# Patient Record
Sex: Female | Born: 1980 | Race: White | Hispanic: No | Smoking: Current every day smoker
Health system: Southern US, Community
[De-identification: ages and names within clinical notes are randomized; demographics above are authoritative.]

## PROBLEM LIST (undated history)

## (undated) DIAGNOSIS — F32A Depression, unspecified: Secondary | ICD-10-CM

## (undated) DIAGNOSIS — Z9289 Personal history of other medical treatment: Secondary | ICD-10-CM

## (undated) DIAGNOSIS — K922 Gastrointestinal hemorrhage, unspecified: Secondary | ICD-10-CM

## (undated) DIAGNOSIS — I10 Essential (primary) hypertension: Secondary | ICD-10-CM

## (undated) DIAGNOSIS — R519 Headache, unspecified: Secondary | ICD-10-CM

## (undated) DIAGNOSIS — Z87442 Personal history of urinary calculi: Secondary | ICD-10-CM

## (undated) DIAGNOSIS — R51 Headache: Secondary | ICD-10-CM

## (undated) DIAGNOSIS — K219 Gastro-esophageal reflux disease without esophagitis: Secondary | ICD-10-CM

## (undated) DIAGNOSIS — N3010 Interstitial cystitis (chronic) without hematuria: Secondary | ICD-10-CM

## (undated) DIAGNOSIS — J189 Pneumonia, unspecified organism: Secondary | ICD-10-CM

## (undated) DIAGNOSIS — M545 Low back pain, unspecified: Secondary | ICD-10-CM

## (undated) DIAGNOSIS — F419 Anxiety disorder, unspecified: Secondary | ICD-10-CM

## (undated) DIAGNOSIS — D649 Anemia, unspecified: Secondary | ICD-10-CM

## (undated) DIAGNOSIS — K264 Chronic or unspecified duodenal ulcer with hemorrhage: Secondary | ICD-10-CM

## (undated) DIAGNOSIS — E039 Hypothyroidism, unspecified: Secondary | ICD-10-CM

## (undated) DIAGNOSIS — Z8719 Personal history of other diseases of the digestive system: Secondary | ICD-10-CM

## (undated) DIAGNOSIS — E079 Disorder of thyroid, unspecified: Secondary | ICD-10-CM

## (undated) DIAGNOSIS — G8929 Other chronic pain: Secondary | ICD-10-CM

## (undated) DIAGNOSIS — N200 Calculus of kidney: Secondary | ICD-10-CM

## (undated) DIAGNOSIS — F329 Major depressive disorder, single episode, unspecified: Secondary | ICD-10-CM

## (undated) HISTORY — PX: HERNIA REPAIR: SHX51

## (undated) HISTORY — DX: Chronic or unspecified duodenal ulcer with hemorrhage: K26.4

---
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
## 2001-08-19 ENCOUNTER — Emergency Department (HOSPITAL_COMMUNITY): Admission: EM | Admit: 2001-08-19 | Discharge: 2001-08-19 | Payer: Self-pay | Admitting: Emergency Medicine

## 2001-09-22 ENCOUNTER — Ambulatory Visit (HOSPITAL_COMMUNITY): Admission: RE | Admit: 2001-09-22 | Discharge: 2001-09-22 | Payer: Self-pay | Admitting: Family Medicine

## 2001-09-22 ENCOUNTER — Encounter: Payer: Self-pay | Admitting: Family Medicine

## 2001-12-22 ENCOUNTER — Encounter: Payer: Self-pay | Admitting: *Deleted

## 2001-12-22 ENCOUNTER — Emergency Department (HOSPITAL_COMMUNITY): Admission: EM | Admit: 2001-12-22 | Discharge: 2001-12-22 | Payer: Self-pay | Admitting: Emergency Medicine

## 2002-03-01 HISTORY — PX: LITHOTRIPSY: SUR834

## 2002-03-01 HISTORY — PX: BLADDER INSTILLATION: SUR156

## 2002-08-27 ENCOUNTER — Encounter: Payer: Self-pay | Admitting: Emergency Medicine

## 2002-08-27 ENCOUNTER — Emergency Department (HOSPITAL_COMMUNITY): Admission: EM | Admit: 2002-08-27 | Discharge: 2002-08-27 | Payer: Self-pay | Admitting: Emergency Medicine

## 2002-10-17 ENCOUNTER — Encounter: Payer: Self-pay | Admitting: Urology

## 2002-10-17 ENCOUNTER — Ambulatory Visit (HOSPITAL_COMMUNITY): Admission: RE | Admit: 2002-10-17 | Discharge: 2002-10-17 | Payer: Self-pay | Admitting: Urology

## 2002-11-01 ENCOUNTER — Ambulatory Visit (HOSPITAL_BASED_OUTPATIENT_CLINIC_OR_DEPARTMENT_OTHER): Admission: RE | Admit: 2002-11-01 | Discharge: 2002-11-01 | Payer: Self-pay | Admitting: Urology

## 2002-11-01 ENCOUNTER — Encounter: Payer: Self-pay | Admitting: Urology

## 2003-04-25 ENCOUNTER — Ambulatory Visit (HOSPITAL_COMMUNITY): Admission: RE | Admit: 2003-04-25 | Discharge: 2003-04-25 | Payer: Self-pay | Admitting: Urology

## 2003-04-25 ENCOUNTER — Encounter (INDEPENDENT_AMBULATORY_CARE_PROVIDER_SITE_OTHER): Payer: Self-pay | Admitting: Specialist

## 2003-04-25 ENCOUNTER — Ambulatory Visit (HOSPITAL_BASED_OUTPATIENT_CLINIC_OR_DEPARTMENT_OTHER): Admission: RE | Admit: 2003-04-25 | Discharge: 2003-04-25 | Payer: Self-pay | Admitting: Urology

## 2003-04-28 ENCOUNTER — Emergency Department (HOSPITAL_COMMUNITY): Admission: EM | Admit: 2003-04-28 | Discharge: 2003-04-28 | Payer: Self-pay | Admitting: Emergency Medicine

## 2004-05-11 ENCOUNTER — Ambulatory Visit (HOSPITAL_COMMUNITY): Admission: RE | Admit: 2004-05-11 | Discharge: 2004-05-11 | Payer: Self-pay | Admitting: Obstetrics & Gynecology

## 2004-05-13 ENCOUNTER — Inpatient Hospital Stay (HOSPITAL_COMMUNITY): Admission: RE | Admit: 2004-05-13 | Discharge: 2004-05-15 | Payer: Self-pay | Admitting: Obstetrics & Gynecology

## 2008-05-17 HISTORY — PX: TONSILLECTOMY: SUR1361

## 2008-05-17 HISTORY — PX: NASAL SINUS SURGERY: SHX719

## 2008-06-28 ENCOUNTER — Ambulatory Visit (HOSPITAL_COMMUNITY): Payer: Self-pay | Admitting: Psychology

## 2009-02-27 ENCOUNTER — Emergency Department (HOSPITAL_COMMUNITY): Admission: EM | Admit: 2009-02-27 | Discharge: 2009-02-27 | Payer: Self-pay | Admitting: Emergency Medicine

## 2010-06-01 LAB — URINALYSIS, ROUTINE W REFLEX MICROSCOPIC
Bilirubin Urine: NEGATIVE
Glucose, UA: NEGATIVE mg/dL
Ketones, ur: NEGATIVE mg/dL
Leukocytes, UA: NEGATIVE
Nitrite: NEGATIVE
Protein, ur: NEGATIVE mg/dL
Specific Gravity, Urine: <1.005 — ABNORMAL LOW (ref 1.005–1.030)
Urobilinogen, UA: 0.2 mg/dL (ref 0.0–1.0)
pH: 6.5 (ref 5.0–8.0)

## 2010-06-01 LAB — URINE MICROSCOPIC-ADD ON

## 2010-07-17 NOTE — Discharge Summary (Signed)
NAMERAAGA, MAEDER NO.:  [id number]   MEDICAL RECORD NO.:  [id number]          PATIENT TYPE:  INP   LOCATION:  A427                          FACILITY:  [facility]   PHYSICIAN:  Lazaro Arms, M.D.   DATE OF BIRTH:  05-29-1980   DATE OF ADMISSION:  05/13/2004  DATE OF DISCHARGE:  03/17/2006LH                                 DISCHARGE SUMMARY   DISCHARGE DIAGNOSES:  1.  Status post primary cesarean section.  2.  Unremarkable postoperative course.   PROCEDURE:  Primary cesarean section.   Please refer to the transcribed history and physical as well as the  operative note for details on admission to the hospital.   HOSPITAL COURSE:  The patient was admitted, underwent a primary cesarean  section because of fetal pelvic disproportion. The fetus was not in the  pelvis at all. As a result, we proceeded with that. She had a normal  intraoperative course. Please refer to the operative note for details.  Postoperatively, the patient did well, she tolerated clear liquids and a  regular diet. She voided without symptoms and was ambulatory. Her incision  was clean, dry and intact. She tolerated the transition to oral pain  medicine and had normal bowel function. She was discharged to home on  postoperative day #2 in good stable condition to follow in the office next  week to have staples removed, Steri-Strips placed. She was given  instructions and precautions for return prior to that time as well as a  prescription for  Tylox.      LHE/MEDQ  D:  07/22/2004  T:  07/22/2004  Job:  [id number]

## 2010-07-17 NOTE — Op Note (Signed)
NAMEAUDREY, Kimberly Weiss NO.:  [id number]   MEDICAL RECORD NO.:  [id number]          PATIENT TYPE:  INP   LOCATION:  A427                          FACILITY:  [facility]   PHYSICIAN:  Lazaro Arms, M.D.   DATE OF BIRTH:  Sep 02, 1980   DATE OF PROCEDURE:  05/13/2004  DATE OF DISCHARGE:                                 OPERATIVE REPORT   PREOPERATIVE DIAGNOSES:  1.  Intrauterine pregnancy at [redacted] weeks gestation.  2.  Inadequate pelvis.  3.  Preoperative concern over fetal lip and palate clefting.   POSTOPERATIVE DIAGNOSIS:  1.  Intrauterine pregnancy at [redacted] weeks gestation.  2.  Inadequate pelvis.  3.  Preoperative concern over fetal lip and palate clefting.  No evidence of      any soft tissue or palatal clefting.   OPERATION:  Primary cesarean section.   SURGEON:  Lazaro Arms, M.D.   ANESTHESIA:  Spinal.   FINDINGS:  Delivered a viable female infant with Apgars of 9 and 9, weighing 7  pounds, 13 ounces.  Did have a 14-1/2 inch head circumference that was  probably the cause of it not fitting down into the pelvis.  There was no  clefting at the time of delivery.  The uterus, tubes, and ovaries were  normal.  Placenta was normal.  Cord gas and cord blood were sent.  Dr. Milford Cage  was in attendance for routine neonatal resuscitation.   DESCRIPTION OF PROCEDURE:  Patient was taken to the operating room and  placed in a sitting position, where she underwent a spinal anesthetic.  She  was then placed in a supine position, prepped and draped in the usual  sterile fashion.  A Pfannenstiel skin incision was made and carried down  sharply to the rectus fascia, which was scored in the midline and extended  laterally.  The fascia was taken off the muscle superiorly and inferiorly  without difficulty.  The muscles were divided, and the peritoneal cavity was  entered.  A bladder blade was placed.  A vesicouterine serosal flap was  created.  A low transverse hysterotomy incision was  made, and over this  incision was delivered a viable female infant with Apgars of 9 and 9.  Weight  7 pounds 12 ounces.  There was a three-vessel cord.  Cord blood and cord gas  was sent.  The infant was handed to Dr. Milford Cage, who was in attendance for  routine neonatal resuscitation.  Cord blood and cord gas was sent.  The  placenta was delivered spontaneously.  The uterus was closed in two layers,  the first being running interlocking layer, the second being an embrocating  layer.  The uterus clamped down well with Pitocin and massage.  The uterus  was replaced.  The peritoneal cavity and both pericolic gutters were  irrigated using warm normal saline.  The muscles were reapproximated  loosely.  The fascia closed using 0  Vicryl running.  The subcutaneous tissue was made hemostatic and irrigated.  The skin was closed using skin staples.  The patient tolerated the procedure  well.  She experienced 500 cc of blood loss and was taken  to the recovery  room in good and stable condition.  All counts were correct x3.      LHE/MEDQ  D:  05/13/2004  T:  05/13/2004  Job:  [id number]

## 2010-07-17 NOTE — Op Note (Signed)
Kimberly Weiss, Kimberly Weiss                       ACCOUNT NO.:  [id number]   MEDICAL RECORD NO.:  [id number]                   PATIENT TYPE:  AMB   LOCATION:  NESC                                 FACILITY:  [facility]   PHYSICIAN:  Sigmund I. Patsi Sears, M.D.         DATE OF BIRTH:  1980-08-09   DATE OF PROCEDURE:  04/25/2003  DATE OF DISCHARGE:                                 OPERATIVE REPORT   PREOPERATIVE DIAGNOSIS:  Interstitial cystitis with left flank pain.   POSTOPERATIVE DIAGNOSIS:  Interstitial cystitis with left flank pain.   OPERATION:  1. Cystourethroscopy.  2. Bilateral retrograde pyelogram with interpretation.  3. Hydrodistention of the bladder (650-700 mL).  4. Cold cup biopsy of the bladder.  5. Cauterization of biopsy sites.  6. Insertion of Clorpactin for three minutes.  7. Insertion of Marcaine and Pyridium.  8. Injection of Marcaine and Kenalog in the subtrigonal space.   SURGEON:  Sigmund I. Patsi Sears, M.D.   ANESTHESIA:  General LMA.   PREPARATION:  After appropriate preanesthesia, the patient was brought to  the operating room and placed on the operating table in the dorsal supine  position, where general LMA was introduced.  She was then replaced in a  dorsal lithotomy position, where the pubis was prepped with Betadine  solution and draped in the usual fashion.   REVIEW OF HISTORY:  This 30 year old female complains of left flank pain,  with a history of a 5 mm left renal stone post lithotripsy.  The patient was  placed on suppression, trimethoprim, for recurrent infections and complains  of recurrent left flank pain, suprapubic tenderness, with urgency and  frequency.  There is no definite evidence that the patient has had  discomfort with intercourse but does not relate that to her symptoms.  She  is now for cystoscopy, HOD, bladder biopsy to rule out IC.   DESCRIPTION OF PROCEDURE:  Cystourethroscopy revealed a normal-appearing  urethra.  The bladder  base was normal, and there was no evidence of bladder  stone, tumor, or diverticular formation.   The bladder held only 650 mL under anesthesia, and then this was  hydrodistended to 700 mL.  Following this, bilateral retrograde pyelograms  were performed which were normal.  No evidence of stone, tumor, or  hydronephrosis.   Biopsies of the bladder were taken, and each biopsy site was cauterized.  Clorpactin was inserted in the bladder for three minutes and drained.  Marcaine and Pyridium was then inserted in the bladder and left in the  bladder.  Marcaine and Kenalog was then injected in the subtrigonal space.  The patient was then awakened and taken to the recovery room in good  condition.  Sigmund I. Patsi Sears, M.D.    SIT/MEDQ  D:  04/25/2003  T:  04/25/2003  Job:  [id number]

## 2011-06-09 ENCOUNTER — Other Ambulatory Visit: Payer: Self-pay | Admitting: Family Medicine

## 2011-06-09 DIAGNOSIS — M545 Low back pain, unspecified: Secondary | ICD-10-CM

## 2011-06-15 ENCOUNTER — Other Ambulatory Visit (HOSPITAL_COMMUNITY): Payer: Self-pay

## 2011-12-12 ENCOUNTER — Encounter (HOSPITAL_COMMUNITY): Payer: Self-pay | Admitting: Adult Health

## 2011-12-12 ENCOUNTER — Emergency Department (HOSPITAL_COMMUNITY)
Admission: EM | Admit: 2011-12-12 | Discharge: 2011-12-12 | Disposition: A | Discharge status: Home / Self Care | Payer: Medicaid Other | Attending: Emergency Medicine | Admitting: Emergency Medicine

## 2011-12-12 DIAGNOSIS — M79609 Pain in unspecified limb: Secondary | ICD-10-CM | POA: Insufficient documentation

## 2011-12-12 DIAGNOSIS — M545 Low back pain, unspecified: Secondary | ICD-10-CM | POA: Insufficient documentation

## 2011-12-12 DIAGNOSIS — N289 Disorder of kidney and ureter, unspecified: Secondary | ICD-10-CM | POA: Insufficient documentation

## 2011-12-12 DIAGNOSIS — Z79899 Other long term (current) drug therapy: Secondary | ICD-10-CM | POA: Insufficient documentation

## 2011-12-12 DIAGNOSIS — M549 Dorsalgia, unspecified: Secondary | ICD-10-CM

## 2011-12-12 DIAGNOSIS — E039 Hypothyroidism, unspecified: Secondary | ICD-10-CM | POA: Insufficient documentation

## 2011-12-12 DIAGNOSIS — F17200 Nicotine dependence, unspecified, uncomplicated: Secondary | ICD-10-CM | POA: Insufficient documentation

## 2011-12-12 HISTORY — DX: Interstitial cystitis (chronic) without hematuria: N30.10

## 2011-12-12 HISTORY — DX: Disorder of thyroid, unspecified: E07.9

## 2011-12-12 MED ORDER — HYDROMORPHONE HCL PF 1 MG/ML IJ SOLN
1.0000 mg | Freq: Once | INTRAMUSCULAR | Status: AC
  Administered 2011-12-12: 1 mg via INTRAMUSCULAR
  Filled 2011-12-12: qty 1

## 2011-12-12 MED ORDER — DIAZEPAM 5 MG PO TABS: 5.0000 mg | ORAL_TABLET | Freq: Four times a day (QID) | ORAL | Status: DC | PRN

## 2011-12-12 MED ORDER — HYDROCODONE-ACETAMINOPHEN 5-325 MG PO TABS: 1.0000 | ORAL_TABLET | ORAL | Status: DC | PRN

## 2011-12-12 MED ORDER — DIAZEPAM 5 MG/ML IJ SOLN
10.0000 mg | Freq: Once | INTRAMUSCULAR | Status: AC
  Administered 2011-12-12: 10 mg via INTRAMUSCULAR
  Filled 2011-12-12: qty 2

## 2011-12-12 NOTE — ED Notes (Signed)
The pt has chronic back pain and since Friday she has had more  Back pain then earlier tonight she lifted her 32 year old son upright and since then she has had a burning pain  In her lower  Spine.  Pain with standing and sitting

## 2011-12-12 NOTE — ED Notes (Signed)
C/o back pain that has been ongoing since march. Has been seen for it by Primary care doctor. Tonight she picked up her 32 year old like a baby because he was hurt, after picking child up c/o feeling a tear and ripping sensation in lower back. Pain is worse with movement and ambulation. Nothing makes pain better.  Pain radiates down back and up back. C/o inability to stand up with the pain.

## 2011-12-12 NOTE — ED Provider Notes (Signed)
History     CSN: [id number]  Arrival date & time 12/12/11  5621   First MD Initiated Contact with Patient 12/12/11 0100      Chief Complaint  Patient presents with  . Back Pain   HPI  History provided by the patient. Patient is a 31 year old female who presents with complaints of worsening low back pain. Patient states that she has had off-and-on low back pain beginning earlier this year. She reports she has had severe episodes of back pain that radiates into her left extremity and cause difficulty moving around. Patient is seen have been a practice in [place] and states that she has been seen multiple times for these symptoms. She has been treated with pain medications for her symptoms and muscle relaxants. She states her primary care provider was unable to perform MRI because of her insurance but that they are planning and hoping to perform this in the future. She has not been referred to any back or spine specialist. Patient states that she jumped up off the couch earlier in the evening last night to help pick up her 32-year-old son and states she felt a tearing pain in her low back. This has reproduced her severe pain symptoms with radiation into her left buttocks and thigh. She denies any numbness or weakness in lower extremities. Denies any urinary or fecal incontinence, urinary retention or perineal numbness. Patient has not used anything to treat her symptoms. Patient states she was previously on oxycodone but rather this medication. She was also put on gabapentin but did not like the side effects from this medicine.    Past Medical History  Diagnosis Date  . Renal disorder   . Interstitial cystitis   . Thyroid disease     History reviewed. No pertinent past surgical history.  History reviewed. No pertinent family history.  History  Substance Use Topics  . Smoking status: Current Every Day Smoker  . Smokeless tobacco: Not on file  . Alcohol Use: Yes    OB History    Grav Para Term Preterm Abortions TAB SAB Ect Mult Living                  Review of Systems  Constitutional: Negative for fever and unexpected weight change.  HENT: Negative for neck pain.   Respiratory: Negative for shortness of breath.   Cardiovascular: Negative for chest pain.  Genitourinary: Negative for dysuria, frequency, hematuria and flank pain.  Musculoskeletal: Positive for back pain.  Neurological: Negative for weakness and numbness.    Allergies  Review of patient's allergies indicates no known allergies.  Home Medications   Current Outpatient Rx  Name Route Sig Dispense Refill  . AMITRIPTYLINE HCL 50 MG PO TABS Oral Take 50 mg by mouth at bedtime.    Marland Kitchen EXCEDRIN PO Oral Take 2 tablets by mouth 2 (two) times daily as needed. For pain    . CLONAZEPAM 1 MG PO TABS Oral Take 1 mg by mouth 3 (three) times daily as needed. For panic attacks    . DULOXETINE HCL 60 MG PO CPEP Oral Take 60 mg by mouth daily.    Marland Kitchen GABAPENTIN 300 MG PO CAPS Oral Take 300 mg by mouth daily as needed. For pain    . LEVOTHYROXINE SODIUM 50 MCG PO TABS Oral Take 50 mcg by mouth daily.    Marland Kitchen LORATADINE 10 MG PO TABS Oral Take 10 mg by mouth daily as needed. For seasonal allergy symptoms  BP 119/75  Temp 98.1 F (36.7 C) (Oral)  Resp 18  SpO2 98% heart rate 104  Physical Exam  Nursing note and vitals reviewed. Constitutional: She is oriented to person, place, and time. She appears well-developed and well-nourished. No distress.  HENT:  Head: Normocephalic.  Neck: Normal range of motion. Neck supple.  Cardiovascular: Normal rate and regular rhythm.   Pulmonary/Chest: Effort normal and breath sounds normal. No respiratory distress. She has no wheezes. She has no rales.  Abdominal: Soft. There is no tenderness. There is no rebound.       No CVA tenderness  Musculoskeletal:       Lumbar back: She exhibits decreased range of motion and tenderness. She exhibits no bony tenderness, no swelling,  no edema and no deformity.       Back:       Pain with forward flexion. Patient also has some difficulty with other range of motion.  Neurological: She is alert and oriented to person, place, and time. She has normal strength. No sensory deficit. Gait normal.  Skin: Skin is warm and dry. No rash noted.  Psychiatric: She has a normal mood and affect. Her behavior is normal.    ED Course  Procedures       1. Back pain       MDM  1:40 AM patient seen and evaluated. Patient appears uncomfortable but in no acute distress. No history of significant injury. No red flag symptoms. No indications for imaging tonight.       Angus Seller, [place] 12/12/11 2202

## 2011-12-13 NOTE — ED Provider Notes (Signed)
Medical screening examination/treatment/procedure(s) were performed by non-physician practitioner and as supervising physician I was immediately available for consultation/collaboration.  Olivia Mackie, MD 12/13/11 [phone]

## 2012-01-18 ENCOUNTER — Other Ambulatory Visit (HOSPITAL_COMMUNITY): Payer: Self-pay | Admitting: Neurosurgery

## 2012-01-18 DIAGNOSIS — M545 Low back pain, unspecified: Secondary | ICD-10-CM

## 2012-01-20 ENCOUNTER — Ambulatory Visit (HOSPITAL_COMMUNITY)
Admission: RE | Admit: 2012-01-20 | Discharge: 2012-01-20 | Disposition: A | Discharge status: Home / Self Care | Payer: Medicaid Other | Source: Ambulatory Visit | Attending: Neurosurgery | Admitting: Neurosurgery

## 2012-01-20 DIAGNOSIS — M545 Low back pain, unspecified: Secondary | ICD-10-CM

## 2012-01-20 DIAGNOSIS — M4310 Spondylolisthesis, site unspecified: Secondary | ICD-10-CM | POA: Insufficient documentation

## 2012-01-20 DIAGNOSIS — M47817 Spondylosis without myelopathy or radiculopathy, lumbosacral region: Secondary | ICD-10-CM | POA: Insufficient documentation

## 2012-05-21 ENCOUNTER — Other Ambulatory Visit: Payer: Self-pay | Admitting: Nurse Practitioner

## 2012-05-22 ENCOUNTER — Encounter: Payer: Self-pay | Admitting: *Deleted

## 2012-05-22 DIAGNOSIS — E038 Other specified hypothyroidism: Secondary | ICD-10-CM

## 2012-05-22 DIAGNOSIS — N3010 Interstitial cystitis (chronic) without hematuria: Secondary | ICD-10-CM | POA: Insufficient documentation

## 2012-05-22 DIAGNOSIS — E039 Hypothyroidism, unspecified: Secondary | ICD-10-CM | POA: Insufficient documentation

## 2012-06-08 ENCOUNTER — Other Ambulatory Visit: Payer: Self-pay | Admitting: Nurse Practitioner

## 2012-06-08 ENCOUNTER — Encounter: Payer: Self-pay | Admitting: Nurse Practitioner

## 2012-06-08 ENCOUNTER — Ambulatory Visit (INDEPENDENT_AMBULATORY_CARE_PROVIDER_SITE_OTHER): Payer: Medicaid Other | Admitting: Nurse Practitioner

## 2012-06-08 VITALS — BP 124/80 | Temp 98.7°F | Ht 63.0 in | Wt 160.3 lb

## 2012-06-08 DIAGNOSIS — N949 Unspecified condition associated with female genital organs and menstrual cycle: Secondary | ICD-10-CM

## 2012-06-08 DIAGNOSIS — N925 Other specified irregular menstruation: Secondary | ICD-10-CM

## 2012-06-08 DIAGNOSIS — N938 Other specified abnormal uterine and vaginal bleeding: Secondary | ICD-10-CM

## 2012-06-08 LAB — POCT HEMOGLOBIN: Hemoglobin: 13.2 g/dL (ref 12.2–16.2)

## 2012-06-08 MED ORDER — BACLOFEN 10 MG PO TABS: 10.0000 mg | ORAL_TABLET | Freq: Three times a day (TID) | ORAL | Status: DC

## 2012-06-08 MED ORDER — HYDROCODONE-ACETAMINOPHEN 10-325 MG PO TABS: 1.0000 | ORAL_TABLET | Freq: Four times a day (QID) | ORAL | Status: DC | PRN

## 2012-06-08 MED ORDER — MEGESTROL ACETATE 40 MG PO TABS: ORAL_TABLET | ORAL | Status: DC

## 2012-06-10 ENCOUNTER — Encounter: Payer: Self-pay | Admitting: Nurse Practitioner

## 2012-06-10 DIAGNOSIS — N938 Other specified abnormal uterine and vaginal bleeding: Secondary | ICD-10-CM | POA: Insufficient documentation

## 2012-06-10 NOTE — Assessment & Plan Note (Signed)
OTC ibuprofen as directed. Also prescribed baclofen and Megace. Call back next week if bleeding persists. Also refill on her hydrocodone/APAP as directed. Patient to notify our office if she wishes to switch her from her birth control with her next Depo-Provera injection.

## 2012-06-10 NOTE — Progress Notes (Signed)
Subjective:  Presents with complaints of breakthrough bleeding on Depo-Provera. First week she had brown spotting. Second week some bright red bleeding with cramping and bleeding. This has improved. No vaginal discharge. No fevers. Same sexual partner. This was her first injection of Depo-Provera, next dose due early May.  Objective:   BP 124/80  Temp(Src) 98.7 F (37.1 C) (Oral)  Ht 5\' 3"  (1.6 m)  Wt 160 lb 5 oz (72.717 kg)  BMI 28.41 kg/m2 NAD. Alert, oriented. Lungs clear. Heart regular rate rhythm. Hemoglobin 13.2.

## 2012-06-12 ENCOUNTER — Telehealth: Payer: Self-pay | Admitting: Family Medicine

## 2012-06-12 NOTE — Telephone Encounter (Signed)
Please verify dosage etc.on paper chart.  Also we do not prescribe 90 d supply on controlled meds.

## 2012-06-13 ENCOUNTER — Other Ambulatory Visit: Payer: Self-pay | Admitting: Family Medicine

## 2012-06-13 NOTE — Telephone Encounter (Signed)
Last recorded dosage in paper chart was on 12/03/2011 for Klonopin 1 mg #90 one TID prn

## 2012-06-15 ENCOUNTER — Other Ambulatory Visit: Payer: Self-pay | Admitting: Family Medicine

## 2012-06-16 ENCOUNTER — Other Ambulatory Visit: Payer: Self-pay | Admitting: Nurse Practitioner

## 2012-06-16 MED ORDER — AMITRIPTYLINE HCL 25 MG PO TABS: 50.0000 mg | ORAL_TABLET | Freq: Every day | ORAL | Status: DC

## 2012-06-16 MED ORDER — CLONAZEPAM 1 MG PO TABS: ORAL_TABLET | ORAL | Status: DC

## 2012-06-20 ENCOUNTER — Other Ambulatory Visit: Payer: Self-pay | Admitting: Family Medicine

## 2012-06-29 ENCOUNTER — Telehealth: Payer: Self-pay | Admitting: Family Medicine

## 2012-06-29 MED ORDER — MEGESTROL ACETATE 40 MG PO TABS: 40.0000 mg | ORAL_TABLET | Freq: Every day | ORAL | Status: DC

## 2012-06-29 NOTE — Telephone Encounter (Signed)
Pt is requesting a "fluid" pill to help with the water weight she has. Megace has been ordered, app made for depo provera shot on weds or thurs next week

## 2012-06-29 NOTE — Telephone Encounter (Signed)
Seen in office 04/19/12 for vaginal bleeding

## 2012-06-29 NOTE — Telephone Encounter (Signed)
Patient would like to speak with you about the bleeding that she is having. The megestrol stops the bleeding, but starts bleeding again when she is off.

## 2012-06-29 NOTE — Telephone Encounter (Signed)
Restart Megace 40 mg qd x one week.  If she wants to try Depo Provera, recommend getting it next wed-fri.  If not, will need to choose another option.  Not unusual to see bleeding 1st injection.

## 2012-06-30 ENCOUNTER — Other Ambulatory Visit: Payer: Self-pay | Admitting: Nurse Practitioner

## 2012-06-30 MED ORDER — HYDROCHLOROTHIAZIDE 25 MG PO TABS: ORAL_TABLET | ORAL | Status: DC

## 2012-07-05 ENCOUNTER — Ambulatory Visit (INDEPENDENT_AMBULATORY_CARE_PROVIDER_SITE_OTHER): Payer: Medicaid Other

## 2012-07-05 DIAGNOSIS — Z3049 Encounter for surveillance of other contraceptives: Secondary | ICD-10-CM

## 2012-07-05 DIAGNOSIS — Z3042 Encounter for surveillance of injectable contraceptive: Secondary | ICD-10-CM

## 2012-07-05 MED ORDER — MEDROXYPROGESTERONE ACETATE 150 MG/ML IM SUSP
150.0000 mg | Freq: Once | INTRAMUSCULAR | Status: AC
  Administered 2012-07-05: 150 mg via INTRAMUSCULAR

## 2012-07-26 ENCOUNTER — Telehealth: Payer: Self-pay | Admitting: Nurse Practitioner

## 2012-07-26 NOTE — Telephone Encounter (Signed)
Last Depo shot was 07/05/12.  Having trouble with constant bleeding, sometimes its heavy other days it is light.  Does not want the last medication that was given to her it makes her sick.  Wants to know if you can do a hormone panel or some type of blood work to see what may be going on with patient.  Please give patient call.

## 2012-07-27 ENCOUNTER — Telehealth: Payer: Self-pay | Admitting: *Deleted

## 2012-07-27 ENCOUNTER — Other Ambulatory Visit: Payer: Self-pay | Admitting: Nurse Practitioner

## 2012-07-27 NOTE — Telephone Encounter (Signed)
This can happen with Depo Provera as we discussed, especially when you first start taking it.  I will send in a different medicine to see if this will help.  Labwork really is of limited benefit at this time if you are checking hormones.  Women should come off hormones for a month (including pills, etc) before hormone levels are checked.

## 2012-07-27 NOTE — Telephone Encounter (Signed)
Pt has had Depo Provera injection May 7th, 2014.

## 2012-07-27 NOTE — Telephone Encounter (Signed)
I reviewed our last visit.  It looks like patient did not get Depo in early May.  Please verify what she is taking now.  If no hormones, we can order labs.

## 2012-07-27 NOTE — Telephone Encounter (Signed)
Pt stated she had gotten to depo provera shot Jul 05, 2012

## 2012-07-31 ENCOUNTER — Other Ambulatory Visit: Payer: Self-pay | Admitting: Nurse Practitioner

## 2012-07-31 MED ORDER — ESTRADIOL 1 MG PO TABS: 1.0000 mg | ORAL_TABLET | Freq: Every day | ORAL | Status: DC

## 2012-07-31 NOTE — Telephone Encounter (Signed)
Kimberly Weiss, I don't understand last note on this patient.  Please clarify.  Kimberly Weiss

## 2012-07-31 NOTE — Telephone Encounter (Signed)
Sent in Rx for low dose estrogen to [facility].  Bleeding should stop within the next 2 weeks.  Call back if worsens or persists.

## 2012-07-31 NOTE — Telephone Encounter (Signed)
See other message- Message done

## 2012-08-01 ENCOUNTER — Telehealth: Payer: Self-pay

## 2012-08-01 NOTE — Telephone Encounter (Signed)
Notified patient that low dose estrogen was sent in to [facility]. Bleeding should stop in two weeks. Call back if symptoms worsen or persist. Patient verbalized understanding.

## 2012-08-08 ENCOUNTER — Telehealth: Payer: Self-pay | Admitting: Family Medicine

## 2012-08-08 NOTE — Telephone Encounter (Signed)
Patient may have one refill but it is necessary for her to schedule for a followup office visit in July before further refills will be issued.

## 2012-08-08 NOTE — Telephone Encounter (Signed)
RX called into [facility]. Patient notified and to schedule office visit.

## 2012-08-08 NOTE — Telephone Encounter (Signed)
Would like a prescription for pain medication Vicodin 10/325.  [facility] in [place].  Please call patient.  Thanks

## 2012-08-16 ENCOUNTER — Other Ambulatory Visit: Payer: Self-pay | Admitting: Nurse Practitioner

## 2012-08-17 ENCOUNTER — Other Ambulatory Visit: Payer: Self-pay | Admitting: *Deleted

## 2012-08-17 ENCOUNTER — Other Ambulatory Visit: Payer: Self-pay | Admitting: Family Medicine

## 2012-08-17 MED ORDER — LEVOTHYROXINE SODIUM 50 MCG PO TABS: 50.0000 ug | ORAL_TABLET | Freq: Every day | ORAL | Status: DC

## 2012-09-07 ENCOUNTER — Ambulatory Visit: Payer: Medicaid Other | Admitting: Family Medicine

## 2012-09-14 ENCOUNTER — Other Ambulatory Visit: Payer: Self-pay | Admitting: Family Medicine

## 2012-10-02 DIAGNOSIS — Z0289 Encounter for other administrative examinations: Secondary | ICD-10-CM

## 2012-10-23 ENCOUNTER — Telehealth: Payer: Self-pay | Admitting: Family Medicine

## 2012-10-23 ENCOUNTER — Other Ambulatory Visit: Payer: Self-pay | Admitting: *Deleted

## 2012-10-23 MED ORDER — CLONAZEPAM 1 MG PO TABS: ORAL_TABLET | ORAL | Status: DC

## 2012-10-23 NOTE — Telephone Encounter (Signed)
One refill, keep ov

## 2012-10-23 NOTE — Telephone Encounter (Signed)
Med called in and pt notified 

## 2012-10-23 NOTE — Telephone Encounter (Signed)
Patient needs Rx for generic klonopin desperately [id number]. She has an appointment on 10/31/12. Please call patient.

## 2012-10-31 ENCOUNTER — Ambulatory Visit: Payer: Medicaid Other | Admitting: Family Medicine

## 2012-11-02 ENCOUNTER — Encounter: Payer: Self-pay | Admitting: Nurse Practitioner

## 2012-11-02 ENCOUNTER — Ambulatory Visit (INDEPENDENT_AMBULATORY_CARE_PROVIDER_SITE_OTHER): Payer: Medicaid Other | Admitting: Nurse Practitioner

## 2012-11-02 VITALS — BP 100/72 | Ht 63.0 in | Wt 164.0 lb

## 2012-11-02 DIAGNOSIS — F329 Major depressive disorder, single episode, unspecified: Secondary | ICD-10-CM

## 2012-11-02 DIAGNOSIS — F419 Anxiety disorder, unspecified: Secondary | ICD-10-CM

## 2012-11-02 DIAGNOSIS — R52 Pain, unspecified: Secondary | ICD-10-CM

## 2012-11-02 DIAGNOSIS — F32A Anxiety disorder, unspecified: Secondary | ICD-10-CM

## 2012-11-02 DIAGNOSIS — F341 Dysthymic disorder: Secondary | ICD-10-CM

## 2012-11-02 DIAGNOSIS — Z5189 Encounter for other specified aftercare: Secondary | ICD-10-CM

## 2012-11-02 MED ORDER — CLONAZEPAM 1 MG PO TABS: ORAL_TABLET | ORAL | Status: DC

## 2012-11-02 NOTE — Progress Notes (Signed)
Subjective:  Presents for routine followup. Is currently seeing Dr. Venetia Maxon for her chronic back pain. Was prescribed hydrocodone by our office. Switched to oxycodone by their office. Currently on Percocet 10/325 every 6 hours for pain. Patient states she has to take it every 4 hours to control her pain by Dr. Venetia Maxon will not increase in number given per day. Her next visit is scheduled for 9/15 and she is going to be referred to a pain specialist at that time. Continues to have tremendous anxiety. Would like to continue her Klonopin at current dose. Compliant with medications. Our nurse called her current pharmacy patient received 120 Percocet on 8/11.  Objective:   BP 100/72  Ht 5\' 3"  (1.6 m)  Wt 164 lb (74.39 kg)  BMI 29.06 kg/m2 NAD. Alert, oriented. Mild agitation noted. Lungs clear. Heart regular rate rhythm.  Assessment:Anxiety and depression  chronic pain management  Plan: Meds ordered this encounter  Medications  . DISCONTD: clonazePAM (KLONOPIN) 1 MG tablet    Sig: 1/2-1 tab po TID prn panic attacks    Dispense:  90 tablet    Refill:  2    May refill monthly    Order Specific Question:  Supervising Provider    Answer:  Merlyn Albert [[id number]]  . DISCONTD: clonazePAM (KLONOPIN) 1 MG tablet    Sig: 1/2-1 tab po TID prn panic attacks    Dispense:  90 tablet    Refill:  2    May refill monthly    Order Specific Question:  Supervising Provider    Answer:  Merlyn Albert [[id number]]  . clonazePAM (KLONOPIN) 1 MG tablet    Sig: 1/2-1 tab po TID prn panic attacks    Dispense:  90 tablet    Refill:  2    May refill monthly    Order Specific Question:  Supervising Provider    Answer:  Merlyn Albert [[id number]]   Continue other medications as directed. Patient requesting Percocet to last her until 9/15, only has 3 pills left. Has been taking it every 4 hours despite instructions from her specialist. Explained patient that she will need to followup with her specialist regarding her pain  medication. Encouraged her to see pain specialist as planned. Recheck here in 3 months.  **Note: Several attempts were made to print Klonopin Rx; We finally printed one which was given to patient.

## 2012-11-03 ENCOUNTER — Telehealth: Payer: Self-pay | Admitting: Family Medicine

## 2012-11-03 DIAGNOSIS — F419 Anxiety disorder, unspecified: Secondary | ICD-10-CM | POA: Insufficient documentation

## 2012-11-03 DIAGNOSIS — F329 Major depressive disorder, single episode, unspecified: Secondary | ICD-10-CM | POA: Insufficient documentation

## 2012-11-03 DIAGNOSIS — F32A Anxiety disorder, unspecified: Secondary | ICD-10-CM | POA: Insufficient documentation

## 2012-11-03 NOTE — Telephone Encounter (Signed)
Currently on Baclofen for muscle cramps.

## 2012-11-03 NOTE — Assessment & Plan Note (Signed)
Continue Cymbalta as directed. Continue Klonopin as directed. Recheck in 3 months.

## 2012-11-03 NOTE — Telephone Encounter (Signed)
Patient says that since she can't have a refill on her pain medication, she is calling to find out if there is any way we can prescribe her a really strong muscle relaxer to ease her back.     [facility] [place]

## 2012-11-04 ENCOUNTER — Other Ambulatory Visit: Payer: Self-pay | Admitting: Nurse Practitioner

## 2012-11-04 MED ORDER — METHOCARBAMOL 750 MG PO TABS: 750.0000 mg | ORAL_TABLET | Freq: Three times a day (TID) | ORAL | Status: DC

## 2012-11-04 NOTE — Telephone Encounter (Signed)
Hold on Baclofen.  Will send in new Rx for muscle relaxant on Saturday.

## 2012-11-16 ENCOUNTER — Ambulatory Visit (INDEPENDENT_AMBULATORY_CARE_PROVIDER_SITE_OTHER): Payer: Medicaid Other | Admitting: Family Medicine

## 2012-11-16 ENCOUNTER — Encounter: Payer: Self-pay | Admitting: Family Medicine

## 2012-11-16 VITALS — BP 124/84 | Temp 98.8°F | Ht 63.0 in | Wt 170.0 lb

## 2012-11-16 DIAGNOSIS — J329 Chronic sinusitis, unspecified: Secondary | ICD-10-CM

## 2012-11-16 DIAGNOSIS — J310 Chronic rhinitis: Secondary | ICD-10-CM

## 2012-11-16 MED ORDER — CEFDINIR 300 MG PO CAPS: 300.0000 mg | ORAL_CAPSULE | Freq: Two times a day (BID) | ORAL | Status: DC

## 2012-11-16 MED ORDER — FLUCONAZOLE 150 MG PO TABS: ORAL_TABLET | ORAL | Status: DC

## 2012-11-16 NOTE — Progress Notes (Signed)
  Subjective:    Patient ID: Kimberly Weiss, female    DOB: 05/04/1980, 32 y.o.   MRN: [id number]  Sinusitis This is a new problem. The current episode started in the past 7 days. The pain is moderate. Associated symptoms include congestion, coughing, headaches, neck pain, sinus pressure, sneezing and a sore throat. Past treatments include acetaminophen. The treatment provided mild relief.   Hx of allergies, not taking meds.  Increased allergy symptoms lately  Feverish and achey  P;us sore throat,  Sinus pressure also, drainage leading to nausea, trouble breathing   Review of Systems  HENT: Positive for congestion, sore throat, sneezing, neck pain and sinus pressure.   Respiratory: Positive for cough.   Neurological: Positive for headaches.       Objective:   Physical Exam Alert mild malaise hydration good. HEENT moderate nasal congestion frontal tenderness neck supple. Lungs clear heart regular rate and rhythm.       Assessment & Plan:  Impression #1 rhinosinusitis plan antibiotics prescribed. Symptomatic care discussed. At end of visit patient stated she might have lost her nerve pill prescription given to her just recently. Review of [name]'s note shows that we gave her a copy. I advised patient to work harder for her missing prescriptions. Today I was seen her for an acute illness only. WSL

## 2012-12-08 ENCOUNTER — Telehealth: Payer: Self-pay | Admitting: Family Medicine

## 2012-12-08 MED ORDER — LEVOTHYROXINE SODIUM 50 MCG PO TABS: 50.0000 ug | ORAL_TABLET | Freq: Every day | ORAL | Status: DC

## 2012-12-08 NOTE — Telephone Encounter (Signed)
Rx sent electronically to pharmacy. Patient notified. 

## 2012-12-08 NOTE — Telephone Encounter (Signed)
Patient needs a refill on levothyroxine (SYNTHROID, LEVOTHROID) 50 MCG tablet [facility]  Please call Patient. Thanks

## 2012-12-11 ENCOUNTER — Other Ambulatory Visit: Payer: Self-pay | Admitting: Nurse Practitioner

## 2012-12-11 NOTE — Telephone Encounter (Signed)
3 refills 

## 2012-12-14 ENCOUNTER — Other Ambulatory Visit: Payer: Self-pay | Admitting: Nurse Practitioner

## 2012-12-14 ENCOUNTER — Telehealth: Payer: Self-pay | Admitting: Nurse Practitioner

## 2012-12-14 ENCOUNTER — Other Ambulatory Visit: Payer: Self-pay

## 2012-12-14 NOTE — Telephone Encounter (Signed)
Scott just refilled medication on 10/13. Was [facility]? If so, this has been done. Thanks.

## 2012-12-14 NOTE — Telephone Encounter (Signed)
Notified patient RX was sent in on 12/11/12 to the pharmacy. Patient stated she will check with her pharmacy.

## 2012-12-14 NOTE — Telephone Encounter (Signed)
Patient says that at the last appointment she had she received refills on klonopin 3x daily, but she states she has misplaced the prescriptions because at the time she did not have the money to get them refilled. She would like a refill of this sent to [facility].

## 2013-01-22 ENCOUNTER — Ambulatory Visit (INDEPENDENT_AMBULATORY_CARE_PROVIDER_SITE_OTHER): Payer: Medicaid Other | Admitting: Family Medicine

## 2013-01-22 ENCOUNTER — Encounter: Payer: Self-pay | Admitting: Family Medicine

## 2013-01-22 VITALS — BP 112/80 | Temp 98.8°F | Ht 63.0 in | Wt 159.2 lb

## 2013-01-22 DIAGNOSIS — J029 Acute pharyngitis, unspecified: Secondary | ICD-10-CM

## 2013-01-22 DIAGNOSIS — J019 Acute sinusitis, unspecified: Secondary | ICD-10-CM

## 2013-01-22 LAB — POCT RAPID STREP A (OFFICE): Rapid Strep A Screen: NEGATIVE

## 2013-01-22 MED ORDER — CEFTRIAXONE SODIUM 1 G IJ SOLR
500.0000 mg | Freq: Once | INTRAMUSCULAR | Status: AC
  Administered 2013-01-22: 500 mg via INTRAMUSCULAR

## 2013-01-22 MED ORDER — FLUCONAZOLE 150 MG PO TABS: ORAL_TABLET | ORAL | Status: DC

## 2013-01-22 MED ORDER — LEVOFLOXACIN 500 MG PO TABS: 500.0000 mg | ORAL_TABLET | Freq: Every day | ORAL | Status: DC

## 2013-01-22 MED ORDER — PROMETHAZINE HCL 25 MG/ML IJ SOLN
25.0000 mg | Freq: Once | INTRAMUSCULAR | Status: AC
  Administered 2013-01-22: 25 mg via INTRAMUSCULAR

## 2013-01-22 MED ORDER — PROMETHAZINE HCL 25 MG PO TABS: 25.0000 mg | ORAL_TABLET | Freq: Four times a day (QID) | ORAL | Status: DC | PRN

## 2013-01-22 NOTE — Progress Notes (Signed)
  Subjective:    Patient ID: Kimberly Weiss, female    DOB: 1980/07/09, 32 y.o.   MRN: [id number]  Sore Throat  This is a new problem. The current episode started in the past 7 days. The problem has been unchanged. Neither side of throat is experiencing more pain than the other. There has been no fever. The pain is at a severity of 10/10. The pain is moderate. Associated symptoms include abdominal pain, congestion, coughing, ear pain and neck pain. She has had exposure to strep. She has tried nothing for the symptoms. The treatment provided no relief.   Quit smoking Nov 7th. Past Friday felt worse sore throat, Ha, congestion and ear apin also Nausea and diarrhea    Review of Systems  HENT: Positive for congestion and ear pain.   Respiratory: Positive for cough.   Gastrointestinal: Positive for abdominal pain.  Musculoskeletal: Positive for neck pain.       Objective:   Physical Exam  Nursing note and vitals reviewed. Constitutional: She appears well-developed.  HENT:  Head: Normocephalic.  Nose: Nose normal.  Mouth/Throat: Oropharynx is clear and moist. No oropharyngeal exudate.  Sinus pain  Neck: Neck supple.  Cardiovascular: Normal rate and normal heart sounds.   No murmur heard. Pulmonary/Chest: Effort normal and breath sounds normal. She has no wheezes.  Lymphadenopathy:    She has no cervical adenopathy.  Skin: Skin is warm and dry.          Assessment & Plan:  Sinusitis- antibiotics Warnings discussed Followup if ongoing troubles. Warnings discussed

## 2013-01-23 LAB — STREP A DNA PROBE: GASP: NEGATIVE

## 2013-02-09 ENCOUNTER — Telehealth: Payer: Self-pay | Admitting: Family Medicine

## 2013-02-09 MED ORDER — AMOXICILLIN-POT CLAVULANATE 875-125 MG PO TABS: 1.0000 | ORAL_TABLET | Freq: Two times a day (BID) | ORAL | Status: AC

## 2013-02-09 NOTE — Telephone Encounter (Signed)
Patient notified

## 2013-02-09 NOTE — Telephone Encounter (Signed)
augmentin 875 one bid 10 days with food

## 2013-02-09 NOTE — Telephone Encounter (Signed)
Patient came in last week of November and is still having sinus drainage, nausea, sinus pressure and pain, ear pain and cough. She finished her levaquin, but would like something else called in.  [facility] [address] [facility]

## 2013-03-08 ENCOUNTER — Other Ambulatory Visit: Payer: Self-pay | Admitting: Family Medicine

## 2013-03-13 ENCOUNTER — Other Ambulatory Visit: Payer: Self-pay | Admitting: Family Medicine

## 2013-03-13 ENCOUNTER — Telehealth: Payer: Self-pay | Admitting: Family Medicine

## 2013-03-13 MED ORDER — CLONAZEPAM 1 MG PO TABS: ORAL_TABLET | ORAL | Status: DC

## 2013-03-13 NOTE — Telephone Encounter (Signed)
May give this refill +2 additional refills. Needs office visit for anxiety before further refills.

## 2013-03-13 NOTE — Telephone Encounter (Signed)
Patient notified refill request was approved and script will be faxed to pharmacy today.

## 2013-03-13 NOTE — Telephone Encounter (Signed)
Last office visit 01/22/13 (sick)

## 2013-03-13 NOTE — Telephone Encounter (Signed)
Patient needs Rx for klonopin 3 times a day 90 tabs.   [facility] [address]

## 2013-04-16 DIAGNOSIS — Z0289 Encounter for other administrative examinations: Secondary | ICD-10-CM

## 2013-04-19 ENCOUNTER — Telehealth: Payer: Self-pay | Admitting: Family Medicine

## 2013-04-19 NOTE — Telephone Encounter (Signed)
Patient needs Rx for amitriptyline 50 mg(She would like to have to take 1 50 mg pill instead of 2 25 mg pills a day), duloxetine 60 mg, hydrochlorothiazide 25 mg.  She would like a 90 day supply if possible.  [facility] [address]

## 2013-04-20 ENCOUNTER — Other Ambulatory Visit: Payer: Self-pay | Admitting: Family Medicine

## 2013-04-20 MED ORDER — DULOXETINE HCL 60 MG PO CPEP: ORAL_CAPSULE | ORAL | Status: DC

## 2013-04-20 MED ORDER — AMITRIPTYLINE HCL 50 MG PO TABS: 50.0000 mg | ORAL_TABLET | Freq: Every day | ORAL | Status: DC

## 2013-04-20 MED ORDER — HYDROCHLOROTHIAZIDE 25 MG PO TABS: ORAL_TABLET | ORAL | Status: DC

## 2013-04-20 NOTE — Telephone Encounter (Signed)
Refills sent in,needs OV and labs in spring

## 2013-04-20 NOTE — Telephone Encounter (Signed)
Patient notified

## 2013-05-01 ENCOUNTER — Telehealth: Payer: Self-pay | Admitting: Family Medicine

## 2013-05-01 ENCOUNTER — Other Ambulatory Visit: Payer: Self-pay | Admitting: *Deleted

## 2013-05-01 MED ORDER — CYMBALTA 60 MG PO CPEP: 60.0000 mg | ORAL_CAPSULE | Freq: Every day | ORAL | Status: DC

## 2013-05-01 NOTE — Telephone Encounter (Signed)
Nurses will do this

## 2013-05-01 NOTE — Telephone Encounter (Signed)
Pt's plan only pays for name brand CYMBALTA, please [facility] to [facility] ([phone]) for DISPENSE AS WRITTEN, see papers on front of paper chart

## 2013-05-15 ENCOUNTER — Telehealth: Payer: Self-pay | Admitting: Family Medicine

## 2013-05-15 MED ORDER — VENLAFAXINE HCL ER 150 MG PO CP24: 150.0000 mg | ORAL_CAPSULE | Freq: Every day | ORAL | Status: DC

## 2013-05-15 NOTE — Telephone Encounter (Signed)
Patient states she is not having thoughts of harming herself or others-Patient advised to go directly to ER if this occurs- Patient would like to try the Effexor and scheduled a follow up office visit on Monday.

## 2013-05-15 NOTE — Telephone Encounter (Signed)
Patients insurance is no longer covering generic cymbalta and she has been without it for 2 weeks now. She is hoping we can call another anti-depressant in. She said she is going through a tough breakup right now and she was crying on the phone.    [facility] [address]

## 2013-05-15 NOTE — Telephone Encounter (Signed)
Rx sent electronically to pharmacy. Patient notified. Patient scheduled office visit next week to follow up

## 2013-05-15 NOTE — Telephone Encounter (Signed)
D/c cymbalta, next day start Effexor 150 mg XR one qd , #30 , 2 RF, ov nxt week

## 2013-05-15 NOTE — Telephone Encounter (Signed)
Nurse to call, please discuss how the patient is doing. Make sure she is not suicidal. If suicidal she needs to immediately be seen here or ER. Patient needs to be advised that. Also Effexor is a similar medicine that typically works well in these situations we could start that along with his scheduled followup with Eber Jonesarolyn or myself.

## 2013-05-18 ENCOUNTER — Other Ambulatory Visit: Payer: Self-pay | Admitting: Family Medicine

## 2013-05-21 ENCOUNTER — Ambulatory Visit: Payer: Medicaid Other | Admitting: Nurse Practitioner

## 2013-07-19 ENCOUNTER — Ambulatory Visit (INDEPENDENT_AMBULATORY_CARE_PROVIDER_SITE_OTHER): Payer: Medicaid Other | Admitting: Family Medicine

## 2013-07-19 ENCOUNTER — Encounter: Payer: Self-pay | Admitting: Family Medicine

## 2013-07-19 VITALS — BP 110/70 | Temp 98.5°F | Ht 63.0 in | Wt 155.0 lb

## 2013-07-19 DIAGNOSIS — A499 Bacterial infection, unspecified: Secondary | ICD-10-CM

## 2013-07-19 DIAGNOSIS — J329 Chronic sinusitis, unspecified: Secondary | ICD-10-CM

## 2013-07-19 DIAGNOSIS — B9689 Other specified bacterial agents as the cause of diseases classified elsewhere: Secondary | ICD-10-CM

## 2013-07-19 DIAGNOSIS — N760 Acute vaginitis: Secondary | ICD-10-CM

## 2013-07-19 DIAGNOSIS — J310 Chronic rhinitis: Secondary | ICD-10-CM

## 2013-07-19 MED ORDER — METRONIDAZOLE 500 MG PO TABS: 500.0000 mg | ORAL_TABLET | Freq: Two times a day (BID) | ORAL | Status: DC

## 2013-07-19 MED ORDER — CEFPROZIL 500 MG PO TABS: 500.0000 mg | ORAL_TABLET | Freq: Two times a day (BID) | ORAL | Status: DC

## 2013-07-19 MED ORDER — FLUCONAZOLE 150 MG PO TABS: ORAL_TABLET | ORAL | Status: DC

## 2013-07-19 NOTE — Progress Notes (Signed)
   Subjective:    Patient ID: Toy CookeyKatherine N Locascio, female    DOB: 01/29/1981, 33 y.o.   MRN: [id number]  HPI Comments: Vaginal itching, discharge.  This has been going on for a week now.    Sinusitis This is a new problem. Episode onset: Monday. The problem has been gradually worsening since onset. There has been no fever. Associated symptoms include congestion, coughing and sinus pressure. Past treatments include oral decongestants. The treatment provided mild relief.    Bad itching, no sig odor to discharge  No bronchial cong some coughing    Review of Systems  HENT: Positive for congestion and sinus pressure.   Respiratory: Positive for cough.    Minimal dysuria no obvious fever ROS otherwise negative    Objective:   Physical Exam  Alert moderate malaise. H&T moderate his congestion frontal tenderness bronchial cough pharynx normal no wheezes no crackles heart rare rhythm vaginal discharge evident positive for bacterial vaginosis.      Assessment & Plan:  Impression 1 rhinosinusitis #2 pectoral vaginitis plan antibiotics prescribed. Symptomatic care discussed. Warning signs discussed. WSL

## 2013-08-06 DIAGNOSIS — Z0289 Encounter for other administrative examinations: Secondary | ICD-10-CM

## 2013-08-13 ENCOUNTER — Other Ambulatory Visit: Payer: Self-pay | Admitting: Family Medicine

## 2013-08-13 ENCOUNTER — Telehealth: Payer: Self-pay | Admitting: Family Medicine

## 2013-08-13 MED ORDER — NYSTATIN 100000 UNIT/ML MT SUSP: OROMUCOSAL | Status: DC

## 2013-08-13 MED ORDER — TRIAMCINOLONE ACETONIDE 0.1 % EX CREA: TOPICAL_CREAM | CUTANEOUS | Status: DC

## 2013-08-13 MED ORDER — FLUCONAZOLE 200 MG PO TABS: 200.0000 mg | ORAL_TABLET | Freq: Every day | ORAL | Status: DC

## 2013-08-13 NOTE — Telephone Encounter (Signed)
Do diflucan 150 weekly for 3 weeks and otc intravaginal cream (as directed depending on what she buys) ( 3 to 7 days)

## 2013-08-13 NOTE — Telephone Encounter (Signed)
[facility]

## 2013-08-13 NOTE — Telephone Encounter (Signed)
Also having a white film in her mouth.

## 2013-08-13 NOTE — Telephone Encounter (Signed)
Discussed with patient. meds sent to pharm.  

## 2013-08-13 NOTE — Telephone Encounter (Signed)
I would recommend that we switch things up. Diflucan 200 mg one daily for 7 days. Nystatin oral solution 1 teaspoon swish and swallow 4 times a day first 7 days. If ongoing troubles followup. May still use over-the-counter medication for vaginal yeast infection. May also send him prescription for Kenalog cream 60 g apply 3 times a day when necessary for itching to the hands and feet. If ongoing trouble followup.

## 2013-08-13 NOTE — Telephone Encounter (Signed)
Patient was seen on 07/19/13. Was given Cefzil 500 mg BID x 10 days, Flagyl 500 mg BID x 7 days and Diflucan 150 mg

## 2013-08-13 NOTE — Telephone Encounter (Signed)
Pt states she also having itchiness on palms of hands and bottom feet.  No rash.  Started a couple days ago. Can a cream be called in for this

## 2013-08-13 NOTE — Telephone Encounter (Signed)
Pt is calling to state that her yeast infection has not cleared up Was on a double antibiotic for vaginosis and sinus infection  She wants to know if she needs a higher dose of the diflucan And the cream or what ever you would suggest a this point  [facility]    Last seen 5/21

## 2013-08-28 ENCOUNTER — Other Ambulatory Visit: Payer: Self-pay | Admitting: Family Medicine

## 2013-08-30 ENCOUNTER — Encounter: Payer: Self-pay | Admitting: Nurse Practitioner

## 2013-08-30 ENCOUNTER — Ambulatory Visit (INDEPENDENT_AMBULATORY_CARE_PROVIDER_SITE_OTHER): Payer: Medicaid Other | Admitting: Nurse Practitioner

## 2013-08-30 VITALS — BP 112/80 | Temp 98.6°F | Ht 62.0 in | Wt 153.0 lb

## 2013-08-30 DIAGNOSIS — Z113 Encounter for screening for infections with a predominantly sexual mode of transmission: Secondary | ICD-10-CM

## 2013-08-30 DIAGNOSIS — N760 Acute vaginitis: Secondary | ICD-10-CM

## 2013-08-30 MED ORDER — CLONAZEPAM 1 MG PO TABS: ORAL_TABLET | ORAL | Status: DC

## 2013-08-30 MED ORDER — PROMETHAZINE HCL 25 MG PO TABS: 25.0000 mg | ORAL_TABLET | Freq: Four times a day (QID) | ORAL | Status: DC | PRN

## 2013-08-30 MED ORDER — TERCONAZOLE 0.4 % VA CREA: 1.0000 | TOPICAL_CREAM | Freq: Every day | VAGINAL | Status: DC

## 2013-09-01 LAB — GC/CHLAMYDIA PROBE AMP
CT Probe RNA: NEGATIVE
GC Probe RNA: NEGATIVE

## 2013-09-03 ENCOUNTER — Encounter: Payer: Self-pay | Admitting: Nurse Practitioner

## 2013-09-03 LAB — POCT WET PREP WITH KOH
Bacteria Wet Prep HPF POC: NEGATIVE
Clue Cells Wet Prep HPF POC: NEGATIVE
Epithelial Wet Prep HPF POC: POSITIVE
KOH Prep POC: NEGATIVE
RBC Wet Prep HPF POC: NEGATIVE
Trichomonas, UA: NEGATIVE
WBC Wet Prep HPF POC: NEGATIVE
Yeast Wet Prep HPF POC: NEGATIVE
pH: 4.5

## 2013-09-03 NOTE — Progress Notes (Signed)
Subjective:  Presents complaints of vaginal itching for the past few weeks. Clear to cloudy vaginal discharge. No odor. Last new partner was about 4 months ago. Just completed a normal menstrual cycle. No fever. No unusual pelvic pain. Has urinary symptoms at times which she relates to her interstitial cystitis. Has tried Diflucan with minimal relief.  Objective:   BP 112/80  Temp(Src) 98.6 F (37 C) (Oral)  Ht 5\' 2"  (1.575 m)  Wt 153 lb (69.4 kg)  BMI 27.98 kg/m2 NAD. Alert, oriented. Lungs clear. Heart regular rate rhythm. Abdomen soft nondistended nontender. External GU minimal irritation, no lesions. Vagina a small amount of white mucoid discharge, no odor. Cervix normal limit in appearance, no CMT. Bimanual exam minimal midline tenderness, no obvious masses. Wet prep pH 4.5, no WBCs or RBCs noted.  Assessment: Screen for STD (sexually transmitted disease) - Plan: GC/Chlamydia Probe Amp, CANCELED: GC/Chlamydia Probe Amp  Vaginitis and vulvovaginitis - Plan: POCT Wet Prep with KOH  Plan: Meds ordered this encounter  Medications  . clonazePAM (KLONOPIN) 1 MG tablet    Sig: TAKE 1/2 TO 1 TABLET BY MOUTH 3 TIMES DAILY AS NEEDED FOR PANIC ATTACKS    Dispense:  90 tablet    Refill:  2    Order Specific Question:  Supervising Provider    Answer:  Merlyn AlbertLUKING, WILLIAM S [[id number]]  . promethazine (PHENERGAN) 25 MG tablet    Sig: Take 1 tablet (25 mg total) by mouth every 6 (six) hours as needed for nausea.    Dispense:  30 tablet    Refill:  0    Order Specific Question:  Supervising Provider    Answer:  Merlyn AlbertLUKING, WILLIAM S [[id number]]  . terconazole (TERAZOL 7) 0.4 % vaginal cream    Sig: Place 1 applicator vaginally at bedtime. X 7 d    Dispense:  45 g    Refill:  0    Order Specific Question:  Supervising Provider    Answer:  Merlyn AlbertLUKING, WILLIAM S [[id number]]   Refill of Klonopin. Trial of Terazol cream as directed. GC and Chlamydia pending. Discussed safe sex issues. Call back if symptoms worsen or  persist.

## 2013-11-14 ENCOUNTER — Emergency Department (HOSPITAL_COMMUNITY): Payer: Medicaid Other

## 2013-11-14 ENCOUNTER — Emergency Department (HOSPITAL_COMMUNITY)
Admission: EM | Admit: 2013-11-14 | Discharge: 2013-11-15 | Disposition: A | Discharge status: Home / Self Care | Payer: Medicaid Other | Attending: Emergency Medicine | Admitting: Emergency Medicine

## 2013-11-14 ENCOUNTER — Encounter (HOSPITAL_COMMUNITY): Payer: Self-pay | Admitting: Emergency Medicine

## 2013-11-14 DIAGNOSIS — Z3202 Encounter for pregnancy test, result negative: Secondary | ICD-10-CM | POA: Diagnosis not present

## 2013-11-14 DIAGNOSIS — Z87448 Personal history of other diseases of urinary system: Secondary | ICD-10-CM | POA: Insufficient documentation

## 2013-11-14 DIAGNOSIS — R1011 Right upper quadrant pain: Secondary | ICD-10-CM

## 2013-11-14 DIAGNOSIS — Z79899 Other long term (current) drug therapy: Secondary | ICD-10-CM | POA: Insufficient documentation

## 2013-11-14 DIAGNOSIS — Z8719 Personal history of other diseases of the digestive system: Secondary | ICD-10-CM | POA: Diagnosis not present

## 2013-11-14 DIAGNOSIS — E079 Disorder of thyroid, unspecified: Secondary | ICD-10-CM | POA: Diagnosis not present

## 2013-11-14 DIAGNOSIS — F17200 Nicotine dependence, unspecified, uncomplicated: Secondary | ICD-10-CM | POA: Insufficient documentation

## 2013-11-14 LAB — CBC WITH DIFFERENTIAL/PLATELET
Basophils Absolute: 0 10*3/uL (ref 0.0–0.1)
Basophils Relative: 0 % (ref 0–1)
Eosinophils Absolute: 0.2 10*3/uL (ref 0.0–0.7)
Eosinophils Relative: 2 % (ref 0–5)
HCT: 42.7 % (ref 36.0–46.0)
Hemoglobin: 14.2 g/dL (ref 12.0–15.0)
Lymphocytes Relative: 24 % (ref 12–46)
Lymphs Abs: 2.5 10*3/uL (ref 0.7–4.0)
MCH: 30.5 pg (ref 26.0–34.0)
MCHC: 33.3 g/dL (ref 30.0–36.0)
MCV: 91.8 fL (ref 78.0–100.0)
Monocytes Absolute: 0.8 10*3/uL (ref 0.1–1.0)
Monocytes Relative: 8 % (ref 3–12)
Neutro Abs: 6.9 10*3/uL (ref 1.7–7.7)
Neutrophils Relative %: 66 % (ref 43–77)
Platelets: 353 10*3/uL (ref 150–400)
RBC: 4.65 MIL/uL (ref 3.87–5.11)
RDW: 13.4 % (ref 11.5–15.5)
WBC: 10.5 10*3/uL (ref 4.0–10.5)

## 2013-11-14 LAB — COMPREHENSIVE METABOLIC PANEL
ALT: 11 U/L (ref 0–35)
AST: 17 U/L (ref 0–37)
Albumin: 3.5 g/dL (ref 3.5–5.2)
Alkaline Phosphatase: 82 U/L (ref 39–117)
Anion gap: 15 (ref 5–15)
BUN: 8 mg/dL (ref 6–23)
CO2: 21 mEq/L (ref 19–32)
Calcium: 9 mg/dL (ref 8.4–10.5)
Chloride: 102 mEq/L (ref 96–112)
Creatinine, Ser: 0.67 mg/dL (ref 0.50–1.10)
GFR calc Af Amer: >90 mL/min (ref >90)
GFR calc non Af Amer: >90 mL/min (ref >90)
Glucose, Bld: 84 mg/dL (ref 70–99)
Potassium: 3.9 mEq/L (ref 3.7–5.3)
Sodium: 138 mEq/L (ref 137–147)
Total Bilirubin: 0.2 mg/dL — ABNORMAL LOW (ref 0.3–1.2)
Total Protein: 6.6 g/dL (ref 6.0–8.3)

## 2013-11-14 LAB — I-STAT CHEM 8, ED
BUN: 7 mg/dL (ref 6–23)
Calcium, Ion: 1.2 mmol/L (ref 1.12–1.23)
Chloride: 107 mEq/L (ref 96–112)
Creatinine, Ser: 0.7 mg/dL (ref 0.50–1.10)
Glucose, Bld: 81 mg/dL (ref 70–99)
HCT: 45 % (ref 36.0–46.0)
Hemoglobin: 15.3 g/dL — ABNORMAL HIGH (ref 12.0–15.0)
Potassium: 4 mEq/L (ref 3.7–5.3)
Sodium: 139 mEq/L (ref 137–147)
TCO2: 23 mmol/L (ref 0–100)

## 2013-11-14 LAB — URINALYSIS, ROUTINE W REFLEX MICROSCOPIC
Bilirubin Urine: NEGATIVE
Glucose, UA: NEGATIVE mg/dL
Ketones, ur: NEGATIVE mg/dL
Nitrite: NEGATIVE
Protein, ur: NEGATIVE mg/dL
Specific Gravity, Urine: 1.014 (ref 1.005–1.030)
Urobilinogen, UA: 0.2 mg/dL (ref 0.0–1.0)
pH: 6 (ref 5.0–8.0)

## 2013-11-14 LAB — URINE MICROSCOPIC-ADD ON

## 2013-11-14 LAB — POC URINE PREG, ED: Preg Test, Ur: NEGATIVE

## 2013-11-14 LAB — LIPASE, BLOOD: Lipase: 18 U/L (ref 11–59)

## 2013-11-14 MED ORDER — MORPHINE SULFATE 4 MG/ML IJ SOLN
4.0000 mg | Freq: Once | INTRAMUSCULAR | Status: AC
  Administered 2013-11-14: 4 mg via INTRAVENOUS
  Filled 2013-11-14: qty 1

## 2013-11-14 NOTE — ED Notes (Signed)
PT monitored by pulse ox, bp cuff, and 5-lead. 

## 2013-11-14 NOTE — ED Notes (Signed)
Patient with abdominal pain that started on Friday.  Patient states that the pain is radiating into her chest.  Patient states she does have some nausea and has vomited multiple times.

## 2013-11-14 NOTE — ED Provider Notes (Signed)
CSN: [id number]     Arrival date & time 11/14/13  1908 History   First MD Initiated Contact with Patient 11/14/13 2050     Chief Complaint  Patient presents with  . Abdominal Pain     (Consider location/radiation/quality/duration/timing/severity/associated sxs/prior Treatment) HPI Comments: Patient presents to the emergency department with chief complaint of right-sided abdominal pain. She states the pain started on Friday it worsened last night. She reports associated nausea, vomiting, and one episode of diarrhea. She denies seeing any blood in her vomit or stool. She states that her pain is severe. She has not tried taking anything to alleviate her symptoms. There are no aggravating factors. She denies fevers, chills, chest pain or shortness of breath.  The history is provided by the patient. No language interpreter was used.    Past Medical History  Diagnosis Date  . Renal disorder   . Interstitial cystitis   . Thyroid disease    History reviewed. No pertinent past surgical history. History reviewed. No pertinent family history. History  Substance Use Topics  . Smoking status: Current Every Day Smoker  . Smokeless tobacco: Not on file  . Alcohol Use: Yes   OB History   Grav Para Term Preterm Abortions TAB SAB Ect Mult Living                 Review of Systems  Constitutional: Negative for fever and chills.  Respiratory: Negative for shortness of breath.   Cardiovascular: Negative for chest pain.  Gastrointestinal: Positive for abdominal pain. Negative for nausea, vomiting, diarrhea and constipation.  Genitourinary: Negative for dysuria.  All other systems reviewed and are negative.     Allergies  Gabapentin and Zofran  Home Medications   Prior to Admission medications   Medication Sig Start Date End Date Taking? Authorizing Provider  albuterol (PROVENTIL HFA;VENTOLIN HFA) 108 (90 BASE) MCG/ACT inhaler Inhale 2 puffs into the lungs every 6 (six) hours as needed  for wheezing or shortness of breath.   Yes Historical Provider, MD  amitriptyline (ELAVIL) 50 MG tablet Take 50 mg by mouth at bedtime.   Yes Historical Provider, MD  Aspirin-Acetaminophen-Caffeine (EXCEDRIN PO) Take 2 tablets by mouth 2 (two) times daily as needed. For pain   Yes Historical Provider, MD  clonazePAM (KLONOPIN) 1 MG tablet Take 1 mg by mouth 3 (three) times daily as needed for anxiety (panic attacks).   Yes Historical Provider, MD  hydrochlorothiazide (HYDRODIURIL) 25 MG tablet Take 25 mg by mouth daily.   Yes Historical Provider, MD  levothyroxine (SYNTHROID, LEVOTHROID) 50 MCG tablet Take 50 mcg by mouth daily before breakfast.   Yes Historical Provider, MD  Oxycodone HCl 10 MG TABS Take 10 mg by mouth 4 (four) times daily as needed (pain).    Yes Historical Provider, MD  traZODone (DESYREL) 100 MG tablet Take 100 mg by mouth at bedtime.   Yes Historical Provider, MD  venlafaxine XR (EFFEXOR-XR) 150 MG 24 hr capsule Take 150 mg by mouth daily with breakfast.   Yes Historical Provider, MD   BP 129/94  Pulse 90  Temp(Src) 98.9 F (37.2 C) (Oral)  Resp 12  Ht  (1.6 m)  SpO2 100%  LMP 11/14/2013 Physical Exam  Nursing note and vitals reviewed. Constitutional: She is oriented to person, place, and time. She appears well-developed and well-nourished.  HENT:  Head: Normocephalic and atraumatic.  Eyes: Conjunctivae and EOM are normal. Pupils are equal, round, and reactive to light.  Neck: Normal range of  motion. Neck supple.  Cardiovascular: Normal rate and regular rhythm.  Exam reveals no gallop and no friction rub.   No murmur heard. Pulmonary/Chest: Effort normal and breath sounds normal. No respiratory distress. She has no wheezes. She has no rales. She exhibits no tenderness.  Abdominal: Soft. Bowel sounds are normal. She exhibits no distension and no mass. There is no tenderness. There is no rebound and no guarding.  RUQ ttp with Murphy's sign, no left sided  abdominal tenderness, some RLQ tenderness, but more in the upper right  Musculoskeletal: Normal range of motion. She exhibits no edema and no tenderness.  Neurological: She is alert and oriented to person, place, and time.  Skin: Skin is warm and dry.  Psychiatric: She has a normal mood and affect. Her behavior is normal. Judgment and thought content normal.    ED Course  Procedures (including critical care time) Results for orders placed during the hospital encounter of 11/14/13  CBC WITH DIFFERENTIAL      Result Value Ref Range   WBC 10.5  4.0 - 10.5 K/uL   RBC 4.65  3.87 - 5.11 MIL/uL   Hemoglobin 14.2  12.0 - 15.0 g/dL   HCT 16.1  09.6 - 04.5 %   MCV 91.8  78.0 - 100.0 fL   MCH 30.5  26.0 - 34.0 pg   MCHC 33.3  30.0 - 36.0 g/dL   RDW 40.9  81.1 - 91.4 %   Platelets 353  150 - 400 K/uL   Neutrophils Relative % 66  43 - 77 %   Neutro Abs 6.9  1.7 - 7.7 K/uL   Lymphocytes Relative 24  12 - 46 %   Lymphs Abs 2.5  0.7 - 4.0 K/uL   Monocytes Relative 8  3 - 12 %   Monocytes Absolute 0.8  0.1 - 1.0 K/uL   Eosinophils Relative 2  0 - 5 %   Eosinophils Absolute 0.2  0.0 - 0.7 K/uL   Basophils Relative 0  0 - 1 %   Basophils Absolute 0.0  0.0 - 0.1 K/uL  URINALYSIS, ROUTINE W REFLEX MICROSCOPIC      Result Value Ref Range   Color, Urine YELLOW  YELLOW   APPearance CLOUDY (*) CLEAR   Specific Gravity, Urine 1.014  1.005 - 1.030   pH 6.0  5.0 - 8.0   Glucose, UA NEGATIVE  NEGATIVE mg/dL   Hgb urine dipstick LARGE (*) NEGATIVE   Bilirubin Urine NEGATIVE  NEGATIVE   Ketones, ur NEGATIVE  NEGATIVE mg/dL   Protein, ur NEGATIVE  NEGATIVE mg/dL   Urobilinogen, UA 0.2  0.0 - 1.0 mg/dL   Nitrite NEGATIVE  NEGATIVE   Leukocytes, UA TRACE (*) NEGATIVE  LIPASE, BLOOD      Result Value Ref Range   Lipase 18  11 - 59 U/L  COMPREHENSIVE METABOLIC PANEL      Result Value Ref Range   Sodium 138  137 - 147 mEq/L   Potassium 3.9  3.7 - 5.3 mEq/L   Chloride 102  96 - 112 mEq/L   CO2 21  19  - 32 mEq/L   Glucose, Bld 84  70 - 99 mg/dL   BUN 8  6 - 23 mg/dL   Creatinine, Ser 7.82  0.50 - 1.10 mg/dL   Calcium 9.0  8.4 - 95.6 mg/dL   Total Protein 6.6  6.0 - 8.3 g/dL   Albumin 3.5  3.5 - 5.2 g/dL   AST 17  0 -  37 U/L   ALT 11  0 - 35 U/L   Alkaline Phosphatase 82  39 - 117 U/L   Total Bilirubin 0.2 (*) 0.3 - 1.2 mg/dL   GFR calc non Af Amer >90  >90 mL/min   GFR calc Af Amer >90  >90 mL/min   Anion gap 15  5 - 15  URINE MICROSCOPIC-ADD ON      Result Value Ref Range   Squamous Epithelial / LPF RARE  RARE   WBC, UA 0-2  <3 WBC/hpf   RBC / HPF TOO NUMEROUS TO COUNT  <3 RBC/hpf   Bacteria, UA RARE  RARE  I-STAT CHEM 8, ED      Result Value Ref Range   Sodium 139  137 - 147 mEq/L   Potassium 4.0  3.7 - 5.3 mEq/L   Chloride 107  96 - 112 mEq/L   BUN 7  6 - 23 mg/dL   Creatinine, Ser 0.86  0.50 - 1.10 mg/dL   Glucose, Bld 81  70 - 99 mg/dL   Calcium, Ion 5.78  1.12 - 1.23 mmol/L   TCO2 23  0 - 100 mmol/L   Hemoglobin 15.3 (*) 12.0 - 15.0 g/dL   HCT 46.9  62.9 - 52.8 %  POC URINE PREG, ED      Result Value Ref Range   Preg Test, Ur NEGATIVE  NEGATIVE   US Abdomen Complete  11/15/2013   CLINICAL DATA:  Right upper quadrant pain.  Renal disorder.  EXAM: ULTRASOUND ABDOMEN COMPLETE  COMPARISON:  CT of the abdomen and pelvis on 02/27/2009 and 04/28/2003  FINDINGS: Gallbladder:  Gallbladder has a normal appearance. Gallbladder wall is 1.5 mm, within normal limits. No stones or pericholecystic fluid. No sonographic Murphy's sign.  Common bile duct:  Diameter: 6.1 mm  Liver:  Adjacent to the falciform ligament there is an echogenic circumscribed mass measuring 3.2 x 3.0 x 2.6 cm.  IVC:  No abnormality visualized.  Pancreas:  There is limited visualization of the pancreas.  Spleen:  8.6 cm and normal in appearance.  Right Kidney:  Length: 9.6 cm. No focal mass identified. No hydronephrosis. The medullary portion of the kidney appears echogenic.  Left Kidney:  Length: 10.5 cm. No focal  mass. No hydronephrosis. Echogenic medullary portion of the kidney.  Abdominal aorta:  2.0 cm  Other findings:  None.  IMPRESSION: 1. Question of medullary nephrocalcinosis. 2. No hydronephrosis. 3. Echogenic lesion within the liver warrants further evaluation as it is possibly new since prior studies. 4. MRI of the abdomen with contrast is recommended.   Electronically Signed   By: Rosalie Gums M.D.   On: 11/15/2013 00:05      EKG Interpretation None      MDM   Final diagnoses:  Right upper quadrant pain    Patient with right quadrant abdominal pain. Will check labs, and will pain, will reassess.  Ultrasound is remarkable as above, no acute process. Will recommend MRI of the abdomen on an outpatient basis.  Patient seen by and discussed with Dr. Fayrene Fearing, who agrees with treatment, workup, and discharge plan.  No white count, no fever.  Symptoms have been ongoing for 5 days.  Highly doubt appy.  Pain is in the upper right quadrant.  No lower abdominal tenderness.  No pelvic pain or vaginal discharge.  The patient is on her period, but denies any cramps.  Will treat with H2 blocker, carafate, and give some pain meds.  Recommend PCP follow-up.  Patient is stable and ready for discharge.  Filed Vitals:   11/15/13 0100  BP: 107/68  Pulse: 86  Temp:   Resp:     Patient discussed with Dr. Fayrene Fearing, who agrees with the plan.  Roxy Horseman, PA-C 11/15/13 [phone]

## 2013-11-15 MED ORDER — RANITIDINE HCL 150 MG PO CAPS: 150.0000 mg | ORAL_CAPSULE | Freq: Every day | ORAL | Status: DC

## 2013-11-15 MED ORDER — OXYCODONE-ACETAMINOPHEN 5-325 MG PO TABS: 2.0000 | ORAL_TABLET | Freq: Four times a day (QID) | ORAL | Status: DC | PRN

## 2013-11-15 MED ORDER — SUCRALFATE 1 GM/10ML PO SUSP: 1.0000 g | Freq: Three times a day (TID) | ORAL | Status: DC

## 2013-11-15 MED ORDER — HYDROMORPHONE HCL 1 MG/ML IJ SOLN
1.0000 mg | Freq: Once | INTRAMUSCULAR | Status: AC
  Administered 2013-11-15: 1 mg via INTRAVENOUS
  Filled 2013-11-15: qty 1

## 2013-11-15 NOTE — ED Notes (Signed)
Pt A&OX4, ambulatory at d/c with steady gait, NAD, reports her father will be driving her home

## 2013-11-15 NOTE — Discharge Instructions (Signed)
You need to schedule an MRI of your liver.  This needs to be done as soon as possible.  Please schedule this through your primary care doctor.  Abdominal Pain Many things can cause abdominal pain. Usually, abdominal pain is not caused by a disease and will improve without treatment. It can often be observed and treated at home. Your health care provider will do a physical exam and possibly order blood tests and X-rays to help determine the seriousness of your pain. However, in many cases, more time must pass before a clear cause of the pain can be found. Before that point, your health care provider may not know if you need more testing or further treatment. HOME CARE INSTRUCTIONS  Monitor your abdominal pain for any changes. The following actions may help to alleviate any discomfort you are experiencing:  Only take over-the-counter or prescription medicines as directed by your health care provider.  Do not take laxatives unless directed to do so by your health care provider.  Try a clear liquid diet (broth, tea, or water) as directed by your health care provider. Slowly move to a bland diet as tolerated. SEEK MEDICAL CARE IF:  You have unexplained abdominal pain.  You have abdominal pain associated with nausea or diarrhea.  You have pain when you urinate or have a bowel movement.  You experience abdominal pain that wakes you in the night.  You have abdominal pain that is worsened or improved by eating food.  You have abdominal pain that is worsened with eating fatty foods.  You have a fever. SEEK IMMEDIATE MEDICAL CARE IF:   Your pain does not go away within 2 hours.  You keep throwing up (vomiting).  Your pain is felt only in portions of the abdomen, such as the right side or the left lower portion of the abdomen.  You pass bloody or black tarry stools. MAKE SURE YOU:  Understand these instructions.   Will watch your condition.   Will get help right away if you are not  doing well or get worse.  Document Released: 11/25/2004 Document Revised: 02/20/2013 Document Reviewed: 10/25/2012 [facility] Patient Information 2015 [place], [place]. This information is not intended to replace advice given to you by your health care provider. Make sure you discuss any questions you have with your health care provider.

## 2013-11-15 NOTE — ED Notes (Signed)
Dr. James at bedside  

## 2013-11-15 NOTE — ED Provider Notes (Signed)
Patient seen and evaluated. She reports abdominal pain for the last 5 days. Primarily upper abdominal. Frequent vomiting. Food causes pain with an 15-20 minutes of eating. On exam she has right upper abdominal pain. She does not localize any pain to arrival her quadrant. She has no peritoneal irritation. She has not referred pain from the right upper or left lower abdomen to the right lower quadrant. I discussed with her the abnormality on ultrasound. If recommended she follow up with primary care for outpatient MRI of the abdomen. I think this is very likely unrelated to her symptomatology today. She has no leukocytosis, she is afebrile, her hepatobiliary and pancreatic enzymes are normal.  After 5 days with appendicitis or other separate intra-abdominal processes, Iwould  expect peritoneal irritation or more localized exam. Plan will be treatment with proton pump inhibitor, Carafate, pain medication, followup. Avoid alcohol, tobacco, caffeine, anti-inflammatories. Return here with any failure to improve or worsening.  Rolland Porter, MD 11/15/13 [phone]

## 2013-11-22 NOTE — ED Provider Notes (Signed)
Medical screening examination/treatment/procedure(s) were performed by non-physician practitioner and as supervising physician I was immediately available for consultation/collaboration.   EKG Interpretation None        Rolland Porter, MD 11/22/13 [phone]

## 2013-11-26 ENCOUNTER — Ambulatory Visit: Payer: Medicaid Other | Admitting: Family Medicine

## 2014-01-04 ENCOUNTER — Ambulatory Visit (INDEPENDENT_AMBULATORY_CARE_PROVIDER_SITE_OTHER): Payer: Medicaid Other | Admitting: Nurse Practitioner

## 2014-01-04 ENCOUNTER — Encounter: Payer: Self-pay | Admitting: Nurse Practitioner

## 2014-01-04 VITALS — BP 110/72 | Ht 62.0 in | Wt 151.0 lb

## 2014-01-04 DIAGNOSIS — K7689 Other specified diseases of liver: Secondary | ICD-10-CM

## 2014-01-04 DIAGNOSIS — G8929 Other chronic pain: Secondary | ICD-10-CM

## 2014-01-04 DIAGNOSIS — K769 Liver disease, unspecified: Secondary | ICD-10-CM

## 2014-01-04 DIAGNOSIS — M549 Dorsalgia, unspecified: Secondary | ICD-10-CM

## 2014-01-04 MED ORDER — OXYCODONE-ACETAMINOPHEN 10-325 MG PO TABS
1.0000 | ORAL_TABLET | Freq: Three times a day (TID) | ORAL | Status: DC | PRN
Start: 2014-01-04 — End: 2014-01-16

## 2014-01-07 ENCOUNTER — Other Ambulatory Visit: Payer: Self-pay | Admitting: *Deleted

## 2014-01-10 ENCOUNTER — Encounter: Payer: Self-pay | Admitting: Nurse Practitioner

## 2014-01-10 NOTE — Progress Notes (Signed)
Subjective:  Presents to discuss chronic back pain and pain management. Has been seen by pain management specialist. Felt she was disrespected at last visit. Slightly agitated during conversation. According to patient she was told that her prescribed narcotic was not found in her urine test. Requesting a referral to a new pain management specialist. In her conversation with the nurse, patient states she was upset because she requested OxyContin be increased to 15 mg and was refused. Requesting referral to [facility]. According to patient she has been out of her medication for pain. Had some problems with her narcotics specifically Percocet, patient blames reaction on Tylenol. States "that stuff will kill you". Requests plain oxycodone without Tylenol.   Objective:   BP 110/72 mmHg  Ht 5\' 2"  (1.575 m)  Wt 151 lb (68.493 kg)  BMI 27.61 kg/m2 NAD. Alert, oriented. Slightly agitated during visit. Note pupils are extremely dilated. When asked about this patient states they're always dilated even when she was younger. Lungs clear. Heart regular rate rhythm. Thoughts are scattered but coherent and relevant. Dressed appropriately. Ultrasound of the abdomen dated 9/16 shows a lesion within the liver, radiologist recommends MRI of the abdomen with contrast. Labs dated 9/16 shows normal AST and ALT.  Assessment: Chronic back pain - Plan: Ambulatory referral to Pain Clinic  Hepatic lesion - Plan: MR Abdomen W Contrast, MR Abdomen W Wo Contrast  Plan:  Meds ordered this encounter  Medications  . oxyCODONE-acetaminophen (PERCOCET) 10-325 MG per tablet    Sig: Take 1 tablet by mouth every 8 (eight) hours as needed for pain.    Dispense:  42 tablet    Refill:  0    Order Specific Question:  Supervising Provider    Answer:  Merlyn AlbertLUKING, WILLIAM S [[id number]]   A lengthy conversation with the patient regarding addiction potential of medications that she is on. Also explanation that if medications are  not working and they need to be stopped. Also showed patient that she has slowly escalated the dosage and strength of her narcotics. Patient requesting 4 Percocet per day until new pain management appointment. Given limited prescription for 3 times a day for 2 weeks. Patient understands her choices may be limited due to Medicaid.

## 2014-01-14 ENCOUNTER — Ambulatory Visit (HOSPITAL_COMMUNITY): Payer: Medicaid Other

## 2014-01-15 ENCOUNTER — Telehealth: Payer: Self-pay | Admitting: Family Medicine

## 2014-01-15 NOTE — Telephone Encounter (Signed)
Patient calling to check on her referral to pain management.  Also, she said that she was supposed to have an MRI set up, but no one ever contacted her. Please advise. She is very upset.

## 2014-01-16 ENCOUNTER — Telehealth: Payer: Self-pay | Admitting: *Deleted

## 2014-01-16 MED ORDER — OXYCODONE-ACETAMINOPHEN 10-325 MG PO TABS: 1.0000 | ORAL_TABLET | Freq: Four times a day (QID) | ORAL | Status: DC | PRN

## 2014-01-16 NOTE — Telephone Encounter (Signed)
Kimberly Weiss, spoke with the patient. MRI was set up as requested. Pain management referral was put in.

## 2014-01-16 NOTE — Telephone Encounter (Signed)
Results of Quantitative UDT from pain management

## 2014-01-16 NOTE — Telephone Encounter (Signed)
Rx up front for pick up. Patient notified. 

## 2014-01-16 NOTE — Addendum Note (Signed)
Addended by: Margaretha SheffieldBROWN, Jovonni Borquez S on: 01/16/2014 01:20 PM   Modules accepted: Orders

## 2014-01-16 NOTE — Telephone Encounter (Signed)
I reviewed over the results of her urine drug screen. Oxymorphone was prescribed several days before this urine drug screen. She had reported in the pain clinic's note that she had taken that medicine even the morning of the urine drug screen. The detail analysis did in fact show that the patient had this in her system. The pain medicine doctor shifted her to Percocet 4 times daily. She saw Eber JonesCarolyn for the desire of getting referred to a different pain management doctor. This referral was initiated. The patient has requested additional Percocet until she can get in with the pain management doctor. The patient has been told that our office does not feel comfortable prescribing long-term pain management to our patients. This patient may have Percocet prescription 4 times a day, 56, two-week supply. In 2 weeks we will give an additional 2 weeks. The patient must make this medicine last 2 weeks. It will not be refilled early.The patient will need follow-up in approximately 3-4 weeks.

## 2014-01-23 ENCOUNTER — Telehealth: Payer: Self-pay | Admitting: Family Medicine

## 2014-01-23 NOTE — Telephone Encounter (Signed)
FYI - Patient is scheduled to see [facility] on 02/13/2014 @ 10:00

## 2014-01-28 ENCOUNTER — Telehealth: Payer: Self-pay | Admitting: Family Medicine

## 2014-01-28 MED ORDER — OXYCODONE-ACETAMINOPHEN 10-325 MG PO TABS: 1.0000 | ORAL_TABLET | Freq: Four times a day (QID) | ORAL | Status: DC | PRN

## 2014-01-28 NOTE — Telephone Encounter (Signed)
Spoke with Dr. Lorin PicketScott and he approved patient to have 17 days worth, # 68, to be filled on Jan 30, 2014. 1 tablet every 6 hrs prn.

## 2014-01-28 NOTE — Telephone Encounter (Signed)
Pt states she will be seen for the first time by pain management on 12/16  She would like a refill on her pain med to get her through to this appt  oxyCODONE-acetaminophen (PERCOCET) 10-325 MG per tablet

## 2014-01-28 NOTE — Telephone Encounter (Signed)
Notified patient that script is ready for pickup.  

## 2014-02-04 ENCOUNTER — Ambulatory Visit (HOSPITAL_COMMUNITY)
Admission: RE | Admit: 2014-02-04 | Discharge: 2014-02-04 | Disposition: A | Discharge status: Home / Self Care | Payer: Medicaid Other | Source: Ambulatory Visit | Attending: Nurse Practitioner | Admitting: Nurse Practitioner

## 2014-02-04 DIAGNOSIS — R1011 Right upper quadrant pain: Secondary | ICD-10-CM | POA: Diagnosis not present

## 2014-02-04 DIAGNOSIS — R932 Abnormal findings on diagnostic imaging of liver and biliary tract: Secondary | ICD-10-CM | POA: Insufficient documentation

## 2014-02-04 MED ORDER — SODIUM CHLORIDE 0.9 % IV SOLN
INTRAVENOUS | Status: AC
  Filled 2014-02-04: qty 250

## 2014-02-04 MED ORDER — GADOXETATE DISODIUM 0.25 MMOL/ML IV SOLN
7.0000 mL | Freq: Once | INTRAVENOUS | Status: AC | PRN
  Administered 2014-02-04: 7 mL via INTRAVENOUS

## 2014-02-04 MED ORDER — SODIUM CHLORIDE 0.9 % IJ SOLN
INTRAMUSCULAR | Status: AC
  Filled 2014-02-04: qty 15

## 2014-02-05 NOTE — Progress Notes (Signed)
Patient notified and verbalized understanding of the test results. No further questions. 

## 2014-03-02 ENCOUNTER — Other Ambulatory Visit: Payer: Self-pay | Admitting: Family Medicine

## 2014-03-30 ENCOUNTER — Other Ambulatory Visit: Payer: Self-pay | Admitting: Nurse Practitioner

## 2014-03-30 ENCOUNTER — Other Ambulatory Visit: Payer: Self-pay | Admitting: Family Medicine

## 2014-04-01 NOTE — Telephone Encounter (Signed)
Nurses: I cannot see where we have been filling this. Please check to see if you can find prescriber.

## 2014-04-01 NOTE — Telephone Encounter (Signed)
It [facility] to [facility]

## 2014-04-01 NOTE — Telephone Encounter (Signed)
I will send in 30 pills; needs office visit since this is a new Rx from our office. Thanks.

## 2014-04-02 ENCOUNTER — Other Ambulatory Visit: Payer: Self-pay | Admitting: *Deleted

## 2014-04-17 ENCOUNTER — Other Ambulatory Visit: Payer: Self-pay | Admitting: Family Medicine

## 2014-04-26 ENCOUNTER — Inpatient Hospital Stay (HOSPITAL_COMMUNITY)
Admission: EM | Admit: 2014-04-26 | Discharge: 2014-05-14 | DRG: 326 | Disposition: A | Discharge status: Home / Self Care | Payer: Medicaid Other | Attending: Internal Medicine | Admitting: Internal Medicine

## 2014-04-26 ENCOUNTER — Other Ambulatory Visit (HOSPITAL_COMMUNITY): Payer: Self-pay

## 2014-04-26 ENCOUNTER — Encounter (HOSPITAL_COMMUNITY): Payer: Self-pay

## 2014-04-26 DIAGNOSIS — G8929 Other chronic pain: Secondary | ICD-10-CM | POA: Diagnosis present

## 2014-04-26 DIAGNOSIS — D62 Acute posthemorrhagic anemia: Secondary | ICD-10-CM

## 2014-04-26 DIAGNOSIS — E872 Acidosis, unspecified: Secondary | ICD-10-CM

## 2014-04-26 DIAGNOSIS — R000 Tachycardia, unspecified: Secondary | ICD-10-CM | POA: Diagnosis present

## 2014-04-26 DIAGNOSIS — K922 Gastrointestinal hemorrhage, unspecified: Secondary | ICD-10-CM

## 2014-04-26 DIAGNOSIS — Z720 Tobacco use: Secondary | ICD-10-CM | POA: Diagnosis present

## 2014-04-26 DIAGNOSIS — T39395A Adverse effect of other nonsteroidal anti-inflammatory drugs [NSAID], initial encounter: Secondary | ICD-10-CM | POA: Diagnosis present

## 2014-04-26 DIAGNOSIS — R578 Other shock: Secondary | ICD-10-CM | POA: Insufficient documentation

## 2014-04-26 DIAGNOSIS — E039 Hypothyroidism, unspecified: Secondary | ICD-10-CM | POA: Diagnosis present

## 2014-04-26 DIAGNOSIS — N3010 Interstitial cystitis (chronic) without hematuria: Secondary | ICD-10-CM | POA: Diagnosis present

## 2014-04-26 DIAGNOSIS — F17210 Nicotine dependence, cigarettes, uncomplicated: Secondary | ICD-10-CM | POA: Diagnosis present

## 2014-04-26 DIAGNOSIS — F1120 Opioid dependence, uncomplicated: Secondary | ICD-10-CM | POA: Diagnosis present

## 2014-04-26 DIAGNOSIS — K269 Duodenal ulcer, unspecified as acute or chronic, without hemorrhage or perforation: Secondary | ICD-10-CM | POA: Diagnosis present

## 2014-04-26 DIAGNOSIS — F411 Generalized anxiety disorder: Secondary | ICD-10-CM | POA: Diagnosis present

## 2014-04-26 DIAGNOSIS — R571 Hypovolemic shock: Secondary | ICD-10-CM | POA: Diagnosis present

## 2014-04-26 DIAGNOSIS — I9589 Other hypotension: Secondary | ICD-10-CM | POA: Diagnosis present

## 2014-04-26 DIAGNOSIS — R059 Cough, unspecified: Secondary | ICD-10-CM

## 2014-04-26 DIAGNOSIS — E875 Hyperkalemia: Secondary | ICD-10-CM | POA: Diagnosis present

## 2014-04-26 DIAGNOSIS — E038 Other specified hypothyroidism: Secondary | ICD-10-CM | POA: Diagnosis present

## 2014-04-26 DIAGNOSIS — K264 Chronic or unspecified duodenal ulcer with hemorrhage: Secondary | ICD-10-CM | POA: Insufficient documentation

## 2014-04-26 DIAGNOSIS — S0990XA Unspecified injury of head, initial encounter: Secondary | ICD-10-CM | POA: Diagnosis present

## 2014-04-26 DIAGNOSIS — R05 Cough: Secondary | ICD-10-CM

## 2014-04-26 HISTORY — DX: Hypothyroidism, unspecified: E03.9

## 2014-04-26 HISTORY — DX: Calculus of kidney: N20.0

## 2014-04-26 LAB — I-STAT CHEM 8, ED
BUN: 18 mg/dL (ref 6–23)
Calcium, Ion: 1.06 mmol/L — ABNORMAL LOW (ref 1.12–1.23)
Chloride: 109 mmol/L (ref 96–112)
Creatinine, Ser: 0.8 mg/dL (ref 0.50–1.10)
Glucose, Bld: 117 mg/dL — ABNORMAL HIGH (ref 70–99)
HCT: 28 % — ABNORMAL LOW (ref 36.0–46.0)
Hemoglobin: 9.5 g/dL — ABNORMAL LOW (ref 12.0–15.0)
Potassium: 4.2 mmol/L (ref 3.5–5.1)
Sodium: 141 mmol/L (ref 135–145)
TCO2: 17 mmol/L (ref 0–100)

## 2014-04-26 LAB — PROTIME-INR
INR: 1.16 (ref 0.00–1.49)
Prothrombin Time: 14.9 seconds (ref 11.6–15.2)

## 2014-04-26 LAB — CBC
HCT: 25.8 % — ABNORMAL LOW (ref 36.0–46.0)
Hemoglobin: 8.5 g/dL — ABNORMAL LOW (ref 12.0–15.0)
MCH: 28.4 pg (ref 26.0–34.0)
MCHC: 32.9 g/dL (ref 30.0–36.0)
MCV: 86.3 fL (ref 78.0–100.0)
Platelets: 356 10*3/uL (ref 150–400)
RBC: 2.99 MIL/uL — ABNORMAL LOW (ref 3.87–5.11)
RDW: 14.3 % (ref 11.5–15.5)
WBC: 32.6 10*3/uL — ABNORMAL HIGH (ref 4.0–10.5)

## 2014-04-26 LAB — I-STAT BETA HCG BLOOD, ED (MC, WL, AP ONLY): I-stat hCG, quantitative: 7.2 m[IU]/mL — ABNORMAL HIGH (ref <5)

## 2014-04-26 LAB — I-STAT CG4 LACTIC ACID, ED: Lactic Acid, Venous: 2.57 mmol/L (ref 0.5–2.0)

## 2014-04-26 LAB — PREPARE RBC (CROSSMATCH)

## 2014-04-26 MED ORDER — PANTOPRAZOLE SODIUM 40 MG IV SOLR: 40.0000 mg | Freq: Two times a day (BID) | INTRAVENOUS | Status: DC

## 2014-04-26 MED ORDER — SODIUM CHLORIDE 0.9 % IV SOLN
8.0000 mg/h | INTRAVENOUS | Status: AC
  Administered 2014-04-27 – 2014-05-01 (×9): 8 mg/h via INTRAVENOUS
  Filled 2014-04-26 (×24): qty 80

## 2014-04-26 MED ORDER — PANTOPRAZOLE SODIUM 40 MG IV SOLR
40.0000 mg | Freq: Once | INTRAVENOUS | Status: AC
Start: 2014-04-26 — End: 2014-04-27

## 2014-04-26 MED ORDER — PANTOPRAZOLE SODIUM 40 MG IV SOLR
40.0000 mg | Freq: Once | INTRAVENOUS | Status: AC
  Administered 2014-04-26: 40 mg via INTRAVENOUS
  Filled 2014-04-26: qty 40

## 2014-04-26 MED ORDER — SODIUM CHLORIDE 0.9 % IV SOLN
Freq: Once | INTRAVENOUS | Status: AC
  Administered 2014-04-27: 03:00:00 via INTRAVENOUS

## 2014-04-26 MED ORDER — SODIUM CHLORIDE 0.9 % IV BOLUS (SEPSIS)
500.0000 mL | Freq: Once | INTRAVENOUS | Status: AC
  Administered 2014-04-26: 500 mL via INTRAVENOUS

## 2014-04-26 MED ORDER — SODIUM CHLORIDE 0.9 % IV BOLUS (SEPSIS)
2000.0000 mL | Freq: Once | INTRAVENOUS | Status: AC
  Administered 2014-04-26: 2000 mL via INTRAVENOUS

## 2014-04-26 NOTE — ED Provider Notes (Signed)
CSN: [id number]     Arrival date & time 04/26/14  2218 History   First MD Initiated Contact with Patient 04/26/14 2240     Chief Complaint  Patient presents with  . Hypotension     (Consider location/radiation/quality/duration/timing/severity/associated sxs/prior Treatment) HPI   34 year old female with past medical history of chronic back pain, currently on oxycodone, as well as chronic headaches and frequent use of Excedrin, who presents with acute onset of tachycardia, abdominal pain and syncopal event. The patient states she was watching TV with her family when she experienced acute onset of epigastric abdominal pain. She stood up and subsequent syncopized. She did not fall and was lowered to the ground by her family. She then had 2 episodes of grossly bloody emesis. She has had persistent epigastric pain since then. She also had diarrhea, but is unable to recall whether this was bloody. She denies any history of previous ulcer disease or reflux. She endorses general lightheadedness and a sensation of anxiety as well as dizziness upon sitting upright or changing positions. No recent trauma. She describes the abdominal pain as a gnawing, aching epigastric pain radiating to her back that is 8 out of 10 in severity.  Past Medical History  Diagnosis Date  . Renal disorder   . Interstitial cystitis   . Thyroid disease    History reviewed. No pertinent past surgical history. History reviewed. No pertinent family history. History  Substance Use Topics  . Smoking status: Current Every Day Smoker  . Smokeless tobacco: Not on file  . Alcohol Use: Yes   OB History    No data available     Review of Systems  Constitutional: Negative for fever and chills.  HENT: Positive for congestion (After vomiting). Negative for rhinorrhea and sore throat.   Eyes: Negative for visual disturbance.  Respiratory: Negative for cough and shortness of breath.   Cardiovascular: Negative for chest pain.   Gastrointestinal: Positive for nausea, vomiting, abdominal pain and constipation. Negative for diarrhea.       Hematemesis  Genitourinary: Negative for dysuria.  Musculoskeletal: Negative for neck pain and neck stiffness.  Skin: Negative for rash.  Allergic/Immunologic: Negative for immunocompromised state.  Neurological: Negative for dizziness, weakness and headaches.      Allergies  Gabapentin and Zofran  Home Medications   Prior to Admission medications   Medication Sig Start Date End Date Taking? Authorizing Provider  albuterol (PROVENTIL HFA;VENTOLIN HFA) 108 (90 BASE) MCG/ACT inhaler Inhale 2 puffs into the lungs every 6 (six) hours as needed for wheezing or shortness of breath.   Yes Historical Provider, MD  amitriptyline (ELAVIL) 50 MG tablet TAKE 1 TABLET (50 MG TOTAL) BY MOUTH AT BEDTIME. 03/04/14  Yes Babs Sciara, MD  Aspirin-Acetaminophen-Caffeine (EXCEDRIN PO) Take 2 tablets by mouth 2 (two) times daily as needed. For pain   Yes Historical Provider, MD  clonazePAM (KLONOPIN) 1 MG tablet 1/2-1 po BID prn panic attacks 04/01/14  Yes Campbell Riches, NP  hydrochlorothiazide (HYDRODIURIL) 25 MG tablet Take 25 mg by mouth daily.   Yes Historical Provider, MD  levothyroxine (SYNTHROID, LEVOTHROID) 50 MCG tablet TAKE 1 TABLET EVERY DAY 04/17/14  Yes Babs Sciara, MD  oxyCODONE-acetaminophen (PERCOCET) 10-325 MG per tablet Take 1 tablet by mouth every 6 (six) hours as needed for pain. 01/28/14  Yes Babs Sciara, MD  venlafaxine XR (EFFEXOR-XR) 150 MG 24 hr capsule Take 150 mg by mouth daily with breakfast.   Yes Historical Provider, MD  amitriptyline (  ELAVIL) 50 MG tablet TAKE 1 TABLET (50 MG TOTAL) BY MOUTH AT BEDTIME. 04/01/14   Merlyn AlbertWilliam S Luking, MD  ranitidine (ZANTAC) 150 MG capsule Take 1 capsule (150 mg total) by mouth daily. Patient not taking: Reported on 04/26/2014 11/15/13   Roxy Horsemanobert Browning, PA-C  sucralfate (CARAFATE) 1 GM/10ML suspension Take 10 mLs (1 g total) by  mouth 4 (four) times daily -  with meals and at bedtime. Patient not taking: Reported on 04/26/2014 11/15/13   Roxy Horsemanobert Browning, PA-C   BP 87/57 mmHg  Pulse 132  Resp 17  SpO2 100%  LMP 04/12/2014 Physical Exam  Constitutional: She is oriented to person, place, and time. She appears well-developed and well-nourished. She appears ill. She appears distressed.  Dried blood noted on the hands and legs bilaterally  HENT:  Head: Normocephalic and atraumatic.  Dried blood in the nares bilaterally  Eyes: Pupils are equal, round, and reactive to light.  Conjunctival pallor  Neck: Neck supple. No JVD present.  Cardiovascular: Normal heart sounds and intact distal pulses.  Exam reveals no friction rub.   No murmur heard. Tachycardia  Pulmonary/Chest: Effort normal and breath sounds normal. No respiratory distress. She has no wheezes. She has no rales.  Abdominal: Soft. Normal appearance and bowel sounds are normal. She exhibits no distension. There is tenderness in the epigastric area. There is no rebound and no guarding.  Musculoskeletal: She exhibits no edema.  Neurological: She is alert and oriented to person, place, and time.  Skin: Skin is warm.  Nursing note and vitals reviewed.   ED Course  Procedures (including critical care time) Labs Review Labs Reviewed  CBC - Abnormal; Notable for the following:    WBC 32.6 (*)    RBC 2.99 (*)    Hemoglobin 8.5 (*)    HCT 25.8 (*)    All other components within normal limits  I-STAT CHEM 8, ED - Abnormal; Notable for the following:    Glucose, Bld 117 (*)    Calcium, Ion 1.06 (*)    Hemoglobin 9.5 (*)    HCT 28.0 (*)    All other components within normal limits  I-STAT CG4 LACTIC ACID, ED - Abnormal; Notable for the following:    Lactic Acid, Venous 2.57 (*)    All other components within normal limits  I-STAT BETA HCG BLOOD, ED ([facility], [facility], [facility] ONLY) - Abnormal; Notable for the following:    I-stat hCG, quantitative 7.2 (*)    All other  components within normal limits  PROTIME-INR  COMPREHENSIVE METABOLIC PANEL  LIPASE, BLOOD  CBC  CBC  CBC  CBC  CBC  CBC  LACTIC ACID, PLASMA  BASIC METABOLIC PANEL  MAGNESIUM  PHOSPHORUS  TYPE AND SCREEN  PREPARE RBC (CROSSMATCH)  ABO/RH    Imaging Review No results found.   EKG Interpretation None      MDM   Final diagnoses:  UGIB (upper gastrointestinal bleed)  Acute blood loss anemia  Other specified hypotension  Tachycardia  Lactic acidosis   34 year old female with past medical history of chronic back pain on oxycodone as well as chronic headaches with frequent use of Excedrin who presents with hematemesis, tachycardia and hypotension. See history of present illness above. On arrival, patient is afebrile, heart rate 130s and blood pressure 80/40s. Patient is alert, oriented, protecting her airway at this time, with normal mentation. Exam remarkable for mild epigastric tenderness and dried blood on the hands and nares.  Patient's presentation is most concerning for acute  upper GI bleed with subsequent hematemesis. 2 large-bore peripheral IVs have been established and will start gentle fluids with permissive hypertension while sending stat type and cross for likely blood transfusion. Will start IV Protonix bolus and drip. Will send coags and brought labs. Will also send for acute abdominal series. At this time, the patient's abdomen is soft without evidence of peritonitis to suggest acute perforation.  Heart rate and blood pressure improving with fluids. Labs reviewed as above. CBC with hemoglobin 8.5 which is down from baseline of 14-15 per review of records, consistent with acute blood loss. White count 32.6, likely reactive. Lactic acid mildly elevated at 2.57. We will start transfusion of 2 units packed red blood cells. Discussed case with critical care who will admit for acute upper GI bleed. I have also discussed with GI medicine, who will plan to scope in the  morning or earlier as indicated. Patient has been consented for blood products and is in agreement with this plan  Clinical Impression: 1. UGIB (upper gastrointestinal bleed)   2. Acute blood loss anemia   3. Other specified hypotension   4. Tachycardia   5. Lactic acidosis     Disposition: Admit  Condition: Critical but stable  Pt seen in conjunction with Dr. Bethann Punches, MD 04/27/14 0865  Juliet Rude. Rubin Payor, MD 04/27/14 1500

## 2014-04-26 NOTE — ED Notes (Signed)
Pt from home with episode of near syncope. Pt threw up bright red blood x 1.  BP 70 palp on EMS arrival.  Pt went and saw doctor today about constipation but reports doctor no addressing symptoms.

## 2014-04-27 ENCOUNTER — Encounter (HOSPITAL_COMMUNITY)
Admission: EM | Disposition: A | Discharge status: Home / Self Care | Payer: Self-pay | Source: Home / Self Care | Attending: Internal Medicine

## 2014-04-27 ENCOUNTER — Encounter (HOSPITAL_COMMUNITY): Payer: Self-pay | Admitting: *Deleted

## 2014-04-27 DIAGNOSIS — F1120 Opioid dependence, uncomplicated: Secondary | ICD-10-CM | POA: Diagnosis not present

## 2014-04-27 DIAGNOSIS — D62 Acute posthemorrhagic anemia: Secondary | ICD-10-CM | POA: Diagnosis present

## 2014-04-27 DIAGNOSIS — R000 Tachycardia, unspecified: Secondary | ICD-10-CM | POA: Diagnosis present

## 2014-04-27 DIAGNOSIS — R55 Syncope and collapse: Secondary | ICD-10-CM | POA: Diagnosis present

## 2014-04-27 DIAGNOSIS — E872 Acidosis: Secondary | ICD-10-CM | POA: Diagnosis present

## 2014-04-27 DIAGNOSIS — E039 Hypothyroidism, unspecified: Secondary | ICD-10-CM | POA: Diagnosis present

## 2014-04-27 DIAGNOSIS — E875 Hyperkalemia: Secondary | ICD-10-CM | POA: Diagnosis present

## 2014-04-27 DIAGNOSIS — N3010 Interstitial cystitis (chronic) without hematuria: Secondary | ICD-10-CM | POA: Diagnosis not present

## 2014-04-27 DIAGNOSIS — G8929 Other chronic pain: Secondary | ICD-10-CM | POA: Diagnosis present

## 2014-04-27 DIAGNOSIS — F17210 Nicotine dependence, cigarettes, uncomplicated: Secondary | ICD-10-CM | POA: Diagnosis present

## 2014-04-27 DIAGNOSIS — F411 Generalized anxiety disorder: Secondary | ICD-10-CM | POA: Diagnosis present

## 2014-04-27 DIAGNOSIS — K264 Chronic or unspecified duodenal ulcer with hemorrhage: Secondary | ICD-10-CM | POA: Diagnosis not present

## 2014-04-27 DIAGNOSIS — K922 Gastrointestinal hemorrhage, unspecified: Secondary | ICD-10-CM | POA: Diagnosis present

## 2014-04-27 DIAGNOSIS — T39395A Adverse effect of other nonsteroidal anti-inflammatory drugs [NSAID], initial encounter: Secondary | ICD-10-CM | POA: Diagnosis present

## 2014-04-27 DIAGNOSIS — R571 Hypovolemic shock: Secondary | ICD-10-CM | POA: Diagnosis not present

## 2014-04-27 HISTORY — PX: ESOPHAGOGASTRODUODENOSCOPY: SHX5428

## 2014-04-27 LAB — BASIC METABOLIC PANEL
Anion gap: 5 (ref 5–15)
BUN: 22 mg/dL (ref 6–23)
CO2: 19 mmol/L (ref 19–32)
Calcium: 7.2 mg/dL — ABNORMAL LOW (ref 8.4–10.5)
Chloride: 119 mmol/L — ABNORMAL HIGH (ref 96–112)
Creatinine, Ser: 0.61 mg/dL (ref 0.50–1.10)
GFR calc Af Amer: >90 mL/min (ref >90)
GFR calc non Af Amer: >90 mL/min (ref >90)
Glucose, Bld: 81 mg/dL (ref 70–99)
Potassium: 3.9 mmol/L (ref 3.5–5.1)
Sodium: 143 mmol/L (ref 135–145)

## 2014-04-27 LAB — MRSA PCR SCREENING: MRSA by PCR: NEGATIVE

## 2014-04-27 LAB — COMPREHENSIVE METABOLIC PANEL
ALT: 11 U/L (ref 0–35)
AST: 17 U/L (ref 0–37)
Albumin: 2.4 g/dL — ABNORMAL LOW (ref 3.5–5.2)
Alkaline Phosphatase: 66 U/L (ref 39–117)
Anion gap: 7 (ref 5–15)
BUN: 15 mg/dL (ref 6–23)
CO2: 20 mmol/L (ref 19–32)
Calcium: 7.4 mg/dL — ABNORMAL LOW (ref 8.4–10.5)
Chloride: 111 mmol/L (ref 96–112)
Creatinine, Ser: 0.89 mg/dL (ref 0.50–1.10)
GFR calc Af Amer: >90 mL/min (ref >90)
GFR calc non Af Amer: 84 mL/min — ABNORMAL LOW (ref >90)
Glucose, Bld: 120 mg/dL — ABNORMAL HIGH (ref 70–99)
Potassium: 4.2 mmol/L (ref 3.5–5.1)
Sodium: 138 mmol/L (ref 135–145)
Total Bilirubin: 0.1 mg/dL — ABNORMAL LOW (ref 0.3–1.2)
Total Protein: 4.5 g/dL — ABNORMAL LOW (ref 6.0–8.3)

## 2014-04-27 LAB — CBC
HCT: 26 % — ABNORMAL LOW (ref 36.0–46.0)
HCT: 26.3 % — ABNORMAL LOW (ref 36.0–46.0)
HCT: 28.2 % — ABNORMAL LOW (ref 36.0–46.0)
Hemoglobin: 8.6 g/dL — ABNORMAL LOW (ref 12.0–15.0)
Hemoglobin: 8.9 g/dL — ABNORMAL LOW (ref 12.0–15.0)
Hemoglobin: 9.2 g/dL — ABNORMAL LOW (ref 12.0–15.0)
MCH: 28.1 pg (ref 26.0–34.0)
MCH: 27.7 pg (ref 26.0–34.0)
MCH: 27.5 pg (ref 26.0–34.0)
MCHC: 33.1 g/dL (ref 30.0–36.0)
MCHC: 33.8 g/dL (ref 30.0–36.0)
MCHC: 32.6 g/dL (ref 30.0–36.0)
MCV: 85 fL (ref 78.0–100.0)
MCV: 81.9 fL (ref 78.0–100.0)
MCV: 84.2 fL (ref 78.0–100.0)
Platelets: 241 10*3/uL (ref 150–400)
Platelets: 334 10*3/uL (ref 150–400)
Platelets: 247 10*3/uL (ref 150–400)
RBC: 3.06 MIL/uL — ABNORMAL LOW (ref 3.87–5.11)
RBC: 3.21 MIL/uL — ABNORMAL LOW (ref 3.87–5.11)
RBC: 3.35 MIL/uL — ABNORMAL LOW (ref 3.87–5.11)
RDW: 15.5 % (ref 11.5–15.5)
RDW: 15.8 % — ABNORMAL HIGH (ref 11.5–15.5)
RDW: 15.7 % — ABNORMAL HIGH (ref 11.5–15.5)
WBC: 11.9 10*3/uL — ABNORMAL HIGH (ref 4.0–10.5)
WBC: 12.6 10*3/uL — ABNORMAL HIGH (ref 4.0–10.5)
WBC: 11.3 10*3/uL — ABNORMAL HIGH (ref 4.0–10.5)

## 2014-04-27 LAB — GLUCOSE, CAPILLARY: Glucose-Capillary: 84 mg/dL (ref 70–99)

## 2014-04-27 LAB — ABO/RH: ABO/RH(D): O POS

## 2014-04-27 LAB — PHOSPHORUS: Phosphorus: 2 mg/dL — ABNORMAL LOW (ref 2.3–4.6)

## 2014-04-27 LAB — LIPASE, BLOOD: Lipase: 28 U/L (ref 11–59)

## 2014-04-27 LAB — MAGNESIUM: Magnesium: 1.6 mg/dL (ref 1.5–2.5)

## 2014-04-27 SURGERY — EGD (ESOPHAGOGASTRODUODENOSCOPY)
Anesthesia: Moderate Sedation

## 2014-04-27 MED ORDER — CHLORHEXIDINE GLUCONATE 0.12 % MT SOLN
15.0000 mL | Freq: Two times a day (BID) | OROMUCOSAL | Status: DC
  Administered 2014-04-27 – 2014-04-29 (×5): 15 mL via OROMUCOSAL
  Filled 2014-04-27 (×5): qty 15

## 2014-04-27 MED ORDER — FENTANYL CITRATE 0.05 MG/ML IJ SOLN
25.0000 ug | INTRAMUSCULAR | Status: DC | PRN
  Administered 2014-04-27 – 2014-04-28 (×3): 50 ug via INTRAVENOUS
  Filled 2014-04-27 (×3): qty 2

## 2014-04-27 MED ORDER — SODIUM CHLORIDE 0.9 % IV SOLN
250.0000 mL | INTRAVENOUS | Status: DC | PRN
  Administered 2014-04-27: 10 mL via INTRAVENOUS

## 2014-04-27 MED ORDER — FOLIC ACID 1 MG PO TABS
1.0000 mg | ORAL_TABLET | Freq: Every day | ORAL | Status: DC
  Filled 2014-04-27: qty 1

## 2014-04-27 MED ORDER — HYDROMORPHONE HCL 1 MG/ML IJ SOLN
0.5000 mg | INTRAMUSCULAR | Status: DC | PRN
  Administered 2014-04-27 – 2014-04-28 (×5): 1 mg via INTRAVENOUS
  Filled 2014-04-27 (×5): qty 1

## 2014-04-27 MED ORDER — CETYLPYRIDINIUM CHLORIDE 0.05 % MT LIQD: 7.0000 mL | Freq: Two times a day (BID) | OROMUCOSAL | Status: DC

## 2014-04-27 MED ORDER — FENTANYL CITRATE 0.05 MG/ML IJ SOLN
INTRAMUSCULAR | Status: AC
  Filled 2014-04-27: qty 4

## 2014-04-27 MED ORDER — SODIUM CHLORIDE 0.9 % IV SOLN: INTRAVENOUS | Status: DC

## 2014-04-27 MED ORDER — FENTANYL CITRATE 0.05 MG/ML IJ SOLN
INTRAMUSCULAR | Status: DC | PRN
  Administered 2014-04-27 (×4): 25 ug via INTRAVENOUS

## 2014-04-27 MED ORDER — DIPHENHYDRAMINE HCL 50 MG/ML IJ SOLN
INTRAMUSCULAR | Status: DC | PRN
  Administered 2014-04-27 (×2): 25 mg via INTRAVENOUS

## 2014-04-27 MED ORDER — LORAZEPAM 2 MG/ML IJ SOLN
1.0000 mg | INTRAMUSCULAR | Status: DC | PRN
  Administered 2014-04-27 – 2014-04-29 (×7): 2 mg via INTRAVENOUS
  Filled 2014-04-27 (×8): qty 1

## 2014-04-27 MED ORDER — MIDAZOLAM HCL 5 MG/ML IJ SOLN
INTRAMUSCULAR | Status: AC
  Filled 2014-04-27: qty 3

## 2014-04-27 MED ORDER — LEVOTHYROXINE SODIUM 100 MCG IV SOLR
25.0000 ug | Freq: Every day | INTRAVENOUS | Status: DC
  Administered 2014-04-27 – 2014-04-29 (×3): 25 ug via INTRAVENOUS
  Filled 2014-04-27 (×3): qty 5

## 2014-04-27 MED ORDER — MIDAZOLAM HCL 10 MG/2ML IJ SOLN
INTRAMUSCULAR | Status: DC | PRN
  Administered 2014-04-27 (×5): 2 mg via INTRAVENOUS

## 2014-04-27 MED ORDER — BUTAMBEN-TETRACAINE-BENZOCAINE 2-2-14 % EX AERO
INHALATION_SPRAY | CUTANEOUS | Status: DC | PRN
  Administered 2014-04-27: 1 via TOPICAL

## 2014-04-27 MED ORDER — DIPHENHYDRAMINE HCL 50 MG/ML IJ SOLN
INTRAMUSCULAR | Status: AC
  Filled 2014-04-27: qty 1

## 2014-04-27 MED ORDER — ALBUTEROL SULFATE (2.5 MG/3ML) 0.083% IN NEBU: 2.5000 mg | INHALATION_SOLUTION | RESPIRATORY_TRACT | Status: DC | PRN

## 2014-04-27 MED ORDER — SODIUM CHLORIDE 0.9 % IV SOLN
INTRAVENOUS | Status: DC
  Administered 2014-04-27 – 2014-04-29 (×4): via INTRAVENOUS

## 2014-04-27 MED ORDER — SODIUM CHLORIDE 0.9 % IV BOLUS (SEPSIS)
4000.0000 mL | Freq: Once | INTRAVENOUS | Status: AC
  Administered 2014-04-27: 2000 mL via INTRAVENOUS

## 2014-04-27 NOTE — Consult Note (Signed)
Referring Provider: Dr. Rubin Payor Primary Care Physician:  Lilyan Punt, MD Primary Gastroenterologist:  Gentry Fitz  Reason for Consultation:  Hematemesis; GI bleed  HPI: Kimberly Weiss is a 34 y.o. female with the acute onset of hematemesis described as 2 large volume episodes and passing out afterwards. Reports red blood in stool in the ICU. Epigastric pain. Denies melena. No history of peptic ulcer disease. Takes Excedrin daily and chronic narcotic pain meds for back pain. Hgb 8.5 (15 in September 2015). Occasional heartburn over the last month. Hypotensive in the 80's systolic and tachycardic in the 130's. Rare alcohol. Denies illicit drugs.   Past Medical History  Diagnosis Date  . Renal disorder   . Interstitial cystitis   . Thyroid disease   . Hypothyroidism   . Renal stones   . Back pain     History reviewed. No pertinent past surgical history.  Prior to Admission medications   Medication Sig Start Date End Date Taking? Authorizing Provider  albuterol (PROVENTIL HFA;VENTOLIN HFA) 108 (90 BASE) MCG/ACT inhaler Inhale 2 puffs into the lungs every 6 (six) hours as needed for wheezing or shortness of breath.   Yes Historical Provider, MD  amitriptyline (ELAVIL) 50 MG tablet TAKE 1 TABLET (50 MG TOTAL) BY MOUTH AT BEDTIME. 03/04/14  Yes Babs Sciara, MD  Aspirin-Acetaminophen-Caffeine (EXCEDRIN PO) Take 2 tablets by mouth 2 (two) times daily as needed. For pain   Yes Historical Provider, MD  clonazePAM (KLONOPIN) 1 MG tablet 1/2-1 po BID prn panic attacks 04/01/14  Yes Campbell Riches, NP  hydrochlorothiazide (HYDRODIURIL) 25 MG tablet Take 25 mg by mouth daily.   Yes Historical Provider, MD  levothyroxine (SYNTHROID, LEVOTHROID) 50 MCG tablet TAKE 1 TABLET EVERY DAY 04/17/14  Yes Babs Sciara, MD  oxyCODONE-acetaminophen (PERCOCET) 10-325 MG per tablet Take 1 tablet by mouth every 6 (six) hours as needed for pain. 01/28/14  Yes Babs Sciara, MD  venlafaxine XR (EFFEXOR-XR)  150 MG 24 hr capsule Take 150 mg by mouth daily with breakfast.   Yes Historical Provider, MD  amitriptyline (ELAVIL) 50 MG tablet TAKE 1 TABLET (50 MG TOTAL) BY MOUTH AT BEDTIME. 04/01/14   Merlyn Albert, MD  ranitidine (ZANTAC) 150 MG capsule Take 1 capsule (150 mg total) by mouth daily. Patient not taking: Reported on 04/26/2014 11/15/13   Roxy Horseman, PA-C  sucralfate (CARAFATE) 1 GM/10ML suspension Take 10 mLs (1 g total) by mouth 4 (four) times daily -  with meals and at bedtime. Patient not taking: Reported on 04/26/2014 11/15/13   Roxy Horseman, PA-C    Scheduled Meds: . [facility] Hold] antiseptic oral rinse  7 mL Mouth Rinse q12n4p  . [MAR Hold] chlorhexidine  15 mL Mouth Rinse BID  . [MAR Hold] folic acid  1 mg Oral Daily   Continuous Infusions: . pantoprozole (PROTONIX) infusion 8 mg/hr (04/27/14 0257)   PRN Meds:.[MAR Hold] sodium chloride  Allergies as of 04/26/2014 - Review Complete 04/26/2014  Allergen Reaction Noted  . Gabapentin Other (See Comments) 07/19/2013  . Zofran [ondansetron hcl] Other (See Comments) 01/22/2013    History reviewed. No pertinent family history.  History   Social History  . Marital Status: Single    Spouse Name: N/A  . Number of Children: N/A  . Years of Education: N/A   Occupational History  . Not on file.   Social History Main Topics  . Smoking status: Current Every Day Smoker  . Smokeless tobacco: Not on file  . Alcohol Use:  Yes     Comment: rare occas  . Drug Use: No  . Sexual Activity: Not on file   Other Topics Concern  . Not on file   Social History Narrative    Review of Systems: All negative except as stated above in HPI.  Physical Exam: Vital signs: Filed Vitals:   04/27/14 0757  BP: 109/76  Pulse: 105  Temp: 98.5 F (36.9 C)  Resp: 16   Last BM Date: 04/26/14 General:   Lethargic, Well-developed, well-nourished, pleasant and cooperative in NAD HEENT: oropharynx clear Neck: supple, nontender Lungs:   Clear throughout to auscultation.   No wheezes, crackles, or rhonchi. No acute distress. Heart:  Regular rate and rhythm; no murmurs, clicks, rubs,  or gallops. Abdomen: epigastric tenderness with minimal guarding, soft, nondistended, +BS  Rectal:  Deferred Ext: no edema  GI:  Lab Results:  Recent Labs  04/26/14 2314 04/26/14 2322  WBC 32.6*  --   HGB 8.5* 9.5*  HCT 25.8* 28.0*  PLT 356  --    BMET  Recent Labs  04/26/14 2314 04/26/14 2322  NA 138 141  K 4.2 4.2  CL 111 109  CO2 20  --   GLUCOSE 120* 117*  BUN 15 18  CREATININE 0.89 0.80  CALCIUM 7.4*  --    LFT  Recent Labs  04/26/14 2314  PROT 4.5*  ALBUMIN 2.4*  AST 17  ALT 11  ALKPHOS 66  BILITOT 0.1*   PT/INR  Recent Labs  04/26/14 2314  LABPROT 14.9  INR 1.16     Studies/Results: No results found.  Impression/Plan: 34 yo with upper GI bleed (hematemesis) concerning for a peptic ulcer bleed. EGD now. Continue Protonix infusion. NPO. Supportive care.    LOS: 0 days   Jakye Mullens C.  04/27/2014, 8:06 AM

## 2014-04-27 NOTE — Op Note (Signed)
[facility] [address] [place], [id number]   ENDOSCOPY PROCEDURE REPORT  PATIENT: Kimberly Weiss, Kimberly Weiss  MR#: [id number] BIRTHDATE: 06-Oct-1980 , 33  yrs. old GENDER: female ENDOSCOPIST: Charlott RakesVincent Marigene Erler, MD REFERRED BY:  hospital team PROCEDURE DATE:  04/27/2014 PROCEDURE:  EGD, diagnostic ASA CLASS:     Class II INDICATIONS:  hematemesis. MEDICATIONS: Benadryl 50 mg IV, Fentanyl 100 mcg IV, and Versed 10 mg IV TOPICAL ANESTHETIC: Cetacaine Spray  DESCRIPTION OF PROCEDURE: After the risks benefits and alternatives of the procedure were thoroughly explained, informed consent was obtained.  The [employer] [id number] endoscope was introduced through the mouth and advanced to the second portion of the duodenum , Without limitations.  The instrument was slowly withdrawn as the mucosa was fully examined.    Esophagus and GEJ normal in appearance and GEJ 38 cm from the incisors. Large amount of dark brown and maroon-colored blood in stomach that could not be irrigated away obscuring the majority of the gastric body. Pyloric channel was very edematous and upon entering the duodenal bulb there was a massive cratered ulcer that covered 2/3 of the circumference of the bulb. Large amount of old blood and clots was adherent to the ulcer base obscuring views of it. Small amount of red blood seen without a definite visible vessel found but again views were very limited. Bulb was deformed due to massive cratered ulcer. No definite active bleeding seen. 2nd part of the duodenum was normal.      Retroflexed views revealed limited view of fundus due to old blood pooling in the dependent portion of the stomach. No cardia abnormalities seen. The scope was then withdrawn from the patient and the procedure completed.  COMPLICATIONS: There were no immediate complications.  ENDOSCOPIC IMPRESSION:     Massive Duodenal Bulb Ulcer bleed - see above Ulcer likely due  to NSAIDs  RECOMMENDATIONS:     If rebleeds, then will need embolization and if that is not successful will need surgery; Continue Protonix infusion; Follow H/Hs; Strict NPO; Supportive care   eSigned:  Charlott RakesVincent Luis Nickles, MD 04/27/2014 9:23 AM    CC:  CPT CODES: ICD CODES:  The ICD and CPT codes recommended by this software are interpretations from the data that the clinical staff has captured with the software.  The verification of the translation of this report to the ICD and CPT codes and modifiers is the sole responsibility of the health care institution and practicing physician where this report was generated.  [employer] will not be held responsible for the validity of the ICD and CPT codes included on this report.  AMA assumes no liability for data contained or not contained herein. CPT is a Publishing rights managerregistered trademark of the [employer].  PATIENT NAME:  Kimberly Weiss, Kimberly Weiss MR#: [id number]

## 2014-04-27 NOTE — Progress Notes (Signed)
Pt educated on reasons and importance to ask for [facility] by multiple nurses multiple times.  Patient verbalizes  understanding but gets OOB without asking for help anyway.  Patient educated on reasons and importance for NPO status by multiple hospital staff multiple times.  Patient verbalizes understanding but drinks water out of the sink.  Bed alarm in use during day and now d/t patient non compliance.  Patient exhibits emotional swings from anger to crying to sleeping.

## 2014-04-27 NOTE — Progress Notes (Signed)
PULMONARY / CRITICAL CARE MEDICINE   Name: Kimberly Weiss MRN: [id number] DOB: 09/22/1980    ADMISSION DATE:  04/26/2014  PRIMARY SERVICE: PCCM  CHIEF COMPLAINT:  Syncope, hematemesis  BRIEF PATIENT DESCRIPTION: 34 y/o woman admitted 2/26 after syncopal episode.  Work up concerning for UGI bleed likely due to NSAID usage. PCCM consulted for ICU admission.   SIGNIFICANT EVENTS / STUDIES:  2/26  Admit with syncope prior to presentation, concern for UGIB.  Hgb 8.2 from 15  SUBJECTIVE: Pt repetitively asking for food/drink.  RN reports no acute events.  Tolerated EGD well (required high doses of narcotics and was reportedly awake during procedure).    VITAL SIGNS: Temp:  [97.5 F (36.4 C)-98.7 F (37.1 C)] 98.3 F (36.8 C) (02/27 0853) Pulse Rate:  [97-135] 107 (02/27 0853) Resp:  [12-22] 21 (02/27 0853) BP: (72-109)/(43-76) 95/54 mmHg (02/27 0851) SpO2:  [96 %-100 %] 100 % (02/27 0853) Weight:  [164 lb 3.9 oz (74.5 kg)] 164 lb 3.9 oz (74.5 kg) (02/27 0130)   INTAKE / OUTPUT: Intake/Output      02/26 0701 - 02/27 0700 02/27 0701 - 02/28 0700   I.V. (mL/kg) 71.3 (1) 200 (2.7)   Blood 598.5    Other 2000    Total Intake(mL/kg) 2669.8 (35.8) 200 (2.7)   Net +2669.8 +200        Urine Occurrence 4 x    Stool Occurrence 2 x      PHYSICAL EXAMINATION: General:  Pale appearing F, anxious-appearing Neuro:  CN grossly intact, mentating well. HEENT:  Mm with dried blood Neck: No JVD Cardiovascular:  s1s2 rrr, tachy, no m/r/g Lungs:  CTAB Abdomen:  Soft, mildly tender to palpation, hyperactive bowel sounds Musculoskeletal:  No deformities Skin:  No rashes  LABS:  CBC  Recent Labs Lab 04/26/14 2314 04/26/14 2322  WBC 32.6*  --   HGB 8.5* 9.5*  HCT 25.8* 28.0*  PLT 356  --    Coag's  Recent Labs Lab 04/26/14 2314  INR 1.16   BMET  Recent Labs Lab 04/26/14 2314 04/26/14 2322  NA 138 141  K 4.2 4.2  CL 111 109  CO2 20  --   BUN 15 18  CREATININE 0.89  0.80  GLUCOSE 120* 117*   Electrolytes  Recent Labs Lab 04/26/14 2314  CALCIUM 7.4*   Sepsis Markers  Recent Labs Lab 04/26/14 2322  LATICACIDVEN 2.57*   Liver Enzymes  Recent Labs Lab 04/26/14 2314  AST 17  ALT 11  ALKPHOS 66  BILITOT 0.1*  ALBUMIN 2.4*   Glucose  Recent Labs Lab 04/27/14 0126  GLUCAP 84    Imaging No results found.   ASSESSMENT / PLAN:  PULMONARY A: Chronic Tobacco Abuse P:   Smoking cessation counseling  PRN albuterol for wheezing    CARDIOVASCULAR A: Hypovolemic shock - resolved.  P:   NS @ 50 ml/hr   RENAL A: Lactic Acidosis - resolving  P:   Gentle hydration Follow BMP Replace electrolytes as indicated   GASTROINTESTINAL A: Major Upper GI Bleed - likely in setting of NSAID usage.  Massive cratered duodenal bulb ulcer noted on EGD P:   Protonix gtt GI Following, appreciate assistance If re-bleed, IR would embolize and if not successful would require surgery NPO Defer restart of intake to GI  HEMATOLOGIC A: Anemia - due to hemorrhage. P:   S/P 2 units PRBC Follow CBC Q4 for now  Tx for Hgb <7% or further active bleeding  INFECTIOUS A: No active issues P:   Monitor fever curve / WBC  ENDOCRINE A: Hypothyroidism P:   Synthroid 25 mcg IV, transition back to PO once cleared for intake   NEUROLOGIC A: Anxiety BZDD dependence Opiate dependence P:   Hold home meds: elavil, klonopin, percocet 10, effexor PRN IV ativan for anxiety / evidence of withdrawal    Canary Brim, NP-C [facility] Pgr: [phone] or [phone]  Reviewed above, examined.  She c/o feeling thirsty, back pain, and burning in her throat.  Chest clear, mild epigastric tenderness.  Continue NPO until okay by GI.  If rebleeds then consult IR to assess for embolization.  Continue IV fluids.  F/u CBC.  No NSAIDS.  She needs to stop smoking, drinking alcohol.  CC time by me independent of APP time is 35  minutes.  D/w Dr. Bosie Clos.  Coralyn Helling, MD [facility] Pulmonary/Critical Care 04/27/2014, 11:47 AM Pager:  [phone] After 3pm call: [phone]

## 2014-04-27 NOTE — Brief Op Note (Addendum)
Massive cratered duodenal bulb ulcer with large amount of dark blood and dark clots and small amount of red blood noted adherent to the ulcer base. Unable to visualize any active bleeding or visible vessel but base mostly obscured by old blood. See endopro for details. Keep strict NPO. If rebleeds, then will need embolization by IR and if that is not successful, then will need surgery. D/W CCM (Dr. Craige CottaSood) and also with IR (Dr. Archer AsaMcCullough).

## 2014-04-27 NOTE — H&P (Signed)
PULMONARY / CRITICAL CARE MEDICINE HISTORY AND PHYSICAL EXAMINATION   Name: Kimberly Weiss MRN: [id number] DOB: 07-31-1980    ADMISSION DATE:  04/26/2014  PRIMARY SERVICE: PCCM  CHIEF COMPLAINT:  Syncope, hematemesis  BRIEF PATIENT DESCRIPTION: 34 y/o woman with UGI bleed likely due to NSAID use  SIGNIFICANT EVENTS / STUDIES:  Syncope prior to presentation Hb of 15 (baseline) -> 8.2  LINES / TUBES: PIV (18, 20, & 22 ga)  CULTURES: None  ANTIBIOTICS: None  HISTORY OF PRESENT ILLNESS:   Kimberly Weiss is a 34 y/o woman with a history of chronic back pain, hypothyroidism, and pelvic pain (interstitial cystitis) and chronic opiate dependence who presented to the ED after sycnope and large volume hematemesis. The patient reports that since increasing her opiates a few weeks ago, she has been more constipated, and has been taking TUMS and GasEx for bloating. When asked about laxitives and bowel regimen, she seemed to indicate a particular aversion to these. While before she had been having 1 BM daily, she reports going every 3-4 days and as long as a week over the last month or so. She also has been taking Excedrin (unclear strength) daily for the last month, and reports taking regularly for some years. She has never had GI bleed before. She reports she did have GERD while pregnant and was on a PPI temporarily. She felt a little nauseated on the day of admission, and then lightheaded, and then an episode of syncope, followed by hematemasis. She has had no melena. She was brought to the ED where she was hypotensive and tachycardic, and found to have a Hb of 8.5 from a baseline of 15.  PAST MEDICAL HISTORY :  Past Medical History  Diagnosis Date  . Renal disorder   . Interstitial cystitis   . Thyroid disease    History reviewed. No pertinent past surgical history. Prior to Admission medications   Medication Sig Start Date End Date Taking? Authorizing Provider  albuterol (PROVENTIL  HFA;VENTOLIN HFA) 108 (90 BASE) MCG/ACT inhaler Inhale 2 puffs into the lungs every 6 (six) hours as needed for wheezing or shortness of breath.   Yes Historical Provider, MD  amitriptyline (ELAVIL) 50 MG tablet TAKE 1 TABLET (50 MG TOTAL) BY MOUTH AT BEDTIME. 03/04/14  Yes Babs Sciara, MD  Aspirin-Acetaminophen-Caffeine (EXCEDRIN PO) Take 2 tablets by mouth 2 (two) times daily as needed. For pain   Yes Historical Provider, MD  clonazePAM (KLONOPIN) 1 MG tablet 1/2-1 po BID prn panic attacks 04/01/14  Yes Campbell Riches, NP  hydrochlorothiazide (HYDRODIURIL) 25 MG tablet Take 25 mg by mouth daily.   Yes Historical Provider, MD  levothyroxine (SYNTHROID, LEVOTHROID) 50 MCG tablet TAKE 1 TABLET EVERY DAY 04/17/14  Yes Babs Sciara, MD  oxyCODONE-acetaminophen (PERCOCET) 10-325 MG per tablet Take 1 tablet by mouth every 6 (six) hours as needed for pain. 01/28/14  Yes Babs Sciara, MD  venlafaxine XR (EFFEXOR-XR) 150 MG 24 hr capsule Take 150 mg by mouth daily with breakfast.   Yes Historical Provider, MD  amitriptyline (ELAVIL) 50 MG tablet TAKE 1 TABLET (50 MG TOTAL) BY MOUTH AT BEDTIME. 04/01/14   Merlyn Albert, MD  ranitidine (ZANTAC) 150 MG capsule Take 1 capsule (150 mg total) by mouth daily. Patient not taking: Reported on 04/26/2014 11/15/13   Roxy Horseman, PA-C  sucralfate (CARAFATE) 1 GM/10ML suspension Take 10 mLs (1 g total) by mouth 4 (four) times daily -  with meals and at bedtime.  Patient not taking: Reported on 04/26/2014 11/15/13   Roxy Horseman, PA-C   Allergies  Allergen Reactions  . Gabapentin Other (See Comments)    "Walking in a fog"  . Zofran [Ondansetron Hcl] Other (See Comments)    Loopy / Spaced out feeling    FAMILY HISTORY:  History reviewed. No pertinent family history. SOCIAL HISTORY:  reports that she has been smoking.  She does not have any smokeless tobacco history on file. She reports that she drinks alcohol. She reports that she does not use illicit  drugs.  REVIEW OF SYSTEMS:  As per HPI.  SUBJECTIVE:   VITAL SIGNS: Temp:  [97.5 F (36.4 C)-97.9 F (36.6 C)] 97.5 F (36.4 C) (02/27 0045) Pulse Rate:  [100-135] 100 (02/27 0045) Resp:  [13-18] 18 (02/27 0045) BP: (83-94)/(52-57) 94/55 mmHg (02/27 0045) SpO2:  [100 %] 100 % (02/27 0045) HEMODYNAMICS:   VENTILATOR SETTINGS:   INTAKE / OUTPUT: Intake/Output    None     PHYSICAL EXAMINATION: General:  Pale appearing woman, anxious-appearing Neuro:  CN grossly intact, mentating well. HEENT:  Dried blood around nose and mouth Neck: No JVD Cardiovascular:  Tachycardic Lungs:  CTAB Abdomen:  Soft, mildly tender, hyperactive bowel sounds Musculoskeletal:  No deformities Skin:  No rashes  LABS:  CBC  Recent Labs Lab 04/26/14 2314 04/26/14 2322  WBC 32.6*  --   HGB 8.5* 9.5*  HCT 25.8* 28.0*  PLT 356  --    Coag's  Recent Labs Lab 04/26/14 2314  INR 1.16   BMET  Recent Labs Lab 04/26/14 2314 04/26/14 2322  NA 138 141  K 4.2 4.2  CL 111 109  CO2 20  --   BUN 15 18  CREATININE 0.89 0.80  GLUCOSE 120* 117*   Electrolytes  Recent Labs Lab 04/26/14 2314  CALCIUM 7.4*   Sepsis Markers  Recent Labs Lab 04/26/14 2322  LATICACIDVEN 2.57*   ABG No results for input(s): PHART, PCO2ART, PO2ART in the last 168 hours. Liver Enzymes  Recent Labs Lab 04/26/14 2314  AST 17  ALT 11  ALKPHOS 66  BILITOT 0.1*  ALBUMIN 2.4*   Cardiac Enzymes No results for input(s): TROPONINI, PROBNP in the last 168 hours. Glucose No results for input(s): GLUCAP in the last 168 hours.  Imaging No results found.  EKG: Sinus tach CXR: Pending  ASSESSMENT / PLAN:  Active Problems:   UGIB (upper gastrointestinal bleed)   PULMONARY A: Chronic Tobacco Abuse P:   Defer to outpatient management.   CARDIOVASCULAR A: Hypovolemic shock P:   Ongoing resuscitation efforts. Holding home meds which may worsen CV tone.  RENAL A: No acute issues P:      GASTROINTESTINAL A: Major Upper GI Bleed P:   PPI gtt. Favor etiology to be related to NSAID use. Patient is stable for now w/ improved BP/HR with fluids. Type and Screen is UTD, getting 2 U of PRBCs. ED discussed with GI; plan for now is to evaluate for endoscopy in the morning.  HEMATOLOGIC A: Anemia due to hemorrhage. P:   Undergoing transfusion. Will monitor. May have poor plt function due to ASA use.  INFECTIOUS A: No active issues P:     ENDOCRINE A: Mild hypothyroidism P:   Holding thyroid replacement therapy in setting of acute bleed. Restart once able to safely take PO medications.  NEUROLOGIC A: Anxiety BZDD dependence Opiate dependence P:   CIWA for BZD withdrawal measurement. None ordered for now. Opiates can be restarted following procedure.  BEST PRACTICE / DISPOSITION Level of Care:  ICU Primary Service:  PCCM Consultants:  GI Code Status:  Full Diet:  NPO DVT Px:  On  GI Px:  PPI gtt Skin Integrity:  Intact Social / Family:  Father updated on admission  TODAY'S SUMMARY: 34 y/o woman with upper GI bleed, likely due to NSAID use.  I have personally obtained a history, examined the patient, evaluated laboratory and imaging results, formulated the assessment and plan and placed orders.  CRITICAL CARE: The patient is critically ill with multiple organ systems failure and requires high complexity decision making for assessment and support, frequent evaluation and titration of therapies, application of advanced monitoring technologies and extensive interpretation of multiple databases. Critical Care Time devoted to patient care services described in this note is 93 minutes.   Jamie KatoAaron Jazma Pickel, MD Pulmonary and Critical Care Medicine [facility] Pager: [phone]   04/27/2014, 12:53 AM

## 2014-04-27 NOTE — Interval H&P Note (Signed)
History and Physical Interval Note:  04/27/2014 8:19 AM  Kimberly CookeyKatherine N Henery  has presented today for surgery, with the diagnosis of upper gi bleed  The various methods of treatment have been discussed with the patient and family. After consideration of risks, benefits and other options for treatment, the patient has consented to  Procedure(s): ESOPHAGOGASTRODUODENOSCOPY (EGD) (N/A) as a surgical intervention .  The patient's history has been reviewed, patient examined, no change in status, stable for surgery.  I have reviewed the patient's chart and labs.  Questions were answered to the patient's satisfaction.     Pranshu Lyster C.

## 2014-04-27 NOTE — Consult Note (Signed)
Chief Complaint: Chief Complaint  Patient presents with  . Hypotension  UGI bleed Duodenal ulcer  Referring Physician(s): Dr Bosie ClosSchooler  History of Present Illness: Kimberly Weiss is a [age] y.o. female  Pt passed out after large hematemesis at home last pm To ED Drop in hgb Endoscopy this am reveals large duodenal ulcer with clot Request made to consult pt for possible mesenteric arteriogram with embolization in  event of rebleed Dr Archer AsaMcCullough has discussed with ordering MD I have seen and examined pt   Past Medical History  Diagnosis Date  . Renal disorder   . Interstitial cystitis   . Thyroid disease   . Hypothyroidism   . Renal stones   . Back pain     History reviewed. No pertinent past surgical history.  Allergies: Gabapentin and Zofran  Medications: Prior to Admission medications   Medication Sig Start Date End Date Taking? Authorizing Provider  albuterol (PROVENTIL HFA;VENTOLIN HFA) 108 (90 BASE) MCG/ACT inhaler Inhale 2 puffs into the lungs every 6 (six) hours as needed for wheezing or shortness of breath.   Yes Historical Provider, MD  amitriptyline (ELAVIL) 50 MG tablet TAKE 1 TABLET (50 MG TOTAL) BY MOUTH AT BEDTIME. 03/04/14  Yes Babs SciaraScott A Luking, MD  Aspirin-Acetaminophen-Caffeine (EXCEDRIN PO) Take 2 tablets by mouth 2 (two) times daily as needed. For pain   Yes Historical Provider, MD  clonazePAM (KLONOPIN) 1 MG tablet 1/2-1 po BID prn panic attacks 04/01/14  Yes Campbell Richesarolyn C Hoskins, NP  hydrochlorothiazide (HYDRODIURIL) 25 MG tablet Take 25 mg by mouth daily.   Yes Historical Provider, MD  levothyroxine (SYNTHROID, LEVOTHROID) 50 MCG tablet TAKE 1 TABLET EVERY DAY 04/17/14  Yes Babs SciaraScott A Luking, MD  oxyCODONE-acetaminophen (PERCOCET) 10-325 MG per tablet Take 1 tablet by mouth every 6 (six) hours as needed for pain. 01/28/14  Yes Babs SciaraScott A Luking, MD  venlafaxine XR (EFFEXOR-XR) 150 MG 24 hr capsule Take 150 mg by mouth daily with breakfast.   Yes Historical  Provider, MD  amitriptyline (ELAVIL) 50 MG tablet TAKE 1 TABLET (50 MG TOTAL) BY MOUTH AT BEDTIME. 04/01/14   Merlyn AlbertWilliam S Luking, MD  ranitidine (ZANTAC) 150 MG capsule Take 1 capsule (150 mg total) by mouth daily. Patient not taking: Reported on 04/26/2014 11/15/13   Roxy Horsemanobert Browning, PA-C  sucralfate (CARAFATE) 1 GM/10ML suspension Take 10 mLs (1 g total) by mouth 4 (four) times daily -  with meals and at bedtime. Patient not taking: Reported on 04/26/2014 11/15/13   Roxy Horsemanobert Browning, PA-C     History reviewed. No pertinent family history.  History   Social History  . Marital Status: Single    Spouse Name: N/A  . Number of Children: N/A  . Years of Education: N/A   Social History Main Topics  . Smoking status: Current Every Day Smoker  . Smokeless tobacco: Not on file  . Alcohol Use: Yes     Comment: rare occas  . Drug Use: No  . Sexual Activity: Not on file   Other Topics Concern  . None   Social History Narrative    Review of Systems: A 12 point ROS discussed and pertinent positives are indicated in the HPI above.  All other systems are negative.  Review of Systems  Constitutional: Negative for fever and activity change.  Respiratory: Negative for shortness of breath.   Gastrointestinal: Positive for nausea, vomiting and abdominal pain.       Hematemesis  Genitourinary: Negative for hematuria and difficulty urinating.  Musculoskeletal: Positive for back pain.  Neurological: Positive for weakness.  Psychiatric/Behavioral: Negative for behavioral problems and confusion.    Vital Signs: BP 95/54 mmHg  Pulse 107  Temp(Src) 98.1 F (36.7 C) (Oral)  Resp 21  Ht  (1.6 m)  Wt 74.5 kg (164 lb 3.9 oz)  BMI 29.10 kg/m2  SpO2 100%  LMP 04/12/2014  Physical Exam  Constitutional: She is oriented to person, place, and time. She appears well-nourished.  Cardiovascular: Normal rate, regular rhythm and normal heart sounds.   Pulmonary/Chest: Effort normal and breath sounds  normal. She has no wheezes.  Abdominal: Soft.  Musculoskeletal: Normal range of motion.  Neurological: She is alert and oriented to person, place, and time.  Skin: Skin is warm and dry.  Psychiatric: She has a normal mood and affect. Her behavior is normal. Judgment and thought content normal.  Nursing note and vitals reviewed.   Mallampati Score:  MD Evaluation Airway: WNL Heart: WNL Abdomen: WNL Abdomen comments: epigastric tenderness with guarding, soft, nondistended, +BS Chest/ Lungs: WNL ASA  Classification: 2 Mallampati/Airway Score: One  Imaging: No results found.  Labs:  CBC:  Recent Labs  11/14/13 1943 11/14/13 2000 04/26/14 2314 04/26/14 2322  WBC 10.5  --  32.6*  --   HGB 14.2 15.3* 8.5* 9.5*  HCT 42.7 45.0 25.8* 28.0*  PLT 353  --  356  --     COAGS:  Recent Labs  04/26/14 2314  INR 1.16    BMP:  Recent Labs  11/14/13 2000 11/14/13 2221 04/26/14 2314 04/26/14 2322  NA 139 138 138 141  K 4.0 3.9 4.2 4.2  CL 107 102 111 109  CO2  --  21 20  --   GLUCOSE 81 84 120* 117*  BUN CALCIUM  --  9.0 7.4*  --   CREATININE 0.70 0.67 0.89 0.80  GFRNONAA  --  >90 84*  --   GFRAA  --  >90 >90  --     LIVER FUNCTION TESTS:  Recent Labs  11/14/13 2221 04/26/14 2314  BILITOT 0.2* 0.1*  AST 17 17  ALT 11 11  ALKPHOS 82 66  PROT 6.6 4.5*  ALBUMIN 3.5 2.4*    TUMOR MARKERS: No results for input(s): AFPTM, CEA, CA199, CHROMGRNA in the last 8760 hours.  Assessment and Plan:  Upper GI bleed + large duodenal ulcer with clot on scope Prepared for possible mesenteric arteriogram with embolization if needed Pt aware of procedure benefits and risks including but not limited to Infection; bleeding; vessel damage Agreeable to proceed Consent signed andin chart----if needed  Thank you for this interesting consult.  I greatly enjoyed meeting TAKIA RUNYON and look forward to participating in their care.  Signed: Eustolia Drennen  A 04/27/2014, 11:34 AM   I spent a total of 40 Minutes  in face to face in clinical consultation, greater than 50% of which was counseling/coordinating care for mesenteric arteriogram with embolization

## 2014-04-27 NOTE — ED Notes (Signed)
Consent signed and at bedside  

## 2014-04-27 NOTE — H&P (View-Only) (Signed)
Referring Provider: Dr. Pickering Primary Care Physician:  LUKING,SCOTT, MD Primary Gastroenterologist:  Unassigned  Reason for Consultation:  Hematemesis; GI bleed  HPI: Kimberly Weiss is a 33 y.o. female with the acute onset of hematemesis described as 2 large volume episodes and passing out afterwards. Reports red blood in stool in the ICU. Epigastric pain. Denies melena. No history of peptic ulcer disease. Takes Excedrin daily and chronic narcotic pain meds for back pain. Hgb 8.5 (15 in September 2015). Occasional heartburn over the last month. Hypotensive in the 80's systolic and tachycardic in the 130's. Rare alcohol. Denies illicit drugs.   Past Medical History  Diagnosis Date  . Renal disorder   . Interstitial cystitis   . Thyroid disease   . Hypothyroidism   . Renal stones   . Back pain     History reviewed. No pertinent past surgical history.  Prior to Admission medications   Medication Sig Start Date End Date Taking? Authorizing Provider  albuterol (PROVENTIL HFA;VENTOLIN HFA) 108 (90 BASE) MCG/ACT inhaler Inhale 2 puffs into the lungs every 6 (six) hours as needed for wheezing or shortness of breath.   Yes Historical Provider, MD  amitriptyline (ELAVIL) 50 MG tablet TAKE 1 TABLET (50 MG TOTAL) BY MOUTH AT BEDTIME. 03/04/14  Yes Scott A Luking, MD  Aspirin-Acetaminophen-Caffeine (EXCEDRIN PO) Take 2 tablets by mouth 2 (two) times daily as needed. For pain   Yes Historical Provider, MD  clonazePAM (KLONOPIN) 1 MG tablet 1/2-1 po BID prn panic attacks 04/01/14  Yes [name] C Hoskins, NP  hydrochlorothiazide (HYDRODIURIL) 25 MG tablet Take 25 mg by mouth daily.   Yes Historical Provider, MD  levothyroxine (SYNTHROID, LEVOTHROID) 50 MCG tablet TAKE 1 TABLET EVERY DAY 04/17/14  Yes Scott A Luking, MD  oxyCODONE-acetaminophen (PERCOCET) 10-325 MG per tablet Take 1 tablet by mouth every 6 (six) hours as needed for pain. 01/28/14  Yes Scott A Luking, MD  venlafaxine XR (EFFEXOR-XR)  150 MG 24 hr capsule Take 150 mg by mouth daily with breakfast.   Yes Historical Provider, MD  amitriptyline (ELAVIL) 50 MG tablet TAKE 1 TABLET (50 MG TOTAL) BY MOUTH AT BEDTIME. 04/01/14   William S Luking, MD  ranitidine (ZANTAC) 150 MG capsule Take 1 capsule (150 mg total) by mouth daily. Patient not taking: Reported on 04/26/2014 11/15/13   Robert Browning, PA-C  sucralfate (CARAFATE) 1 GM/10ML suspension Take 10 mLs (1 g total) by mouth 4 (four) times daily -  with meals and at bedtime. Patient not taking: Reported on 04/26/2014 11/15/13   Robert Browning, PA-C    Scheduled Meds: . [MAR Hold] antiseptic oral rinse  7 mL Mouth Rinse q12n4p  . [MAR Hold] chlorhexidine  15 mL Mouth Rinse BID  . [MAR Hold] folic acid  1 mg Oral Daily   Continuous Infusions: . pantoprozole (PROTONIX) infusion 8 mg/hr (04/27/14 0257)   PRN Meds:.[MAR Hold] sodium chloride  Allergies as of 04/26/2014 - Review Complete 04/26/2014  Allergen Reaction Noted  . Gabapentin Other (See Comments) 07/19/2013  . Zofran [ondansetron hcl] Other (See Comments) 01/22/2013    History reviewed. No pertinent family history.  History   Social History  . Marital Status: Single    Spouse Name: N/A  . Number of Children: N/A  . Years of Education: N/A   Occupational History  . Not on file.   Social History Main Topics  . Smoking status: Current Every Day Smoker  . Smokeless tobacco: Not on file  . Alcohol Use:   Yes     Comment: rare occas  . Drug Use: No  . Sexual Activity: Not on file   Other Topics Concern  . Not on file   Social History Narrative    Review of Systems: All negative except as stated above in HPI.  Physical Exam: Vital signs: Filed Vitals:   04/27/14 0757  BP: 109/76  Pulse: 105  Temp: 98.5 F (36.9 C)  Resp: 16   Last BM Date: 04/26/14 General:   Lethargic, Well-developed, well-nourished, pleasant and cooperative in NAD HEENT: oropharynx clear Neck: supple, nontender Lungs:   Clear throughout to auscultation.   No wheezes, crackles, or rhonchi. No acute distress. Heart:  Regular rate and rhythm; no murmurs, clicks, rubs,  or gallops. Abdomen: epigastric tenderness with minimal guarding, soft, nondistended, +BS  Rectal:  Deferred Ext: no edema  GI:  Lab Results:  Recent Labs  04/26/14 2314 04/26/14 2322  WBC 32.6*  --   HGB 8.5* 9.5*  HCT 25.8* 28.0*  PLT 356  --    BMET  Recent Labs  04/26/14 2314 04/26/14 2322  NA 138 141  K 4.2 4.2  CL 111 109  CO2 20  --   GLUCOSE 120* 117*  BUN 15 18  CREATININE 0.89 0.80  CALCIUM 7.4*  --    LFT  Recent Labs  04/26/14 2314  PROT 4.5*  ALBUMIN 2.4*  AST 17  ALT 11  ALKPHOS 66  BILITOT 0.1*   PT/INR  Recent Labs  04/26/14 2314  LABPROT 14.9  INR 1.16     Studies/Results: No results found.  Impression/Plan: 33 yo with upper GI bleed (hematemesis) concerning for a peptic ulcer bleed. EGD now. Continue Protonix infusion. NPO. Supportive care.    LOS: 0 days   Denni France C.  04/27/2014, 8:06 AM   

## 2014-04-28 DIAGNOSIS — J329 Chronic sinusitis, unspecified: Secondary | ICD-10-CM

## 2014-04-28 DIAGNOSIS — G894 Chronic pain syndrome: Secondary | ICD-10-CM

## 2014-04-28 LAB — CBC
HCT: 25.8 % — ABNORMAL LOW (ref 36.0–46.0)
HCT: 28 % — ABNORMAL LOW (ref 36.0–46.0)
Hemoglobin: 8.6 g/dL — ABNORMAL LOW (ref 12.0–15.0)
Hemoglobin: 9.2 g/dL — ABNORMAL LOW (ref 12.0–15.0)
MCH: 27.7 pg (ref 26.0–34.0)
MCH: 27.1 pg (ref 26.0–34.0)
MCHC: 33.3 g/dL (ref 30.0–36.0)
MCHC: 32.9 g/dL (ref 30.0–36.0)
MCV: 83.2 fL (ref 78.0–100.0)
MCV: 82.6 fL (ref 78.0–100.0)
Platelets: 250 10*3/uL (ref 150–400)
Platelets: 290 10*3/uL (ref 150–400)
RBC: 3.1 MIL/uL — ABNORMAL LOW (ref 3.87–5.11)
RBC: 3.39 MIL/uL — ABNORMAL LOW (ref 3.87–5.11)
RDW: 15.7 % — ABNORMAL HIGH (ref 11.5–15.5)
RDW: 15.6 % — ABNORMAL HIGH (ref 11.5–15.5)
WBC: 11.5 10*3/uL — ABNORMAL HIGH (ref 4.0–10.5)
WBC: 13.6 10*3/uL — ABNORMAL HIGH (ref 4.0–10.5)

## 2014-04-28 LAB — COMPREHENSIVE METABOLIC PANEL
ALT: 9 U/L (ref 0–35)
AST: 11 U/L (ref 0–37)
Albumin: 2.1 g/dL — ABNORMAL LOW (ref 3.5–5.2)
Alkaline Phosphatase: 52 U/L (ref 39–117)
Anion gap: 6 (ref 5–15)
BUN: 15 mg/dL (ref 6–23)
CO2: 19 mmol/L (ref 19–32)
Calcium: 7.6 mg/dL — ABNORMAL LOW (ref 8.4–10.5)
Chloride: 115 mmol/L — ABNORMAL HIGH (ref 96–112)
Creatinine, Ser: 0.56 mg/dL (ref 0.50–1.10)
GFR calc Af Amer: >90 mL/min (ref >90)
GFR calc non Af Amer: >90 mL/min (ref >90)
Glucose, Bld: 82 mg/dL (ref 70–99)
Potassium: 3.3 mmol/L — ABNORMAL LOW (ref 3.5–5.1)
Sodium: 140 mmol/L (ref 135–145)
Total Bilirubin: <0.1 mg/dL — ABNORMAL LOW (ref 0.3–1.2)
Total Protein: 4.1 g/dL — ABNORMAL LOW (ref 6.0–8.3)

## 2014-04-28 MED ORDER — FLUTICASONE PROPIONATE 50 MCG/ACT NA SUSP
2.0000 | Freq: Every day | NASAL | Status: DC
Start: 2014-04-28 — End: 2014-05-14
  Administered 2014-04-28 – 2014-05-13 (×13): 2 via NASAL
  Filled 2014-04-28 (×2): qty 16

## 2014-04-28 MED ORDER — PROMETHAZINE HCL 25 MG/ML IJ SOLN
12.5000 mg | Freq: Four times a day (QID) | INTRAMUSCULAR | Status: DC | PRN
  Administered 2014-04-28 – 2014-05-07 (×23): 12.5 mg via INTRAVENOUS
  Filled 2014-04-28 (×23): qty 1

## 2014-04-28 MED ORDER — SALINE SPRAY 0.65 % NA SOLN
1.0000 | Freq: Two times a day (BID) | NASAL | Status: DC
  Administered 2014-04-28 – 2014-05-14 (×21): 1 via NASAL
  Filled 2014-04-28 (×3): qty 44

## 2014-04-28 MED ORDER — HYDROMORPHONE HCL 1 MG/ML IJ SOLN
1.0000 mg | INTRAMUSCULAR | Status: DC | PRN
  Administered 2014-04-28 – 2014-04-29 (×7): 2 mg via INTRAVENOUS
  Administered 2014-04-29: 1 mg via INTRAVENOUS
  Administered 2014-04-29: 2 mg via INTRAVENOUS
  Filled 2014-04-28: qty 2
  Filled 2014-04-28: qty 1
  Filled 2014-04-28 (×7): qty 2

## 2014-04-28 MED ORDER — POTASSIUM CHLORIDE 10 MEQ/100ML IV SOLN
10.0000 meq | INTRAVENOUS | Status: AC
  Administered 2014-04-28 (×4): 10 meq via INTRAVENOUS
  Filled 2014-04-28 (×3): qty 100

## 2014-04-28 NOTE — Progress Notes (Signed)
Pt is getting out to bed without calling for help. Pt is reminded to call for help before attempting to get out of bed

## 2014-04-28 NOTE — Progress Notes (Signed)
Patient has c/o back pain and headache during my shift. Neurologically unremarkable, MAE and able to get OOB without difficulty.  Steady gait.  Face symmetrical.  No abnormal bruising or swelling.  No noticed lump or bruising on back of head.

## 2014-04-28 NOTE — Progress Notes (Signed)
PULMONARY / CRITICAL CARE MEDICINE   Name: Kimberly Weiss MRN: [id number] DOB: December 16, 1980    ADMISSION DATE:  04/26/2014  PRIMARY SERVICE: PCCM  CHIEF COMPLAINT:  Syncope, hematemesis  BRIEF PATIENT DESCRIPTION: 34 y/o woman admitted 2/26 after syncopal episode.  Work up concerning for UGI bleed likely due to NSAID usage. PCCM consulted for ICU admission.   SIGNIFICANT EVENTS / STUDIES:  2/26  Admit with syncope prior to presentation, concern for UGIB.  Hgb 8.2 from 15 2/28  No further bleeding, pt seeking pain meds, getting out of bed without assistance   SUBJECTIVE: Pt repetitively asking for food/drink.  RN reports pt getting out of bed without calling, no further bleeding.  Pt c/o poor pain control despite dilaudid / fentanyl.  Notes sinus headaches but only takes pain meds for such.   "I want my disability, that will make me feel better"   VITAL SIGNS: Temp:  [97.8 F (36.6 C)-98.3 F (36.8 C)] 98.1 F (36.7 C) (02/28 0726) Pulse Rate:  [94-116] 98 (02/28 0800) Resp:  [10-25] 18 (02/28 0800) BP: (93-120)/(60-89) 100/62 mmHg (02/28 0800) SpO2:  [96 %-100 %] 98 % (02/28 0800)   INTAKE / OUTPUT: Intake/Output      02/27 0701 - 02/28 0700 02/28 0701 - 02/29 0700   I.V. (mL/kg) 1671.3 (22.4) 100 (1.3)   Blood     Other     Total Intake(mL/kg) 1671.3 (22.4) 100 (1.3)   Urine (mL/kg/hr) 575 (0.3)    Total Output 575     Net +1096.3 +100        Urine Occurrence 3 x 1 x     PHYSICAL EXAMINATION: General:  Pale appearing F, anxious / irritated Neuro:  CN grossly intact, mentating well. HEENT:  MM pink/moist, good dentition  Neck: No JVD Cardiovascular:  s1s2 rrr, tachy, no m/r/g Lungs:  CTAB Abdomen:  Soft, mildly tender to palpation, hyperactive bowel sounds Musculoskeletal:  No deformities Skin:  No rashes  LABS:  CBC  Recent Labs Lab 04/27/14 1140 04/27/14 1836 04/27/14 2147  WBC 11.9* 12.6* 11.3*  HGB 8.6* 8.9* 9.2*  HCT 26.0* 26.3* 28.2*  PLT 241  334 247   Coag's  Recent Labs Lab 04/26/14 2314  INR 1.16   BMET  Recent Labs Lab 04/26/14 2314 04/26/14 2322 04/27/14 1140 04/28/14 0321  NA 138 141 143 140  K 4.2 4.2 3.9 3.3*  CL 111 109 119* 115*  CO2 20  --  19 19  BUN CREATININE 0.89 0.80 0.61 0.56  GLUCOSE 120* 117* 81 82   Electrolytes  Recent Labs Lab 04/26/14 2314 04/27/14 1140 04/28/14 0321  CALCIUM 7.4* 7.2* 7.6*  MG  --  1.6  --   PHOS  --  2.0*  --    Sepsis Markers  Recent Labs Lab 04/26/14 2322  LATICACIDVEN 2.57*   Liver Enzymes  Recent Labs Lab 04/26/14 2314 04/28/14 0321  AST 17 11  ALT 11 9  ALKPHOS 66 52  BILITOT 0.1* <0.1*  ALBUMIN 2.4* 2.1*   Glucose  Recent Labs Lab 04/27/14 0126  GLUCAP 84    Imaging No results found.   ASSESSMENT / PLAN:  PULMONARY A: Chronic Tobacco Abuse P:   Smoking cessation counseling  PRN albuterol for wheezing  Pulmonary hygiene Nasal hygiene - flonase + saline spray   CARDIOVASCULAR A: Hypovolemic shock - resolved.  P:   NS @ 50 ml/hr   RENAL A: Lactic Acidosis - resolving  P:   Gentle hydration, NS @ 50 ml/hr Follow BMP Replace electrolytes as indicated   GASTROINTESTINAL A: Major Upper GI Bleed - likely in setting of NSAID / ETOH usage.  Massive cratered duodenal bulb ulcer noted on EGD P:   Protonix gtt GI Following, appreciate assistance If re-bleed, IR would embolize and if not successful would require surgery NPO Defer restart of intake to GI  HEMATOLOGIC A: Anemia - due to hemorrhage. P:   S/P 2 units PRBC CBC Q12 x2 occurences  Tx for Hgb <7% or further active bleeding   INFECTIOUS A: No active issues P:   Monitor fever curve / WBC  ENDOCRINE A: Hypothyroidism P:   Synthroid 25 mcg IV, transition back to PO once cleared for intake   NEUROLOGIC A: Anxiety BZD dependence Opiate dependence P:   Hold home meds: elavil, klonopin, percocet 10, effexor PRN IV ativan for  anxiety / evidence of withdrawal PRN dilaudid for pain    Will wait to transfer out of ICU until seen by GI.    Canary BrimBrandi Ollis, NP-C [facility] Pgr: [phone] or [phone]  Reviewed above, examined.  She c/o pain everywhere, but especially in her sinus.  Dilaudid helps but doesn't last.  abd soft, chest clear.  Hb stable.  Will adjust dilaudid regimen, sinus regimen.  Continue to monitor in ICU for today.  Coralyn HellingVineet Dewanda Fennema, MD [facility] 04/28/2014, 11:44 AM Pager:  [phone] After 3pm call: [phone]

## 2014-04-28 NOTE — Progress Notes (Addendum)
Patient ID: Kimberly Weiss, female   DOB: 05/25/1980, 34 y.o.   MRN: [id number] [facility]  Kimberly Weiss 34 y.o. 07/03/1980   Subjective: Denies abdominal pain/N/V. No bleeding overnight. Complaining of back pain.  Objective: Vital signs in last 24 hours: Filed Vitals:   04/28/14 0900  BP: 106/69  Pulse: 108  Temp: 98.1  Resp: 19    Physical Exam: Gen: lethargic, no acute distress Abd: epigastric tenderness with guarding, soft, nondistended, +BS  Lab Results:  Recent Labs  04/27/14 1140 04/28/14 0321  NA 143 140  K 3.9 3.3*  CL 119* 115*  CO2 19 19  GLUCOSE 81 82  BUN 22 15  CREATININE 0.61 0.56  CALCIUM 7.2* 7.6*  MG 1.6  --   PHOS 2.0*  --     Recent Labs  04/26/14 2314 04/28/14 0321  AST 17 11  ALT 11 9  ALKPHOS 66 52  BILITOT 0.1* <0.1*  PROT 4.5* 4.1*  ALBUMIN 2.4* 2.1*    Recent Labs  04/27/14 1836 04/27/14 2147  WBC 12.6* 11.3*  HGB 8.9* 9.2*  HCT 26.3* 28.2*  MCV 81.9 84.2  PLT 334 247    Recent Labs  04/26/14 2314  LABPROT 14.9  INR 1.16      Assessment/Plan: Large Duodenal Ulcer bleed (likely due to NSAIDs) - no bleeding overnight; Hgb yesterday 9.2 (stable); High risk for rebleeding and if rebleeding occurs then next step would be embolization and if not successful surgery. She has poor insight into the implications and potential risk of her ulcer. Needs to avoid NSAIDs indefinitely. Continue Protonix infusion. Keep in ICU. Sips of water and ice chips ok today but would not advance beyond that. Would avoid all PO meds for now. Follow H/Hs. Check H. Pylori serology.   Tremel Setters C. 04/28/2014, 11:23 AM

## 2014-04-29 ENCOUNTER — Encounter (HOSPITAL_COMMUNITY): Payer: Self-pay | Admitting: Gastroenterology

## 2014-04-29 ENCOUNTER — Inpatient Hospital Stay (HOSPITAL_COMMUNITY): Payer: Medicaid Other

## 2014-04-29 DIAGNOSIS — R000 Tachycardia, unspecified: Secondary | ICD-10-CM | POA: Diagnosis present

## 2014-04-29 DIAGNOSIS — S0990XA Unspecified injury of head, initial encounter: Secondary | ICD-10-CM | POA: Diagnosis present

## 2014-04-29 LAB — CBC
HCT: 25.1 % — ABNORMAL LOW (ref 36.0–46.0)
Hemoglobin: 8.2 g/dL — ABNORMAL LOW (ref 12.0–15.0)
MCH: 27.9 pg (ref 26.0–34.0)
MCHC: 32.7 g/dL (ref 30.0–36.0)
MCV: 85.4 fL (ref 78.0–100.0)
Platelets: 244 10*3/uL (ref 150–400)
RBC: 2.94 MIL/uL — ABNORMAL LOW (ref 3.87–5.11)
RDW: 15.4 % (ref 11.5–15.5)
WBC: 8.2 10*3/uL (ref 4.0–10.5)

## 2014-04-29 LAB — BASIC METABOLIC PANEL
Anion gap: 3 — ABNORMAL LOW (ref 5–15)
BUN: 6 mg/dL (ref 6–23)
CO2: 21 mmol/L (ref 19–32)
Calcium: 7.6 mg/dL — ABNORMAL LOW (ref 8.4–10.5)
Chloride: 111 mmol/L (ref 96–112)
Creatinine, Ser: 0.51 mg/dL (ref 0.50–1.10)
GFR calc Af Amer: >90 mL/min (ref >90)
GFR calc non Af Amer: >90 mL/min (ref >90)
Glucose, Bld: 69 mg/dL — ABNORMAL LOW (ref 70–99)
Potassium: 3.6 mmol/L (ref 3.5–5.1)
Sodium: 135 mmol/L (ref 135–145)

## 2014-04-29 LAB — H. PYLORI ANTIBODY, IGG: H Pylori IgG: <0.9 U/mL (ref 0.0–0.8)

## 2014-04-29 MED ORDER — AMITRIPTYLINE HCL 50 MG PO TABS
50.0000 mg | ORAL_TABLET | Freq: Every day | ORAL | Status: DC
  Administered 2014-04-30 – 2014-05-04 (×6): 50 mg via ORAL
  Filled 2014-04-29 (×8): qty 1

## 2014-04-29 MED ORDER — OXYCODONE HCL 5 MG PO TABS
15.0000 mg | ORAL_TABLET | Freq: Three times a day (TID) | ORAL | Status: DC | PRN
  Administered 2014-04-29 – 2014-05-01 (×5): 15 mg via ORAL
  Filled 2014-04-29 (×5): qty 3

## 2014-04-29 MED ORDER — BOOST / RESOURCE BREEZE PO LIQD
1.0000 | Freq: Two times a day (BID) | ORAL | Status: DC
  Administered 2014-04-30 – 2014-05-04 (×7): 1 via ORAL

## 2014-04-29 MED ORDER — ACETAMINOPHEN 160 MG/5ML PO SOLN
650.0000 mg | Freq: Four times a day (QID) | ORAL | Status: DC | PRN
  Administered 2014-04-29 – 2014-05-01 (×3): 650 mg
  Filled 2014-04-29 (×4): qty 20.3

## 2014-04-29 MED ORDER — OXYCODONE-ACETAMINOPHEN 10-325 MG PO TABS: 1.0000 | ORAL_TABLET | Freq: Four times a day (QID) | ORAL | Status: DC | PRN

## 2014-04-29 MED ORDER — OXYCODONE-ACETAMINOPHEN 5-325 MG PO TABS: 1.0000 | ORAL_TABLET | Freq: Four times a day (QID) | ORAL | Status: DC | PRN

## 2014-04-29 MED ORDER — OXYCODONE HCL 5 MG PO TABS: 5.0000 mg | ORAL_TABLET | Freq: Four times a day (QID) | ORAL | Status: DC | PRN

## 2014-04-29 MED ORDER — CLONAZEPAM 0.5 MG PO TABS
0.5000 mg | ORAL_TABLET | Freq: Two times a day (BID) | ORAL | Status: DC
  Administered 2014-04-29 – 2014-05-05 (×13): 0.5 mg via ORAL
  Filled 2014-04-29 (×14): qty 1

## 2014-04-29 MED ORDER — LEVOTHYROXINE SODIUM 50 MCG PO TABS
50.0000 ug | ORAL_TABLET | Freq: Every day | ORAL | Status: DC
  Administered 2014-04-30 – 2014-05-05 (×5): 50 ug via ORAL
  Filled 2014-04-29 (×8): qty 1

## 2014-04-29 MED ORDER — VENLAFAXINE HCL ER 150 MG PO CP24
150.0000 mg | ORAL_CAPSULE | Freq: Every day | ORAL | Status: DC
  Administered 2014-04-30 – 2014-05-05 (×5): 150 mg via ORAL
  Filled 2014-04-29 (×8): qty 1

## 2014-04-29 NOTE — Progress Notes (Signed)
Received pt to 2c @ 15:00. Pt spoke of her complicated pain hx stating "I have a broken back". Fall risk updated to "1319" r/t recent fall. Pt went to CT @ 15:30. Will monitor pain, safety and educate on P.O.C.

## 2014-04-29 NOTE — Progress Notes (Signed)
Spoke with CT about scheduling to get the patient in. Was informed that several higher priority patients (code stroke, etc) were ahead of the patient and that they would call back to schedule a time to come

## 2014-04-29 NOTE — Progress Notes (Signed)
Patient ID: Kimberly Weiss, female   DOB: 02/09/1981, 34 y.o.   MRN: [id number] [facility]  Kimberly Weiss 34 y.o. 08/10/1980   Subjective: Sitting up in bed using cellphone. Complaining of a headache and backache. Denies abdominal pain.  Objective: Vital signs in last 24 hours: Filed Vitals:   04/29/14 0800  BP: 107/72  Pulse: 98  Temp: 97.9  Resp: 18    Physical Exam: Gen: lethargic, no acute distress Abd: minimal right-sided tenderness without guarding, otherwise nontender, soft, nondistended, +BS  Lab Results:  Recent Labs  04/27/14 1140 04/28/14 0321  NA 143 140  K 3.9 3.3*  CL 119* 115*  CO2 19 19  GLUCOSE 81 82  BUN 22 15  CREATININE 0.61 0.56  CALCIUM 7.2* 7.6*  MG 1.6  --   PHOS 2.0*  --     Recent Labs  04/26/14 2314 04/28/14 0321  AST 17 11  ALT 11 9  ALKPHOS 66 52  BILITOT 0.1* <0.1*  PROT 4.5* 4.1*  ALBUMIN 2.4* 2.1*    Recent Labs  04/28/14 1310 04/28/14 1813  WBC 11.5* 13.6*  HGB 8.6* 9.2*  HCT 25.8* 28.0*  MCV 83.2 82.6  PLT 250 290    Recent Labs  04/26/14 2314  LABPROT 14.9  INR 1.16      Assessment/Plan: S/P Duodenal ulcer bleed - no further bleeding; Hgb stable; needs to remain on Protonix infusion for 5 total days and then if stable change to IV PPI Q 12 hours. H. pylori serology pending. Will change diet to full liquids but would not advance further right now. Ok to go to step-down. Defer headaches to primary team.   Shaelee Forni C. 04/29/2014, 9:21 AM

## 2014-04-29 NOTE — Progress Notes (Addendum)
PULMONARY / CRITICAL CARE MEDICINE   Name: Kimberly Weiss MRN: [id number] DOB: 04-26-1980    ADMISSION DATE:  04/26/2014  PRIMARY SERVICE: PCCM  CHIEF COMPLAINT:  Syncope, hematemesis  BRIEF PATIENT DESCRIPTION: 34 y/o woman admitted 2/26 after syncopal episode.  Work up concerning for UGI bleed likely due to NSAID usage. PCCM consulted for ICU admission.   SIGNIFICANT EVENTS / STUDIES:  2/26  Admit with syncope prior to presentation, concern for UGIB.  Hgb 8.2 from 15 2/28  No further bleeding, pt seeking pain meds, getting out of bed without assistance   SUBJECTIVE: Feels "funny" this morning. Denies CP, SOB, dizziness, palpitations. Asking for pain medications for her sinuses and HAs.    VITAL SIGNS: Temp:  [97.1 F (36.2 C)-98.5 F (36.9 C)] 98.1 F (36.7 C) (02/29 0413) Pulse Rate:  [86-111] 86 (02/29 0600) Resp:  [10-29] 15 (02/29 0600) BP: (100-117)/(57-95) 101/57 mmHg (02/29 0600) SpO2:  [97 %-100 %] 97 % (02/29 0600) Weight:  [161 lb 9.6 oz (73.3 kg)] 161 lb 9.6 oz (73.3 kg) (02/29 0500)   INTAKE / OUTPUT: Intake/Output      02/28 0701 - 02/29 0700   I.V. (mL/kg) 1300 (17.7)   IV Piggyback 400   Total Intake(mL/kg) 1700 (23.2)   Net +1700       Urine Occurrence 15 x     PHYSICAL EXAMINATION: General:  F, NAD, resting in bed Neuro:  CN grossly intact, mentating well. HEENT:  MM pink/moist, good dentition  Neck: No JVD Cardiovascular:  s1s2 rrr no m/r/g Lungs:  CTAB Abdomen:  Soft,NTND, hyperactive bowel sounds Musculoskeletal:  No deformities Skin:  No rashes  LABS:  CBC  Recent Labs Lab 04/27/14 2147 04/28/14 1310 04/28/14 1813  WBC 11.3* 11.5* 13.6*  HGB 9.2* 8.6* 9.2*  HCT 28.2* 25.8* 28.0*  PLT 247 250 290   Coag's  Recent Labs Lab 04/26/14 2314  INR 1.16   BMET  Recent Labs Lab 04/26/14 2314 04/26/14 2322 04/27/14 1140 04/28/14 0321  NA 138 141 143 140  K 4.2 4.2 3.9 3.3*  CL 111 109 119* 115*  CO2 20  --  19 19  BUN  CREATININE 0.89 0.80 0.61 0.56  GLUCOSE 120* 117* 81 82   Electrolytes  Recent Labs Lab 04/26/14 2314 04/27/14 1140 04/28/14 0321  CALCIUM 7.4* 7.2* 7.6*  MG  --  1.6  --   PHOS  --  2.0*  --    Sepsis Markers  Recent Labs Lab 04/26/14 2322  LATICACIDVEN 2.57*   Liver Enzymes  Recent Labs Lab 04/26/14 2314 04/28/14 0321  AST 17 11  ALT 11 9  ALKPHOS 66 52  BILITOT 0.1* <0.1*  ALBUMIN 2.4* 2.1*   Glucose  Recent Labs Lab 04/27/14 0126  GLUCAP 84    Imaging No results found.   ASSESSMENT / PLAN:  PULMONARY A: Chronic Tobacco Abuse P:   Smoking cessation counseling  PRN albuterol for wheezing  Pulmonary hygiene Nasal hygiene - flonase + saline spray   CARDIOVASCULAR A: Hypovolemic shock - resolved.  P:   NS @ 75 ml/hr  - kvo Use products when appropriate to resuscitate Tele to remain, monitor for tachy, pulse pressure  RENAL A: Lactic Acidosis - resolving Chronic cysttis P:   Gentle hydration - dc Follow BMP in am  Replace electrolytes as indicated  kvo Will discuss use pyridium  GASTROINTESTINAL A: Major Upper GI Bleed - likely in setting of NSAID /  ETOH usage.  Massive cratered duodenal bulb ulcer noted on EGD P:   Protonix gtt GI Following, appreciate assistance If re-bleed, IR would embolize and if not successful would require surgery NPO Defer restart of intake to GI  HEMATOLOGIC A: Anemia - due to hemorrhage. P:   S/P 2 units PRBC CBC Qday Tx for Hgb <7% or further active bleeding  INFECTIOUS A: No active issues P:   Monitor fever curve / WBC  ENDOCRINE A: Hypothyroidism P:   Synthroid 25 mcg IV, transition back to PO once cleared for intake   NEUROLOGIC A: Anxiety BZD dependence Opiate dependence P:   Restart elavil, klonopin, percocet 10, effexor PRN IV ativan for anxiety / evidence of withdrawal PRN dilaudid for pain   Jamal CollinJames R Joyner, MD 04/29/2014, 6:38 AM PGY-2, [facility]  STAFF NOTE: I, Rory Percyaniel Feinstein, MD FACP have personally reviewed patient's available data, including medical history, events of note, physical examination and test results as part of my evaluation. I have discussed with resident/NP and other care providers such as pharmacist, RN and RRT. In addition, I personally evaluated patient and elicited key findings of: No evidence bleeding, to sdu x 24 hr, if no bed in am to floor tele, ppi , kvo, cbc to am , chem in am, her neck has no pain on palpation, may have muscle injurt , but heaqdache continues, will CT head, neck now  Mcarthur Rossettianiel J. Tyson AliasFeinstein, MD, FACP Pgr: [phone] [facility] 04/29/2014 11:14 AM

## 2014-04-29 NOTE — Progress Notes (Signed)
Chaplain responded to consult.  Pt alone in room, lights off, no tv on and phone just laying on the bed.  Pt does not make eye contact, will glance from time to time but no constant eye contact.  Pt stares down most of the time.  Pt shared about strained relationship with siblings and mother's death.  Pt also shared she had been "married to Weiss monster."  Pt discussed faith, states she is an "unaffiliated child of God" and has become disillusioned by the "hypocrisy" she has witnessed in the church.   At conclusion of visit pt asked for help working on social security and disability application, thus Weiss CSW consult would be helpful.  Chaplain provided emotional and spiritual support as well as presence, prayer and empathetic listening.  Chaplain will follow up if/as needed.    04/29/14 0800  Clinical Encounter Type  Visited With Patient  Visit Type Initial;Psychological support;Spiritual support;Social support;Critical Care  Referral From Nurse  Consult/Referral To Social work  Spiritual Encounters  Spiritual Needs Prayer;Emotional;Grief support  Stress Factors  Patient Stress Factors Exhausted;Family relationships;Financial concerns;Health changes;Lack of knowledge;Loss   Kimberly Weiss, Kimberly Weiss, Chaplain 04/29/2014 8:44 AM

## 2014-04-29 NOTE — Progress Notes (Signed)
INITIAL NUTRITION ASSESSMENT  DOCUMENTATION CODES Per approved criteria  -Not Applicable   INTERVENTION: Provide Resource Breeze BID, each provides 250 kcal, 9 grams of protein, and 197 ml of H20  NUTRITION DIAGNOSIS: Inadequate oral intake related to GI bleeding as evidenced by decreased PO intake.   Goal: Pt to meet >/= 90% of estimated energy needs  Monitor:  Diet advancement, PO intake, weight, labs, I/O's  Reason for Assessment: Pt identified due to Malnutrition Screening Tool  34 y.o. female  Admitting Dx: Syncope  ASSESSMENT: 34 y/o female admitted 2/26 after syncopal episode. Presented with major upper GI bleed likely in setting of NSAID/ETOH usage. Massive cratered duodenal bulb ulcer noted on EGD.   Labs- low K, Ca; high Cl Pt was eating breakfast upon assessment. She reported some nausea, but started to feel hungry last night. Pt started to feel nausea on 2/27, but denied any appetite changes pta. She reported some wt loss; however, wt hx shows stable weight. Will continue to monitor PO intake, I/O's. NFPE did not indicate any muscle mass or subcutaneous body fat depletion.  Discussed [employer] Breeze with patient, and she was interested in trying in between meals.  Height: Ht Readings from Last 1 Encounters:  04/27/14 5\' 3"  (1.6 m)    Weight: Wt Readings from Last 1 Encounters:  04/29/14 161 lb 9.6 oz (73.3 kg)    Ideal Body Weight: 115 lb (52.3 kg)  % Ideal Body Weight: 140%  Wt Readings from Last 10 Encounters:  04/29/14 161 lb 9.6 oz (73.3 kg)  02/04/14 151 lb (68.493 kg)  01/04/14 151 lb (68.493 kg)  08/30/13 153 lb (69.4 kg)  07/19/13 155 lb (70.308 kg)  01/22/13 159 lb 4 oz (72.235 kg)  11/16/12 170 lb (77.111 kg)  11/02/12 164 lb (74.39 kg)  06/08/12 160 lb 5 oz (72.717 kg)    Usual Body Weight: 151 lb (68.6 kg)  % Usual Body Weight: 106%  BMI:  Body mass index is 28.63 kg/(m^2). overweight  Estimated Nutritional Needs: Kcal:  1500-1700 Protein: 70-85 grams Fluid: >/= 1.6L daily   Skin: intact  Diet Order: Diet full liquid  EDUCATION NEEDS: -No education needs identified at this time   Intake/Output Summary (Last 24 hours) at 04/29/14 1055 Last data filed at 04/29/14 1000  Gross per 24 hour  Intake   2000 ml  Output      0 ml  Net   2000 ml    Last BM: 2/27  Labs:   Recent Labs Lab 04/27/14 1140 04/28/14 0321 04/29/14 0856  NA 143 140 135  K 3.9 3.3* 3.6  CL 119* 115* 111  CO2 19 19 21   BUN 22 15 6   CREATININE 0.61 0.56 0.51  CALCIUM 7.2* 7.6* 7.6*  MG 1.6  --   --   PHOS 2.0*  --   --   GLUCOSE 81 82 69*    CBG (last 3)   Recent Labs  04/27/14 0126  GLUCAP 84    Scheduled Meds: . fluticasone  2 spray Each Nare Daily  . levothyroxine  25 mcg Intravenous Daily  . sodium chloride  1 spray Each Nare BID    Continuous Infusions: . sodium chloride 75 mL/hr at 04/29/14 0752  . pantoprozole (PROTONIX) infusion 8 mg/hr (04/29/14 0221)    Past Medical History  Diagnosis Date  . Renal disorder   . Interstitial cystitis   . Thyroid disease   . Hypothyroidism   . Renal stones   .  Back pain     Past Surgical History  Procedure Laterality Date  . Esophagogastroduodenoscopy N/A 04/27/2014    Procedure: ESOPHAGOGASTRODUODENOSCOPY (EGD);  Surgeon: Shirley Friar, MD;  Location: [facility] ENDOSCOPY;  Service: Endoscopy;  Laterality: N/A;    Cristela Felt, MS Dietetic Intern Pager: [phone]

## 2014-04-30 DIAGNOSIS — Z8719 Personal history of other diseases of the digestive system: Secondary | ICD-10-CM

## 2014-04-30 DIAGNOSIS — I9589 Other hypotension: Secondary | ICD-10-CM | POA: Diagnosis present

## 2014-04-30 DIAGNOSIS — K269 Duodenal ulcer, unspecified as acute or chronic, without hemorrhage or perforation: Secondary | ICD-10-CM | POA: Diagnosis present

## 2014-04-30 DIAGNOSIS — Z9289 Personal history of other medical treatment: Secondary | ICD-10-CM

## 2014-04-30 DIAGNOSIS — Z720 Tobacco use: Secondary | ICD-10-CM

## 2014-04-30 DIAGNOSIS — E038 Other specified hypothyroidism: Secondary | ICD-10-CM | POA: Diagnosis present

## 2014-04-30 DIAGNOSIS — R571 Hypovolemic shock: Secondary | ICD-10-CM | POA: Diagnosis present

## 2014-04-30 DIAGNOSIS — F411 Generalized anxiety disorder: Secondary | ICD-10-CM | POA: Diagnosis present

## 2014-04-30 HISTORY — DX: Personal history of other medical treatment: Z92.89

## 2014-04-30 HISTORY — DX: Personal history of other diseases of the digestive system: Z87.19

## 2014-04-30 LAB — TYPE AND SCREEN
ABO/RH(D): O POS
Antibody Screen: NEGATIVE
Unit division: 0
Unit division: 0
Unit division: 0
Unit division: 0

## 2014-04-30 LAB — COMPREHENSIVE METABOLIC PANEL
ALT: 9 U/L (ref 0–35)
AST: 12 U/L (ref 0–37)
Albumin: 2.5 g/dL — ABNORMAL LOW (ref 3.5–5.2)
Alkaline Phosphatase: 58 U/L (ref 39–117)
Anion gap: 3 — ABNORMAL LOW (ref 5–15)
BUN: <5 mg/dL — ABNORMAL LOW (ref 6–23)
CO2: 27 mmol/L (ref 19–32)
Calcium: 8.3 mg/dL — ABNORMAL LOW (ref 8.4–10.5)
Chloride: 110 mmol/L (ref 96–112)
Creatinine, Ser: 0.57 mg/dL (ref 0.50–1.10)
GFR calc Af Amer: >90 mL/min (ref >90)
GFR calc non Af Amer: >90 mL/min (ref >90)
Glucose, Bld: 100 mg/dL — ABNORMAL HIGH (ref 70–99)
Potassium: 3.6 mmol/L (ref 3.5–5.1)
Sodium: 140 mmol/L (ref 135–145)
Total Bilirubin: 0.4 mg/dL (ref 0.3–1.2)
Total Protein: 5 g/dL — ABNORMAL LOW (ref 6.0–8.3)

## 2014-04-30 LAB — CBC
HCT: 22.8 % — ABNORMAL LOW (ref 36.0–46.0)
Hemoglobin: 7.4 g/dL — ABNORMAL LOW (ref 12.0–15.0)
MCH: 26.9 pg (ref 26.0–34.0)
MCHC: 32.5 g/dL (ref 30.0–36.0)
MCV: 82.9 fL (ref 78.0–100.0)
Platelets: 245 10*3/uL (ref 150–400)
RBC: 2.75 MIL/uL — ABNORMAL LOW (ref 3.87–5.11)
RDW: 15.1 % (ref 11.5–15.5)
WBC: 7.1 10*3/uL (ref 4.0–10.5)

## 2014-04-30 LAB — MAGNESIUM: Magnesium: 2 mg/dL (ref 1.5–2.5)

## 2014-04-30 LAB — PREPARE RBC (CROSSMATCH)

## 2014-04-30 MED ORDER — SODIUM CHLORIDE 0.9 % IV BOLUS (SEPSIS)
500.0000 mL | Freq: Once | INTRAVENOUS | Status: AC
  Administered 2014-04-30: 500 mL via INTRAVENOUS

## 2014-04-30 MED ORDER — SODIUM CHLORIDE 0.9 % IV SOLN
Freq: Once | INTRAVENOUS | Status: AC
  Administered 2014-04-30: 21:00:00 via INTRAVENOUS

## 2014-04-30 MED ORDER — SODIUM CHLORIDE 0.9 % IV BOLUS (SEPSIS)
500.0000 mL | Freq: Once | INTRAVENOUS | Status: AC
  Administered 2014-05-01: 500 mL via INTRAVENOUS

## 2014-04-30 NOTE — Progress Notes (Signed)
IV nurse consulted for an IV start. Nurse went into give pt some phenergan but held off as pt's IV to left arm was warm and red and hurt when nurse tried to flush. Nurse stopped protonix drip and saline locked. Waited for IV nurse to come. IV nurse stated she thought she was a candidate for a PICC. Pt's right arm and hand swollen a large amount and soar to touch upon examine this am from a previous IV removed on another shift. Now right hand slightly swollen and red. Gave patient an ice pack for left hand and stated to pt to elevate it. Will monitor.

## 2014-04-30 NOTE — Progress Notes (Signed)
[place] TEAM 1 - Stepdown/ICU TEAM Progress Note  SMITH MCNICHOLAS ZOX:[id number] DOB: 1980-08-23 DOA: 04/26/2014 PCP: Lilyan Punt, MD  Admit HPI / Brief Narrative: Ms. Hizer is a 34 y/o WF PMHx chronic back pain, hypothyroidism, and pelvic pain (interstitial cystitis) and chronic opiate dependence who presented to the ED after sycnope and large volume hematemesis. The patient reports that since increasing her opiates a few weeks ago, she has been more constipated, and has been taking TUMS and GasEx for bloating. When asked about laxitives and bowel regimen, she seemed to indicate a particular aversion to these. While before she had been having 1 BM daily, she reports going every 3-4 days and as long as a week over the last month or so. She also has been taking Excedrin (unclear strength) daily for the last month, and reports taking regularly for some years. She has never had GI bleed before. She reports she did have GERD while pregnant and was on a PPI temporarily. She felt a little nauseated on the day of admission, and then lightheaded, and then an episode of syncope, followed by hematemasis. She has had no melena. She was brought to the ED where she was hypotensive and tachycardic, and found to have a Hb of 8.5 from a baseline of 15.  HPI/Subjective: 3/1 A/O 4, NAD. States negative Hematoemesis/Melena overnight. Negative in/V, negative CP, negative SOB     Assessment/Plan: Acute blood loss anemia/upper GI bleed secondary Duodenal Ulcer (baseline hemoglobin~14-15) -2/27 S/P transfusion 2 units PRBC -Per GI continue Protonix infusion for 5 total days (stop date 3/3) and then if stable change to IV PPI Q 12 hours.  -H. pylori serology pending.  -Will change diet to full liquids but would not advance further right now. -Tx for Hgb <7% or further active bleeding  -3/1 Patient's hemoglobin has dropped one whole unit overnight, transfuse 1 unit PRBC  Hypovolemic shock - Resolved  Chronic  Tobacco Abuse -Smoking cessation counseling  -PRN albuterol for wheezing   Lactic Acidosis  - Resolved   Hypothyroidism -Synthroid 25 mcg IV, transition back to PO once cleared for intake   Anxiety -Continue clonazepam 0.5 mg BID  -Continue venlafaxine 150 mg daily      Code Status: FULL Family Communication: no family present at time of exam Disposition Plan: Per GI    Consultants: Dr.Vincent Jeannine Boga (GI) Dr.Vineet Sood [facility] M)    Procedure/Significant Events: 2/27 EGD;Massive cratered Duodenal Bulb Ulcer bleed  2/27 S/P transfusion 2 units PRBC 3/1 transfuse one unit PRBC   Culture NA  Antibiotics: NA  DVT prophylaxis: SCD   Devices NA   LINES / TUBES:  NA   Continuous Infusions: . pantoprozole (PROTONIX) infusion 8 mg/hr (04/30/14 0416)    Objective: VITAL SIGNS: Temp: 98 F (36.7 C) (03/01 0741) Temp Source: Oral (03/01 0741) BP: 95/52 mmHg (03/01 0741) Pulse Rate: 87 (03/01 0741) SPO2; FIO2:   Intake/Output Summary (Last 24 hours) at 04/30/14 1000 Last data filed at 04/30/14 0756  Gross per 24 hour  Intake   1365 ml  Output   2600 ml  Net  -1235 ml     Exam: General: A/O 4, became tearful when advised that she will have to stay for IV infusion Protonix and additional blood products, No acute respiratory distress Lungs: Clear to auscultation bilaterally without wheezes or crackles Cardiovascular: Regular rate and rhythm without murmur gallop or rub normal S1 and S2 Abdomen: Nontender, nondistended, soft, bowel sounds positive, no rebound, no ascites,  no appreciable mass Extremities: No significant cyanosis, clubbing, or edema bilateral lower extremities  Data Reviewed: Basic Metabolic Panel:  Recent Labs Lab 04/26/14 2314 04/26/14 2322 04/27/14 1140 04/28/14 0321 04/29/14 0856 04/30/14 0828  NA 138 141 143 140 135 140  K 4.2 4.2 3.9 3.3* 3.6 3.6  CL 111 109 119* 115* 111 110  CO2 20  --  19 19 21 27   GLUCOSE  120* 117* 81 82 69* 100*  BUN 15 18 22 15 6  <5*  CREATININE 0.89 0.80 0.61 0.56 0.51 0.57  CALCIUM 7.4*  --  7.2* 7.6* 7.6* 8.3*  MG  --   --  1.6  --   --  2.0  PHOS  --   --  2.0*  --   --   --    Liver Function Tests:  Recent Labs Lab 04/26/14 2314 04/28/14 0321 04/30/14 0828  AST 17 11 12   ALT 11 9 9   ALKPHOS 66 52 58  BILITOT 0.1* <0.1* 0.4  PROT 4.5* 4.1* 5.0*  ALBUMIN 2.4* 2.1* 2.5*    Recent Labs Lab 04/26/14 2314  LIPASE 28   No results for input(s): AMMONIA in the last 168 hours. CBC:  Recent Labs Lab 04/27/14 2147 04/28/14 1310 04/28/14 1813 04/29/14 0856 04/30/14 0327  WBC 11.3* 11.5* 13.6* 8.2 7.1  HGB 9.2* 8.6* 9.2* 8.2* 7.4*  HCT 28.2* 25.8* 28.0* 25.1* 22.8*  MCV 84.2 83.2 82.6 85.4 82.9  PLT 247 250 290 244 245   Cardiac Enzymes: No results for input(s): CKTOTAL, CKMB, CKMBINDEX, TROPONINI in the last 168 hours. BNP (last 3 results) No results for input(s): BNP in the last 8760 hours.  ProBNP (last 3 results) No results for input(s): PROBNP in the last 8760 hours.  CBG:  Recent Labs Lab 04/27/14 0126  GLUCAP 84    Recent Results (from the past 240 hour(s))  MRSA PCR Screening     Status: None   Collection Time: 04/27/14  1:28 AM  Result Value Ref Range Status   MRSA by PCR NEGATIVE NEGATIVE Final    Comment:        The GeneXpert MRSA Assay (FDA approved for NASAL specimens only), is one component of a comprehensive MRSA colonization surveillance program. It is not intended to diagnose MRSA infection nor to guide or monitor treatment for MRSA infections.      Studies:  Recent x-ray studies have been reviewed in detail by the Attending Physician  Scheduled Meds:  Scheduled Meds: . amitriptyline  50 mg Oral QHS  . clonazePAM  0.5 mg Oral BID  . feeding supplement (RESOURCE BREEZE)  1 Container Oral BID BM  . fluticasone  2 spray Each Nare Daily  . levothyroxine  50 mcg Oral QAC breakfast  . sodium chloride  1 spray  Each Nare BID  . venlafaxine XR  150 mg Oral Q breakfast    Time spent on care of this patient: 40 mins   Drema DallasWOODS, CURTIS, J , [facility]  [facility] Office  [phone] Pager - [phone]  On-Call/Text Page:      [email or web address]      password [email or web address]  If 7PM-7AM, please contact night-coverage [email or web address] Password [email or web address] 04/30/2014, 10:00 AM   LOS: 3 days   Care during the described time interval was provided by me .  I have reviewed this patient's available data, including medical history, events of note, physical examination, radiology studies and test results as part of my evaluation  Carolyne Littlesurtis Woods, MD [phone] Pager

## 2014-04-30 NOTE — Progress Notes (Signed)
Pt has had no bloody stools or vomit. Pt has had pain medication a couple of times today for chronic back pain from a fall she had in past at home, that she states rates 8-9/10. Pt stated after given pain medication that she had some relief from medication. Pt is alert and oriented x 4. Pt is to receive a unit of blood and MD placed order. Blood bank called and stated she needed to be type and screened as her last one expired. Order placed stat for lab to draw. Passing on to night shift. Pt's new IV that IV team placed is working well thus far. Will monitor it for swelling and inflammation as pt is at risk for IV infiltrating again. Pt has poor veins and is a hard stick as well. PICC line order placed. Pt's father would also like to speak with GI doctor tomorrow and daughter would like him to speak about her care. Sticky note placed for GI doctor to notify.

## 2014-04-30 NOTE — Progress Notes (Signed)
Patient ID: Kimberly Weiss, female   DOB: 01/23/1981, 34 y.o.   MRN: [id number] [facility]  Kimberly CookeyKatherine N Weiss 34 y.o. 12/24/1980   Subjective: Reports nausea with liquids today. Denies vomiting. Denies rectal bleeding. Tearful and crying in room. Nurse at bedside. Edema noted in both upper extremities  Objective: Vital signs in last 24 hours: Filed Vitals:   04/30/14 1413  BP: 117/71  Pulse: 98  Temp: 98.2 F (36.8 C)  Resp: 16    Physical Exam: Gen: lethargic, tearful Abd: epigastric tenderness with minimal guarding, soft, nondistended  Lab Results:  Recent Labs  04/29/14 0856 04/30/14 0828  NA 135 140  K 3.6 3.6  CL 111 110  CO2 21 27  GLUCOSE 69* 100*  BUN 6 <5*  CREATININE 0.51 0.57  CALCIUM 7.6* 8.3*  MG  --  2.0    Recent Labs  04/28/14 0321 04/30/14 0828  AST 11 12  ALT 9 9  ALKPHOS 52 58  BILITOT <0.1* 0.4  PROT 4.1* 5.0*  ALBUMIN 2.1* 2.5*    Recent Labs  04/29/14 0856 04/30/14 0327  WBC 8.2 7.1  HGB 8.2* 7.4*  HCT 25.1* 22.8*  MCV 85.4 82.9  PLT 244 245   No results for input(s): LABPROT, INR in the last 72 hours.    Assessment/Plan: S/P Duodenal Ulcer bleed - drop in Hgb (7.4 from 8.2) without any overt bleeding; If Hgb continues to fall, then will need repeat transfusion. Continue Protonix infusion per previous recs. If tolerates full liquids better today then ok to change to soft food tomorrow.   Kimberly Rylee C. 04/30/2014, 3:02 PM

## 2014-05-01 LAB — CBC
HCT: 22.1 % — ABNORMAL LOW (ref 36.0–46.0)
HCT: 20 % — ABNORMAL LOW (ref 36.0–46.0)
Hemoglobin: 7.4 g/dL — ABNORMAL LOW (ref 12.0–15.0)
Hemoglobin: 6.7 g/dL — CL (ref 12.0–15.0)
MCH: 27.9 pg (ref 26.0–34.0)
MCH: 27.5 pg (ref 26.0–34.0)
MCHC: 33.5 g/dL (ref 30.0–36.0)
MCHC: 33.5 g/dL (ref 30.0–36.0)
MCV: 83.4 fL (ref 78.0–100.0)
MCV: 82 fL (ref 78.0–100.0)
Platelets: 217 10*3/uL (ref 150–400)
Platelets: 250 10*3/uL (ref 150–400)
RBC: 2.65 MIL/uL — ABNORMAL LOW (ref 3.87–5.11)
RBC: 2.44 MIL/uL — ABNORMAL LOW (ref 3.87–5.11)
RDW: 15.1 % (ref 11.5–15.5)
RDW: 16 % — ABNORMAL HIGH (ref 11.5–15.5)
WBC: 8.9 10*3/uL (ref 4.0–10.5)
WBC: 11 10*3/uL — ABNORMAL HIGH (ref 4.0–10.5)

## 2014-05-01 LAB — PREPARE RBC (CROSSMATCH)

## 2014-05-01 MED ORDER — OXYCODONE HCL 5 MG PO TABS
10.0000 mg | ORAL_TABLET | Freq: Four times a day (QID) | ORAL | Status: DC | PRN
  Administered 2014-05-01 – 2014-05-05 (×13): 10 mg via ORAL
  Filled 2014-05-01 (×13): qty 2

## 2014-05-01 MED ORDER — SODIUM CHLORIDE 0.9 % IJ SOLN
10.0000 mL | Freq: Two times a day (BID) | INTRAMUSCULAR | Status: DC
  Administered 2014-05-01 – 2014-05-09 (×12): 10 mL

## 2014-05-01 MED ORDER — SODIUM CHLORIDE 0.9 % IJ SOLN
10.0000 mL | INTRAMUSCULAR | Status: DC | PRN
  Administered 2014-05-07: 10 mL
  Filled 2014-05-01: qty 40

## 2014-05-01 MED ORDER — MAGNESIUM CITRATE PO SOLN
1.0000 | Freq: Every day | ORAL | Status: DC | PRN
  Filled 2014-05-01 (×2): qty 296

## 2014-05-01 MED ORDER — PANTOPRAZOLE SODIUM 40 MG IV SOLR
40.0000 mg | Freq: Two times a day (BID) | INTRAVENOUS | Status: DC
  Filled 2014-05-01 (×2): qty 40

## 2014-05-01 MED ORDER — ACETAMINOPHEN 325 MG PO TABS
650.0000 mg | ORAL_TABLET | Freq: Four times a day (QID) | ORAL | Status: DC | PRN
  Administered 2014-05-01 – 2014-05-13 (×18): 650 mg via ORAL
  Filled 2014-05-01 (×19): qty 2

## 2014-05-01 MED ORDER — SODIUM CHLORIDE 0.9 % IV SOLN
INTRAVENOUS | Status: DC
  Administered 2014-05-01: 125 mL/h via INTRAVENOUS
  Administered 2014-05-01: via INTRAVENOUS
  Administered 2014-05-01 – 2014-05-02 (×2): 125 mL/h via INTRAVENOUS
  Administered 2014-05-02: 07:00:00 via INTRAVENOUS
  Administered 2014-05-03: 1000 mL via INTRAVENOUS
  Administered 2014-05-04: 03:00:00 via INTRAVENOUS
  Administered 2014-05-05 (×2): 200 mL/h via INTRAVENOUS
  Administered 2014-05-06 – 2014-05-08 (×5): via INTRAVENOUS

## 2014-05-01 MED ORDER — SODIUM CHLORIDE 0.9 % IV SOLN
Freq: Once | INTRAVENOUS | Status: AC
  Administered 2014-05-01: 10 mL/h via INTRAVENOUS

## 2014-05-01 MED ORDER — POLYETHYLENE GLYCOL 3350 17 G PO PACK
17.0000 g | PACK | Freq: Two times a day (BID) | ORAL | Status: DC
  Administered 2014-05-01 – 2014-05-04 (×6): 17 g via ORAL
  Filled 2014-05-01 (×8): qty 1

## 2014-05-01 MED ORDER — SENNOSIDES-DOCUSATE SODIUM 8.6-50 MG PO TABS
1.0000 | ORAL_TABLET | Freq: Two times a day (BID) | ORAL | Status: DC
  Administered 2014-05-01 – 2014-05-04 (×7): 1 via ORAL
  Filled 2014-05-01 (×8): qty 1

## 2014-05-01 NOTE — Progress Notes (Signed)
Patient ID: Kimberly CookeyKatherine N Weiss, female   DOB: 08/25/1980, 34 y.o.   MR[facility]: [id number] [facility]  Kimberly Weiss 34 y.o. 01/07/1981   Subjective: Small amount of hematemesis (bright red in clots) last night. None this morning. No BMs overnight. Family at bedside.  Objective: Vital signs in last 24 hours: Filed Vitals:   05/01/14 0929  BP: 102/50  Pulse: 123  Temp: 98.1 F (36.7 C)  Resp: 25    Physical Exam: Gen: lethargic, no acute distress Abd: diffusely tender with minimal guarding, soft, nondistended, +BS  Lab Results:  Recent Labs  04/29/14 0856 04/30/14 0828  NA 135 140  K 3.6 3.6  CL 111 110  CO2 21 27  GLUCOSE 69* 100*  BUN 6 <5*  CREATININE 0.51 0.57  CALCIUM 7.6* 8.3*  MG  --  2.0    Recent Labs  04/30/14 0828  AST 12  ALT 9  ALKPHOS 58  BILITOT 0.4  PROT 5.0*  ALBUMIN 2.5*    Recent Labs  04/30/14 0327 05/01/14 0230  WBC 7.1 8.9  HGB 7.4* 7.4*  HCT 22.8* 22.1*  MCV 82.9 83.4  PLT 245 217   No results for input(s): LABPROT, INR in the last 72 hours.    Assessment/Plan: S/P Duodenal Ulcer bleed (NSAID-induced ulcer; H. Pylori negative) - small amount of red blood with vomitus overnight with stable Hgb. Keep on full liquids. Protonix infusion. Follow H/H. If rebleeding occurs with drop in Hgb or hemodynamic instability then needs embolization.   Kimberly Weiss C. 05/01/2014, 10:48 AM

## 2014-05-01 NOTE — Progress Notes (Signed)
Text page to Dr Sharon SellerMcClung critical panel HGB 6.7

## 2014-05-01 NOTE — Progress Notes (Signed)
Peripherally Inserted Central Catheter/Midline Placement  The IV Nurse has discussed with the patient and/or persons authorized to consent for the patient, the purpose of this procedure and the potential benefits and risks involved with this procedure.  The benefits include less needle sticks, lab draws from the catheter and patient may be discharged home with the catheter.  Risks include, but not limited to, infection, bleeding, blood clot (thrombus formation), and puncture of an artery; nerve damage and irregular heat beat.  Alternatives to this procedure were also discussed.  PICC/Midline Placement Documentation        Vevelyn PatDuncan, Rolan Wrightsman Jean 05/01/2014, 9:16 AM

## 2014-05-01 NOTE — Progress Notes (Signed)
[place] TEAM 1 - Stepdown/ICU TEAM Progress Note  Kimberly Weiss ZOX:[id number] DOB: 01/28/1981 DOA: 04/26/2014 PCP: Lilyan PuntLUKING,SCOTT, MD  Admit HPI / Brief Narrative: 34 y/o F Hx chronic back pain, hypothyroidism, chronic pelvic pain (interstitial cystitis), and chronic opiate dependence who presented to the ED after syncope and a large volume hematemesis. The patient reported since increasing her opiates she had been more constipated. When asked about laxitives and bowel regimen, she seemed to indicate a particular aversion to these. She also had been taking Excedrin daily for a month. She felt nauseated on the day of admission, and then lightheaded, and then had an episode of syncope, followed by hematemasis. She had no melena. She was brought to the ED where she was hypotensive and tachycardic, and found to have a Hb of 8.5 from a baseline of 15.  HPI/Subjective: Pt c/o HA and desires resumption of her home narcotic medications.  She denies abdom pain.  She is very tearful, and quite anxious.  She denies cp, sob, or n/v.  She is thus far tolerating her current diet.  She does c/o constipation.      Assessment/Plan:  Acute blood loss anemia / UGIB due to Duodenal Ulcer -S/P total 3 units PRBC thus far -Per GI continue Protonix infusion for 5 total days (stop date 3/3) and then if stable change to IV PPI Q 12 hours  -H. pylori serology negative  -full liquid diet - would not advance further right now -Tx for Hgb <7 or further active bleeding - baseline Hgb ~14  Hypovolemic / hemorrhagic shock - hypotension  -shock resolved, but BP remains soft - cont to follow - hydrate/transfuse as needed   Lactic Acidosis  -due to hypovolemic shock - resolved   Chronic Tobacco Abuse -Smoking cessation counseling  -PRN albuterol for wheezing   Hypothyroidism -Synthroid 25 mcg IV, transition back to PO once cleared for intake   Anxiety -Continue clonazepam -Continue venlafaxine   Code  Status: FULL Family Communication: spoke w/ father at bedside at length  Disposition Plan: SDU until clear bleeding has stopped/Hgb is climbing   Consultants: Dr. Shirley FriarVincent C. Schooler (GI) Dr. Coralyn HellingVineet Sood (PCCM)  Procedure/Significant Events: 2/27 EGD massive cratered duodenal bulb ulcer bleed  2/27 transfusion 2 units PRBC 3/1 transfusion 1 unit PRBC   Antibiotics: NA  DVT prophylaxis: SCD  Objective: Blood pressure 102/50, pulse 123, temperature 98.1 F (36.7 C), temperature source Oral, resp. rate 25, height 5\' 3"  (1.6 m), weight 73.256 kg (161 lb 8 oz), last menstrual period 04/12/2014, SpO2 100 %.  Intake/Output Summary (Last 24 hours) at 05/01/14 1004 Last data filed at 05/01/14 0800  Gross per 24 hour  Intake   1345 ml  Output   3250 ml  Net  -1905 ml   Exam: General: No acute respiratory distress - anxious and tearful  Lungs: Clear to auscultation bilaterally without wheezes or crackles Cardiovascular: tachycardic but regular - no M, G, or rub  Abdomen: Nontender, nondistended, soft, bowel sounds positive, no rebound, no ascites, no appreciable mass Extremities: No significant cyanosis, clubbing, or edema bilateral lower extremities  Data Reviewed: Basic Metabolic Panel:  Recent Labs Lab 04/26/14 2314 04/26/14 2322 04/27/14 1140 04/28/14 0321 04/29/14 0856 04/30/14 0828  NA 138 141 143 140 135 140  K 4.2 4.2 3.9 3.3* 3.6 3.6  CL 111 109 119* 115* 111 110  CO2 20  --  19 19 21 27   GLUCOSE 120* 117* 81 82 69* 100*  BUN 15  <5*  CREATININE 0.89 0.80 0.61 0.56 0.51 0.57  CALCIUM 7.4*  --  7.2* 7.6* 7.6* 8.3*  MG  --   --  1.6  --   --  2.0  PHOS  --   --  2.0*  --   --   --    Liver Function Tests:  Recent Labs Lab 04/26/14 2314 04/28/14 0321 04/30/14 0828  AST ALT ALKPHOS 66 52 58  BILITOT 0.1* <0.1* 0.4  PROT 4.5* 4.1* 5.0*  ALBUMIN 2.4* 2.1* 2.5*    Recent Labs Lab 04/26/14 2314  LIPASE 28    CBC:  Recent Labs Lab 04/28/14 1310 04/28/14 1813 04/29/14 0856 04/30/14 0327 05/01/14 0230  WBC 11.5* 13.6* 8.2 7.1 8.9  HGB 8.6* 9.2* 8.2* 7.4* 7.4*  HCT 25.8* 28.0* 25.1* 22.8* 22.1*  MCV 83.2 82.6 85.4 82.9 83.4  PLT 250 290 244 245 217   CBG:  Recent Labs Lab 04/27/14 0126  GLUCAP 84    Recent Results (from the past 240 hour(s))  MRSA PCR Screening     Status: None   Collection Time: 04/27/14  1:28 AM  Result Value Ref Range Status   MRSA by PCR NEGATIVE NEGATIVE Final    Comment:        The GeneXpert MRSA Assay (FDA approved for NASAL specimens only), is one component of a comprehensive MRSA colonization surveillance program. It is not intended to diagnose MRSA infection nor to guide or monitor treatment for MRSA infections.      Studies:  Recent x-ray studies have been reviewed in detail by the Attending Physician  Scheduled Meds:  Scheduled Meds: . amitriptyline  50 mg Oral QHS  . clonazePAM  0.5 mg Oral BID  . feeding supplement (RESOURCE BREEZE)  1 Container Oral BID BM  . fluticasone  2 spray Each Nare Daily  . levothyroxine  50 mcg Oral QAC breakfast  . sodium chloride  1 spray Each Nare BID  . sodium chloride  10-40 mL Intracatheter Q12H  . venlafaxine XR  150 mg Oral Q breakfast    Time spent on care of this patient: 35 mins  Lonia Blood, MD [facility] For Consults/Admissions - Flow Manager - [phone] Office  [phone]  Contact MD directly via text page:      [email or web address]      password [facility]  05/01/2014, 10:04 AM   LOS: 4 days

## 2014-05-01 NOTE — Progress Notes (Signed)
Pt vomited 50cc of bright red emesis with small clots. Pt HR 140-180. BP 67/54. Rapid response called and Schorr, NP notified. EKG was performed showing sinus tachycardia. 1 unit RBC finished and 2 500cc bolus of normal saline given. Will continue to monitor.    Alba DestineMisty L Ennis, RN, BSN

## 2014-05-01 NOTE — Progress Notes (Addendum)
Hemoglobin 7.4. Schorr, NP notified X2   Alba DestineMisty L Ennis, RN, BSN

## 2014-05-01 NOTE — Significant Event (Signed)
Rapid Response Event Note  Overview: Time Called: 2223 Arrival Time: 2225 Event Type: Cardiac, Hypotension, Other (Comment)  Initial Focused Assessment: Called by Lanice SchwabMisty, RN 2C that pt had hematemesis, was nauseated and heart rate was 180.  ON arrival to room pt was sitting up in bed, alert, oriented, c/o abd pain.  One unit PRBC's infusing into pt's left FA   At 150 cc/hr  for HGB 7.4 .    NO other IV access available.  Pt on PICC line list. 75/52  147  12 100%RA.  BIlat BS clear.  Bilateral UE edema, Abd nondistended with active bowel sounds, tender on palpation with generalized abd pain 10/10. Pt had received Phenergan 12.5 mg per order at 2206 and Oxycodone IR 15 mg at 1827hrs.   Interventions: Placed pt flat in bed,  12lead EKG obtained reading ST 147, Increased blood transfusion to 500 cc/hr followed by NS 500 cc bolus x 2 per Tama GanderKatherine Schorr, NP.  IV team paged for stat IV access second site., Restart protonix infusion upon completion of PRBCs.  Hand off report given to TibbieMisty, Charity fundraiserN.  Will continue to monitor VS and Hgb/Hct, treat pain when BP normalizes  F/U :  0017 hrs.  90/54 ST 106  RR 18 100% on room air.  Second IV access site placed by IV team F/U :  0245 hrs   87/52  ST 108  RR 16  09% RA Event Summary: Name of Physician Notified: Tama GanderKatherine Schorr, NP at 2242    at    Outcome: Stayed in room and stabalized  Event End Time: 2330  Jules Schickrivette, Euriah Matlack K

## 2014-05-02 DIAGNOSIS — K922 Gastrointestinal hemorrhage, unspecified: Secondary | ICD-10-CM | POA: Diagnosis present

## 2014-05-02 LAB — BASIC METABOLIC PANEL
Anion gap: 3 — ABNORMAL LOW (ref 5–15)
BUN: 14 mg/dL (ref 6–23)
CO2: 21 mmol/L (ref 19–32)
Calcium: 7.4 mg/dL — ABNORMAL LOW (ref 8.4–10.5)
Chloride: 112 mmol/L (ref 96–112)
Creatinine, Ser: 0.58 mg/dL (ref 0.50–1.10)
GFR calc Af Amer: >90 mL/min (ref >90)
GFR calc non Af Amer: >90 mL/min (ref >90)
Glucose, Bld: 139 mg/dL — ABNORMAL HIGH (ref 70–99)
Potassium: 3.2 mmol/L — ABNORMAL LOW (ref 3.5–5.1)
Sodium: 136 mmol/L (ref 135–145)

## 2014-05-02 LAB — MAGNESIUM: Magnesium: 1.6 mg/dL (ref 1.5–2.5)

## 2014-05-02 LAB — TYPE AND SCREEN
ABO/RH(D): O POS
Antibody Screen: NEGATIVE
Unit division: 0
Unit division: 0
Unit division: 0

## 2014-05-02 LAB — CBC
HCT: 25.4 % — ABNORMAL LOW (ref 36.0–46.0)
Hemoglobin: 8.9 g/dL — ABNORMAL LOW (ref 12.0–15.0)
MCH: 28.6 pg (ref 26.0–34.0)
MCHC: 35 g/dL (ref 30.0–36.0)
MCV: 81.7 fL (ref 78.0–100.0)
Platelets: 220 10*3/uL (ref 150–400)
RBC: 3.11 MIL/uL — ABNORMAL LOW (ref 3.87–5.11)
RDW: 15.3 % (ref 11.5–15.5)
WBC: 10.4 10*3/uL (ref 4.0–10.5)

## 2014-05-02 LAB — HEMOGLOBIN AND HEMATOCRIT, BLOOD
HCT: 26.1 % — ABNORMAL LOW (ref 36.0–46.0)
Hemoglobin: 9.1 g/dL — ABNORMAL LOW (ref 12.0–15.0)

## 2014-05-02 LAB — POTASSIUM: Potassium: 3.7 mmol/L (ref 3.5–5.1)

## 2014-05-02 MED ORDER — PANTOPRAZOLE SODIUM 40 MG IV SOLR
8.0000 mg/h | INTRAVENOUS | Status: DC
  Administered 2014-05-02 – 2014-05-04 (×6): 8 mg/h via INTRAVENOUS
  Filled 2014-05-02 (×12): qty 80

## 2014-05-02 MED ORDER — POTASSIUM CHLORIDE CRYS ER 20 MEQ PO TBCR
40.0000 meq | EXTENDED_RELEASE_TABLET | Freq: Once | ORAL | Status: AC
  Administered 2014-05-02: 40 meq via ORAL
  Filled 2014-05-02: qty 2

## 2014-05-02 MED ORDER — NICOTINE 21 MG/24HR TD PT24
21.0000 mg | MEDICATED_PATCH | Freq: Every day | TRANSDERMAL | Status: DC
  Administered 2014-05-02 – 2014-05-05 (×4): 21 mg via TRANSDERMAL
  Filled 2014-05-02 (×4): qty 1

## 2014-05-02 MED ORDER — PANTOPRAZOLE SODIUM 40 MG IV SOLR
40.0000 mg | Freq: Two times a day (BID) | INTRAVENOUS | Status: DC
  Filled 2014-05-02: qty 40

## 2014-05-02 NOTE — Progress Notes (Signed)
[place] TEAM 1 - Stepdown/ICU TEAM Progress Note  Kimberly Weiss NWG:[id number] DOB: [birth year] DOA: 04/26/2014 PCP: Lilyan Punt, MD  Admit HPI / Brief Narrative: [name] is a 34 y/o WF PMHx chronic back pain, hypothyroidism, and pelvic pain (interstitial cystitis) and chronic opiate dependence who presented to the ED after sycnope and large volume hematemesis. The patient reports that since increasing her opiates a few weeks ago, she has been more constipated, and has been taking TUMS and GasEx for bloating. When asked about laxitives and bowel regimen, she seemed to indicate a particular aversion to these. While before she had been having 1 BM daily, she reports going every 3-4 days and as long as a week over the last month or so. She also has been taking Excedrin (unclear strength) daily for the last month, and reports taking regularly for some years. She has never had GI bleed before. She reports she did have GERD while pregnant and was on a PPI temporarily. She felt a little nauseated on the day of admission, and then lightheaded, and then an episode of syncope, followed by hematemasis. She has had no melena. She was brought to the ED where she was hypotensive and tachycardic, and found to have a Hb of 8.5 from a baseline of 15.  HPI/Subjective: 3/3 A/O 4, NAD. States positive Hematoemesis 2 days ago, positive bloody stool overnight after taking laxative. Positive nausea. negative CP, negative SOB, positive dizziness on standing.     Assessment/Plan: Acute blood loss anemia/upper GI bleed secondary Duodenal Ulcer (baseline hemoglobin~14-15) -2/27 S/P transfusion 2 units PRBC -Per GI continue Protonix infusion for 5 total days (stop date 3/3) and then if stable change to IV PPI Q 12 hours.  -H. pylori serology pending.  -Continue full liquid diet. Would not advance without GI approval. -Tx for Hgb <7% or further active bleeding  -3/1 Patient's hemoglobin has dropped one whole unit  overnight, transfuse 1 unit PRBC -Patient's hemoglobin still unstable received 2 units PRBC on 3/2. Patient now is he still losing blood will await GI/surgery decision on further treatment   Hypovolemic shock - Resolved  Dizziness -Obtain orthostatic vitals  Chronic Tobacco Abuse -Smoking cessation counseling  -PRN albuterol for wheezing   Lactic Acidosis  - Resolved   Hypothyroidism -Synthroid 25 mcg IV, transition back to PO once cleared for intake   Anxiety -Continue clonazepam 0.5 mg BID  -Continue venlafaxine 150 mg daily      Code Status: FULL Family Communication: no family present at time of exam Disposition Plan: Per GI    Consultants: Dr.Vincent Jeannine Boga (GI) Dr.Vineet Sood [facility] M)    Procedure/Significant Events: 2/27 EGD;Massive cratered Duodenal Bulb Ulcer bleed  2/27 S/P transfusion 2 units PRBC 3/1 transfuse one unit PRBC 3/2 transfused 2 units PRBC  Culture NA  Antibiotics: NA  DVT prophylaxis: SCD   Devices NA   LINES / TUBES:  NA   Continuous Infusions: . sodium chloride 125 mL/hr at 05/02/14 0713    Objective: VITAL SIGNS: Temp: 98.1 F (36.7 C) (03/03 0700) Temp Source: Oral (03/03 0700) BP: 114/70 mmHg (03/03 0700) Pulse Rate: 96 (03/03 0300) SPO2; FIO2:   Intake/Output Summary (Last 24 hours) at 05/02/14 0912 Last data filed at 05/02/14 0800  Gross per 24 hour  Intake 3781.5 ml  Output   3700 ml  Net   81.5 ml     Exam: General: A/O 4, No acute respiratory distress Lungs: Clear to auscultation bilaterally without wheezes or crackles  Cardiovascular: Tachycardic, Regular rhythm without murmur gallop or rub normal S1 and S2 Abdomen: Diffuse tenderness to palpation > LLQ, nondistended, soft, bowel sounds positive, no rebound, no ascites, no appreciable mass Extremities: No significant cyanosis, clubbing, or edema bilateral lower extremities  Data Reviewed: Basic Metabolic Panel:  Recent Labs Lab  04/27/14 1140 04/28/14 0321 04/29/14 0856 04/30/14 0828 05/02/14 0100  NA 143 140 135 140 136  K 3.9 3.3* 3.6 3.6 3.2*  CL 119* 115* 111 110 112  CO2 19 19 21 27 21   GLUCOSE 81 82 69* 100* 139*  BUN 22 15 6  <5* 14  CREATININE 0.61 0.56 0.51 0.57 0.58  CALCIUM 7.2* 7.6* 7.6* 8.3* 7.4*  MG 1.6  --   --  2.0  --   PHOS 2.0*  --   --   --   --    Liver Function Tests:  Recent Labs Lab 04/26/14 2314 04/28/14 0321 04/30/14 0828  AST 17 11 12   ALT 11 9 9   ALKPHOS 66 52 58  BILITOT 0.1* <0.1* 0.4  PROT 4.5* 4.1* 5.0*  ALBUMIN 2.4* 2.1* 2.5*    Recent Labs Lab 04/26/14 2314  LIPASE 28   No results for input(s): AMMONIA in the last 168 hours. CBC:  Recent Labs Lab 04/29/14 0856 04/30/14 0327 05/01/14 0230 05/01/14 1400 05/02/14 0100 05/02/14 0600  WBC 8.2 7.1 8.9 11.0*  --  10.4  HGB 8.2* 7.4* 7.4* 6.7* 9.1* 8.9*  HCT 25.1* 22.8* 22.1* 20.0* 26.1* 25.4*  MCV 85.4 82.9 83.4 82.0  --  81.7  PLT 244 245 217 250  --  220   Cardiac Enzymes: No results for input(s): CKTOTAL, CKMB, CKMBINDEX, TROPONINI in the last 168 hours. BNP (last 3 results) No results for input(s): BNP in the last 8760 hours.  ProBNP (last 3 results) No results for input(s): PROBNP in the last 8760 hours.  CBG:  Recent Labs Lab 04/27/14 0126  GLUCAP 84    Recent Results (from the past 240 hour(s))  MRSA PCR Screening     Status: None   Collection Time: 04/27/14  1:28 AM  Result Value Ref Range Status   MRSA by PCR NEGATIVE NEGATIVE Final    Comment:        The GeneXpert MRSA Assay (FDA approved for NASAL specimens only), is one component of a comprehensive MRSA colonization surveillance program. It is not intended to diagnose MRSA infection nor to guide or monitor treatment for MRSA infections.      Studies:  Recent x-ray studies have been reviewed in detail by the Attending Physician  Scheduled Meds:  Scheduled Meds: . amitriptyline  50 mg Oral QHS  . clonazePAM   0.5 mg Oral BID  . feeding supplement (RESOURCE BREEZE)  1 Container Oral BID BM  . fluticasone  2 spray Each Nare Daily  . levothyroxine  50 mcg Oral QAC breakfast  . pantoprazole (PROTONIX) IV  40 mg Intravenous Q12H  . polyethylene glycol  17 g Oral BID  . senna-docusate  1 tablet Oral BID  . sodium chloride  1 spray Each Nare BID  . sodium chloride  10-40 mL Intracatheter Q12H  . venlafaxine XR  150 mg Oral Q breakfast    Time spent on care of this patient: 40 mins   Drema DallasWOODS, Hilmer Aliberti, J , [facility]  [facility] Office  [phone] Pager - [phone]  On-Call/Text Page:      [email or web address]      password [email or web address]  If 7PM-7AM, please  contact night-coverage [email or web address] Password [email or web address] 05/02/2014, 9:12 AM   LOS: 5 days   Care during the described time interval was provided by me .  I have reviewed this patient's available data, including medical history, events of note, physical examination, radiology studies and test results as part of my evaluation  Carolyne Littles, MD [phone] Pager

## 2014-05-02 NOTE — Progress Notes (Signed)
Patient ID: Kimberly Weiss, female   DOB: 12/27/1980, 34 y.o.   MRN: [id number] [facility]  Kimberly Weiss 33 y.o. 03/05/1980   Subjective: Resting but easily arousable. Bloody stool last evening. No emesis last night. Denies abdominal pain. Asking for "IV pain meds"  Objective: Vital signs in last 24 hours: Filed Vitals:   05/02/14 0700  BP: 114/70  Pulse: 96  Temp: 98.1 F (36.7 C)  Resp: 15    Physical Exam: Gen: lethargic, no acute distress Abd: diffusely tender with minimal guarding, soft, nondistended, +BS  Lab Results:  Recent Labs  04/30/14 0828 05/02/14 0100  NA 140 136  K 3.6 3.2*  CL 110 112  CO2 27 21  GLUCOSE 100* 139*  BUN <5* 14  CREATININE 0.57 0.58  CALCIUM 8.3* 7.4*  MG 2.0  --     Recent Labs  04/30/14 0828  AST 12  ALT 9  ALKPHOS 58  BILITOT 0.4  PROT 5.0*  ALBUMIN 2.5*    Recent Labs  05/01/14 1400 05/02/14 0100 05/02/14 0600  WBC 11.0*  --  10.4  HGB 6.7* 9.1* 8.9*  HCT 20.0* 26.1* 25.4*  MCV 82.0  --  81.7  PLT 250  --  220   No results for input(s): LABPROT, INR in the last 72 hours.    Assessment/Plan: S/P Duodenal ulcer bleed who had a small amount of hematemesis 2 nights ago and a large red and black bloody stool last evening. Hgb 9.1 (Hgb 6.7) following 2 U PRBCs yesterday. Now Hgb 8.9. Recent bleeding concerning for recurrence of ulcer bleed vs residual blood. Hemodynamically stable. No evidence of ongoing bleeding. Will repeat EGD tomorrow morning to reassess duodenal ulcer and see if any healing has occurred. If ulcer remains massive or if visible vessel now seen, then may need to reconsider embolization vs surgery. Change to clear liquids. NPO p MN. EGD tomorrow morning. Continue Protonix drip. If rebleeding develops and persists or hemodynamics worsen, then would recommend embolization as the next step. Defer pain meds to primary team.   Bastion Bolger C. 05/02/2014, 9:15 AM

## 2014-05-03 ENCOUNTER — Inpatient Hospital Stay (HOSPITAL_COMMUNITY): Payer: Medicaid Other | Admitting: Anesthesiology

## 2014-05-03 ENCOUNTER — Encounter (HOSPITAL_COMMUNITY): Payer: Self-pay | Admitting: Anesthesiology

## 2014-05-03 ENCOUNTER — Encounter (HOSPITAL_COMMUNITY)
Admission: EM | Disposition: A | Discharge status: Home / Self Care | Payer: Self-pay | Source: Home / Self Care | Attending: Internal Medicine

## 2014-05-03 HISTORY — PX: ESOPHAGOGASTRODUODENOSCOPY (EGD) WITH PROPOFOL: SHX5813

## 2014-05-03 LAB — CBC
HCT: 26.9 % — ABNORMAL LOW (ref 36.0–46.0)
HCT: 27.6 % — ABNORMAL LOW (ref 36.0–46.0)
Hemoglobin: 9.2 g/dL — ABNORMAL LOW (ref 12.0–15.0)
Hemoglobin: 9.5 g/dL — ABNORMAL LOW (ref 12.0–15.0)
MCH: 28.9 pg (ref 26.0–34.0)
MCH: 29.2 pg (ref 26.0–34.0)
MCHC: 34.2 g/dL (ref 30.0–36.0)
MCHC: 34.4 g/dL (ref 30.0–36.0)
MCV: 84.6 fL (ref 78.0–100.0)
MCV: 84.9 fL (ref 78.0–100.0)
Platelets: 255 10*3/uL (ref 150–400)
Platelets: 276 10*3/uL (ref 150–400)
RBC: 3.18 MIL/uL — ABNORMAL LOW (ref 3.87–5.11)
RBC: 3.25 MIL/uL — ABNORMAL LOW (ref 3.87–5.11)
RDW: 15.7 % — ABNORMAL HIGH (ref 11.5–15.5)
RDW: 15.8 % — ABNORMAL HIGH (ref 11.5–15.5)
WBC: 10.4 10*3/uL (ref 4.0–10.5)
WBC: 9.7 10*3/uL (ref 4.0–10.5)

## 2014-05-03 SURGERY — ESOPHAGOGASTRODUODENOSCOPY (EGD) WITH PROPOFOL
Anesthesia: Monitor Anesthesia Care

## 2014-05-03 MED ORDER — PROPOFOL 10 MG/ML IV BOLUS
INTRAVENOUS | Status: DC | PRN
  Administered 2014-05-03: 30 mg via INTRAVENOUS
  Administered 2014-05-03: 20 mg via INTRAVENOUS
  Administered 2014-05-03 (×2): 40 mg via INTRAVENOUS
  Administered 2014-05-03: 20 mg via INTRAVENOUS

## 2014-05-03 MED ORDER — MIDAZOLAM HCL 5 MG/5ML IJ SOLN
INTRAMUSCULAR | Status: DC | PRN
  Administered 2014-05-03: 2 mg via INTRAVENOUS

## 2014-05-03 MED ORDER — LIDOCAINE HCL (CARDIAC) 20 MG/ML IV SOLN
INTRAVENOUS | Status: DC | PRN
  Administered 2014-05-03: 50 mg via INTRAVENOUS

## 2014-05-03 MED ORDER — LACTATED RINGERS IV SOLN
INTRAVENOUS | Status: DC | PRN
  Administered 2014-05-03: 08:00:00 via INTRAVENOUS

## 2014-05-03 MED ORDER — LACTATED RINGERS IV SOLN: INTRAVENOUS | Status: DC

## 2014-05-03 MED ORDER — BUTAMBEN-TETRACAINE-BENZOCAINE 2-2-14 % EX AERO
INHALATION_SPRAY | CUTANEOUS | Status: DC | PRN
  Administered 2014-05-03: 2 via TOPICAL

## 2014-05-03 MED ORDER — OXYCODONE HCL 5 MG/5ML PO SOLN: 5.0000 mg | Freq: Once | ORAL | Status: DC | PRN

## 2014-05-03 MED ORDER — FENTANYL CITRATE 0.05 MG/ML IJ SOLN
INTRAMUSCULAR | Status: DC | PRN
  Administered 2014-05-03: 100 ug via INTRAVENOUS

## 2014-05-03 MED ORDER — FENTANYL CITRATE 0.05 MG/ML IJ SOLN: 25.0000 ug | INTRAMUSCULAR | Status: DC | PRN

## 2014-05-03 MED ORDER — OXYCODONE HCL 5 MG PO TABS: 5.0000 mg | ORAL_TABLET | Freq: Once | ORAL | Status: DC | PRN

## 2014-05-03 NOTE — Interval H&P Note (Signed)
History and Physical Interval Note:  05/03/2014 8:23 AM  [name]  has presented today for surgery, with the diagnosis of GI bleed  The various methods of treatment have been discussed with the patient and family. After consideration of risks, benefits and other options for treatment, the patient has consented to  Procedure(s): ESOPHAGOGASTRODUODENOSCOPY (EGD) WITH PROPOFOL (N/A) as a surgical intervention .  The patient's history has been reviewed, patient examined, no change in status, stable for surgery.  I have reviewed the patient's chart and labs.  Questions were answered to the patient's satisfaction.     Jamiere Gulas C.

## 2014-05-03 NOTE — Transfer of Care (Signed)
Immediate Anesthesia Transfer of Care Note  Patient: Kimberly Weiss  Procedure(s) Performed: Procedure(s): ESOPHAGOGASTRODUODENOSCOPY (EGD) WITH PROPOFOL (N/A)  Patient Location: Endoscopy Unit  Anesthesia Type:MAC  Level of Consciousness: awake, alert , oriented and patient cooperative  Airway & Oxygen Therapy: Patient Spontanous Breathing and Patient connected to nasal cannula oxygen  Post-op Assessment: Report given to RN, Post -op Vital signs reviewed and stable and Patient moving all extremities  Post vital signs: Reviewed and stable  Last Vitals:  Filed Vitals:   05/03/14 0854  BP: 85/56  Pulse: 136  Temp:   Resp: 15    Complications: No apparent anesthesia complications

## 2014-05-03 NOTE — Anesthesia Preprocedure Evaluation (Signed)
Anesthesia Evaluation  Patient identified by MRN, date of birth, ID band Patient awake    Reviewed: Allergy & Precautions, NPO status , Patient's Chart, lab work & pertinent test results  Airway Mallampati: II   Neck ROM: full    Dental   Pulmonary Current Smoker,          Cardiovascular     Neuro/Psych Anxiety    GI/Hepatic PUD,   Endo/Other  Hypothyroidism   Renal/GU      Musculoskeletal   Abdominal   Peds  Hematology   Anesthesia Other Findings   Reproductive/Obstetrics                             Anesthesia Physical Anesthesia Plan  ASA: II  Anesthesia Plan: MAC   Post-op Pain Management:    Induction: Intravenous  Airway Management Planned: Nasal Cannula  Additional Equipment:   Intra-op Plan:   Post-operative Plan:   Informed Consent: I have reviewed the patients History and Physical, chart, labs and discussed the procedure including the risks, benefits and alternatives for the proposed anesthesia with the patient or authorized representative who has indicated his/her understanding and acceptance.     Plan Discussed with: CRNA, Anesthesiologist and Surgeon  Anesthesia Plan Comments:         Anesthesia Quick Evaluation

## 2014-05-03 NOTE — Progress Notes (Signed)
[place] TEAM 1 - Stepdown/ICU TEAM Progress Note  Kimberly Weiss ZOX:[id number] DOB: Feb 10, 1981 DOA: 04/26/2014 PCP: Lilyan Punt, MD  Admit HPI / Brief Narrative: 34 y/o F Hx chronic back pain, hypothyroidism, chronic pelvic pain (interstitial cystitis), and chronic opiate dependence who presented to the ED after syncope and a large volume hematemesis. The patient reported since increasing her opiates she had been more constipated. When asked about laxitives and bowel regimen, she seemed to indicate a particular aversion to these. She also had been taking Excedrin daily for a month. She felt nauseated on the day of admission, and then lightheaded, and then had an episode of syncope, followed by hematemasis. She had no melena. She was brought to the ED where she was hypotensive and tachycardic, and found to have a Hb of 8.5 from a baseline of 15.  HPI/Subjective: Pt is very anxious and tearful.  She denies current pain, or recent obvious bleeding.  She denies sob, ha, or dizzyness.      Assessment/Plan:  Acute blood loss anemia / UGIB due to Duodenal Ulcer -S/P total 5 units PRBC thus far -continue Protonix infusion for now -H. pylori serology negative  -full liquid diet - would not advance further right now -Tx for Hgb <7 or further active bleeding - baseline Hgb ~14  Hypovolemic / hemorrhagic shock - hypotension  -shock resolved, but BP remains soft - cont to follow - hydrate/transfuse as needed   Lactic Acidosis  -due to hypovolemic shock - resolved   Chronic Tobacco Abuse -Smoking cessation counseling  -PRN albuterol for wheezing   Hypothyroidism -Synthroid    Anxiety -Continue clonazepam -Continue venlafaxine   Code Status: FULL Family Communication: no family present at time of exam today  Disposition Plan: SDU until clear bleeding has stopped/Hgb is climbing   Consultants: GI  Gen Surgery  Procedure/Significant Events: 2/27 EGD massive cratered duodenal  bulb ulcer bleed  2/27 transfusion 2 units PRBC 3/1 transfusion 1 unit PRBC 3/4 repeat EGD - large duod bulb ulcer w/ slight healing - large visible vessel not actively bleeding    Antibiotics: NA  DVT prophylaxis: SCD  Objective: Blood pressure 104/70, pulse 83, temperature 98.2 F (36.8 C), temperature source Oral, resp. rate 13, height  (1.6 m), weight 72.984 kg (160 lb 14.4 oz), last menstrual period 04/12/2014, SpO2 100 %.  Intake/Output Summary (Last 24 hours) at 05/03/14 1555 Last data filed at 05/03/14 1312  Gross per 24 hour  Intake 2137.08 ml  Output   3875 ml  Net -1737.92 ml   Exam: General: No acute respiratory distress - anxious and tearful  Lungs: Clear to auscultation bilaterally without wheezes or crackles Cardiovascular: RRR - no M, G, or rub  Abdomen: Nontender, nondistended, soft, bowel sounds positive, no rebound, no ascites, no appreciable mass Extremities: No significant cyanosis, clubbing, or edema bilateral lower extremities  Data Reviewed: Basic Metabolic Panel:  Recent Labs Lab 04/27/14 1140 04/28/14 0321 04/29/14 0856 04/30/14 0828 05/02/14 0100 05/02/14 1442  NA 143 140 135 140 136  --   K 3.9 3.3* 3.6 3.6 3.2* 3.7  CL 119* 115* 111 110 112  --   CO2 --   GLUCOSE 81 82 69* 100* 139*  --   BUN <5* 14  --   CREATININE 0.61 0.56 0.51 0.57 0.58  --   CALCIUM 7.2* 7.6* 7.6* 8.3* 7.4*  --   MG 1.6  --   --  2.0  --  1.6  PHOS 2.0*  --   --   --   --   --    Liver Function Tests:  Recent Labs Lab 04/26/14 2314 04/28/14 0321 04/30/14 0828  AST 17 11 12   ALT 11 9 9   ALKPHOS 66 52 58  BILITOT 0.1* <0.1* 0.4  PROT 4.5* 4.1* 5.0*  ALBUMIN 2.4* 2.1* 2.5*    Recent Labs Lab 04/26/14 2314  LIPASE 28   CBC:  Recent Labs Lab 04/30/14 0327 05/01/14 0230 05/01/14 1400 05/02/14 0100 05/02/14 0600 05/03/14 0515  WBC 7.1 8.9 11.0*  --  10.4 10.4  HGB 7.4* 7.4* 6.7* 9.1* 8.9* 9.2*  HCT 22.8* 22.1*  20.0* 26.1* 25.4* 26.9*  MCV 82.9 83.4 82.0  --  81.7 84.6  PLT 245 217 250  --  220 255   CBG:  Recent Labs Lab 04/27/14 0126  GLUCAP 84    Recent Results (from the past 240 hour(s))  MRSA PCR Screening     Status: None   Collection Time: 04/27/14  1:28 AM  Result Value Ref Range Status   MRSA by PCR NEGATIVE NEGATIVE Final    Comment:        The GeneXpert MRSA Assay (FDA approved for NASAL specimens only), is one component of a comprehensive MRSA colonization surveillance program. It is not intended to diagnose MRSA infection nor to guide or monitor treatment for MRSA infections.      Studies:  Recent x-ray studies have been reviewed in detail by the Attending Physician  Scheduled Meds:  Scheduled Meds: . amitriptyline  50 mg Oral QHS  . clonazePAM  0.5 mg Oral BID  . feeding supplement (RESOURCE BREEZE)  1 Container Oral BID BM  . fluticasone  2 spray Each Nare Daily  . levothyroxine  50 mcg Oral QAC breakfast  . nicotine  21 mg Transdermal Daily  . [START ON 05/05/2014] pantoprazole (PROTONIX) IV  40 mg Intravenous Q12H  . polyethylene glycol  17 g Oral BID  . senna-docusate  1 tablet Oral BID  . sodium chloride  1 spray Each Nare BID  . sodium chloride  10-40 mL Intracatheter Q12H  . venlafaxine XR  150 mg Oral Q breakfast    Time spent on care of this patient: 35 mins  Lonia BloodJeffrey T. Michaela Broski, MD [facility] For Consults/Admissions - Flow Manager - [phone] Office  [phone]  Contact MD directly via text page:      [email or web address]      password [email or web address]  05/03/2014, 3:55 PM   LOS: 6 days

## 2014-05-03 NOTE — H&P (View-Only) (Signed)
Patient ID: Kimberly Weiss, female   DOB: 01/25/1981, 33 y.o.   MRN: [id number] [facility] Progress Note  Kimberly Weiss 33 y.o. 04/08/1980   Subjective: Resting but easily arousable. Bloody stool last evening. No emesis last night. Denies abdominal pain. Asking for "IV pain meds"  Objective: Vital signs in last 24 hours: Filed Vitals:   05/02/14 0700  BP: 114/70  Pulse: 96  Temp: 98.1 F (36.7 C)  Resp: 15    Physical Exam: Gen: lethargic, no acute distress Abd: diffusely tender with minimal guarding, soft, nondistended, +BS  Lab Results:  Recent Labs  04/30/14 0828 05/02/14 0100  NA 140 136  K 3.6 3.2*  CL 110 112  CO2 27 21  GLUCOSE 100* 139*  BUN <5* 14  CREATININE 0.57 0.58  CALCIUM 8.3* 7.4*  MG 2.0  --     Recent Labs  04/30/14 0828  AST 12  ALT 9  ALKPHOS 58  BILITOT 0.4  PROT 5.0*  ALBUMIN 2.5*    Recent Labs  05/01/14 1400 05/02/14 0100 05/02/14 0600  WBC 11.0*  --  10.4  HGB 6.7* 9.1* 8.9*  HCT 20.0* 26.1* 25.4*  MCV 82.0  --  81.7  PLT 250  --  220   No results for input(s): LABPROT, INR in the last 72 hours.    Assessment/Plan: S/P Duodenal ulcer bleed who had a small amount of hematemesis 2 nights ago and a large red and black bloody stool last evening. Hgb 9.1 (Hgb 6.7) following 2 U PRBCs yesterday. Now Hgb 8.9. Recent bleeding concerning for recurrence of ulcer bleed vs residual blood. Hemodynamically stable. No evidence of ongoing bleeding. Will repeat EGD tomorrow morning to reassess duodenal ulcer and see if any healing has occurred. If ulcer remains massive or if visible vessel now seen, then may need to reconsider embolization vs surgery. Change to clear liquids. NPO p MN. EGD tomorrow morning. Continue Protonix drip. If rebleeding develops and persists or hemodynamics worsen, then would recommend embolization as the next step. Defer pain meds to primary team.   Quentin Shorey C. 05/02/2014, 9:15 AM   

## 2014-05-03 NOTE — Anesthesia Postprocedure Evaluation (Signed)
Anesthesia Post Note  Patient: Toy CookeyKatherine N Hirano  Procedure(s) Performed: Procedure(s) (LRB): ESOPHAGOGASTRODUODENOSCOPY (EGD) WITH PROPOFOL (N/A)  Anesthesia type: MAC  Patient location: PACU  Post pain: Pain level controlled and Adequate analgesia  Post assessment: Post-op Vital signs reviewed, Patient's Cardiovascular Status Stable and Respiratory Function Stable  Last Vitals:  Filed Vitals:   05/03/14 0930  BP: 97/60  Pulse: 95  Temp: 36.7 C  Resp: 11    Post vital signs: Reviewed and stable  Level of consciousness: awake, alert  and oriented  Complications: No apparent anesthesia complications

## 2014-05-03 NOTE — Op Note (Signed)
[facility] [address] [place], [id number]   ENDOSCOPY PROCEDURE REPORT  PATIENT: Kimberly Weiss, Kimberly Weiss  MR#: [id number] BIRTHDATE: 1980-04-11 , 33  yrs. old GENDER: female ENDOSCOPIST: Charlott RakesVincent Traquan Duarte, MD REFERRED BY: PROCEDURE DATE:  05/03/2014 PROCEDURE:  EGD, diagnostic ASA CLASS:     Class III INDICATIONS:  acute post hemorrhagic anemia, melena, and follow up of duodenal ulcer. MEDICATIONS: Monitored anesthesia care TOPICAL ANESTHETIC:  DESCRIPTION OF PROCEDURE: After the risks benefits and alternatives of the procedure were thoroughly explained, informed consent was obtained.  The Pentax Gastroscope [id number] endoscope was introduced through the mouth and advanced to the second portion of the duodenum , Without limitations.  The instrument was slowly withdrawn as the mucosa was fully examined.    Esophagus and GEJ normal and GEJ 35 cm from the incisors. Stomach body normal. Contracted appearance of antrum and prepyloric channel with narrowing of pyloric channel. Large duodenal bulb ulcer (slightly decreased in size compared with EGD last week) seen with a wide protuberance visible and small amount of pigmented material seen adjacent to it. Protuberance concerning for a visible vessel. No active bleeding on insertion but a small amount of bleeding developed on an adjacent edematous fold from endoscopic trauma due to retching during the procedure. The examined 2nd part of the duodenum was normal but limited views due to concern with advancing endoscope past the duodenal bulb ulcer.       Retroflexed views revealed no abnormalities.     The scope was then withdrawn from the patient and the procedure completed.  COMPLICATIONS: There were no immediate complications.  ENDOSCOPIC IMPRESSION:     Large Duodenal Bulb Ulcer with Visible Vessel (no active bleeding) - high risk for rebleeding; not amenable to endoscopic therapy  RECOMMENDATIONS:      Surgery consult due to large visible vessel of large duodenal ulcer   eSigned:  Charlott RakesVincent Bush Murdoch, MD 05/03/2014 8:59 AM    CC:  CPT CODES: ICD CODES:  The ICD and CPT codes recommended by this software are interpretations from the data that the clinical staff has captured with the software.  The verification of the translation of this report to the ICD and CPT codes and modifiers is the sole responsibility of the health care institution and practicing physician where this report was generated.  [employer] will not be held responsible for the validity of the ICD and CPT codes included on this report.  AMA assumes no liability for data contained or not contained herein. CPT is a Publishing rights managerregistered trademark of the [employer].  PATIENT NAME:  Kimberly Weiss, Kimberly Weiss MR#: [id number]

## 2014-05-03 NOTE — Progress Notes (Signed)
Per pt, she has not been sexually active in over 1 yr and there is no way that she is pregnant. Dr. Chaney MallingHodierne and Theodoro Gristave, CRNA made aware. Weston SettleBethany Zabrina Brotherton, RN

## 2014-05-03 NOTE — Brief Op Note (Signed)
Large duodenal bulb ulcer again seen with slight healing but large visible vessel now visualized (no active bleeding from ulcer but high risk for rebleeding despite medical therapy). Will consult surgery. Keep NPO. Continue Protonix infusion.

## 2014-05-03 NOTE — Consult Note (Signed)
Reason for Consult: Upper GI bleeding, large duodenal bulb ulcer Referring Physician: Dr. Wilford Corner   HPI: Kimberly Weiss is a 34 year old female with a history of chronic opioid use, chronic back pain and pelvic who presented to [facility] on 04/26/14 with syncope and hematemesis.  The patient reports having intermittent abdominal pain over the last month and a half.  She has been taking gasX and tums. She also takes 2-3 Excedrin's per day for headaches.  She is a heaby 1-2ppd smoker.  She denies previous history of ulcers and reports drinking EtOH on occasion.  She denied BRBPR prior to admission.  She was found to have a hgb of 8.5 baseline being 15.  She was found to have hypovolemic shock and UGI bleeding.  She was given 2u of PRBCs.  She then had an endoscopy which showed a large duodenal bulb ulcer with no definite active bleeding.  She had a drop in H&H and an endoscopy was repeated today which once again noted the large duodenal ulcer without active bleeding. We have been asked to evaluate the patient due to high risk of re-bleeding.  Interventional radiology has been following the patient.  At present time, h&h are stable.  She has received a total of 3 units of PRBCs.  She is on Protonix IV BID, H. Pylori was negative.  The patient denies further hematemesis.  She reports dark and bright blood per rectum last night.  Her blood pressure is soft, no further syncopal episodes.  She reports pain to LUQ.  Denies nausea or vomiting.  She is tearful and anxious. She wants to get home to her son.     Past Medical History  Diagnosis Date  . Renal disorder   . Interstitial cystitis   . Thyroid disease   . Hypothyroidism   . Renal stones   . Back pain     Past Surgical History  Procedure Laterality Date  . Esophagogastroduodenoscopy N/A 04/27/2014    Procedure: ESOPHAGOGASTRODUODENOSCOPY (EGD);  Surgeon: Lear Ng, MD;  Location: [facility] ENDOSCOPY;  Service: Endoscopy;  Laterality: N/A;     History reviewed. No pertinent family history.  Social History:  reports that she has been smoking.  She does not have any smokeless tobacco history on file. She reports that she drinks alcohol. She reports that she does not use illicit drugs.  Allergies:  Allergies  Allergen Reactions  . Gabapentin Other (See Comments)    "Walking in a fog"  . Zofran [Ondansetron Hcl] Other (See Comments)    Loopy / Spaced out feeling    Medications:  Scheduled Meds: . amitriptyline  50 mg Oral QHS  . clonazePAM  0.5 mg Oral BID  . feeding supplement (RESOURCE BREEZE)  1 Container Oral BID BM  . fluticasone  2 spray Each Nare Daily  . levothyroxine  50 mcg Oral QAC breakfast  . nicotine  21 mg Transdermal Daily  . [START ON 05/05/2014] pantoprazole (PROTONIX) IV  40 mg Intravenous Q12H  . polyethylene glycol  17 g Oral BID  . senna-docusate  1 tablet Oral BID  . sodium chloride  1 spray Each Nare BID  . sodium chloride  10-40 mL Intracatheter Q12H  . venlafaxine XR  150 mg Oral Q breakfast   Continuous Infusions: . sodium chloride 1,000 mL (05/03/14 0855)  . pantoprozole (PROTONIX) infusion 8 mg/hr (05/03/14 0400)   PRN Meds:.acetaminophen, albuterol, fentaNYL, magnesium citrate, oxyCODONE, oxyCODONE **OR** oxyCODONE, promethazine, sodium chloride   Results for orders  placed or performed during the hospital encounter of 04/26/14 (from the past 48 hour(s))  CBC     Status: Abnormal   Collection Time: 05/01/14  2:00 PM  Result Value Ref Range   WBC 11.0 (H) 4.0 - 10.5 K/uL   RBC 2.44 (L) 3.87 - 5.11 MIL/uL   Hemoglobin 6.7 (LL) 12.0 - 15.0 g/dL    Comment: REPEATED TO VERIFY CRITICAL RESULT CALLED TO, READ BACK BY AND VERIFIED WITH: HOOD,T RN @ 1443 05/01/14 LEONARD,A    HCT 20.0 (L) 36.0 - 46.0 %   MCV 82.0 78.0 - 100.0 fL   MCH 27.5 26.0 - 34.0 pg   MCHC 33.5 30.0 - 36.0 g/dL   RDW 16.0 (H) 11.5 - 15.5 %   Platelets 250 150 - 400 K/uL  Prepare RBC     Status: None   Collection  Time: 05/01/14  3:00 PM  Result Value Ref Range   Order Confirmation ORDER PROCESSED BY BLOOD BANK   Basic metabolic panel     Status: Abnormal   Collection Time: 05/02/14  1:00 AM  Result Value Ref Range   Sodium 136 135 - 145 mmol/L   Potassium 3.2 (L) 3.5 - 5.1 mmol/L   Chloride 112 96 - 112 mmol/L   CO2 21 19 - 32 mmol/L   Glucose, Bld 139 (H) 70 - 99 mg/dL   BUN 14 6 - 23 mg/dL   Creatinine, Ser 0.58 0.50 - 1.10 mg/dL   Calcium 7.4 (L) 8.4 - 10.5 mg/dL   GFR calc non Af Amer >90 >90 mL/min   GFR calc Af Amer >90 >90 mL/min    Comment: (NOTE) The eGFR has been calculated using the CKD EPI equation. This calculation has not been validated in all clinical situations. eGFR's persistently <90 mL/min signify possible Chronic Kidney Disease.    Anion gap 3 (L) 5 - 15  Hemoglobin and hematocrit, blood     Status: Abnormal   Collection Time: 05/02/14  1:00 AM  Result Value Ref Range   Hemoglobin 9.1 (L) 12.0 - 15.0 g/dL    Comment: POST TRANSFUSION SPECIMEN   HCT 26.1 (L) 36.0 - 46.0 %  CBC     Status: Abnormal   Collection Time: 05/02/14  6:00 AM  Result Value Ref Range   WBC 10.4 4.0 - 10.5 K/uL   RBC 3.11 (L) 3.87 - 5.11 MIL/uL   Hemoglobin 8.9 (L) 12.0 - 15.0 g/dL   HCT 25.4 (L) 36.0 - 46.0 %   MCV 81.7 78.0 - 100.0 fL   MCH 28.6 26.0 - 34.0 pg   MCHC 35.0 30.0 - 36.0 g/dL   RDW 15.3 11.5 - 15.5 %   Platelets 220 150 - 400 K/uL  Potassium     Status: None   Collection Time: 05/02/14  2:42 PM  Result Value Ref Range   Potassium 3.7 3.5 - 5.1 mmol/L  Magnesium     Status: None   Collection Time: 05/02/14  2:42 PM  Result Value Ref Range   Magnesium 1.6 1.5 - 2.5 mg/dL  CBC     Status: Abnormal   Collection Time: 05/03/14  5:15 AM  Result Value Ref Range   WBC 10.4 4.0 - 10.5 K/uL   RBC 3.18 (L) 3.87 - 5.11 MIL/uL   Hemoglobin 9.2 (L) 12.0 - 15.0 g/dL   HCT 26.9 (L) 36.0 - 46.0 %   MCV 84.6 78.0 - 100.0 fL   MCH 28.9 26.0 - 34.0 pg  MCHC 34.2 30.0 - 36.0 g/dL    RDW 15.7 (H) 11.5 - 15.5 %   Platelets 255 150 - 400 K/uL    No results found.  Review of Systems  Constitutional: Positive for malaise/fatigue. Negative for fever, chills, weight loss and diaphoresis.  Eyes: Negative.   Respiratory: Positive for shortness of breath. Negative for cough, hemoptysis, sputum production and wheezing.   Cardiovascular: Negative for chest pain, palpitations, orthopnea, claudication, leg swelling and PND.  Gastrointestinal: Positive for abdominal pain, constipation, blood in stool and melena. Negative for nausea, vomiting and diarrhea.  Genitourinary: Negative.   Musculoskeletal: Positive for back pain.  Skin: Negative.   Neurological: Positive for headaches. Negative for weakness.  Endo/Heme/Allergies: Negative.   Psychiatric/Behavioral: Negative.    Blood pressure 97/60, pulse 95, temperature 98.1 F (36.7 C), temperature source Oral, resp. rate 11, height _0  (1.6 m), weight 160 lb 14.4 oz (72.984 kg), last menstrual period 04/12/2014, SpO2 98 %. Physical Exam  Constitutional: She is oriented to person, place, and time. She appears well-developed and well-nourished. No distress.  HENT:  Mouth/Throat: Oropharyngeal exudate present.  Eyes: Right eye exhibits no discharge. Left eye exhibits no discharge. No scleral icterus.  Cardiovascular: Normal rate, regular rhythm, normal heart sounds and intact distal pulses.  Exam reveals no gallop and no friction rub.   No murmur heard. Respiratory: Effort normal and breath sounds normal. No respiratory distress. She has no wheezes. She has no rales. She exhibits no tenderness.  GI: Soft. Bowel sounds are normal. She exhibits no distension and no mass. There is no tenderness. There is no rebound and no guarding.  Musculoskeletal: Normal range of motion. She exhibits no edema or tenderness.  Neurological: She is alert and oriented to person, place, and time.  Skin: Skin is warm. No rash noted. She is not  diaphoretic. No erythema. No pallor.  Psychiatric:  Anxious.  Normal thought content, but doesn't listen.    Assessment/Plan: UGI bleeding Large duodenal ulcer  It was difficult to explain to the patient the algorithm for bleeding which is repeat endoscopy, followed by IR embolization and then surgery if needed.  The patient seems fixated on further intervention so that it does not occur again and in order for her to get home to her son.  I tried to explain to her that we do not perform procedures unless there is an indication, but I didn't have much success at getting this message across. She is not actively bleeding.  There are no surgical indications.  Should the patient re-bleed, we recommend repeat endoscopy and if needed IR embolization followed by surgery if needed.  Thank you for the consult.  Surgery signing off, please call if needed for further assistance.     Jasmine Maceachern ANP-BC 05/03/2014, 9:34 AM

## 2014-05-04 LAB — COMPREHENSIVE METABOLIC PANEL
ALT: 11 U/L (ref 0–35)
AST: 13 U/L (ref 0–37)
Albumin: 2.3 g/dL — ABNORMAL LOW (ref 3.5–5.2)
Alkaline Phosphatase: 59 U/L (ref 39–117)
Anion gap: 4 — ABNORMAL LOW (ref 5–15)
BUN: <5 mg/dL — ABNORMAL LOW (ref 6–23)
CO2: 27 mmol/L (ref 19–32)
Calcium: 8.1 mg/dL — ABNORMAL LOW (ref 8.4–10.5)
Chloride: 107 mmol/L (ref 96–112)
Creatinine, Ser: 0.58 mg/dL (ref 0.50–1.10)
GFR calc Af Amer: >90 mL/min (ref >90)
GFR calc non Af Amer: >90 mL/min (ref >90)
Glucose, Bld: 93 mg/dL (ref 70–99)
Potassium: 3.7 mmol/L (ref 3.5–5.1)
Sodium: 138 mmol/L (ref 135–145)
Total Bilirubin: 0.5 mg/dL (ref 0.3–1.2)
Total Protein: 4.6 g/dL — ABNORMAL LOW (ref 6.0–8.3)

## 2014-05-04 LAB — CBC
HCT: 26.7 % — ABNORMAL LOW (ref 36.0–46.0)
Hemoglobin: 9 g/dL — ABNORMAL LOW (ref 12.0–15.0)
MCH: 28.4 pg (ref 26.0–34.0)
MCHC: 33.7 g/dL (ref 30.0–36.0)
MCV: 84.2 fL (ref 78.0–100.0)
Platelets: 246 10*3/uL (ref 150–400)
RBC: 3.17 MIL/uL — ABNORMAL LOW (ref 3.87–5.11)
RDW: 15.7 % — ABNORMAL HIGH (ref 11.5–15.5)
WBC: 7.1 10*3/uL (ref 4.0–10.5)

## 2014-05-04 LAB — PROTIME-INR
INR: 1.04 (ref 0.00–1.49)
Prothrombin Time: 13.7 seconds (ref 11.6–15.2)

## 2014-05-04 LAB — APTT: aPTT: 30 seconds (ref 24–37)

## 2014-05-04 MED ORDER — POLYETHYLENE GLYCOL 3350 17 G PO PACK
17.0000 g | PACK | Freq: Every day | ORAL | Status: DC
  Filled 2014-05-04: qty 1

## 2014-05-04 MED ORDER — SODIUM CHLORIDE 0.9 % IV BOLUS (SEPSIS)
500.0000 mL | Freq: Once | INTRAVENOUS | Status: AC
  Administered 2014-05-04: 500 mL via INTRAVENOUS

## 2014-05-04 MED ORDER — SENNOSIDES-DOCUSATE SODIUM 8.6-50 MG PO TABS
1.0000 | ORAL_TABLET | Freq: Every day | ORAL | Status: DC
  Filled 2014-05-04: qty 1

## 2014-05-04 MED ORDER — SUCRALFATE 1 GM/10ML PO SUSP
1.0000 g | Freq: Three times a day (TID) | ORAL | Status: DC
  Administered 2014-05-04 – 2014-05-05 (×3): 1 g via ORAL
  Filled 2014-05-04 (×8): qty 10

## 2014-05-04 NOTE — Progress Notes (Signed)
Pt BP 69/35. Pt denied any dizziness. Pulses palpable. Claiborne Billingsallahan, NP notified. 500cc bolus given. Will continue to monitor.    Alba DestineMisty L Ennis, RN, BSN

## 2014-05-04 NOTE — Progress Notes (Signed)
[place] TEAM 1 - Stepdown/ICU TEAM Progress Note  Toy CookeyKatherine N Encarnacion ZOX:[id number] DOB: 12/07/1980 DOA: 04/26/2014 PCP: Lilyan PuntLUKING,SCOTT, MD  Admit HPI / Brief Narrative: 34 y/o F Hx chronic back pain, hypothyroidism, chronic pelvic pain (interstitial cystitis), and chronic opiate dependence who presented to the ED after syncope and a large volume hematemesis. The patient reported since increasing her opiates she had been more constipated. When asked about laxitives and bowel regimen, she seemed to indicate a particular aversion to these. She also had been taking Excedrin daily for a month. She felt nauseated on the day of admission, and then lightheaded, and then had an episode of syncope, followed by hematemasis. She had no melena. She was brought to the ED where she was hypotensive and tachycardic, and found to have a Hb of 8.5 from a baseline of 15.  HPI/Subjective: Pt has numerous questions, all of which were answered.  She is less anxious today.  She passed some dark liquid stool this morning.  She c/o chronic low back pain for which she requests IV pain meds (I explained this is not appropriate given her intermittent hypotension).    Assessment/Plan:  Acute blood loss anemia / UGIB due to Duodenal Ulcer -S/P total 5 units PRBC thus far -continue Protonix infusion  -H. pylori serology negative  -full liquid diet - would not advance further right now -Tx for Hgb <7 or further active bleeding - baseline Hgb ~14  Hypovolemic / hemorrhagic shock - hypotension  -shock resolved, but BP remains soft - cont to follow - hydrate/transfuse as needed - avoid IV narcotics (not presently indicated anyway as pain is of a chronic nature)   Lactic Acidosis  -due to hypovolemic shock - resolved   Chronic Tobacco Abuse -Smoking cessation counseling  -PRN albuterol for wheezing   Hypothyroidism -Synthroid    Anxiety -Continue clonazepam -Continue venlafaxine   Code Status: FULL Family  Communication: no family present at time of exam today  Disposition Plan: SDU until clear bleeding has stopped/Hgb is climbing   Consultants: GI  Gen Surgery  Procedure/Significant Events: 2/27 EGD massive cratered duodenal bulb ulcer bleed  2/27 transfusion 2 units PRBC 3/1 transfusion 1 unit PRBC 3/4 repeat EGD - large duod bulb ulcer w/ slight healing - large visible vessel not actively bleeding    Antibiotics: NA  DVT prophylaxis: SCD  Objective: Blood pressure 97/63, pulse 106, temperature 97.5 F (36.4 C), temperature source Oral, resp. rate 17, height 5\' 3"  (1.6 m), weight 73.6 kg (162 lb 4.1 oz), last menstrual period 04/12/2014, SpO2 100 %.  Intake/Output Summary (Last 24 hours) at 05/04/14 1432 Last data filed at 05/04/14 1037  Gross per 24 hour  Intake 1305.33 ml  Output   2450 ml  Net -1144.67 ml   Exam: General: No acute respiratory distress - calm and conversant  Lungs: Clear to auscultation bilaterally without wheezes or crackles Cardiovascular: RRR - no M, G, or rub  Abdomen: Nontender, nondistended, soft, bowel sounds positive, no rebound, no ascites, no appreciable mass Extremities: No significant cyanosis, clubbing, or edema bilateral lower extremities  Data Reviewed: Basic Metabolic Panel:  Recent Labs Lab 04/28/14 0321 04/29/14 0856 04/30/14 0828 05/02/14 0100 05/02/14 1442 05/04/14 0518  NA 140 135 140 136  --  138  K 3.3* 3.6 3.6 3.2* 3.7 3.7  CL 115* 111 110 112  --  107  CO2 19 21 27 21   --  27  GLUCOSE 82 69* 100* 139*  --  93  BUN 15 6 <5* 14  --  <5*  CREATININE 0.56 0.51 0.57 0.58  --  0.58  CALCIUM 7.6* 7.6* 8.3* 7.4*  --  8.1*  MG  --   --  2.0  --  1.6  --    Liver Function Tests:  Recent Labs Lab 04/28/14 0321 04/30/14 0828 05/04/14 0518  AST ALT ALKPHOS 52 58 59  BILITOT <0.1* 0.4 0.5  PROT 4.1* 5.0* 4.6*  ALBUMIN 2.1* 2.5* 2.3*   CBC:  Recent Labs Lab 05/01/14 1400 05/02/14 0100  05/02/14 0600 05/03/14 0515 05/03/14 1853 05/04/14 0518  WBC 11.0*  --  10.4 10.4 9.7 7.1  HGB 6.7* 9.1* 8.9* 9.2* 9.5* 9.0*  HCT 20.0* 26.1* 25.4* 26.9* 27.6* 26.7*  MCV 82.0  --  81.7 84.6 84.9 84.2  PLT 250  --  220 255 276 246     Recent Results (from the past 240 hour(s))  MRSA PCR Screening     Status: None   Collection Time: 04/27/14  1:28 AM  Result Value Ref Range Status   MRSA by PCR NEGATIVE NEGATIVE Final    Comment:        The GeneXpert MRSA Assay (FDA approved for NASAL specimens only), is one component of a comprehensive MRSA colonization surveillance program. It is not intended to diagnose MRSA infection nor to guide or monitor treatment for MRSA infections.      Studies:  Recent x-ray studies have been reviewed in detail by the Attending Physician  Scheduled Meds:  Scheduled Meds: . amitriptyline  50 mg Oral QHS  . clonazePAM  0.5 mg Oral BID  . feeding supplement (RESOURCE BREEZE)  1 Container Oral BID BM  . fluticasone  2 spray Each Nare Daily  . levothyroxine  50 mcg Oral QAC breakfast  . nicotine  21 mg Transdermal Daily  . [START ON 05/05/2014] pantoprazole (PROTONIX) IV  40 mg Intravenous Q12H  . polyethylene glycol  17 g Oral BID  . senna-docusate  1 tablet Oral BID  . sodium chloride  1 spray Each Nare BID  . sodium chloride  10-40 mL Intracatheter Q12H  . sucralfate  1 g Oral TID WC & HS  . venlafaxine XR  150 mg Oral Q breakfast    Time spent on care of this patient: 35 mins  Lonia Blood, MD [facility] For Consults/Admissions - Flow Manager - [phone] Office  [phone]  Contact MD directly via text page:      [email or web address]      password [facility]  05/04/2014, 2:32 PM   LOS: 7 days

## 2014-05-04 NOTE — Progress Notes (Signed)
Dark liquid stools x3 this a.m., without rise in BUN or signif drop in hgb.  Pt feels basically ok.  Abd is nontender, skin is warm and dry, VS ok although this pt's BP does run on the low side.  IMPR:   1.  Large duod ulcer w/ high risk of re-bleeding based on presence of visible vessel, although that risk will diminish progressively over the next several days 2.  Post hemorrhagic anemia, acute, stable, s/p Tx  RECOMM:  1.  Agree w/ Protonix infusion (transitions to IV bolus in 1 day); after a couple of days, pt should be out of the woods w/ respect to recurrent bleeding, and could go on PO Protonix (40 bid) at that time; would send her home on bid PPI. 2.  Have also ordered sucralfate to help "patch" the ulcer and act as an adjunctive agent for ulcer healing.  Although I don't think pt has to be dischg'd on this med, I would continue it until dischg 3.  IF PT RE-BLEEDS, SHE IS NOT A GOOD ENDOSCOPY CANDIDATE BECAUSE THE OBSERVED LESION IS NOT LIKELY TO BE ENDOSCOPICALLY REMEDIABLE.  Would therefore go straight to interventional radiology/embolization if pt has an acute, destabilizing re-bleed. 4.  At dischg would arrange outpt f/u w/ Dr. Bosie ClosSchooler for about a month from now to consider doing a f/u egd to confirm ulcer healing 5.  Will sign off; call if we can be of further help.  Florencia Reasonsobert V. Tomasita Beevers, M.D. [phone]

## 2014-05-05 ENCOUNTER — Inpatient Hospital Stay (HOSPITAL_COMMUNITY): Payer: Medicaid Other

## 2014-05-05 ENCOUNTER — Encounter (HOSPITAL_COMMUNITY): Payer: Self-pay | Admitting: Radiology

## 2014-05-05 DIAGNOSIS — K264 Chronic or unspecified duodenal ulcer with hemorrhage: Secondary | ICD-10-CM | POA: Insufficient documentation

## 2014-05-05 LAB — CBC
HCT: 17.2 % — ABNORMAL LOW (ref 36.0–46.0)
HCT: 27.6 % — ABNORMAL LOW (ref 36.0–46.0)
HCT: 29.5 % — ABNORMAL LOW (ref 36.0–46.0)
Hemoglobin: 5.7 g/dL — CL (ref 12.0–15.0)
Hemoglobin: 9.2 g/dL — ABNORMAL LOW (ref 12.0–15.0)
Hemoglobin: 9.9 g/dL — ABNORMAL LOW (ref 12.0–15.0)
MCH: 28.4 pg (ref 26.0–34.0)
MCH: 29 pg (ref 26.0–34.0)
MCH: 29.2 pg (ref 26.0–34.0)
MCHC: 33.1 g/dL (ref 30.0–36.0)
MCHC: 33.3 g/dL (ref 30.0–36.0)
MCHC: 33.6 g/dL (ref 30.0–36.0)
MCV: 85.6 fL (ref 78.0–100.0)
MCV: 87 fL (ref 78.0–100.0)
MCV: 87.1 fL (ref 78.0–100.0)
Platelets: 187 10*3/uL (ref 150–400)
Platelets: 190 10*3/uL (ref 150–400)
Platelets: 286 10*3/uL (ref 150–400)
RBC: 2.01 MIL/uL — ABNORMAL LOW (ref 3.87–5.11)
RBC: 3.17 MIL/uL — ABNORMAL LOW (ref 3.87–5.11)
RBC: 3.39 MIL/uL — ABNORMAL LOW (ref 3.87–5.11)
RDW: 14.9 % (ref 11.5–15.5)
RDW: 15.7 % — ABNORMAL HIGH (ref 11.5–15.5)
RDW: 15.8 % — ABNORMAL HIGH (ref 11.5–15.5)
WBC: 6.2 10*3/uL (ref 4.0–10.5)
WBC: 7.8 10*3/uL (ref 4.0–10.5)
WBC: 7.9 10*3/uL (ref 4.0–10.5)

## 2014-05-05 LAB — BASIC METABOLIC PANEL
Anion gap: 6 (ref 5–15)
BUN: 12 mg/dL (ref 6–23)
CO2: 24 mmol/L (ref 19–32)
Calcium: 6.9 mg/dL — ABNORMAL LOW (ref 8.4–10.5)
Chloride: 108 mmol/L (ref 96–112)
Creatinine, Ser: 0.58 mg/dL (ref 0.50–1.10)
GFR calc Af Amer: >90 mL/min (ref >90)
GFR calc non Af Amer: >90 mL/min (ref >90)
Glucose, Bld: 136 mg/dL — ABNORMAL HIGH (ref 70–99)
Potassium: 6.6 mmol/L (ref 3.5–5.1)
Sodium: 138 mmol/L (ref 135–145)

## 2014-05-05 LAB — POTASSIUM: Potassium: 3.6 mmol/L (ref 3.5–5.1)

## 2014-05-05 LAB — CG4 I-STAT (LACTIC ACID): Lactic Acid, Venous: 0.94 mmol/L (ref 0.5–2.0)

## 2014-05-05 LAB — PREPARE RBC (CROSSMATCH)

## 2014-05-05 LAB — MAGNESIUM: Magnesium: 1.8 mg/dL (ref 1.5–2.5)

## 2014-05-05 MED ORDER — MIDAZOLAM HCL 2 MG/2ML IJ SOLN
INTRAMUSCULAR | Status: AC
  Filled 2014-05-05: qty 2

## 2014-05-05 MED ORDER — SODIUM CHLORIDE 0.9 % IV SOLN
Freq: Once | INTRAVENOUS | Status: AC
  Administered 2014-05-05: 10 mL/h via INTRAVENOUS

## 2014-05-05 MED ORDER — SODIUM CHLORIDE 0.9 % IV SOLN
INTRAVENOUS | Status: AC
  Administered 2014-05-05: 18:00:00 via INTRAVENOUS

## 2014-05-05 MED ORDER — LIDOCAINE HCL 1 % IJ SOLN
INTRAMUSCULAR | Status: AC
Start: 2014-05-05 — End: 2014-05-05
  Filled 2014-05-05: qty 20

## 2014-05-05 MED ORDER — SODIUM CHLORIDE 0.9 % IV SOLN
8.0000 mg/h | INTRAVENOUS | Status: AC
  Administered 2014-05-05 – 2014-05-08 (×5): 8 mg/h via INTRAVENOUS
  Filled 2014-05-05 (×13): qty 80

## 2014-05-05 MED ORDER — LORAZEPAM 2 MG/ML IJ SOLN: 0.5000 mg | Freq: Four times a day (QID) | INTRAMUSCULAR | Status: DC | PRN

## 2014-05-05 MED ORDER — LEVOTHYROXINE SODIUM 100 MCG IV SOLR
25.0000 ug | Freq: Every day | INTRAVENOUS | Status: DC
Start: 2014-05-06 — End: 2014-05-13
  Administered 2014-05-06 – 2014-05-13 (×8): 25 ug via INTRAVENOUS
  Filled 2014-05-05 (×12): qty 5

## 2014-05-05 MED ORDER — HYDROMORPHONE HCL 1 MG/ML IJ SOLN
1.0000 mg | INTRAMUSCULAR | Status: DC | PRN
  Administered 2014-05-05 – 2014-05-07 (×10): 1 mg via INTRAVENOUS
  Filled 2014-05-05 (×11): qty 1

## 2014-05-05 MED ORDER — SODIUM CHLORIDE 0.9 % IV SOLN: Freq: Once | INTRAVENOUS | Status: DC

## 2014-05-05 MED ORDER — IOHEXOL 300 MG/ML  SOLN
150.0000 mL | Freq: Once | INTRAMUSCULAR | Status: AC | PRN
  Administered 2014-05-05: 175 mL via INTRA_ARTERIAL

## 2014-05-05 MED ORDER — FENTANYL CITRATE 0.05 MG/ML IJ SOLN
INTRAMUSCULAR | Status: AC
  Filled 2014-05-05: qty 2

## 2014-05-05 MED ORDER — SODIUM CHLORIDE 0.9 % IV BOLUS (SEPSIS)
1000.0000 mL | Freq: Once | INTRAVENOUS | Status: AC
  Administered 2014-05-05: 1000 mL via INTRAVENOUS

## 2014-05-05 MED ORDER — SODIUM CHLORIDE 0.9 % IV SOLN
INTRAVENOUS | Status: AC | PRN
  Administered 2014-05-05: 10 mL/h via INTRAVENOUS

## 2014-05-05 MED ORDER — HYDROMORPHONE HCL 1 MG/ML IJ SOLN
0.5000 mg | INTRAMUSCULAR | Status: DC | PRN
  Administered 2014-05-05: 0.5 mg via INTRAVENOUS
  Filled 2014-05-05: qty 1

## 2014-05-05 MED ORDER — MIDAZOLAM HCL 2 MG/2ML IJ SOLN
INTRAMUSCULAR | Status: AC | PRN
  Administered 2014-05-05 (×2): 1 mg via INTRAVENOUS

## 2014-05-05 MED ORDER — LORAZEPAM 2 MG/ML IJ SOLN
0.5000 mg | INTRAMUSCULAR | Status: DC | PRN
  Administered 2014-05-06 – 2014-05-07 (×3): 1 mg via INTRAVENOUS
  Administered 2014-05-07: 0.5 mg via INTRAVENOUS
  Administered 2014-05-07: 1 mg via INTRAVENOUS
  Administered 2014-05-08: 0.5 mg via INTRAVENOUS
  Administered 2014-05-08: 1 mg via INTRAVENOUS
  Administered 2014-05-08: 0.5 mg via INTRAVENOUS
  Administered 2014-05-08 – 2014-05-11 (×9): 1 mg via INTRAVENOUS
  Filled 2014-05-05 (×18): qty 1

## 2014-05-05 MED ORDER — FENTANYL CITRATE 0.05 MG/ML IJ SOLN
INTRAMUSCULAR | Status: AC | PRN
  Administered 2014-05-05 (×2): 50 ug via INTRAVENOUS

## 2014-05-05 NOTE — Progress Notes (Addendum)
When nurse received report from night nurse she stated pt's blood pressure had been running low off and on. When nurse entered pt's room at 0900 am pt was anxious and stated she felt faint. Nurse took blood pressure and it was in systolic 70's. Pt stated she was nauseated and had pain in abdomin. Nurse paged Dr. Sharon SellerMCClung and notified. Nurse gave pt phenergan per md orders. Pt also received pain medication for pain per md orders for pain rating a 10/10.  Pt had a bowel movement around 0900 am that was black in color and watery. At this time pt stated she felt like she was going to pass out.  Nurse also notified Dr. Sharon SellerMCClung that pt's heart rate went from 140's to 170. Pt was very anxious but also blood pressure very low. Started a bolus of normal saline immediately via PICC line in pt's right upper arm per md orders. Nurse stayed with pt during this entire time.  Drew a stat CBC via PICC and sent to lab. Also did a type and screen as previous type and screen expired. Pt started to feel a little better after receiving first 1 liter bolus. During this time pt stayed alert and oriented x 4.

## 2014-05-05 NOTE — Progress Notes (Addendum)
CRITICAL VALUE ALERT  Critical value received:  Hemoglobin of 5.7 Date of notification: 05/04/09  Time of notification:  1035  Critical value read back:  Nurse who received alert: Lynwood Dawleyara RN  MD notified (1st page):  Dr. Sharon SellerMCClung  Time of first page:  1035  MD notified (2nd page):  Time of second page:  Responding MD:  Dr. Sharon SellerMCClung  Time MD responded: [phone]

## 2014-05-05 NOTE — Progress Notes (Addendum)
[facility] TEAM 1 - Stepdown/ICU TEAM Progress Note  Kimberly Weiss ZOX:[id number] DOB: 30-May-1980 DOA: 04/26/2014 PCP: Lilyan Punt, MD  Admit HPI / Brief Narrative: 34 y/o F Hx chronic back pain, hypothyroidism, chronic pelvic pain (interstitial cystitis), and chronic opiate dependence who presented to the ED after syncope and a large volume hematemesis. The patient reported since increasing her opiates she had been more constipated. When asked about laxitives and bowel regimen, she seemed to indicate a particular aversion to these. She also had been taking Excedrin daily for a month. She felt nauseated on the day of admission, and then lightheaded, and then had an episode of syncope, followed by hematemasis. She had no melena. She was brought to the ED where she was hypotensive and tachycardic, and found to have a Hb of 8.5 from a baseline of 15.  HPI/Subjective: Pt developed the acute onset of a sharp stabbing epigastric pain this morning, associated w/ intense nausea.  She then moved from bed to the bedside commode where she has a watery black stool (no change from recent bowel movements), immediately following which she felt presyncopal.  She was helped back to bed and was found to be hypotensive with systolics in the 70s.  She became extremely anxious, and then tachycardic.    I presented to the bedside from the ED, and found her to be alert and oriented, though tremulous and in tears.  She has completed a "wide open" 1L bolus, and a STAT CBC has been sent off.  Her BP is now 85-100 systolic, and her HR varies from 100-125 in a sinus tachy pattern.  She is not presently nauseated, no longer has abdom pain, has not vomited, and denies chest pain.    F/U at 10:30AM - Hgb resulted at 5.7 indicative of signif bleeding - bolus w/ another 1L of NS - called IR who is coming to see for emergent embolization - discussed w/ PCCM and will move to ICU for monitoring - alerted Gen Surg to situation and  possible need for surgery should IR intervention not prove to be helpful - ordering an additional 2U of PRBC to be ready   Assessment/Plan:  Acute hypotensive episode I suspect this represents a vas-vagal episode combined w/ severe situational anxiety (explaining the rebound tachycardia) - this could however be due to brisk sudden bleeding from her large duod ulcer and therefore we are tx it as such until proven otherwise - I have asked for 2U PRBC to be crossed and ready for transfusion - she has been given a 1L NS bolus - a STAT CBC is currently pending - if she proves to be acutely bleeding, I will alert IR emergently, as well as Gen Surg (for backup in case embolization not successful)  Acute blood loss anemia / UGIB due to Duodenal Ulcer -S/P total 5 units PRBC thus far -Protonix to change to Q12 dosing today  -H. pylori serology negative  -full liquid diet to resume if Hgb stable - for now is NPO  -Tx for Hgb <7 or further active bleeding - baseline Hgb ~14  Hypovolemic / hemorrhagic shock - hypotension  -shock resolved, but BP remains soft w/ intermittent dips - cont to follow - hydrate/transfuse as needed - avoid IV narcotics (not presently indicated anyway as pain is of a chronic nature)   Lactic Acidosis  -due to hypovolemic shock - resolved   Chronic Tobacco Abuse -Smoking cessation counseling  -PRN albuterol for wheezing   Hypothyroidism -Synthroid  Anxiety -Continue clonazepam -Continue venlafaxine   Code Status: FULL Family Communication: no family present at time of exam today  Disposition Plan: SDU   Consultants: GI  Gen Surgery  Procedure/Significant Events: 2/27 EGD massive cratered duodenal bulb ulcer bleed  2/27 transfusion 2 units PRBC 3/1 transfusion 1 unit PRBC 3/4 repeat EGD - large duod bulb ulcer w/ slight healing - large visible vessel not actively bleeding    Antibiotics: NA  DVT prophylaxis: SCD  Objective: Blood pressure 77/49,  pulse 91, temperature 97.9 F (36.6 C), temperature source Oral, resp. rate 15, height  (1.6 m), weight 73.6 kg (162 lb 4.1 oz), last menstrual period 04/12/2014, SpO2 100 %.  Intake/Output Summary (Last 24 hours) at 05/05/14 1021 Last data filed at 05/05/14 0934  Gross per 24 hour  Intake    545 ml  Output    702 ml  Net   -157 ml   Exam: General: No acute respiratory distress - anxious w/ tremors and crying  Lungs: Clear to auscultation bilaterally without wheezes or crackles Cardiovascular: tachycardic but regular - no M or gallup Abdomen: Nontender even in epigastrium, nondistended, soft, bowel sounds positive, no rebound, no ascites, no appreciable mass  Extremities: No significant cyanosis, clubbing, or edema bilateral lower extremities  Data Reviewed: Basic Metabolic Panel:  Recent Labs Lab 04/29/14 0856 04/30/14 0828 05/02/14 0100 05/02/14 1442 05/04/14 0518  NA 135 140 136  --  138  K 3.6 3.6 3.2* 3.7 3.7  CL 111 110 112  --  107  CO2 --  27  GLUCOSE 69* 100* 139*  --  93  BUN 6 <5* 14  --  <5*  CREATININE 0.51 0.57 0.58  --  0.58  CALCIUM 7.6* 8.3* 7.4*  --  8.1*  MG  --  2.0  --  1.6  --    Liver Function Tests:  Recent Labs Lab 04/30/14 0828 05/04/14 0518  AST 12 13  ALT 9 11  ALKPHOS 58 59  BILITOT 0.4 0.5  PROT 5.0* 4.6*  ALBUMIN 2.5* 2.3*   CBC:  Recent Labs Lab 05/02/14 0600 05/03/14 0515 05/03/14 1853 05/04/14 0518 05/05/14 0003  WBC 10.4 10.4 9.7 7.1 7.9  HGB 8.9* 9.2* 9.5* 9.0* 9.2*  HCT 25.4* 26.9* 27.6* 26.7* 27.6*  MCV 81.7 84.6 84.9 84.2 87.1  PLT 220 255 276 246 286     Recent Results (from the past 240 hour(s))  MRSA PCR Screening     Status: None   Collection Time: 04/27/14  1:28 AM  Result Value Ref Range Status   MRSA by PCR NEGATIVE NEGATIVE Final    Comment:        The GeneXpert MRSA Assay (FDA approved for NASAL specimens only), is one component of a comprehensive MRSA colonization surveillance  program. It is not intended to diagnose MRSA infection nor to guide or monitor treatment for MRSA infections.      Studies:  Recent x-ray studies have been reviewed in detail by the Attending Physician  Scheduled Meds:  Scheduled Meds: . sodium chloride   Intravenous Once  . amitriptyline  50 mg Oral QHS  . clonazePAM  0.5 mg Oral BID  . feeding supplement (RESOURCE BREEZE)  1 Container Oral BID BM  . fluticasone  2 spray Each Nare Daily  . levothyroxine  50 mcg Oral QAC breakfast  . nicotine  21 mg Transdermal Daily  . pantoprazole (PROTONIX) IV  40 mg Intravenous Q12H  .  polyethylene glycol  17 g Oral Daily  . senna-docusate  1 tablet Oral QHS  . sodium chloride  1 spray Each Nare BID  . sodium chloride  10-40 mL Intracatheter Q12H  . sucralfate  1 g Oral TID WC & HS  . venlafaxine XR  150 mg Oral Q breakfast    Time spent on care of this patient: 35 mins  Lonia BloodJeffrey T. McClung, MD [facility] For Consults/Admissions - Flow Manager - [phone] Office  [phone]  Contact MD directly via text page:      [email or web address]      password [email or web address]  05/05/2014, 10:21 AM   LOS: 8 days

## 2014-05-05 NOTE — Progress Notes (Signed)
PULMONARY / CRITICAL CARE MEDICINE   Name: Kimberly Weiss MRN: [id number] DOB: 1980-10-11    ADMISSION DATE:  04/26/2014  PRIMARY SERVICE: PCCM  CHIEF COMPLAINT:  Syncope, hematemesis  BRIEF PATIENT DESCRIPTION: 34 y/o woman admitted 2/26 after syncopal episode.   Work up concerning for UGI bleed likely due to NSAID usage. PCCM consulted for ICU admission 2/26 .  Found to have large UGIB d/t duodenal ulcer on EGD  .Initially required 5 u PRBC , tx w/ PPI stable to TRH on 3/1 . Reconsulted 3/6 for  Hypotension/GI bleed. Pt transferred to ICU . IR eval for possible embolization .   SIGNIFICANT EVENTS / STUDIES:  2/26  Admit with syncope prior to presentation, concern for UGIB.  Hgb 8.2 from 15 2/28  No further bleeding, pt seeking pain meds, getting out of bed without assistance 2/28 Massive cratered duodenal bulb ulcer noted on EGD 3/6 Near syncopal episode w/ black stool , sharp drop in hbg and hypotension   SUBJECTIVE:   Near syncopal episode w/ black stool , sharp drop in hbg and hypotension  PCCM reconsulted -pt to go to ICU  Pt alert and talking in bed, tearful  Black stool this am . B/p responding to IVF challenge,  For 2 u PRBC IR eval for embolization this am    VITAL SIGNS: Temp:  [97.5 F (36.4 C)-98.6 F (37 C)] 97.9 F (36.6 C) (03/06 0722) Pulse Rate:  [79-113] 91 (03/06 0722) Resp:  [11-18] 15 (03/06 0722) BP: (77-121)/(41-64) 77/49 mmHg (03/06 0722) SpO2:  [99 %-100 %] 100 % (03/06 0722)   INTAKE / OUTPUT: Intake/Output      03/05 0701 - 03/06 0700 03/06 0701 - 03/07 0700   I.V. (mL/kg) 675 (9.2) 10 (0.1)   Total Intake(mL/kg) 675 (9.2) 10 (0.1)   Urine (mL/kg/hr) 950 (0.5) 1 (0)   Stool 0 (0) 1 (0)   Total Output 950 2   Net -275 +8        Urine Occurrence 2 x 1 x   Stool Occurrence 4 x      PHYSICAL EXAMINATION: General:  F alert in bed  Neuro:  CN grossly intact, mentating well. HEENT:  MM pink/moist, good dentition  Neck: No  JVD Cardiovascular:  s1s2 rrr no m/r/g Lungs:  CTAB Abdomen:  Soft, hyperactive bowel sounds, gen tender  Musculoskeletal:  No deformities Skin:  No rashes  LABS:  CBC  Recent Labs Lab 05/04/14 0518 05/05/14 0003 05/05/14 0948  WBC 7.1 7.9 6.2  HGB 9.0* 9.2* 5.7*  HCT 26.7* 27.6* 17.2*  PLT 246 286 187   Coag's  Recent Labs Lab 05/04/14 0518  APTT 30  INR 1.04   BMET  Recent Labs Lab 04/30/14 0828 05/02/14 0100 05/02/14 1442 05/04/14 0518  NA 140 136  --  138  K 3.6 3.2* 3.7 3.7  CL 110 112  --  107  CO2 27 21  --  27  BUN <5* 14  --  <5*  CREATININE 0.57 0.58  --  0.58  GLUCOSE 100* 139*  --  93   Electrolytes  Recent Labs Lab 04/30/14 0828 05/02/14 0100 05/02/14 1442 05/04/14 0518  CALCIUM 8.3* 7.4*  --  8.1*  MG 2.0  --  1.6  --    Sepsis Markers No results for input(s): LATICACIDVEN, PROCALCITON, O2SATVEN in the last 168 hours. Liver Enzymes  Recent Labs Lab 04/30/14 0828 05/04/14 0518  AST 12 13  ALT 9 11  ALKPHOS  58 59  BILITOT 0.4 0.5  ALBUMIN 2.5* 2.3*   Glucose No results for input(s): GLUCAP in the last 168 hours.  Imaging No results found.   ASSESSMENT / PLAN:  PULMONARY A: Chronic Tobacco Abuse P:   Smoking cessation 0 /nicotine patch d/c for now  PRN albuterol for wheezing  Pulmonary hygiene Nasal hygiene - flonase + saline spray   CARDIOVASCULAR A: Hypovolemic shock -Hemorrhagic  P:   Fluid bolus  PRBC transfusion  Transfer to ICU  Add pressors if not responding  Check lactate     RENAL A: Lactic Acidosis - resolving Chronic cysttis P:   Strict I/O  Check bmet  Replace electrolytes as indicated     GASTROINTESTINAL A: Major Upper GI Bleed - likely in setting of NSAID / ETOH usage.  Massive cratered duodenal bulb ulcer noted on EGD 3/6 Rebleed >to go to IR for embolization  P:   Protonix gtt GI Following,   NPO D/c constipation meds  For now     HEMATOLOGIC A: Anemia - due to  hemorrhage. P:    2 units PRBC, 2 on hold  CBC serial     INFECTIOUS A: No active issues P:   Monitor fever curve / WBC  ENDOCRINE A: Hypothyroidism P:   Synthroid 25 mcg IV, transition back to PO once cleared for intake   NEUROLOGIC A: Anxiety BZD dependence Opiate dependence P:   Avoid  sedating rx if possible  Hold  elavil, klonopin, percocet 10, effexor for now  PRN IV ativan for anxiety PRN dilaudid for pain    Tammy Parrett NP-C  [facility]  [phone]   Attending:  I have seen and examined the patient with nurse practitioner/resident and agree with the note above.   No GI bleeding since this morning IR study negative Hopefully she has stopped bleeding Monitor CBC, if drops or if rebleeds then send for tagged RBC  Monitor in ICU  Heber CarolinaBrent Dawnita Molner, MD [facility] Pager: [phone] Cell: [phone] If no response, call [phone]

## 2014-05-05 NOTE — Progress Notes (Signed)
eLink Physician-Brief Progress Note Patient Name: Toy CookeyKatherine N Mcclain DOB: 11/09/1980 MRN: [id number]   Date of Service  05/05/2014  HPI/Events of Note  Gi bleed  eICU Interventions  Spoke with Dr. Loreta AveWagner (VIR), stated nothing to embolize, no active bleeding noted; may have variant anatomy.  Suggested tagged RBC scan.  IR will follow up again tomorrow.         Brandi Armato 05/05/2014, 3:51 PM

## 2014-05-05 NOTE — Progress Notes (Signed)
PT Cancellation Note  Patient Details Name: Kimberly Weiss MRN: [id number] DOB: 12/13/1980   Cancelled Treatment:    Reason Eval/Treat Not Completed: Medical issues which prohibited therapy.  Patient transferred to ICU due to hypotension, presyncopal episode, drop in Hgb to 5.7.  To IR for embolization.  Will return tomorrow for PT evaluation if appropriate for patient.   Vena AustriaDavis, [name] 05/05/2014, 12:01 PM [name], PT, [facility] Pager [phone]

## 2014-05-05 NOTE — Sedation Documentation (Signed)
Patient denies pain and is resting comfortably.  

## 2014-05-05 NOTE — Progress Notes (Signed)
Gave report to ICU nurse and pt going to 2 south room 14. Notified telemetry. Pt received 2 liters  In total of normal saline bolus prior to going to ICU.

## 2014-05-05 NOTE — Progress Notes (Signed)
Referring Physician(s): CCM  Subjective: Patient with c/o near syncope episode today and continued black tarry stools, s/p EGD with known duodenal ulcer.   Allergies: Gabapentin and Zofran  Medications: Prior to Admission medications   Medication Sig Start Date End Date Taking? Authorizing Provider  albuterol (PROVENTIL HFA;VENTOLIN HFA) 108 (90 BASE) MCG/ACT inhaler Inhale 2 puffs into the lungs every 6 (six) hours as needed for wheezing or shortness of breath.   Yes Historical Provider, MD  amitriptyline (ELAVIL) 50 MG tablet TAKE 1 TABLET (50 MG TOTAL) BY MOUTH AT BEDTIME. 03/04/14  Yes Babs Sciara, MD  Aspirin-Acetaminophen-Caffeine (EXCEDRIN PO) Take 2 tablets by mouth 2 (two) times daily as needed. For pain   Yes Historical Provider, MD  clonazePAM (KLONOPIN) 1 MG tablet 1/2-1 po BID prn panic attacks 04/01/14  Yes Campbell Riches, NP  hydrochlorothiazide (HYDRODIURIL) 25 MG tablet Take 25 mg by mouth daily.   Yes Historical Provider, MD  levothyroxine (SYNTHROID, LEVOTHROID) 50 MCG tablet TAKE 1 TABLET EVERY DAY 04/17/14  Yes Babs Sciara, MD  oxyCODONE-acetaminophen (PERCOCET) 10-325 MG per tablet Take 1 tablet by mouth every 6 (six) hours as needed for pain. 01/28/14  Yes Babs Sciara, MD  venlafaxine XR (EFFEXOR-XR) 150 MG 24 hr capsule Take 150 mg by mouth daily with breakfast.   Yes Historical Provider, MD  amitriptyline (ELAVIL) 50 MG tablet TAKE 1 TABLET (50 MG TOTAL) BY MOUTH AT BEDTIME. 04/01/14   Merlyn Albert, MD  ranitidine (ZANTAC) 150 MG capsule Take 1 capsule (150 mg total) by mouth daily. Patient not taking: Reported on 04/26/2014 11/15/13   Roxy Horseman, PA-C  sucralfate (CARAFATE) 1 GM/10ML suspension Take 10 mLs (1 g total) by mouth 4 (four) times daily -  with meals and at bedtime. Patient not taking: Reported on 04/26/2014 11/15/13   Roxy Horseman, PA-C    Vital Signs: BP 77/49 mmHg  Pulse 91  Temp(Src) 97.9 F (36.6 C) (Oral)  Resp 15  Ht 5'  3" (1.6 m)  Wt 162 lb 4.1 oz (73.6 kg)  BMI 28.75 kg/m2  SpO2 100%  LMP 04/12/2014  Physical Exam  Constitutional: She is oriented to person, place, and time. No distress.  HENT:  Head: Normocephalic and atraumatic.  Neck: No tracheal deviation present.  Cardiovascular: Regular rhythm and intact distal pulses.  Exam reveals no gallop and no friction rub.   No murmur heard. Tachycardic, DP 2+ b/l  Pulmonary/Chest: Effort normal and breath sounds normal. No respiratory distress. She has no wheezes. She has no rales.  Abdominal: Soft. Bowel sounds are normal.  Musculoskeletal: She exhibits no edema or tenderness.  Neurological: She is alert and oriented to person, place, and time.  Skin: She is not diaphoretic. There is pallor.    Imaging: No results found.  Labs:  CBC:  Recent Labs  05/03/14 1853 05/04/14 0518 05/05/14 0003 05/05/14 0948  WBC 9.7 7.1 7.9 6.2  HGB 9.5* 9.0* 9.2* 5.7*  HCT 27.6* 26.7* 27.6* 17.2*  PLT 276 246 286 187    COAGS:  Recent Labs  04/26/14 2314 05/04/14 0518  INR 1.16 1.04  APTT  --  30    BMP:  Recent Labs  04/29/14 0856 04/30/14 0828 05/02/14 0100 05/02/14 1442 05/04/14 0518  NA 135 140 136  --  138  K 3.6 3.6 3.2* 3.7 3.7  CL 111 110 112  --  107  CO2 --  27  GLUCOSE 69*  100* 139*  --  93  BUN 6 <5* 14  --  <5*  CALCIUM 7.6* 8.3* 7.4*  --  8.1*  CREATININE 0.51 0.57 0.58  --  0.58  GFRNONAA >90 >90 >90  --  >90  GFRAA >90 >90 >90  --  >90    LIVER FUNCTION TESTS:  Recent Labs  04/26/14 2314 04/28/14 0321 04/30/14 0828 05/04/14 0518  BILITOT 0.1* <0.1* 0.4 0.5  AST 17 11 12 13   ALT 11 9 9 11   ALKPHOS 66 52 58 59  PROT 4.5* 4.1* 5.0* 4.6*  ALBUMIN 2.4* 2.1* 2.5* 2.3*    Assessment and Plan: GI bleed S/p EGD 3/4 with large duodenal ulcer with old blood and clot, no active bleeding seen.   Patient has been stable until near syncopal episode today with drop in hgb to 5.7 (9.2) and hypotensive with  tachycardia Seen in consult on 04/27/14 for mesenteric angiogram with possible embolization if re-bleed occurs Now with active signs of bleeding, will proceed with angiogram and embolization with moderate sedation today Patient has been NPO, labs reviewed Risks and Benefits rediscussed with the patient including bleeding, vascular injury, use of contrast media or infection. All of the patient's questions were answered, patient is agreeable to proceed. Consent signed and in chart.   SignedBerneta Levins: Laymon Stockert D 05/05/2014, 11:12 AM   I spent a total of 15 Minutes in face to face in clinical consultation/evaluation, greater than 50% of which was counseling/coordinating care for GI bleed.

## 2014-05-05 NOTE — Progress Notes (Signed)
CRITICAL VALUE ALERT  Critical value received:  K+ 6.6  Date of notification:  05/05/2014  Time of notification:  1753  Critical value read back:Yes.    Nurse who received alert:  Anthoney HaradaAllison Aunya Lemler, RN  MD notified (1st page):  Dr. Dema SeverinMungal  Time of first page:  [phone]  Responding MD:  Dr. Dema SeverinMungal  Stat potassium and magnesium labs sent

## 2014-05-05 NOTE — Procedures (Signed)
Interventional Radiology Procedure Note  Procedure:  Mesenteric angiogram for presumed duodenal ulcer bleeding.   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  No bleeding identified from the pancreaticoduodenal arteries.  No empiric embolization performed.   Empiric embolization of the replaced hepatic artery to treat the artery of highest likelihood of contribution to the duodenal bulb would have decreased the flow to liver, with risk of ischemia.  Manual pressure for right common femoral hemostasis. Complications: No immediate Recommendations:  - bedrest supine for 6 hours with log-roll. - IV hydration.  - continue H&H and ICU care  Signed,  Yvone NeuJaime S. Loreta AveWagner, DO

## 2014-05-06 ENCOUNTER — Encounter (HOSPITAL_COMMUNITY): Payer: Self-pay | Admitting: Gastroenterology

## 2014-05-06 LAB — CBC
HCT: 25.1 % — ABNORMAL LOW (ref 36.0–46.0)
HCT: 26 % — ABNORMAL LOW (ref 36.0–46.0)
Hemoglobin: 8.8 g/dL — ABNORMAL LOW (ref 12.0–15.0)
Hemoglobin: 9 g/dL — ABNORMAL LOW (ref 12.0–15.0)
MCH: 29.3 pg (ref 26.0–34.0)
MCH: 29.5 pg (ref 26.0–34.0)
MCHC: 34.6 g/dL (ref 30.0–36.0)
MCHC: 35.1 g/dL (ref 30.0–36.0)
MCV: 84.2 fL (ref 78.0–100.0)
MCV: 84.7 fL (ref 78.0–100.0)
Platelets: 181 10*3/uL (ref 150–400)
Platelets: 185 10*3/uL (ref 150–400)
RBC: 2.98 MIL/uL — ABNORMAL LOW (ref 3.87–5.11)
RBC: 3.07 MIL/uL — ABNORMAL LOW (ref 3.87–5.11)
RDW: 14.9 % (ref 11.5–15.5)
RDW: 15.1 % (ref 11.5–15.5)
WBC: 11.5 10*3/uL — ABNORMAL HIGH (ref 4.0–10.5)
WBC: 9.9 10*3/uL (ref 4.0–10.5)

## 2014-05-06 LAB — BASIC METABOLIC PANEL
Anion gap: 4 — ABNORMAL LOW (ref 5–15)
BUN: 14 mg/dL (ref 6–23)
CO2: 23 mmol/L (ref 19–32)
Calcium: 7.5 mg/dL — ABNORMAL LOW (ref 8.4–10.5)
Chloride: 108 mmol/L (ref 96–112)
Creatinine, Ser: 0.48 mg/dL — ABNORMAL LOW (ref 0.50–1.10)
GFR calc Af Amer: >90 mL/min (ref >90)
GFR calc non Af Amer: >90 mL/min (ref >90)
Glucose, Bld: 85 mg/dL (ref 70–99)
Potassium: 3.6 mmol/L (ref 3.5–5.1)
Sodium: 135 mmol/L (ref 135–145)

## 2014-05-06 MED ORDER — MAGNESIUM SULFATE 2 GM/50ML IV SOLN
2.0000 g | Freq: Once | INTRAVENOUS | Status: AC
  Administered 2014-05-06: 2 g via INTRAVENOUS
  Filled 2014-05-06: qty 50

## 2014-05-06 MED ORDER — BOOST / RESOURCE BREEZE PO LIQD
1.0000 | Freq: Three times a day (TID) | ORAL | Status: DC
Start: 2014-05-06 — End: 2014-05-07
  Administered 2014-05-06 – 2014-05-07 (×3): 1 via ORAL

## 2014-05-06 NOTE — Progress Notes (Signed)
Attempted to call report X 1. 

## 2014-05-06 NOTE — Progress Notes (Signed)
Spoke with Dr[facility] Madilyn [facility]Hayes from [facility] and he said it would be okay to start pt on Clear Liquid diet and to advance diet per primary team requests.

## 2014-05-06 NOTE — Progress Notes (Signed)
Subjective: Pt sitting up in chair. No new complaints Had Visceral angio yesterday but no embolization. No active bleed/source was found. No signs of ongoing bleeding at present time. No N/V, hematemesis. Denies abd pain   Objective: Physical Exam: BP 89/39 mmHg  Pulse 93  Temp(Src) 97.7 F (36.5 C) (Oral)  Resp 19  Ht '5\' 3"'  (1.6 m)  Wt 156 lb 1.6 oz (70.806 kg)  BMI 27.66 kg/m2  SpO2 100%  LMP 04/12/2014 Abd: soft, NT Ext: (R)groin soft, no hematoma, minimal tenderness. Legs warm, excellent pedal pulses    Labs: CBC  Recent Labs  05/05/14 1600 05/06/14 0400  WBC 7.8 9.9  HGB 9.9* 9.0*  HCT 29.5* 26.0*  PLT 190 181   BMET  Recent Labs  05/04/14 0518 05/05/14 1600 05/05/14 1800  NA 138 138  --   K 3.7 6.6* 3.6  CL 107 108  --   CO2 27 24  --   GLUCOSE 93 136*  --   BUN <5* 12  --   CREATININE 0.58 0.58  --   CALCIUM 8.1* 6.9*  --    LFT  Recent Labs  05/04/14 0518  PROT 4.6*  ALBUMIN 2.3*  AST 13  ALT 11  ALKPHOS 59  BILITOT 0.5   PT/INR  Recent Labs  05/04/14 0518  LABPROT 13.7  INR 1.04     Studies/Results: Ir Angiogram Visceral Selective  05/05/2014   INDICATION: 34 year old female admitted to the hospital with upper GI bleeding and hematemesis.  She was admitted April 26, 2013. Upper endoscopy demonstrates duodenum ulcer.  She has been stable over 1 week, though developed syncopal episode and acute anemia with a hemoglobin of 5.7 on today's date. She has been referred for evaluation and treatment of the duodenum ulcer hemorrhage.  EXAM: 1. AORTIC ANGIOGRAM 2. SUPERIOR MESENTERIC ARTERIOGRAM 3. SUB SELECTION OF FOUR TERTIARY BRANCHES FROM THE SUPERIOR MESENTERIC ARTERY 4. ULTRASOUND GUIDED VASCULAR ACCESS OF THE RIGHT COMMON FEMORAL ARTERY 5. MANUAL PRESSURE FOR HEMOSTASIS.  COMPARISON:  CT abdomen pelvis, 04/27/2013, 09/22/2001  MEDICATIONS: Versed 2.0 mg IV; Fentanyl 100 mcg IV  CONTRAST:  126m OMNIPAQUE IOHEXOL 300 MG/ML  SOLN   ANESTHESIA/SEDATION: One hundred fifty minutes  FLUOROSCOPY TIME:  Forty-one minutes.  Fifty-four seconds.  2941 mGy  ACCESS: Right common femoral artery; hemostasis achieved with manual compression.  COMPLICATIONS: None immediate  TECHNIQUE: Informed written consent was obtained from the patient after a discussion of the risks, benefits and alternatives to treatment. Questions regarding the procedure were encouraged and answered. A timeout was performed prior to the initiation of the procedure.  The right groin was prepped and draped in the usual sterile fashion, and a sterile drape was applied covering the operative field. Maximum barrier sterile technique with sterile gowns and gloves were used for the procedure. A timeout was performed prior to the initiation of the procedure. Local anesthesia was provided with 1% lidocaine.  Physical exam was performed to identify the location of the right common femoral artery. Ultrasound survey was performed with images stored sent to PACs.  Once the common femoral artery was identified under ultrasound, ultrasound guidance was used to generously infiltrate the skin and subcutaneous tissues with 1% lidocaine without epinephrine. A micropuncture access was then used to puncture the right common femoral artery. With excellent blood flow returned, and micro wire was advanced through the needle observed under fluoroscopy to enter the iliac system. The needle was removed from wire a micropuncture kit was advanced over the  wire. The inner dilator and cannula were removed with a wire, and an 035 Bentson wire was advanced into the abdominal aorta under fluoroscopy the micro puncture was removed, and a 5 French sheath was advanced over the Bentson wire into the common femoral artery. The dilator was removed and the sheath was flushed.  A pigtail Flush catheter was advanced over the Bentson wire into the lower thoracic aorta. Aortic flush angiogram performed.  Pigtail catheter was then  exchanged over wire for C2 Cobra catheter which was used to select the superior mesenteric artery. Superior mesenteric artery angiogram was performed.  Subsequently, a combination of the micro catheter a micro wire were used to select various branches of superior mesenteric artery in this patient with a celiacomesenteric trunk. A standard renegade micro catheter as well as a soft synchro an a standard .016 synchro wire were used to select branches of the superior mesenteric artery. Sub selection included:  Replaced common hepatic artery.  Right hepatic artery.  New middle colicky artery.  Proximal jejunal branches.  Two base catheter exchanges were made on the Bentson wire including exchange for Mickelson catheter and exchanged for a C2 Cobra catheter for formation of a Waltman loop.  After dedicated angiogram of each of the sub selected arteries, all catheters and wires were removed.  Angiogram through the sheath was performed with the common femoral artery.  Deployment of an Exoseal device for hemostasis failed without deployment. Manual pressure was used for hemostasis of the right common femoral artery.  Patient tolerated procedure well and remained hemodynamically stable throughout. At the end of the procedure, the patient's vital signs were within normal range. Systolic brought pressure 110. Heart rate of 80-90. No syncopal events, no altered mental status, no hematemesis, and no hematochezia/melena.  Marland Kitchen  FINDINGS: Aortic angiogram:  Normal course caliber and contour of the abdominal aorta. Multiple segmental vessels fill within the lower thoracic spine and lumbar spine. No aneurysm dissection flap identified. No significant atherosclerotic changes.  Steep angled take-off of the bilateral common iliac arteries.  Single bilateral renal arteries.  No celiac artery origin.  Superior mesenteric artery patent, with configuration of a celiacomesenteric trunk.  Late phase of the aortic rind demonstrates no evidence of  bleeding.  Celiacomesenteric angiogram:  Normal caliber and contour of the origin of the trunk. Multiple jejunal branches present. Normal caliber and contour of the ileocolic artery. Multiple tortuous vessels overlie the proximal 3rd of the trunk, with evidence of a replaced common hepatic artery contributing to both right and left hepatic arteries. There is a replaced vessel representing a celiac vessel, with cross-filling at a bifurcation to the splenic artery and the hepatic arteries. Patent left gastric artery. New  Late phase of the trunk injection demonstrates no evidence of bleeding. No tumor blush or pooling of contrast. Patent portal venous system identified.  Common hepatic artery injection:  Replaced common hepatic artery with right hepatic arteries and left hepatic arteries from the replaced trunk. No evidence of tumor blush, aneurysm, dissection flap, pulling of contrast. No abnormal enhancement identified. No pseudoaneurysm identified.  No call it artery injection:  Patent middle call it artery of normal caliber. Significant cross flow from collateral vessels with no pooling of contrast, pseudoaneurysm, or tumor blush.  Proximal jejunal arcade:  Normal course caliber and contour. Normal blush of the proximal jejunal mucosa. No filling defects, or pseudoaneurysm.  No embolization was performed.  IMPRESSION: Status post diagnostic mesenteric angiogram.  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  No bleeding was identified from the replaced common hepatic artery, which would be the best candidate for supplying the pancreaticoduodenal arteries near the known proximal duodenum ulcer. Because no bleeding was identified of this variant anatomy, no empiric embolization was performed.  Signed,  Dulcy Fanny. Earleen Newport, DO  [facility]  [facility]  PLAN: The patient will require 6 hours flattened bed  with log roll only for hemostasis of the right common femoral artery after manual compression.  Agree with ICU management, including serial hemoglobin and hematocrit checks.   Electronically Signed   By: Corrie Mckusick D.O.   On: 05/05/2014 16:37   Ir Angiogram Selective Each Additional Vessel  05/05/2014   INDICATION: 34 year old female admitted to the hospital with upper GI bleeding and hematemesis.  She was admitted April 26, 2013. Upper endoscopy demonstrates duodenum ulcer.  She has been stable over 1 week, though developed syncopal episode and acute anemia with a hemoglobin of 5.7 on today's date. She has been referred for evaluation and treatment of the duodenum ulcer hemorrhage.  EXAM: 1. AORTIC ANGIOGRAM 2. SUPERIOR MESENTERIC ARTERIOGRAM 3. SUB SELECTION OF FOUR TERTIARY BRANCHES FROM THE SUPERIOR MESENTERIC ARTERY 4. ULTRASOUND GUIDED VASCULAR ACCESS OF THE RIGHT COMMON FEMORAL ARTERY 5. MANUAL PRESSURE FOR HEMOSTASIS.  COMPARISON:  CT abdomen pelvis, 04/27/2013, 09/22/2001  MEDICATIONS: Versed 2.0 mg IV; Fentanyl 100 mcg IV  CONTRAST:  130m OMNIPAQUE IOHEXOL 300 MG/ML  SOLN  ANESTHESIA/SEDATION: One hundred fifty minutes  FLUOROSCOPY TIME:  Forty-one minutes.  Fifty-four seconds.  2941 mGy  ACCESS: Right common femoral artery; hemostasis achieved with manual compression.  COMPLICATIONS: None immediate  TECHNIQUE: Informed written consent was obtained from the patient after a discussion of the risks, benefits and alternatives to treatment. Questions regarding the procedure were encouraged and answered. A timeout was performed prior to the initiation of the procedure.  The right groin was prepped and draped in the usual sterile fashion, and a sterile drape was applied covering the operative field. Maximum barrier sterile technique with sterile gowns and gloves were used for the procedure. A timeout was performed prior to the initiation of the procedure. Local anesthesia was provided with 1% lidocaine.   Physical exam was performed to identify the location of the right common femoral artery. Ultrasound survey was performed with images stored sent to PACs.  Once the common femoral artery was identified under ultrasound, ultrasound guidance was used to generously infiltrate the skin and subcutaneous tissues with 1% lidocaine without epinephrine. A micropuncture access was then used to puncture the right common femoral artery. With excellent blood flow returned, and micro wire was advanced through the needle observed under fluoroscopy to enter the iliac system. The needle was removed from wire a micropuncture kit was advanced over the wire. The inner dilator and cannula were removed with a wire, and an 035 Bentson wire was advanced into the abdominal aorta under fluoroscopy the micro puncture was removed, and a 5 French sheath was advanced over the Bentson wire into the common femoral artery. The dilator was removed and the sheath was flushed.  A pigtail Flush catheter was advanced over the Bentson wire into the lower thoracic aorta. Aortic flush angiogram performed.  Pigtail catheter was then exchanged over wire for C2 Cobra catheter which was used to select the superior mesenteric artery. Superior mesenteric artery angiogram was performed.  Subsequently, a combination of the micro catheter a micro wire were used to select various  branches of superior mesenteric artery in this patient with a celiacomesenteric trunk. A standard renegade micro catheter as well as a soft synchro an a standard .016 synchro wire were used to select branches of the superior mesenteric artery. Sub selection included:  Replaced common hepatic artery.  Right hepatic artery.  New middle colicky artery.  Proximal jejunal branches.  Two base catheter exchanges were made on the Bentson wire including exchange for Mickelson catheter and exchanged for a C2 Cobra catheter for formation of a Waltman loop.  After dedicated angiogram of each of the sub  selected arteries, all catheters and wires were removed.  Angiogram through the sheath was performed with the common femoral artery.  Deployment of an Exoseal device for hemostasis failed without deployment. Manual pressure was used for hemostasis of the right common femoral artery.  Patient tolerated procedure well and remained hemodynamically stable throughout. At the end of the procedure, the patient's vital signs were within normal range. Systolic brought pressure 110. Heart rate of 80-90. No syncopal events, no altered mental status, no hematemesis, and no hematochezia/melena.  Marland Kitchen  FINDINGS: Aortic angiogram:  Normal course caliber and contour of the abdominal aorta. Multiple segmental vessels fill within the lower thoracic spine and lumbar spine. No aneurysm dissection flap identified. No significant atherosclerotic changes.  Steep angled take-off of the bilateral common iliac arteries.  Single bilateral renal arteries.  No celiac artery origin.  Superior mesenteric artery patent, with configuration of a celiacomesenteric trunk.  Late phase of the aortic rind demonstrates no evidence of bleeding.  Celiacomesenteric angiogram:  Normal caliber and contour of the origin of the trunk. Multiple jejunal branches present. Normal caliber and contour of the ileocolic artery. Multiple tortuous vessels overlie the proximal 3rd of the trunk, with evidence of a replaced common hepatic artery contributing to both right and left hepatic arteries. There is a replaced vessel representing a celiac vessel, with cross-filling at a bifurcation to the splenic artery and the hepatic arteries. Patent left gastric artery. New  Late phase of the trunk injection demonstrates no evidence of bleeding. No tumor blush or pooling of contrast. Patent portal venous system identified.  Common hepatic artery injection:  Replaced common hepatic artery with right hepatic arteries and left hepatic arteries from the replaced trunk. No evidence of  tumor blush, aneurysm, dissection flap, pulling of contrast. No abnormal enhancement identified. No pseudoaneurysm identified.  No call it artery injection:  Patent middle call it artery of normal caliber. Significant cross flow from collateral vessels with no pooling of contrast, pseudoaneurysm, or tumor blush.  Proximal jejunal arcade:  Normal course caliber and contour. Normal blush of the proximal jejunal mucosa. No filling defects, or pseudoaneurysm.  No embolization was performed.  IMPRESSION: Status post diagnostic mesenteric angiogram.  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  No bleeding was identified from the replaced common hepatic artery, which would be the best candidate for supplying the pancreaticoduodenal arteries near the known proximal duodenum ulcer. Because no bleeding was identified of this variant anatomy, no empiric embolization was performed.  Signed,  Dulcy Fanny. Earleen Newport, DO  [facility]  [facility]  PLAN: The patient will require 6 hours flattened bed with log roll only for hemostasis of the right common femoral artery after manual compression.  Agree with ICU management, including serial hemoglobin and hematocrit checks.   Electronically Signed   By: Corrie Mckusick D.O.   On: 05/05/2014 16:37  Ir Angiogram Selective Each Additional Vessel  05/05/2014   INDICATION: 34 year old female admitted to the hospital with upper GI bleeding and hematemesis.  She was admitted April 26, 2013. Upper endoscopy demonstrates duodenum ulcer.  She has been stable over 1 week, though developed syncopal episode and acute anemia with a hemoglobin of 5.7 on today's date. She has been referred for evaluation and treatment of the duodenum ulcer hemorrhage.  EXAM: 1. AORTIC ANGIOGRAM 2. SUPERIOR MESENTERIC ARTERIOGRAM 3. SUB SELECTION OF FOUR TERTIARY BRANCHES FROM THE SUPERIOR MESENTERIC  ARTERY 4. ULTRASOUND GUIDED VASCULAR ACCESS OF THE RIGHT COMMON FEMORAL ARTERY 5. MANUAL PRESSURE FOR HEMOSTASIS.  COMPARISON:  CT abdomen pelvis, 04/27/2013, 09/22/2001  MEDICATIONS: Versed 2.0 mg IV; Fentanyl 100 mcg IV  CONTRAST:  180m OMNIPAQUE IOHEXOL 300 MG/ML  SOLN  ANESTHESIA/SEDATION: One hundred fifty minutes  FLUOROSCOPY TIME:  Forty-one minutes.  Fifty-four seconds.  2941 mGy  ACCESS: Right common femoral artery; hemostasis achieved with manual compression.  COMPLICATIONS: None immediate  TECHNIQUE: Informed written consent was obtained from the patient after a discussion of the risks, benefits and alternatives to treatment. Questions regarding the procedure were encouraged and answered. A timeout was performed prior to the initiation of the procedure.  The right groin was prepped and draped in the usual sterile fashion, and a sterile drape was applied covering the operative field. Maximum barrier sterile technique with sterile gowns and gloves were used for the procedure. A timeout was performed prior to the initiation of the procedure. Local anesthesia was provided with 1% lidocaine.  Physical exam was performed to identify the location of the right common femoral artery. Ultrasound survey was performed with images stored sent to PACs.  Once the common femoral artery was identified under ultrasound, ultrasound guidance was used to generously infiltrate the skin and subcutaneous tissues with 1% lidocaine without epinephrine. A micropuncture access was then used to puncture the right common femoral artery. With excellent blood flow returned, and micro wire was advanced through the needle observed under fluoroscopy to enter the iliac system. The needle was removed from wire a micropuncture kit was advanced over the wire. The inner dilator and cannula were removed with a wire, and an 035 Bentson wire was advanced into the abdominal aorta under fluoroscopy the micro puncture was removed, and a 5 French  sheath was advanced over the Bentson wire into the common femoral artery. The dilator was removed and the sheath was flushed.  A pigtail Flush catheter was advanced over the Bentson wire into the lower thoracic aorta. Aortic flush angiogram performed.  Pigtail catheter was then exchanged over wire for C2 Cobra catheter which was used to select the superior mesenteric artery. Superior mesenteric artery angiogram was performed.  Subsequently, a combination of the micro catheter a micro wire were used to select various branches of superior mesenteric artery in this patient with a celiacomesenteric trunk. A standard renegade micro catheter as well as a soft synchro an a standard .016 synchro wire were used to select branches of the superior mesenteric artery. Sub selection included:  Replaced common hepatic artery.  Right hepatic artery.  New middle colicky artery.  Proximal jejunal branches.  Two base catheter exchanges were made on the Bentson wire including exchange for Mickelson catheter and exchanged for a C2 Cobra catheter for formation of a Waltman loop.  After dedicated angiogram of each of the sub selected arteries, all catheters and wires were removed.  Angiogram through the sheath was performed with the common femoral artery.  Deployment of an Exoseal device for hemostasis failed without deployment. Manual pressure was used for hemostasis of the right common femoral artery.  Patient tolerated procedure well and remained hemodynamically stable throughout. At the end of the procedure, the patient's vital signs were within normal range. Systolic brought pressure 110. Heart rate of 80-90. No syncopal events, no altered mental status, no hematemesis, and no hematochezia/melena.  Marland Kitchen  FINDINGS: Aortic angiogram:  Normal course caliber and contour of the abdominal aorta. Multiple segmental vessels fill within the lower thoracic spine and lumbar spine. No aneurysm dissection flap identified. No significant  atherosclerotic changes.  Steep angled take-off of the bilateral common iliac arteries.  Single bilateral renal arteries.  No celiac artery origin.  Superior mesenteric artery patent, with configuration of a celiacomesenteric trunk.  Late phase of the aortic rind demonstrates no evidence of bleeding.  Celiacomesenteric angiogram:  Normal caliber and contour of the origin of the trunk. Multiple jejunal branches present. Normal caliber and contour of the ileocolic artery. Multiple tortuous vessels overlie the proximal 3rd of the trunk, with evidence of a replaced common hepatic artery contributing to both right and left hepatic arteries. There is a replaced vessel representing a celiac vessel, with cross-filling at a bifurcation to the splenic artery and the hepatic arteries. Patent left gastric artery. New  Late phase of the trunk injection demonstrates no evidence of bleeding. No tumor blush or pooling of contrast. Patent portal venous system identified.  Common hepatic artery injection:  Replaced common hepatic artery with right hepatic arteries and left hepatic arteries from the replaced trunk. No evidence of tumor blush, aneurysm, dissection flap, pulling of contrast. No abnormal enhancement identified. No pseudoaneurysm identified.  No call it artery injection:  Patent middle call it artery of normal caliber. Significant cross flow from collateral vessels with no pooling of contrast, pseudoaneurysm, or tumor blush.  Proximal jejunal arcade:  Normal course caliber and contour. Normal blush of the proximal jejunal mucosa. No filling defects, or pseudoaneurysm.  No embolization was performed.  IMPRESSION: Status post diagnostic mesenteric angiogram.  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  No bleeding was identified from the replaced common hepatic artery, which would be the best candidate for supplying the pancreaticoduodenal arteries near  the known proximal duodenum ulcer. Because no bleeding was identified of this variant anatomy, no empiric embolization was performed.  Signed,  Dulcy Fanny. Earleen Newport, DO  [facility]  [facility]  PLAN: The patient will require 6 hours flattened bed with log roll only for hemostasis of the right common femoral artery after manual compression.  Agree with ICU management, including serial hemoglobin and hematocrit checks.   Electronically Signed   By: Corrie Mckusick D.O.   On: 05/05/2014 16:37   Ir Angiogram Selective Each Additional Vessel  05/05/2014   INDICATION: 34 year old female admitted to the hospital with upper GI bleeding and hematemesis.  She was admitted April 26, 2013. Upper endoscopy demonstrates duodenum ulcer.  She has been stable over 1 week, though developed syncopal episode and acute anemia with a hemoglobin of 5.7 on today's date. She has been referred for evaluation and treatment of the duodenum ulcer hemorrhage.  EXAM: 1. AORTIC ANGIOGRAM 2. SUPERIOR MESENTERIC ARTERIOGRAM 3. SUB SELECTION OF FOUR TERTIARY BRANCHES FROM THE SUPERIOR MESENTERIC ARTERY 4. ULTRASOUND GUIDED VASCULAR ACCESS OF THE RIGHT COMMON FEMORAL ARTERY 5. MANUAL PRESSURE FOR HEMOSTASIS.  COMPARISON:  CT abdomen pelvis, 04/27/2013, 09/22/2001  MEDICATIONS:  Versed 2.0 mg IV; Fentanyl 100 mcg IV  CONTRAST:  167m OMNIPAQUE IOHEXOL 300 MG/ML  SOLN  ANESTHESIA/SEDATION: One hundred fifty minutes  FLUOROSCOPY TIME:  Forty-one minutes.  Fifty-four seconds.  2941 mGy  ACCESS: Right common femoral artery; hemostasis achieved with manual compression.  COMPLICATIONS: None immediate  TECHNIQUE: Informed written consent was obtained from the patient after a discussion of the risks, benefits and alternatives to treatment. Questions regarding the procedure were encouraged and answered. A timeout was performed prior to the initiation of the procedure.  The right groin was prepped and draped in the  usual sterile fashion, and a sterile drape was applied covering the operative field. Maximum barrier sterile technique with sterile gowns and gloves were used for the procedure. A timeout was performed prior to the initiation of the procedure. Local anesthesia was provided with 1% lidocaine.  Physical exam was performed to identify the location of the right common femoral artery. Ultrasound survey was performed with images stored sent to PACs.  Once the common femoral artery was identified under ultrasound, ultrasound guidance was used to generously infiltrate the skin and subcutaneous tissues with 1% lidocaine without epinephrine. A micropuncture access was then used to puncture the right common femoral artery. With excellent blood flow returned, and micro wire was advanced through the needle observed under fluoroscopy to enter the iliac system. The needle was removed from wire a micropuncture kit was advanced over the wire. The inner dilator and cannula were removed with a wire, and an 035 Bentson wire was advanced into the abdominal aorta under fluoroscopy the micro puncture was removed, and a 5 French sheath was advanced over the Bentson wire into the common femoral artery. The dilator was removed and the sheath was flushed.  A pigtail Flush catheter was advanced over the Bentson wire into the lower thoracic aorta. Aortic flush angiogram performed.  Pigtail catheter was then exchanged over wire for C2 Cobra catheter which was used to select the superior mesenteric artery. Superior mesenteric artery angiogram was performed.  Subsequently, a combination of the micro catheter a micro wire were used to select various branches of superior mesenteric artery in this patient with a celiacomesenteric trunk. A standard renegade micro catheter as well as a soft synchro an a standard .016 synchro wire were used to select branches of the superior mesenteric artery. Sub selection included:  Replaced common hepatic artery.   Right hepatic artery.  New middle colicky artery.  Proximal jejunal branches.  Two base catheter exchanges were made on the Bentson wire including exchange for Mickelson catheter and exchanged for a C2 Cobra catheter for formation of a Waltman loop.  After dedicated angiogram of each of the sub selected arteries, all catheters and wires were removed.  Angiogram through the sheath was performed with the common femoral artery.  Deployment of an Exoseal device for hemostasis failed without deployment. Manual pressure was used for hemostasis of the right common femoral artery.  Patient tolerated procedure well and remained hemodynamically stable throughout. At the end of the procedure, the patient's vital signs were within normal range. Systolic brought pressure 110. Heart rate of 80-90. No syncopal events, no altered mental status, no hematemesis, and no hematochezia/melena.  .Marland Kitchen FINDINGS: Aortic angiogram:  Normal course caliber and contour of the abdominal aorta. Multiple segmental vessels fill within the lower thoracic spine and lumbar spine. No aneurysm dissection flap identified. No significant atherosclerotic changes.  Steep angled take-off of the bilateral common iliac arteries.  Single bilateral renal  arteries.  No celiac artery origin.  Superior mesenteric artery patent, with configuration of a celiacomesenteric trunk.  Late phase of the aortic rind demonstrates no evidence of bleeding.  Celiacomesenteric angiogram:  Normal caliber and contour of the origin of the trunk. Multiple jejunal branches present. Normal caliber and contour of the ileocolic artery. Multiple tortuous vessels overlie the proximal 3rd of the trunk, with evidence of a replaced common hepatic artery contributing to both right and left hepatic arteries. There is a replaced vessel representing a celiac vessel, with cross-filling at a bifurcation to the splenic artery and the hepatic arteries. Patent left gastric artery. New  Late phase of the  trunk injection demonstrates no evidence of bleeding. No tumor blush or pooling of contrast. Patent portal venous system identified.  Common hepatic artery injection:  Replaced common hepatic artery with right hepatic arteries and left hepatic arteries from the replaced trunk. No evidence of tumor blush, aneurysm, dissection flap, pulling of contrast. No abnormal enhancement identified. No pseudoaneurysm identified.  No call it artery injection:  Patent middle call it artery of normal caliber. Significant cross flow from collateral vessels with no pooling of contrast, pseudoaneurysm, or tumor blush.  Proximal jejunal arcade:  Normal course caliber and contour. Normal blush of the proximal jejunal mucosa. No filling defects, or pseudoaneurysm.  No embolization was performed.  IMPRESSION: Status post diagnostic mesenteric angiogram.  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  No bleeding was identified from the replaced common hepatic artery, which would be the best candidate for supplying the pancreaticoduodenal arteries near the known proximal duodenum ulcer. Because no bleeding was identified of this variant anatomy, no empiric embolization was performed.  Signed,  Dulcy Fanny. Earleen Newport, DO  [facility]  [facility]  PLAN: The patient will require 6 hours flattened bed with log roll only for hemostasis of the right common femoral artery after manual compression.  Agree with ICU management, including serial hemoglobin and hematocrit checks.   Electronically Signed   By: Corrie Mckusick D.O.   On: 05/05/2014 16:37   Ir Angiogram Selective Each Additional Vessel  05/05/2014   INDICATION: 34 year old female admitted to the hospital with upper GI bleeding and hematemesis.  She was admitted April 26, 2013. Upper endoscopy demonstrates duodenum ulcer.  She has been stable over 1 week, though developed  syncopal episode and acute anemia with a hemoglobin of 5.7 on today's date. She has been referred for evaluation and treatment of the duodenum ulcer hemorrhage.  EXAM: 1. AORTIC ANGIOGRAM 2. SUPERIOR MESENTERIC ARTERIOGRAM 3. SUB SELECTION OF FOUR TERTIARY BRANCHES FROM THE SUPERIOR MESENTERIC ARTERY 4. ULTRASOUND GUIDED VASCULAR ACCESS OF THE RIGHT COMMON FEMORAL ARTERY 5. MANUAL PRESSURE FOR HEMOSTASIS.  COMPARISON:  CT abdomen pelvis, 04/27/2013, 09/22/2001  MEDICATIONS: Versed 2.0 mg IV; Fentanyl 100 mcg IV  CONTRAST:  113m OMNIPAQUE IOHEXOL 300 MG/ML  SOLN  ANESTHESIA/SEDATION: One hundred fifty minutes  FLUOROSCOPY TIME:  Forty-one minutes.  Fifty-four seconds.  2941 mGy  ACCESS: Right common femoral artery; hemostasis achieved with manual compression.  COMPLICATIONS: None immediate  TECHNIQUE: Informed written consent was obtained from the patient after a discussion of the risks, benefits and alternatives to treatment. Questions regarding the procedure were encouraged and answered. A timeout was performed prior to the initiation of the procedure.  The right groin was prepped and draped in the usual sterile fashion, and a sterile drape was applied covering the operative field. Maximum barrier sterile  technique with sterile gowns and gloves were used for the procedure. A timeout was performed prior to the initiation of the procedure. Local anesthesia was provided with 1% lidocaine.  Physical exam was performed to identify the location of the right common femoral artery. Ultrasound survey was performed with images stored sent to PACs.  Once the common femoral artery was identified under ultrasound, ultrasound guidance was used to generously infiltrate the skin and subcutaneous tissues with 1% lidocaine without epinephrine. A micropuncture access was then used to puncture the right common femoral artery. With excellent blood flow returned, and micro wire was advanced through the needle observed under fluoroscopy  to enter the iliac system. The needle was removed from wire a micropuncture kit was advanced over the wire. The inner dilator and cannula were removed with a wire, and an 035 Bentson wire was advanced into the abdominal aorta under fluoroscopy the micro puncture was removed, and a 5 French sheath was advanced over the Bentson wire into the common femoral artery. The dilator was removed and the sheath was flushed.  A pigtail Flush catheter was advanced over the Bentson wire into the lower thoracic aorta. Aortic flush angiogram performed.  Pigtail catheter was then exchanged over wire for C2 Cobra catheter which was used to select the superior mesenteric artery. Superior mesenteric artery angiogram was performed.  Subsequently, a combination of the micro catheter a micro wire were used to select various branches of superior mesenteric artery in this patient with a celiacomesenteric trunk. A standard renegade micro catheter as well as a soft synchro an a standard .016 synchro wire were used to select branches of the superior mesenteric artery. Sub selection included:  Replaced common hepatic artery.  Right hepatic artery.  New middle colicky artery.  Proximal jejunal branches.  Two base catheter exchanges were made on the Bentson wire including exchange for Mickelson catheter and exchanged for a C2 Cobra catheter for formation of a Waltman loop.  After dedicated angiogram of each of the sub selected arteries, all catheters and wires were removed.  Angiogram through the sheath was performed with the common femoral artery.  Deployment of an Exoseal device for hemostasis failed without deployment. Manual pressure was used for hemostasis of the right common femoral artery.  Patient tolerated procedure well and remained hemodynamically stable throughout. At the end of the procedure, the patient's vital signs were within normal range. Systolic brought pressure 110. Heart rate of 80-90. No syncopal events, no altered mental  status, no hematemesis, and no hematochezia/melena.  Marland Kitchen  FINDINGS: Aortic angiogram:  Normal course caliber and contour of the abdominal aorta. Multiple segmental vessels fill within the lower thoracic spine and lumbar spine. No aneurysm dissection flap identified. No significant atherosclerotic changes.  Steep angled take-off of the bilateral common iliac arteries.  Single bilateral renal arteries.  No celiac artery origin.  Superior mesenteric artery patent, with configuration of a celiacomesenteric trunk.  Late phase of the aortic rind demonstrates no evidence of bleeding.  Celiacomesenteric angiogram:  Normal caliber and contour of the origin of the trunk. Multiple jejunal branches present. Normal caliber and contour of the ileocolic artery. Multiple tortuous vessels overlie the proximal 3rd of the trunk, with evidence of a replaced common hepatic artery contributing to both right and left hepatic arteries. There is a replaced vessel representing a celiac vessel, with cross-filling at a bifurcation to the splenic artery and the hepatic arteries. Patent left gastric artery. New  Late phase of the trunk injection demonstrates no  evidence of bleeding. No tumor blush or pooling of contrast. Patent portal venous system identified.  Common hepatic artery injection:  Replaced common hepatic artery with right hepatic arteries and left hepatic arteries from the replaced trunk. No evidence of tumor blush, aneurysm, dissection flap, pulling of contrast. No abnormal enhancement identified. No pseudoaneurysm identified.  No call it artery injection:  Patent middle call it artery of normal caliber. Significant cross flow from collateral vessels with no pooling of contrast, pseudoaneurysm, or tumor blush.  Proximal jejunal arcade:  Normal course caliber and contour. Normal blush of the proximal jejunal mucosa. No filling defects, or pseudoaneurysm.  No embolization was performed.  IMPRESSION: Status post diagnostic mesenteric  angiogram.  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  No bleeding was identified from the replaced common hepatic artery, which would be the best candidate for supplying the pancreaticoduodenal arteries near the known proximal duodenum ulcer. Because no bleeding was identified of this variant anatomy, no empiric embolization was performed.  Signed,  Dulcy Fanny. Earleen Newport, DO  [facility]  [facility]  PLAN: The patient will require 6 hours flattened bed with log roll only for hemostasis of the right common femoral artery after manual compression.  Agree with ICU management, including serial hemoglobin and hematocrit checks.   Electronically Signed   By: Corrie Mckusick D.O.   On: 05/05/2014 16:37   Ir US Guide Vasc Access Right  05/05/2014   INDICATION: 34 year old female admitted to the hospital with upper GI bleeding and hematemesis.  She was admitted April 26, 2013. Upper endoscopy demonstrates duodenum ulcer.  She has been stable over 1 week, though developed syncopal episode and acute anemia with a hemoglobin of 5.7 on today's date. She has been referred for evaluation and treatment of the duodenum ulcer hemorrhage.  EXAM: 1. AORTIC ANGIOGRAM 2. SUPERIOR MESENTERIC ARTERIOGRAM 3. SUB SELECTION OF FOUR TERTIARY BRANCHES FROM THE SUPERIOR MESENTERIC ARTERY 4. ULTRASOUND GUIDED VASCULAR ACCESS OF THE RIGHT COMMON FEMORAL ARTERY 5. MANUAL PRESSURE FOR HEMOSTASIS.  COMPARISON:  CT abdomen pelvis, 04/27/2013, 09/22/2001  MEDICATIONS: Versed 2.0 mg IV; Fentanyl 100 mcg IV  CONTRAST:  174m OMNIPAQUE IOHEXOL 300 MG/ML  SOLN  ANESTHESIA/SEDATION: One hundred fifty minutes  FLUOROSCOPY TIME:  Forty-one minutes.  Fifty-four seconds.  2941 mGy  ACCESS: Right common femoral artery; hemostasis achieved with manual compression.  COMPLICATIONS: None immediate  TECHNIQUE: Informed written consent was obtained  from the patient after a discussion of the risks, benefits and alternatives to treatment. Questions regarding the procedure were encouraged and answered. A timeout was performed prior to the initiation of the procedure.  The right groin was prepped and draped in the usual sterile fashion, and a sterile drape was applied covering the operative field. Maximum barrier sterile technique with sterile gowns and gloves were used for the procedure. A timeout was performed prior to the initiation of the procedure. Local anesthesia was provided with 1% lidocaine.  Physical exam was performed to identify the location of the right common femoral artery. Ultrasound survey was performed with images stored sent to PACs.  Once the common femoral artery was identified under ultrasound, ultrasound guidance was used to generously infiltrate the skin and subcutaneous tissues with 1% lidocaine without epinephrine. A micropuncture access was then used to puncture the right common femoral artery. With excellent blood flow returned, and micro wire was advanced through the needle observed under fluoroscopy to enter the iliac system. The needle  was removed from wire a micropuncture kit was advanced over the wire. The inner dilator and cannula were removed with a wire, and an 035 Bentson wire was advanced into the abdominal aorta under fluoroscopy the micro puncture was removed, and a 5 French sheath was advanced over the Bentson wire into the common femoral artery. The dilator was removed and the sheath was flushed.  A pigtail Flush catheter was advanced over the Bentson wire into the lower thoracic aorta. Aortic flush angiogram performed.  Pigtail catheter was then exchanged over wire for C2 Cobra catheter which was used to select the superior mesenteric artery. Superior mesenteric artery angiogram was performed.  Subsequently, a combination of the micro catheter a micro wire were used to select various branches of superior mesenteric  artery in this patient with a celiacomesenteric trunk. A standard renegade micro catheter as well as a soft synchro an a standard .016 synchro wire were used to select branches of the superior mesenteric artery. Sub selection included:  Replaced common hepatic artery.  Right hepatic artery.  New middle colicky artery.  Proximal jejunal branches.  Two base catheter exchanges were made on the Bentson wire including exchange for Mickelson catheter and exchanged for a C2 Cobra catheter for formation of a Waltman loop.  After dedicated angiogram of each of the sub selected arteries, all catheters and wires were removed.  Angiogram through the sheath was performed with the common femoral artery.  Deployment of an Exoseal device for hemostasis failed without deployment. Manual pressure was used for hemostasis of the right common femoral artery.  Patient tolerated procedure well and remained hemodynamically stable throughout. At the end of the procedure, the patient's vital signs were within normal range. Systolic brought pressure 110. Heart rate of 80-90. No syncopal events, no altered mental status, no hematemesis, and no hematochezia/melena.  Marland Kitchen  FINDINGS: Aortic angiogram:  Normal course caliber and contour of the abdominal aorta. Multiple segmental vessels fill within the lower thoracic spine and lumbar spine. No aneurysm dissection flap identified. No significant atherosclerotic changes.  Steep angled take-off of the bilateral common iliac arteries.  Single bilateral renal arteries.  No celiac artery origin.  Superior mesenteric artery patent, with configuration of a celiacomesenteric trunk.  Late phase of the aortic rind demonstrates no evidence of bleeding.  Celiacomesenteric angiogram:  Normal caliber and contour of the origin of the trunk. Multiple jejunal branches present. Normal caliber and contour of the ileocolic artery. Multiple tortuous vessels overlie the proximal 3rd of the trunk, with evidence of a  replaced common hepatic artery contributing to both right and left hepatic arteries. There is a replaced vessel representing a celiac vessel, with cross-filling at a bifurcation to the splenic artery and the hepatic arteries. Patent left gastric artery. New  Late phase of the trunk injection demonstrates no evidence of bleeding. No tumor blush or pooling of contrast. Patent portal venous system identified.  Common hepatic artery injection:  Replaced common hepatic artery with right hepatic arteries and left hepatic arteries from the replaced trunk. No evidence of tumor blush, aneurysm, dissection flap, pulling of contrast. No abnormal enhancement identified. No pseudoaneurysm identified.  No call it artery injection:  Patent middle call it artery of normal caliber. Significant cross flow from collateral vessels with no pooling of contrast, pseudoaneurysm, or tumor blush.  Proximal jejunal arcade:  Normal course caliber and contour. Normal blush of the proximal jejunal mucosa. No filling defects, or pseudoaneurysm.  No embolization was performed.  IMPRESSION: Status post diagnostic  mesenteric angiogram.  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  No bleeding was identified from the replaced common hepatic artery, which would be the best candidate for supplying the pancreaticoduodenal arteries near the known proximal duodenum ulcer. Because no bleeding was identified of this variant anatomy, no empiric embolization was performed.  Signed,  Dulcy Fanny. Earleen Newport, DO  [facility]  [facility]  PLAN: The patient will require 6 hours flattened bed with log roll only for hemostasis of the right common femoral artery after manual compression.  Agree with ICU management, including serial hemoglobin and hematocrit checks.   Electronically Signed   By: Corrie Mckusick D.O.   On: 05/05/2014 16:37     Assessment/Plan: Upper GI bleed secondary to duodenal ulcer S/p angiogram, no active bleed, embo not performed.  Hgb 9.0, with a 0.9g/dl drop over 12 hours. Hemodynamically stable Would not do tagged RBC scan unless signs of recurrent bleeding. Can start clears if ok with GI.     LOS: 9 days    Ascencion Dike PA-C 05/06/2014 9:53 AM

## 2014-05-06 NOTE — Progress Notes (Signed)
NUTRITION FOLLOW UP  INTERVENTION: Provide Resource Breeze po TID, each supplement provides 250 kcal and 9 grams of protein.  Encourage adequate PO intake.  NUTRITION DIAGNOSIS: Inadequate oral intake related to GI bleeding as evidenced by decreased PO intake; ongoing  Goal: Pt to meet >/= 90% of estimated energy needs; not met  Monitor:  PO intake, weight trends, labs, I/O's  34 y.o. female  Admitting Dx: Syncope  ASSESSMENT: 34 y/o female admitted 2/26 after syncopal episode. Presented with major upper GI bleed likely in setting of NSAID/ETOH usage. Massive cratered duodenal bulb ulcer noted on EGD.   Per MD note, visceral angio done yesterday, no active bleed/source found. Pt has been advanced to a clear liquid diet. Pt states hunger and requested for a [employer]. RD brought one to pt bedside. RD to order Resource Breeze to aid in caloric and protein needs. Pt was encouraged to consume her food at meals.   Labs: Low calcium and creatinine.  Height: Ht Readings from Last 1 Encounters:  05/05/14 '5\' 3"'  (1.6 m)    Weight: Wt Readings from Last 1 Encounters:  05/06/14 156 lb 1.6 oz (70.806 kg)    BMI:  Body mass index is 27.66 kg/(m^2). overweight  Re-Estimated Nutritional Needs: Kcal: 1800-2000 Protein:80-95 grams Fluid: 1.8 - 2 L/day  Skin: Incision on right groin, non-pitting UE edema  Diet Order: Diet clear liquid    Intake/Output Summary (Last 24 hours) at 05/06/14 1348 Last data filed at 05/06/14 1232  Gross per 24 hour  Intake 4352.5 ml  Output   2125 ml  Net 2227.5 ml    Last BM: 3/6  Labs:   Recent Labs Lab 04/30/14 0828  05/02/14 1442 05/04/14 0518 05/05/14 1600 05/05/14 1800 05/06/14 1010  NA 140  < >  --  138 138  --  135  K 3.6  < > 3.7 3.7 6.6* 3.6 3.6  CL 110  < >  --  107 108  --  108  CO2 27  < >  --  27 24  --  23  BUN <5*  < >  --  <5* 12  --  14  CREATININE 0.57  < >  --  0.58 0.58  --  0.48*  CALCIUM 8.3*  < >  --   8.1* 6.9*  --  7.5*  MG 2.0  --  1.6  --   --  1.8  --   GLUCOSE 100*  < >  --  93 136*  --  85  < > = values in this interval not displayed.  CBG (last 3)  No results for input(s): GLUCAP in the last 72 hours.  Scheduled Meds: . sodium chloride   Intravenous Once  . fluticasone  2 spray Each Nare Daily  . levothyroxine  25 mcg Intravenous Daily  . sodium chloride  1 spray Each Nare BID  . sodium chloride  10-40 mL Intracatheter Q12H    Continuous Infusions: . sodium chloride 20 mL/hr at 05/06/14 1000  . pantoprozole (PROTONIX) infusion 8 mg/hr (05/06/14 1000)    Past Medical History  Diagnosis Date  . Renal disorder   . Interstitial cystitis   . Thyroid disease   . Hypothyroidism   . Renal stones   . Back pain     Past Surgical History  Procedure Laterality Date  . Esophagogastroduodenoscopy N/A 04/27/2014    Procedure: ESOPHAGOGASTRODUODENOSCOPY (EGD);  Surgeon: Lear Ng, MD;  Location: [facility] ENDOSCOPY;  Service: Endoscopy;  Laterality: N/A;  . Esophagogastroduodenoscopy (egd) with propofol N/A 05/03/2014    Procedure: ESOPHAGOGASTRODUODENOSCOPY (EGD) WITH PROPOFOL;  Surgeon: Lear Ng, MD;  Location: [place];  Service: Endoscopy;  Laterality: N/A;    Kallie Locks, MS, RD, LDN Pager # [phone] After hours/ weekend pager # [phone]

## 2014-05-06 NOTE — Progress Notes (Signed)
Spent time with patient at the bedside and spoke to her father Rosanne AshingJim over the phone to provide support and answer questions.  Patient and Father had concerns that the GI Bleed may happen again and wanted to do what they could to find a way to prevent bleeding from ever occuring again.  Her father Rosanne AshingJim reported [facility] (he did not get the name and number of anyone) and they informed him he would need to get a referral from a GI or primary doctor and that they would be happy to assume care for his daughter.    After reviewing notes from today and patients stable labs along with orders to transfer to telemetry unit I made contact with Dr. Molli KnockYacoub to make him aware of patient request.  Dr. Molli KnockYacoub made me aware that this would not be a medical necessity transfer and instead would be at the patient request.  He reported he would be happy to assist the patient request and asked me to have they provide him with the name and number of an accepting physician.    I made patient aware of this and continued to offer support.  She agreed with plan to sleep on this decision for the night and follow up with the primary team in the morning.   Jacqulyn Canehristopher Scott Janney Priego RN, BSN, CCRN

## 2014-05-06 NOTE — Progress Notes (Signed)
PULMONARY / CRITICAL CARE MEDICINE   Name: Kimberly Weiss MRN: [id number] DOB: 1980/07/20    ADMISSION DATE:  04/26/2014  PRIMARY SERVICE: PCCM  CHIEF COMPLAINT:  Syncope, hematemesis  BRIEF PATIENT DESCRIPTION: 34 y/o woman admitted 2/26 after syncopal episode.   Work up concerning for UGI bleed likely due to NSAID usage. PCCM consulted for ICU admission 2/26 .  Found to have large UGIB d/t duodenal ulcer on EGD  .Initially required 5 u PRBC , tx w/ PPI stable to [facility] on 3/1 . Reconsulted 3/6 for  Hypotension/GI bleed. Pt transferred to ICU . IR eval for possible embolization .   SIGNIFICANT EVENTS / STUDIES:  2/26  Admit with syncope prior to presentation, concern for UGIB.  Hgb 8.2 from 15 2/28  No further bleeding, pt seeking pain meds, getting out of bed without assistance 2/28 Massive cratered duodenal bulb ulcer noted on EGD 3/6 Near syncopal episode w/ black stool , sharp drop in hbg and hypotension  3/7 Hg stable, transfer out of the ICU to tele.  SUBJECTIVE:  Near syncopal episode w/ black stool , sharp drop in hbg and hypotension  PCCM reconsulted -pt to go to ICU  Pt alert and talking in bed, tearful  Black stool this am . B/p responding to IVF challenge,  For 2 u PRBC IR eval for embolization this am   VITAL SIGNS: Temp:  [97.7 F (36.5 C)-98.8 F (37.1 C)] 97.7 F (36.5 C) (03/07 0746) Pulse Rate:  [73-129] 93 (03/07 0700) Resp:  [10-21] 19 (03/07 0700) BP: (62-118)/(39-69) 89/39 mmHg (03/07 0600) SpO2:  [97 %-100 %] 100 % (03/07 0700) Weight:  [70.4 kg (155 lb 3.3 oz)-70.806 kg (156 lb 1.6 oz)] 70.806 kg (156 lb 1.6 oz) (03/07 0500)   INTAKE / OUTPUT: Intake/Output      03/06 0701 - 03/07 0700 03/07 0701 - 03/08 0700   I.V. (mL/kg) 3495.8 (49.4)    Blood 761.7    IV Piggyback 2000    Total Intake(mL/kg) 6257.5 (88.4)    Urine (mL/kg/hr) 1526 (0.9)    Stool 1 (0)    Total Output 1527     Net +4730.5          Urine Occurrence 2 x    Stool Occurrence 1  x     PHYSICAL EXAMINATION: General: Alert in bed. Neuro:  CN grossly intact, mentating well. HEENT:  MM pink/moist, good dentition. Neck: No JVD. Cardiovascular:  s1s2 rrr no m/r/g. Lungs:  CTAB. Abdomen:  Soft, hyperactive bowel sounds, gen tender. Musculoskeletal:  No deformities. Skin:  No rashes.  LABS:  CBC  Recent Labs Lab 05/05/14 0948 05/05/14 1600 05/06/14 0400  WBC 6.2 7.8 9.9  HGB 5.7* 9.9* 9.0*  HCT 17.2* 29.5* 26.0*  PLT 187 190 181   Coag's  Recent Labs Lab 05/04/14 0518  APTT 30  INR 1.04   BMET  Recent Labs Lab 05/02/14 0100  05/04/14 0518 05/05/14 1600 05/05/14 1800  NA 136  --  138 138  --   K 3.2*  < > 3.7 6.6* 3.6  CL 112  --  107 108  --   CO2 21  --  27 24  --   BUN 14  --  <5* 12  --   CREATININE 0.58  --  0.58 0.58  --   GLUCOSE 139*  --  93 136*  --   < > = values in this interval not displayed. Electrolytes  Recent Labs Lab 04/30/14  2751 05/02/14 0100 05/02/14 1442 05/04/14 0518 05/05/14 1600 05/05/14 1800  CALCIUM 8.3* 7.4*  --  8.1* 6.9*  --   MG 2.0  --  1.6  --   --  1.8   Sepsis Markers  Recent Labs Lab 05/05/14 1211  LATICACIDVEN 0.94   Liver Enzymes  Recent Labs Lab 04/30/14 0828 05/04/14 0518  AST 12 13  ALT 9 11  ALKPHOS 58 59  BILITOT 0.4 0.5  ALBUMIN 2.5* 2.3*   Glucose No results for input(s): GLUCAP in the last 168 hours.  Imaging Ir Angiogram Visceral Selective  05/05/2014   INDICATION: 34 year old female admitted to the hospital with upper GI bleeding and hematemesis.  She was admitted April 26, 2013. Upper endoscopy demonstrates duodenum ulcer.  She has been stable over 1 week, though developed syncopal episode and acute anemia with a hemoglobin of 5.7 on today's date. She has been referred for evaluation and treatment of the duodenum ulcer hemorrhage.  EXAM: 1. AORTIC ANGIOGRAM 2. SUPERIOR MESENTERIC ARTERIOGRAM 3. SUB SELECTION OF FOUR TERTIARY BRANCHES FROM THE SUPERIOR MESENTERIC  ARTERY 4. ULTRASOUND GUIDED VASCULAR ACCESS OF THE RIGHT COMMON FEMORAL ARTERY 5. MANUAL PRESSURE FOR HEMOSTASIS.  COMPARISON:  CT abdomen pelvis, 04/27/2013, 09/22/2001  MEDICATIONS: Versed 2.0 mg IV; Fentanyl 100 mcg IV  CONTRAST:  171m OMNIPAQUE IOHEXOL 300 MG/ML  SOLN  ANESTHESIA/SEDATION: One hundred fifty minutes  FLUOROSCOPY TIME:  Forty-one minutes.  Fifty-four seconds.  2941 mGy  ACCESS: Right common femoral artery; hemostasis achieved with manual compression.  COMPLICATIONS: None immediate  TECHNIQUE: Informed written consent was obtained from the patient after a discussion of the risks, benefits and alternatives to treatment. Questions regarding the procedure were encouraged and answered. A timeout was performed prior to the initiation of the procedure.  The right groin was prepped and draped in the usual sterile fashion, and a sterile drape was applied covering the operative field. Maximum barrier sterile technique with sterile gowns and gloves were used for the procedure. A timeout was performed prior to the initiation of the procedure. Local anesthesia was provided with 1% lidocaine.  Physical exam was performed to identify the location of the right common femoral artery. Ultrasound survey was performed with images stored sent to PACs.  Once the common femoral artery was identified under ultrasound, ultrasound guidance was used to generously infiltrate the skin and subcutaneous tissues with 1% lidocaine without epinephrine. A micropuncture access was then used to puncture the right common femoral artery. With excellent blood flow returned, and micro wire was advanced through the needle observed under fluoroscopy to enter the iliac system. The needle was removed from wire a micropuncture kit was advanced over the wire. The inner dilator and cannula were removed with a wire, and an 035 Bentson wire was advanced into the abdominal aorta under fluoroscopy the micro puncture was removed, and a 5 French  sheath was advanced over the Bentson wire into the common femoral artery. The dilator was removed and the sheath was flushed.  A pigtail Flush catheter was advanced over the Bentson wire into the lower thoracic aorta. Aortic flush angiogram performed.  Pigtail catheter was then exchanged over wire for C2 Cobra catheter which was used to select the superior mesenteric artery. Superior mesenteric artery angiogram was performed.  Subsequently, a combination of the micro catheter a micro wire were used to select various branches of superior mesenteric artery in this patient with a celiacomesenteric trunk. A standard renegade micro catheter as well as a soft synchro  an a standard .016 synchro wire were used to select branches of the superior mesenteric artery. Sub selection included:  Replaced common hepatic artery.  Right hepatic artery.  New middle colicky artery.  Proximal jejunal branches.  Two base catheter exchanges were made on the Bentson wire including exchange for Mickelson catheter and exchanged for a C2 Cobra catheter for formation of a Waltman loop.  After dedicated angiogram of each of the sub selected arteries, all catheters and wires were removed.  Angiogram through the sheath was performed with the common femoral artery.  Deployment of an Exoseal device for hemostasis failed without deployment. Manual pressure was used for hemostasis of the right common femoral artery.  Patient tolerated procedure well and remained hemodynamically stable throughout. At the end of the procedure, the patient's vital signs were within normal range. Systolic brought pressure 110. Heart rate of 80-90. No syncopal events, no altered mental status, no hematemesis, and no hematochezia/melena.  Marland Kitchen  FINDINGS: Aortic angiogram:  Normal course caliber and contour of the abdominal aorta. Multiple segmental vessels fill within the lower thoracic spine and lumbar spine. No aneurysm dissection flap identified. No significant  atherosclerotic changes.  Steep angled take-off of the bilateral common iliac arteries.  Single bilateral renal arteries.  No celiac artery origin.  Superior mesenteric artery patent, with configuration of a celiacomesenteric trunk.  Late phase of the aortic rind demonstrates no evidence of bleeding.  Celiacomesenteric angiogram:  Normal caliber and contour of the origin of the trunk. Multiple jejunal branches present. Normal caliber and contour of the ileocolic artery. Multiple tortuous vessels overlie the proximal 3rd of the trunk, with evidence of a replaced common hepatic artery contributing to both right and left hepatic arteries. There is a replaced vessel representing a celiac vessel, with cross-filling at a bifurcation to the splenic artery and the hepatic arteries. Patent left gastric artery. New  Late phase of the trunk injection demonstrates no evidence of bleeding. No tumor blush or pooling of contrast. Patent portal venous system identified.  Common hepatic artery injection:  Replaced common hepatic artery with right hepatic arteries and left hepatic arteries from the replaced trunk. No evidence of tumor blush, aneurysm, dissection flap, pulling of contrast. No abnormal enhancement identified. No pseudoaneurysm identified.  No call it artery injection:  Patent middle call it artery of normal caliber. Significant cross flow from collateral vessels with no pooling of contrast, pseudoaneurysm, or tumor blush.  Proximal jejunal arcade:  Normal course caliber and contour. Normal blush of the proximal jejunal mucosa. No filling defects, or pseudoaneurysm.  No embolization was performed.  IMPRESSION: Status post diagnostic mesenteric angiogram.  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  No bleeding was identified from the replaced common hepatic artery, which would be the best candidate for supplying the pancreaticoduodenal arteries near  the known proximal duodenum ulcer. Because no bleeding was identified of this variant anatomy, no empiric embolization was performed.  Signed,  Dulcy Fanny. Earleen Newport, DO  [facility]  [facility]  PLAN: The patient will require 6 hours flattened bed with log roll only for hemostasis of the right common femoral artery after manual compression.  Agree with ICU management, including serial hemoglobin and hematocrit checks.   Electronically Signed   By: Corrie Mckusick D.O.   On: 05/05/2014 16:37   Ir Angiogram Selective Each Additional Vessel  05/05/2014   INDICATION: 34 year old female admitted to the hospital with upper GI bleeding and  hematemesis.  She was admitted April 26, 2013. Upper endoscopy demonstrates duodenum ulcer.  She has been stable over 1 week, though developed syncopal episode and acute anemia with a hemoglobin of 5.7 on today's date. She has been referred for evaluation and treatment of the duodenum ulcer hemorrhage.  EXAM: 1. AORTIC ANGIOGRAM 2. SUPERIOR MESENTERIC ARTERIOGRAM 3. SUB SELECTION OF FOUR TERTIARY BRANCHES FROM THE SUPERIOR MESENTERIC ARTERY 4. ULTRASOUND GUIDED VASCULAR ACCESS OF THE RIGHT COMMON FEMORAL ARTERY 5. MANUAL PRESSURE FOR HEMOSTASIS.  COMPARISON:  CT abdomen pelvis, 04/27/2013, 09/22/2001  MEDICATIONS: Versed 2.0 mg IV; Fentanyl 100 mcg IV  CONTRAST:  143m OMNIPAQUE IOHEXOL 300 MG/ML  SOLN  ANESTHESIA/SEDATION: One hundred fifty minutes  FLUOROSCOPY TIME:  Forty-one minutes.  Fifty-four seconds.  2941 mGy  ACCESS: Right common femoral artery; hemostasis achieved with manual compression.  COMPLICATIONS: None immediate  TECHNIQUE: Informed written consent was obtained from the patient after a discussion of the risks, benefits and alternatives to treatment. Questions regarding the procedure were encouraged and answered. A timeout was performed prior to the initiation of the procedure.  The right groin was prepped and draped in the  usual sterile fashion, and a sterile drape was applied covering the operative field. Maximum barrier sterile technique with sterile gowns and gloves were used for the procedure. A timeout was performed prior to the initiation of the procedure. Local anesthesia was provided with 1% lidocaine.  Physical exam was performed to identify the location of the right common femoral artery. Ultrasound survey was performed with images stored sent to PACs.  Once the common femoral artery was identified under ultrasound, ultrasound guidance was used to generously infiltrate the skin and subcutaneous tissues with 1% lidocaine without epinephrine. A micropuncture access was then used to puncture the right common femoral artery. With excellent blood flow returned, and micro wire was advanced through the needle observed under fluoroscopy to enter the iliac system. The needle was removed from wire a micropuncture kit was advanced over the wire. The inner dilator and cannula were removed with a wire, and an 035 Bentson wire was advanced into the abdominal aorta under fluoroscopy the micro puncture was removed, and a 5 French sheath was advanced over the Bentson wire into the common femoral artery. The dilator was removed and the sheath was flushed.  A pigtail Flush catheter was advanced over the Bentson wire into the lower thoracic aorta. Aortic flush angiogram performed.  Pigtail catheter was then exchanged over wire for C2 Cobra catheter which was used to select the superior mesenteric artery. Superior mesenteric artery angiogram was performed.  Subsequently, a combination of the micro catheter a micro wire were used to select various branches of superior mesenteric artery in this patient with a celiacomesenteric trunk. A standard renegade micro catheter as well as a soft synchro an a standard .016 synchro wire were used to select branches of the superior mesenteric artery. Sub selection included:  Replaced common hepatic artery.   Right hepatic artery.  New middle colicky artery.  Proximal jejunal branches.  Two base catheter exchanges were made on the Bentson wire including exchange for Mickelson catheter and exchanged for a C2 Cobra catheter for formation of a Waltman loop.  After dedicated angiogram of each of the sub selected arteries, all catheters and wires were removed.  Angiogram through the sheath was performed with the common femoral artery.  Deployment of an Exoseal device for hemostasis failed without deployment. Manual pressure was used for hemostasis of the right common femoral  artery.  Patient tolerated procedure well and remained hemodynamically stable throughout. At the end of the procedure, the patient's vital signs were within normal range. Systolic brought pressure 110. Heart rate of 80-90. No syncopal events, no altered mental status, no hematemesis, and no hematochezia/melena.  Marland Kitchen  FINDINGS: Aortic angiogram:  Normal course caliber and contour of the abdominal aorta. Multiple segmental vessels fill within the lower thoracic spine and lumbar spine. No aneurysm dissection flap identified. No significant atherosclerotic changes.  Steep angled take-off of the bilateral common iliac arteries.  Single bilateral renal arteries.  No celiac artery origin.  Superior mesenteric artery patent, with configuration of a celiacomesenteric trunk.  Late phase of the aortic rind demonstrates no evidence of bleeding.  Celiacomesenteric angiogram:  Normal caliber and contour of the origin of the trunk. Multiple jejunal branches present. Normal caliber and contour of the ileocolic artery. Multiple tortuous vessels overlie the proximal 3rd of the trunk, with evidence of a replaced common hepatic artery contributing to both right and left hepatic arteries. There is a replaced vessel representing a celiac vessel, with cross-filling at a bifurcation to the splenic artery and the hepatic arteries. Patent left gastric artery. New  Late phase of the  trunk injection demonstrates no evidence of bleeding. No tumor blush or pooling of contrast. Patent portal venous system identified.  Common hepatic artery injection:  Replaced common hepatic artery with right hepatic arteries and left hepatic arteries from the replaced trunk. No evidence of tumor blush, aneurysm, dissection flap, pulling of contrast. No abnormal enhancement identified. No pseudoaneurysm identified.  No call it artery injection:  Patent middle call it artery of normal caliber. Significant cross flow from collateral vessels with no pooling of contrast, pseudoaneurysm, or tumor blush.  Proximal jejunal arcade:  Normal course caliber and contour. Normal blush of the proximal jejunal mucosa. No filling defects, or pseudoaneurysm.  No embolization was performed.  IMPRESSION: Status post diagnostic mesenteric angiogram.  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  No bleeding was identified from the replaced common hepatic artery, which would be the best candidate for supplying the pancreaticoduodenal arteries near the known proximal duodenum ulcer. Because no bleeding was identified of this variant anatomy, no empiric embolization was performed.  Signed,  Dulcy Fanny. Earleen Newport, DO  [facility]  [facility]  PLAN: The patient will require 6 hours flattened bed with log roll only for hemostasis of the right common femoral artery after manual compression.  Agree with ICU management, including serial hemoglobin and hematocrit checks.   Electronically Signed   By: Corrie Mckusick D.O.   On: 05/05/2014 16:37   Ir Angiogram Selective Each Additional Vessel  05/05/2014   INDICATION: 34 year old female admitted to the hospital with upper GI bleeding and hematemesis.  She was admitted April 26, 2013. Upper endoscopy demonstrates duodenum ulcer.  She has been stable over 1 week, though developed  syncopal episode and acute anemia with a hemoglobin of 5.7 on today's date. She has been referred for evaluation and treatment of the duodenum ulcer hemorrhage.  EXAM: 1. AORTIC ANGIOGRAM 2. SUPERIOR MESENTERIC ARTERIOGRAM 3. SUB SELECTION OF FOUR TERTIARY BRANCHES FROM THE SUPERIOR MESENTERIC ARTERY 4. ULTRASOUND GUIDED VASCULAR ACCESS OF THE RIGHT COMMON FEMORAL ARTERY 5. MANUAL PRESSURE FOR HEMOSTASIS.  COMPARISON:  CT abdomen pelvis, 04/27/2013, 09/22/2001  MEDICATIONS: Versed 2.0 mg IV; Fentanyl 100 mcg IV  CONTRAST:  135m OMNIPAQUE IOHEXOL 300 MG/ML  SOLN  ANESTHESIA/SEDATION: One  hundred fifty minutes  FLUOROSCOPY TIME:  Forty-one minutes.  Fifty-four seconds.  2941 mGy  ACCESS: Right common femoral artery; hemostasis achieved with manual compression.  COMPLICATIONS: None immediate  TECHNIQUE: Informed written consent was obtained from the patient after a discussion of the risks, benefits and alternatives to treatment. Questions regarding the procedure were encouraged and answered. A timeout was performed prior to the initiation of the procedure.  The right groin was prepped and draped in the usual sterile fashion, and a sterile drape was applied covering the operative field. Maximum barrier sterile technique with sterile gowns and gloves were used for the procedure. A timeout was performed prior to the initiation of the procedure. Local anesthesia was provided with 1% lidocaine.  Physical exam was performed to identify the location of the right common femoral artery. Ultrasound survey was performed with images stored sent to PACs.  Once the common femoral artery was identified under ultrasound, ultrasound guidance was used to generously infiltrate the skin and subcutaneous tissues with 1% lidocaine without epinephrine. A micropuncture access was then used to puncture the right common femoral artery. With excellent blood flow returned, and micro wire was advanced through the needle observed under fluoroscopy  to enter the iliac system. The needle was removed from wire a micropuncture kit was advanced over the wire. The inner dilator and cannula were removed with a wire, and an 035 Bentson wire was advanced into the abdominal aorta under fluoroscopy the micro puncture was removed, and a 5 French sheath was advanced over the Bentson wire into the common femoral artery. The dilator was removed and the sheath was flushed.  A pigtail Flush catheter was advanced over the Bentson wire into the lower thoracic aorta. Aortic flush angiogram performed.  Pigtail catheter was then exchanged over wire for C2 Cobra catheter which was used to select the superior mesenteric artery. Superior mesenteric artery angiogram was performed.  Subsequently, a combination of the micro catheter a micro wire were used to select various branches of superior mesenteric artery in this patient with a celiacomesenteric trunk. A standard renegade micro catheter as well as a soft synchro an a standard .016 synchro wire were used to select branches of the superior mesenteric artery. Sub selection included:  Replaced common hepatic artery.  Right hepatic artery.  New middle colicky artery.  Proximal jejunal branches.  Two base catheter exchanges were made on the Bentson wire including exchange for Mickelson catheter and exchanged for a C2 Cobra catheter for formation of a Waltman loop.  After dedicated angiogram of each of the sub selected arteries, all catheters and wires were removed.  Angiogram through the sheath was performed with the common femoral artery.  Deployment of an Exoseal device for hemostasis failed without deployment. Manual pressure was used for hemostasis of the right common femoral artery.  Patient tolerated procedure well and remained hemodynamically stable throughout. At the end of the procedure, the patient's vital signs were within normal range. Systolic brought pressure 110. Heart rate of 80-90. No syncopal events, no altered mental  status, no hematemesis, and no hematochezia/melena.  Marland Kitchen  FINDINGS: Aortic angiogram:  Normal course caliber and contour of the abdominal aorta. Multiple segmental vessels fill within the lower thoracic spine and lumbar spine. No aneurysm dissection flap identified. No significant atherosclerotic changes.  Steep angled take-off of the bilateral common iliac arteries.  Single bilateral renal arteries.  No celiac artery origin.  Superior mesenteric artery patent, with configuration of a celiacomesenteric trunk.  Late phase of  the aortic rind demonstrates no evidence of bleeding.  Celiacomesenteric angiogram:  Normal caliber and contour of the origin of the trunk. Multiple jejunal branches present. Normal caliber and contour of the ileocolic artery. Multiple tortuous vessels overlie the proximal 3rd of the trunk, with evidence of a replaced common hepatic artery contributing to both right and left hepatic arteries. There is a replaced vessel representing a celiac vessel, with cross-filling at a bifurcation to the splenic artery and the hepatic arteries. Patent left gastric artery. New  Late phase of the trunk injection demonstrates no evidence of bleeding. No tumor blush or pooling of contrast. Patent portal venous system identified.  Common hepatic artery injection:  Replaced common hepatic artery with right hepatic arteries and left hepatic arteries from the replaced trunk. No evidence of tumor blush, aneurysm, dissection flap, pulling of contrast. No abnormal enhancement identified. No pseudoaneurysm identified.  No call it artery injection:  Patent middle call it artery of normal caliber. Significant cross flow from collateral vessels with no pooling of contrast, pseudoaneurysm, or tumor blush.  Proximal jejunal arcade:  Normal course caliber and contour. Normal blush of the proximal jejunal mucosa. No filling defects, or pseudoaneurysm.  No embolization was performed.  IMPRESSION: Status post diagnostic mesenteric  angiogram.  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  No bleeding was identified from the replaced common hepatic artery, which would be the best candidate for supplying the pancreaticoduodenal arteries near the known proximal duodenum ulcer. Because no bleeding was identified of this variant anatomy, no empiric embolization was performed.  Signed,  Dulcy Fanny. Earleen Newport, DO  [facility]  [facility]  PLAN: The patient will require 6 hours flattened bed with log roll only for hemostasis of the right common femoral artery after manual compression.  Agree with ICU management, including serial hemoglobin and hematocrit checks.   Electronically Signed   By: Corrie Mckusick D.O.   On: 05/05/2014 16:37   Ir Angiogram Selective Each Additional Vessel  05/05/2014   INDICATION: 34 year old female admitted to the hospital with upper GI bleeding and hematemesis.  She was admitted April 26, 2013. Upper endoscopy demonstrates duodenum ulcer.  She has been stable over 1 week, though developed syncopal episode and acute anemia with a hemoglobin of 5.7 on today's date. She has been referred for evaluation and treatment of the duodenum ulcer hemorrhage.  EXAM: 1. AORTIC ANGIOGRAM 2. SUPERIOR MESENTERIC ARTERIOGRAM 3. SUB SELECTION OF FOUR TERTIARY BRANCHES FROM THE SUPERIOR MESENTERIC ARTERY 4. ULTRASOUND GUIDED VASCULAR ACCESS OF THE RIGHT COMMON FEMORAL ARTERY 5. MANUAL PRESSURE FOR HEMOSTASIS.  COMPARISON:  CT abdomen pelvis, 04/27/2013, 09/22/2001  MEDICATIONS: Versed 2.0 mg IV; Fentanyl 100 mcg IV  CONTRAST:  157m OMNIPAQUE IOHEXOL 300 MG/ML  SOLN  ANESTHESIA/SEDATION: One hundred fifty minutes  FLUOROSCOPY TIME:  Forty-one minutes.  Fifty-four seconds.  2941 mGy  ACCESS: Right common femoral artery; hemostasis achieved with manual compression.  COMPLICATIONS: None immediate  TECHNIQUE: Informed written consent  was obtained from the patient after a discussion of the risks, benefits and alternatives to treatment. Questions regarding the procedure were encouraged and answered. A timeout was performed prior to the initiation of the procedure.  The right groin was prepped and draped in the usual sterile fashion, and a sterile drape was applied covering the operative field. Maximum barrier sterile technique with sterile gowns and gloves were used for the procedure. A timeout was performed prior to the initiation of the  procedure. Local anesthesia was provided with 1% lidocaine.  Physical exam was performed to identify the location of the right common femoral artery. Ultrasound survey was performed with images stored sent to PACs.  Once the common femoral artery was identified under ultrasound, ultrasound guidance was used to generously infiltrate the skin and subcutaneous tissues with 1% lidocaine without epinephrine. A micropuncture access was then used to puncture the right common femoral artery. With excellent blood flow returned, and micro wire was advanced through the needle observed under fluoroscopy to enter the iliac system. The needle was removed from wire a micropuncture kit was advanced over the wire. The inner dilator and cannula were removed with a wire, and an 035 Bentson wire was advanced into the abdominal aorta under fluoroscopy the micro puncture was removed, and a 5 French sheath was advanced over the Bentson wire into the common femoral artery. The dilator was removed and the sheath was flushed.  A pigtail Flush catheter was advanced over the Bentson wire into the lower thoracic aorta. Aortic flush angiogram performed.  Pigtail catheter was then exchanged over wire for C2 Cobra catheter which was used to select the superior mesenteric artery. Superior mesenteric artery angiogram was performed.  Subsequently, a combination of the micro catheter a micro wire were used to select various branches of superior  mesenteric artery in this patient with a celiacomesenteric trunk. A standard renegade micro catheter as well as a soft synchro an a standard .016 synchro wire were used to select branches of the superior mesenteric artery. Sub selection included:  Replaced common hepatic artery.  Right hepatic artery.  New middle colicky artery.  Proximal jejunal branches.  Two base catheter exchanges were made on the Bentson wire including exchange for Mickelson catheter and exchanged for a C2 Cobra catheter for formation of a Waltman loop.  After dedicated angiogram of each of the sub selected arteries, all catheters and wires were removed.  Angiogram through the sheath was performed with the common femoral artery.  Deployment of an Exoseal device for hemostasis failed without deployment. Manual pressure was used for hemostasis of the right common femoral artery.  Patient tolerated procedure well and remained hemodynamically stable throughout. At the end of the procedure, the patient's vital signs were within normal range. Systolic brought pressure 110. Heart rate of 80-90. No syncopal events, no altered mental status, no hematemesis, and no hematochezia/melena.  Marland Kitchen  FINDINGS: Aortic angiogram:  Normal course caliber and contour of the abdominal aorta. Multiple segmental vessels fill within the lower thoracic spine and lumbar spine. No aneurysm dissection flap identified. No significant atherosclerotic changes.  Steep angled take-off of the bilateral common iliac arteries.  Single bilateral renal arteries.  No celiac artery origin.  Superior mesenteric artery patent, with configuration of a celiacomesenteric trunk.  Late phase of the aortic rind demonstrates no evidence of bleeding.  Celiacomesenteric angiogram:  Normal caliber and contour of the origin of the trunk. Multiple jejunal branches present. Normal caliber and contour of the ileocolic artery. Multiple tortuous vessels overlie the proximal 3rd of the trunk, with evidence  of a replaced common hepatic artery contributing to both right and left hepatic arteries. There is a replaced vessel representing a celiac vessel, with cross-filling at a bifurcation to the splenic artery and the hepatic arteries. Patent left gastric artery. New  Late phase of the trunk injection demonstrates no evidence of bleeding. No tumor blush or pooling of contrast. Patent portal venous system identified.  Common hepatic artery injection:  Replaced common hepatic artery with right hepatic arteries and left hepatic arteries from the replaced trunk. No evidence of tumor blush, aneurysm, dissection flap, pulling of contrast. No abnormal enhancement identified. No pseudoaneurysm identified.  No call it artery injection:  Patent middle call it artery of normal caliber. Significant cross flow from collateral vessels with no pooling of contrast, pseudoaneurysm, or tumor blush.  Proximal jejunal arcade:  Normal course caliber and contour. Normal blush of the proximal jejunal mucosa. No filling defects, or pseudoaneurysm.  No embolization was performed.  IMPRESSION: Status post diagnostic mesenteric angiogram.  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  No bleeding was identified from the replaced common hepatic artery, which would be the best candidate for supplying the pancreaticoduodenal arteries near the known proximal duodenum ulcer. Because no bleeding was identified of this variant anatomy, no empiric embolization was performed.  Signed,  Dulcy Fanny. Earleen Newport, DO  [facility]  [facility] Radiology  PLAN: The patient will require 6 hours flattened bed with log roll only for hemostasis of the right common femoral artery after manual compression.  Agree with ICU management, including serial hemoglobin and hematocrit checks.   Electronically Signed   By: Corrie Mckusick D.O.   On: 05/05/2014 16:37   Ir Angiogram  Selective Each Additional Vessel  05/05/2014   INDICATION: 34 year old female admitted to the hospital with upper GI bleeding and hematemesis.  She was admitted April 26, 2013. Upper endoscopy demonstrates duodenum ulcer.  She has been stable over 1 week, though developed syncopal episode and acute anemia with a hemoglobin of 5.7 on today's date. She has been referred for evaluation and treatment of the duodenum ulcer hemorrhage.  EXAM: 1. AORTIC ANGIOGRAM 2. SUPERIOR MESENTERIC ARTERIOGRAM 3. SUB SELECTION OF FOUR TERTIARY BRANCHES FROM THE SUPERIOR MESENTERIC ARTERY 4. ULTRASOUND GUIDED VASCULAR ACCESS OF THE RIGHT COMMON FEMORAL ARTERY 5. MANUAL PRESSURE FOR HEMOSTASIS.  COMPARISON:  CT abdomen pelvis, 04/27/2013, 09/22/2001  MEDICATIONS: Versed 2.0 mg IV; Fentanyl 100 mcg IV  CONTRAST:  159m OMNIPAQUE IOHEXOL 300 MG/ML  SOLN  ANESTHESIA/SEDATION: One hundred fifty minutes  FLUOROSCOPY TIME:  Forty-one minutes.  Fifty-four seconds.  2941 mGy  ACCESS: Right common femoral artery; hemostasis achieved with manual compression.  COMPLICATIONS: None immediate  TECHNIQUE: Informed written consent was obtained from the patient after a discussion of the risks, benefits and alternatives to treatment. Questions regarding the procedure were encouraged and answered. A timeout was performed prior to the initiation of the procedure.  The right groin was prepped and draped in the usual sterile fashion, and a sterile drape was applied covering the operative field. Maximum barrier sterile technique with sterile gowns and gloves were used for the procedure. A timeout was performed prior to the initiation of the procedure. Local anesthesia was provided with 1% lidocaine.  Physical exam was performed to identify the location of the right common femoral artery. Ultrasound survey was performed with images stored sent to PACs.  Once the common femoral artery was identified under ultrasound, ultrasound guidance was used to generously  infiltrate the skin and subcutaneous tissues with 1% lidocaine without epinephrine. A micropuncture access was then used to puncture the right common femoral artery. With excellent blood flow returned, and micro wire was advanced through the needle observed under fluoroscopy to enter the iliac system. The needle was removed from wire a micropuncture kit was advanced over the wire. The inner dilator and cannula were removed with a  wire, and an 035 Bentson wire was advanced into the abdominal aorta under fluoroscopy the micro puncture was removed, and a 5 French sheath was advanced over the Bentson wire into the common femoral artery. The dilator was removed and the sheath was flushed.  A pigtail Flush catheter was advanced over the Bentson wire into the lower thoracic aorta. Aortic flush angiogram performed.  Pigtail catheter was then exchanged over wire for C2 Cobra catheter which was used to select the superior mesenteric artery. Superior mesenteric artery angiogram was performed.  Subsequently, a combination of the micro catheter a micro wire were used to select various branches of superior mesenteric artery in this patient with a celiacomesenteric trunk. A standard renegade micro catheter as well as a soft synchro an a standard .016 synchro wire were used to select branches of the superior mesenteric artery. Sub selection included:  Replaced common hepatic artery.  Right hepatic artery.  New middle colicky artery.  Proximal jejunal branches.  Two base catheter exchanges were made on the Bentson wire including exchange for Mickelson catheter and exchanged for a C2 Cobra catheter for formation of a Waltman loop.  After dedicated angiogram of each of the sub selected arteries, all catheters and wires were removed.  Angiogram through the sheath was performed with the common femoral artery.  Deployment of an Exoseal device for hemostasis failed without deployment. Manual pressure was used for hemostasis of the right  common femoral artery.  Patient tolerated procedure well and remained hemodynamically stable throughout. At the end of the procedure, the patient's vital signs were within normal range. Systolic brought pressure 110. Heart rate of 80-90. No syncopal events, no altered mental status, no hematemesis, and no hematochezia/melena.  Marland Kitchen  FINDINGS: Aortic angiogram:  Normal course caliber and contour of the abdominal aorta. Multiple segmental vessels fill within the lower thoracic spine and lumbar spine. No aneurysm dissection flap identified. No significant atherosclerotic changes.  Steep angled take-off of the bilateral common iliac arteries.  Single bilateral renal arteries.  No celiac artery origin.  Superior mesenteric artery patent, with configuration of a celiacomesenteric trunk.  Late phase of the aortic rind demonstrates no evidence of bleeding.  Celiacomesenteric angiogram:  Normal caliber and contour of the origin of the trunk. Multiple jejunal branches present. Normal caliber and contour of the ileocolic artery. Multiple tortuous vessels overlie the proximal 3rd of the trunk, with evidence of a replaced common hepatic artery contributing to both right and left hepatic arteries. There is a replaced vessel representing a celiac vessel, with cross-filling at a bifurcation to the splenic artery and the hepatic arteries. Patent left gastric artery. New  Late phase of the trunk injection demonstrates no evidence of bleeding. No tumor blush or pooling of contrast. Patent portal venous system identified.  Common hepatic artery injection:  Replaced common hepatic artery with right hepatic arteries and left hepatic arteries from the replaced trunk. No evidence of tumor blush, aneurysm, dissection flap, pulling of contrast. No abnormal enhancement identified. No pseudoaneurysm identified.  No call it artery injection:  Patent middle call it artery of normal caliber. Significant cross flow from collateral vessels with no  pooling of contrast, pseudoaneurysm, or tumor blush.  Proximal jejunal arcade:  Normal course caliber and contour. Normal blush of the proximal jejunal mucosa. No filling defects, or pseudoaneurysm.  No embolization was performed.  IMPRESSION: Status post diagnostic mesenteric angiogram.  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  No bleeding was  identified from the replaced common hepatic artery, which would be the best candidate for supplying the pancreaticoduodenal arteries near the known proximal duodenum ulcer. Because no bleeding was identified of this variant anatomy, no empiric embolization was performed.  Signed,  Dulcy Fanny. Earleen Newport, DO  [facility]  [facility] Radiology  PLAN: The patient will require 6 hours flattened bed with log roll only for hemostasis of the right common femoral artery after manual compression.  Agree with ICU management, including serial hemoglobin and hematocrit checks.   Electronically Signed   By: Corrie Mckusick D.O.   On: 05/05/2014 16:37   Ir US Guide Vasc Access Right  05/05/2014   INDICATION: 34 year old female admitted to the hospital with upper GI bleeding and hematemesis.  She was admitted April 26, 2013. Upper endoscopy demonstrates duodenum ulcer.  She has been stable over 1 week, though developed syncopal episode and acute anemia with a hemoglobin of 5.7 on today's date. She has been referred for evaluation and treatment of the duodenum ulcer hemorrhage.  EXAM: 1. AORTIC ANGIOGRAM 2. SUPERIOR MESENTERIC ARTERIOGRAM 3. SUB SELECTION OF FOUR TERTIARY BRANCHES FROM THE SUPERIOR MESENTERIC ARTERY 4. ULTRASOUND GUIDED VASCULAR ACCESS OF THE RIGHT COMMON FEMORAL ARTERY 5. MANUAL PRESSURE FOR HEMOSTASIS.  COMPARISON:  CT abdomen pelvis, 04/27/2013, 09/22/2001  MEDICATIONS: Versed 2.0 mg IV; Fentanyl 100 mcg IV  CONTRAST:  146m OMNIPAQUE IOHEXOL 300 MG/ML  SOLN   ANESTHESIA/SEDATION: One hundred fifty minutes  FLUOROSCOPY TIME:  Forty-one minutes.  Fifty-four seconds.  2941 mGy  ACCESS: Right common femoral artery; hemostasis achieved with manual compression.  COMPLICATIONS: None immediate  TECHNIQUE: Informed written consent was obtained from the patient after a discussion of the risks, benefits and alternatives to treatment. Questions regarding the procedure were encouraged and answered. A timeout was performed prior to the initiation of the procedure.  The right groin was prepped and draped in the usual sterile fashion, and a sterile drape was applied covering the operative field. Maximum barrier sterile technique with sterile gowns and gloves were used for the procedure. A timeout was performed prior to the initiation of the procedure. Local anesthesia was provided with 1% lidocaine.  Physical exam was performed to identify the location of the right common femoral artery. Ultrasound survey was performed with images stored sent to PACs.  Once the common femoral artery was identified under ultrasound, ultrasound guidance was used to generously infiltrate the skin and subcutaneous tissues with 1% lidocaine without epinephrine. A micropuncture access was then used to puncture the right common femoral artery. With excellent blood flow returned, and micro wire was advanced through the needle observed under fluoroscopy to enter the iliac system. The needle was removed from wire a micropuncture kit was advanced over the wire. The inner dilator and cannula were removed with a wire, and an 035 Bentson wire was advanced into the abdominal aorta under fluoroscopy the micro puncture was removed, and a 5 French sheath was advanced over the Bentson wire into the common femoral artery. The dilator was removed and the sheath was flushed.  A pigtail Flush catheter was advanced over the Bentson wire into the lower thoracic aorta. Aortic flush angiogram performed.  Pigtail catheter was then  exchanged over wire for C2 Cobra catheter which was used to select the superior mesenteric artery. Superior mesenteric artery angiogram was performed.  Subsequently, a combination of the micro catheter a micro wire were used to select various branches of superior mesenteric artery in this patient with a  celiacomesenteric trunk. A standard renegade micro catheter as well as a soft synchro an a standard .016 synchro wire were used to select branches of the superior mesenteric artery. Sub selection included:  Replaced common hepatic artery.  Right hepatic artery.  New middle colicky artery.  Proximal jejunal branches.  Two base catheter exchanges were made on the Bentson wire including exchange for Mickelson catheter and exchanged for a C2 Cobra catheter for formation of a Waltman loop.  After dedicated angiogram of each of the sub selected arteries, all catheters and wires were removed.  Angiogram through the sheath was performed with the common femoral artery.  Deployment of an Exoseal device for hemostasis failed without deployment. Manual pressure was used for hemostasis of the right common femoral artery.  Patient tolerated procedure well and remained hemodynamically stable throughout. At the end of the procedure, the patient's vital signs were within normal range. Systolic brought pressure 110. Heart rate of 80-90. No syncopal events, no altered mental status, no hematemesis, and no hematochezia/melena.  Marland Kitchen  FINDINGS: Aortic angiogram:  Normal course caliber and contour of the abdominal aorta. Multiple segmental vessels fill within the lower thoracic spine and lumbar spine. No aneurysm dissection flap identified. No significant atherosclerotic changes.  Steep angled take-off of the bilateral common iliac arteries.  Single bilateral renal arteries.  No celiac artery origin.  Superior mesenteric artery patent, with configuration of a celiacomesenteric trunk.  Late phase of the aortic rind demonstrates no evidence of  bleeding.  Celiacomesenteric angiogram:  Normal caliber and contour of the origin of the trunk. Multiple jejunal branches present. Normal caliber and contour of the ileocolic artery. Multiple tortuous vessels overlie the proximal 3rd of the trunk, with evidence of a replaced common hepatic artery contributing to both right and left hepatic arteries. There is a replaced vessel representing a celiac vessel, with cross-filling at a bifurcation to the splenic artery and the hepatic arteries. Patent left gastric artery. New  Late phase of the trunk injection demonstrates no evidence of bleeding. No tumor blush or pooling of contrast. Patent portal venous system identified.  Common hepatic artery injection:  Replaced common hepatic artery with right hepatic arteries and left hepatic arteries from the replaced trunk. No evidence of tumor blush, aneurysm, dissection flap, pulling of contrast. No abnormal enhancement identified. No pseudoaneurysm identified.  No call it artery injection:  Patent middle call it artery of normal caliber. Significant cross flow from collateral vessels with no pooling of contrast, pseudoaneurysm, or tumor blush.  Proximal jejunal arcade:  Normal course caliber and contour. Normal blush of the proximal jejunal mucosa. No filling defects, or pseudoaneurysm.  No embolization was performed.  IMPRESSION: Status post diagnostic mesenteric angiogram.  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  No bleeding was identified from the replaced common hepatic artery, which would be the best candidate for supplying the pancreaticoduodenal arteries near the known proximal duodenum ulcer. Because no bleeding was identified of this variant anatomy, no empiric embolization was performed.  Signed,  Dulcy Fanny. Earleen Newport, DO  [facility]  [facility] Radiology  PLAN: The patient will require 6 hours flattened bed  with log roll only for hemostasis of the right common femoral artery after manual compression.  Agree with ICU management, including serial hemoglobin and hematocrit checks.   Electronically Signed   By: Corrie Mckusick D.O.   On: 05/05/2014 16:37   ASSESSMENT / PLAN:  PULMONARY A: Chronic Tobacco Abuse  P:   Smoking cessation Nicotine patch d/c for now  PRN albuterol for wheezing  Pulmonary hygiene Nasal hygiene - flonase + saline spray  D/C O2, not needed.  CARDIOVASCULAR A: Hypovolemic shock -Hemorrhagic  P:   Fluid bolus  PRBC transfusion as needed. Transfer to tele. No need for pressors.  RENAL A: Lactic Acidosis - resolving Chronic cysttis Hyperkalemia P:   Strict I/O  Repeat bmet now now Replace electrolytes as indicated  BMET in AM.   GASTROINTESTINAL A: Major Upper GI Bleed - likely in setting of NSAID / ETOH usage.  Massive cratered duodenal bulb ulcer noted on EGD 3/6 Rebleed >to go to IR for embolization  P:   Protonix gtt GI Following,   NPO until cleared by GI. D/c constipation meds  For now   HEMATOLOGIC A: Anemia - due to hemorrhage. P:    2 units PRBC, 2 on hold  CBC serial   INFECTIOUS A: No active issues P:   Monitor fever curve / WBC  ENDOCRINE A: Hypothyroidism P:   Synthroid 25 mcg IV, transition back to PO once cleared for intake   NEUROLOGIC A: Anxiety BZD dependence Opiate dependence P:   Avoid  sedating rx if possible  Hold  elavil, klonopin, percocet 10, effexor for now, restart once able to take PO. PRN IV ativan for anxiety PRN dilaudid for pain   Transfer to tele, transfer back to [facility], PCCM will sign off 3/8.  Rush Farmer, M.D. [facility] Pulmonary/Critical Care Medicine. Pager: [phone]. After hours pager: [phone].

## 2014-05-06 NOTE — Progress Notes (Signed)
Report received from Lauren RN for patient to be transferred into 5w21

## 2014-05-06 NOTE — Progress Notes (Signed)
UR Completed.  [phone]  

## 2014-05-06 NOTE — Progress Notes (Signed)
Pt arrived on unit, alert and oriented. In no acute distress. No complaints of pain. Oriented to room and staff. Call light placed within reached. Bed at its lowest position. We will continue to monitor.

## 2014-05-06 NOTE — Evaluation (Signed)
Physical Therapy Evaluation/ discharge Patient Details Name: Kimberly Weiss MRN: [id number] DOB: June 21, 1980 Today's Date: 05/06/2014   History of Present Illness  34 y/o woman admitted 2/26 after syncopal episode. Work up concerning for UGI bleed likely due to NSAID usage. PCCM consulted for ICU admission 2/26 . Found to have large UGIB d/t duodenal ulcer on EGD .Initially required 5 u PRBC 3/6  Hypotension/GI bleed. Pt transferred to ICU . IR for embolization 3/6  Clinical Impression  Ms.Ackerley is moving well with baseline mobility limitations due to chronic back pain with difficulty donning socks but is able to perform without assist and states she wears slide on shoes all year. Pt educated for back precautions to decrease pain and difficulty. Pt also educated for bil LE HEP with handout provided and demonstration due to generalized lower body weakness. All education completed with Ms.Highland encouraged to perform HEp and ambulate 3x/day. No dizziness, lightheadedness with gait and reports no falls other than syncope. No further acute needs at this time and recommend OPPT for back pain, pt aware and agreeable and states she had a prior OPPT referral but did not pursue.    Follow Up Recommendations Outpatient PT    Equipment Recommendations  None recommended by PT    Recommendations for Other Services       Precautions / Restrictions Precautions Precautions: Fall      Mobility  Bed Mobility Overal bed mobility: Modified Independent Bed Mobility: Supine to Sit     Supine to sit: Modified independent (Device/Increase time)        Transfers Overall transfer level: Needs assistance   Transfers: Sit to/from Stand Sit to Stand: Supervision         General transfer comment: supervision only for lines no physical assist  Ambulation/Gait Ambulation/Gait assistance: Supervision Ambulation Distance (Feet): 700 Feet Assistive device: None Gait Pattern/deviations: Step-through  pattern;Decreased stride length;Decreased step length - left   Gait velocity interpretation: Below normal speed for age/gender General Gait Details: slight trendelenburg gait which pt states is baseline  Stairs Stairs: Yes Stairs assistance: Supervision Stair Management: Step to pattern;No rails Number of Stairs: 4 General stair comments: cues for sequence to decrease pain as pt leading ascending and descending with left leg  Wheelchair Mobility    Modified Rankin (Stroke Patients Only)       Balance Overall balance assessment: No apparent balance deficits (not formally assessed)                                           Pertinent Vitals/Pain Pain Assessment: 0-10 Pain Score: 5  Pain Location: sharp pain in left posterior SI joint with walking    Home Living Family/patient expects to be discharged to:: Private residence Living Arrangements: Children;Parent Available Help at Discharge: Family;Available PRN/intermittently Type of Home: Mobile home Home Access: Stairs to enter Entrance Stairs-Rails: None Entrance Stairs-Number of Steps: 5   Home Equipment: None      Prior Function Level of Independence: Independent               Hand Dominance        Extremity/Trunk Assessment   Upper Extremity Assessment: Overall WFL for tasks assessed           Lower Extremity Assessment: Generalized weakness      Cervical / Trunk Assessment: Other exceptions  Communication   Communication: No difficulties  Cognition  Arousal/Alertness: Awake/alert Behavior During Therapy: WFL for tasks assessed/performed Overall Cognitive Status: Within Functional Limits for tasks assessed                      General Comments      Exercises        Assessment/Plan    PT Assessment All further PT needs can be met in the next venue of care  PT Diagnosis Difficulty walking;Acute pain   PT Problem List Decreased strength;Pain  PT Treatment  Interventions     PT Goals (Current goals can be found in the Care Plan section) Acute Rehab PT Goals PT Goal Formulation: All assessment and education complete, DC therapy    Frequency     Barriers to discharge        Co-evaluation               End of Session   Activity Tolerance: Patient tolerated treatment well Patient left: in chair;with call bell/phone within reach Nurse Communication: Mobility status         Time: 0750-0828 PT Time Calculation (min) (ACUTE ONLY): 38 min   Charges:   PT Evaluation $Initial PT Evaluation Tier I: 1 Procedure PT Treatments $Therapeutic Activity: 8-22 mins   PT G CodesMelford Aase 05/06/2014, 10:20 AM Elwyn Reach, [place]

## 2014-05-07 ENCOUNTER — Inpatient Hospital Stay (HOSPITAL_COMMUNITY): Payer: Medicaid Other | Admitting: Anesthesiology

## 2014-05-07 ENCOUNTER — Encounter (HOSPITAL_COMMUNITY)
Admission: EM | Disposition: A | Discharge status: Home / Self Care | Payer: Self-pay | Source: Home / Self Care | Attending: Internal Medicine

## 2014-05-07 DIAGNOSIS — R579 Shock, unspecified: Secondary | ICD-10-CM

## 2014-05-07 DIAGNOSIS — R578 Other shock: Secondary | ICD-10-CM | POA: Insufficient documentation

## 2014-05-07 DIAGNOSIS — K264 Chronic or unspecified duodenal ulcer with hemorrhage: Secondary | ICD-10-CM | POA: Insufficient documentation

## 2014-05-07 HISTORY — PX: LAPAROTOMY: SHX154

## 2014-05-07 HISTORY — PX: REPAIR OF PERFORATED ULCER: SHX6065

## 2014-05-07 LAB — CBC
HCT: 23.1 % — ABNORMAL LOW (ref 36.0–46.0)
Hemoglobin: 7.8 g/dL — ABNORMAL LOW (ref 12.0–15.0)
MCH: 29.1 pg (ref 26.0–34.0)
MCHC: 33.8 g/dL (ref 30.0–36.0)
MCV: 86.2 fL (ref 78.0–100.0)
Platelets: 217 10*3/uL (ref 150–400)
RBC: 2.68 MIL/uL — ABNORMAL LOW (ref 3.87–5.11)
RDW: 15.3 % (ref 11.5–15.5)
WBC: 9.9 10*3/uL (ref 4.0–10.5)

## 2014-05-07 LAB — MAGNESIUM: Magnesium: 2 mg/dL (ref 1.5–2.5)

## 2014-05-07 LAB — HEMOGLOBIN AND HEMATOCRIT, BLOOD
HCT: 20.2 % — ABNORMAL LOW (ref 36.0–46.0)
Hemoglobin: 6.8 g/dL — CL (ref 12.0–15.0)

## 2014-05-07 LAB — BASIC METABOLIC PANEL
Anion gap: 4 — ABNORMAL LOW (ref 5–15)
BUN: 14 mg/dL (ref 6–23)
CO2: 24 mmol/L (ref 19–32)
Calcium: 7.7 mg/dL — ABNORMAL LOW (ref 8.4–10.5)
Chloride: 109 mmol/L (ref 96–112)
Creatinine, Ser: 0.53 mg/dL (ref 0.50–1.10)
GFR calc Af Amer: >90 mL/min (ref >90)
GFR calc non Af Amer: >90 mL/min (ref >90)
Glucose, Bld: 102 mg/dL — ABNORMAL HIGH (ref 70–99)
Potassium: 3.7 mmol/L (ref 3.5–5.1)
Sodium: 137 mmol/L (ref 135–145)

## 2014-05-07 LAB — PROTIME-INR
INR: 1.18 (ref 0.00–1.49)
Prothrombin Time: 15.2 seconds (ref 11.6–15.2)

## 2014-05-07 LAB — PHOSPHORUS: Phosphorus: 2.8 mg/dL (ref 2.3–4.6)

## 2014-05-07 LAB — PREPARE RBC (CROSSMATCH)

## 2014-05-07 SURGERY — LAPAROTOMY, EXPLORATORY
Anesthesia: General

## 2014-05-07 MED ORDER — SODIUM CHLORIDE 0.9 % IV BOLUS (SEPSIS)
500.0000 mL | Freq: Once | INTRAVENOUS | Status: AC
  Administered 2014-05-07: 500 mL via INTRAVENOUS

## 2014-05-07 MED ORDER — SODIUM CHLORIDE 0.9 % IV SOLN: Freq: Once | INTRAVENOUS | Status: AC

## 2014-05-07 MED ORDER — SODIUM CHLORIDE 0.9 % IV SOLN
INTRAVENOUS | Status: DC | PRN
  Administered 2014-05-07: 20:00:00 via INTRAVENOUS

## 2014-05-07 MED ORDER — DIPHENHYDRAMINE HCL 50 MG/ML IJ SOLN: 12.5000 mg | Freq: Four times a day (QID) | INTRAMUSCULAR | Status: DC | PRN

## 2014-05-07 MED ORDER — GLYCOPYRROLATE 0.2 MG/ML IJ SOLN
INTRAMUSCULAR | Status: DC | PRN
  Administered 2014-05-07: .5 mg via INTRAVENOUS

## 2014-05-07 MED ORDER — CEFAZOLIN SODIUM-DEXTROSE 2-3 GM-% IV SOLR
INTRAVENOUS | Status: DC | PRN
  Administered 2014-05-07: 2 g via INTRAVENOUS

## 2014-05-07 MED ORDER — ROCURONIUM BROMIDE 50 MG/5ML IV SOLN
INTRAVENOUS | Status: AC
  Filled 2014-05-07: qty 1

## 2014-05-07 MED ORDER — SUCRALFATE 1 GM/10ML PO SUSP
1.0000 g | Freq: Three times a day (TID) | ORAL | Status: DC
  Administered 2014-05-07 – 2014-05-14 (×23): 1 g via ORAL
  Filled 2014-05-07 (×26): qty 10

## 2014-05-07 MED ORDER — HYDROMORPHONE HCL 1 MG/ML IJ SOLN
INTRAMUSCULAR | Status: AC
  Filled 2014-05-07: qty 1

## 2014-05-07 MED ORDER — 0.9 % SODIUM CHLORIDE (POUR BTL) OPTIME
TOPICAL | Status: DC | PRN
  Administered 2014-05-07 (×2): 1000 mL

## 2014-05-07 MED ORDER — PROPOFOL 10 MG/ML IV BOLUS
INTRAVENOUS | Status: AC
  Filled 2014-05-07: qty 20

## 2014-05-07 MED ORDER — PROMETHAZINE HCL 25 MG/ML IJ SOLN
INTRAMUSCULAR | Status: AC
  Administered 2014-05-07: 6.25 mg via INTRAVENOUS
  Filled 2014-05-07: qty 1

## 2014-05-07 MED ORDER — DEXAMETHASONE SODIUM PHOSPHATE 4 MG/ML IJ SOLN
INTRAMUSCULAR | Status: AC
  Filled 2014-05-07: qty 1

## 2014-05-07 MED ORDER — FENTANYL CITRATE 0.05 MG/ML IJ SOLN
INTRAMUSCULAR | Status: DC | PRN
  Administered 2014-05-07 (×2): 50 ug via INTRAVENOUS
  Administered 2014-05-07: 100 ug via INTRAVENOUS
  Administered 2014-05-07 (×5): 50 ug via INTRAVENOUS
  Administered 2014-05-07: 100 ug via INTRAVENOUS
  Administered 2014-05-07: 50 ug via INTRAVENOUS
  Administered 2014-05-07: 100 ug via INTRAVENOUS
  Administered 2014-05-07: 50 ug via INTRAVENOUS

## 2014-05-07 MED ORDER — LIDOCAINE HCL (CARDIAC) 20 MG/ML IV SOLN
INTRAVENOUS | Status: AC
  Filled 2014-05-07: qty 5

## 2014-05-07 MED ORDER — LORAZEPAM 2 MG/ML IJ SOLN
INTRAMUSCULAR | Status: AC
  Filled 2014-05-07: qty 1

## 2014-05-07 MED ORDER — DIPHENHYDRAMINE HCL 12.5 MG/5ML PO ELIX
12.5000 mg | ORAL_SOLUTION | Freq: Four times a day (QID) | ORAL | Status: DC | PRN
  Filled 2014-05-07: qty 5

## 2014-05-07 MED ORDER — HYDROMORPHONE HCL 1 MG/ML IJ SOLN
0.2500 mg | INTRAMUSCULAR | Status: DC | PRN
  Administered 2014-05-07 (×4): 0.5 mg via INTRAVENOUS

## 2014-05-07 MED ORDER — FENTANYL 10 MCG/ML IV SOLN
INTRAVENOUS | Status: DC
  Administered 2014-05-07: 23:00:00 via INTRAVENOUS
  Filled 2014-05-07: qty 50

## 2014-05-07 MED ORDER — PROMETHAZINE HCL 25 MG/ML IJ SOLN
6.2500 mg | Freq: Once | INTRAMUSCULAR | Status: AC
  Administered 2014-05-07: 6.25 mg via INTRAVENOUS

## 2014-05-07 MED ORDER — ETOMIDATE 2 MG/ML IV SOLN
INTRAVENOUS | Status: AC
  Filled 2014-05-07: qty 10

## 2014-05-07 MED ORDER — SUCCINYLCHOLINE CHLORIDE 20 MG/ML IJ SOLN
INTRAMUSCULAR | Status: DC | PRN
  Administered 2014-05-07: 100 mg via INTRAVENOUS

## 2014-05-07 MED ORDER — CEFAZOLIN SODIUM-DEXTROSE 2-3 GM-% IV SOLR
2.0000 g | Freq: Once | INTRAVENOUS | Status: DC
  Filled 2014-05-07: qty 50

## 2014-05-07 MED ORDER — ETOMIDATE 2 MG/ML IV SOLN
INTRAVENOUS | Status: DC | PRN
  Administered 2014-05-07: 12 mg via INTRAVENOUS

## 2014-05-07 MED ORDER — ARTIFICIAL TEARS OP OINT
TOPICAL_OINTMENT | OPHTHALMIC | Status: AC
  Filled 2014-05-07: qty 7

## 2014-05-07 MED ORDER — NEOSTIGMINE METHYLSULFATE 10 MG/10ML IV SOLN
INTRAVENOUS | Status: DC | PRN
  Administered 2014-05-07: 3 mg via INTRAVENOUS

## 2014-05-07 MED ORDER — BUSPIRONE HCL 15 MG PO TABS
7.5000 mg | ORAL_TABLET | Freq: Two times a day (BID) | ORAL | Status: DC
  Administered 2014-05-07 – 2014-05-14 (×14): 7.5 mg via ORAL
  Filled 2014-05-07 (×16): qty 1

## 2014-05-07 MED ORDER — NICOTINE 14 MG/24HR TD PT24
14.0000 mg | MEDICATED_PATCH | Freq: Every day | TRANSDERMAL | Status: DC
  Administered 2014-05-07 – 2014-05-14 (×8): 14 mg via TRANSDERMAL
  Filled 2014-05-07 (×8): qty 1

## 2014-05-07 MED ORDER — NALOXONE HCL 0.4 MG/ML IJ SOLN: 0.4000 mg | INTRAMUSCULAR | Status: DC | PRN

## 2014-05-07 MED ORDER — LACTATED RINGERS IV SOLN
INTRAVENOUS | Status: DC | PRN
  Administered 2014-05-07: 20:00:00 via INTRAVENOUS

## 2014-05-07 MED ORDER — SODIUM CHLORIDE 0.9 % IJ SOLN: 9.0000 mL | INTRAMUSCULAR | Status: DC | PRN

## 2014-05-07 MED ORDER — SODIUM CHLORIDE 0.9 % IV SOLN: Freq: Once | INTRAVENOUS | Status: DC

## 2014-05-07 MED ORDER — LORAZEPAM 2 MG/ML IJ SOLN
2.0000 mg | Freq: Once | INTRAMUSCULAR | Status: AC
  Administered 2014-05-07: 2 mg via INTRAVENOUS

## 2014-05-07 MED ORDER — FENTANYL CITRATE 0.05 MG/ML IJ SOLN
INTRAMUSCULAR | Status: AC
  Filled 2014-05-07: qty 5

## 2014-05-07 MED ORDER — HYDROMORPHONE HCL 1 MG/ML IJ SOLN
1.0000 mg | INTRAMUSCULAR | Status: DC | PRN
  Administered 2014-05-07: 1 mg via INTRAVENOUS
  Filled 2014-05-07: qty 1

## 2014-05-07 MED ORDER — ROCURONIUM BROMIDE 100 MG/10ML IV SOLN
INTRAVENOUS | Status: DC | PRN
  Administered 2014-05-07: 25 mg via INTRAVENOUS

## 2014-05-07 MED ORDER — ARTIFICIAL TEARS OP OINT
TOPICAL_OINTMENT | OPHTHALMIC | Status: DC | PRN
  Administered 2014-05-07: 1 via OPHTHALMIC

## 2014-05-07 MED ORDER — DEXAMETHASONE SODIUM PHOSPHATE 4 MG/ML IJ SOLN
INTRAMUSCULAR | Status: DC | PRN
  Administered 2014-05-07: 4 mg via INTRAVENOUS

## 2014-05-07 MED ORDER — PHENYLEPHRINE 40 MCG/ML (10ML) SYRINGE FOR IV PUSH (FOR BLOOD PRESSURE SUPPORT)
PREFILLED_SYRINGE | INTRAVENOUS | Status: AC
  Filled 2014-05-07: qty 20

## 2014-05-07 MED ORDER — MIDAZOLAM HCL 2 MG/2ML IJ SOLN
INTRAMUSCULAR | Status: AC
  Filled 2014-05-07: qty 2

## 2014-05-07 SURGICAL SUPPLY — 54 items
BLADE SURG ROTATE 9660 (MISCELLANEOUS) IMPLANT
CANISTER SUCTION 2500CC (MISCELLANEOUS) ×2 IMPLANT
CHLORAPREP W/TINT 26ML (MISCELLANEOUS) ×2 IMPLANT
COVER MAYO STAND STRL (DRAPES) IMPLANT
COVER SURGICAL LIGHT HANDLE (MISCELLANEOUS) ×2 IMPLANT
DRAIN CHANNEL 19F RND (DRAIN) ×2 IMPLANT
DRAPE LAPAROSCOPIC ABDOMINAL (DRAPES) ×2 IMPLANT
DRAPE PROXIMA HALF (DRAPES) IMPLANT
DRAPE UTILITY XL STRL (DRAPES) ×4 IMPLANT
DRAPE WARM FLUID 44X44 (DRAPE) ×2 IMPLANT
DRSG OPSITE POSTOP 4X10 (GAUZE/BANDAGES/DRESSINGS) IMPLANT
DRSG OPSITE POSTOP 4X8 (GAUZE/BANDAGES/DRESSINGS) IMPLANT
ELECT BLADE 6.5 EXT (BLADE) IMPLANT
ELECT CAUTERY BLADE 6.4 (BLADE) ×4 IMPLANT
ELECT REM PT RETURN 9FT ADLT (ELECTROSURGICAL) ×2
ELECTRODE REM PT RTRN 9FT ADLT (ELECTROSURGICAL) ×1 IMPLANT
EVACUATOR SILICONE 100CC (DRAIN) ×2 IMPLANT
GLOVE BIO SURGEON STRL SZ8 (GLOVE) ×2 IMPLANT
GLOVE BIOGEL PI IND STRL 7.0 (GLOVE) ×1 IMPLANT
GLOVE BIOGEL PI IND STRL 8 (GLOVE) ×1 IMPLANT
GLOVE BIOGEL PI IND STRL 8.5 (GLOVE) ×1 IMPLANT
GLOVE BIOGEL PI INDICATOR 7.0 (GLOVE) ×1
GLOVE BIOGEL PI INDICATOR 8 (GLOVE) ×1
GLOVE BIOGEL PI INDICATOR 8.5 (GLOVE) ×1
GOWN STRL REUS W/ TWL LRG LVL3 (GOWN DISPOSABLE) ×2 IMPLANT
GOWN STRL REUS W/ TWL XL LVL3 (GOWN DISPOSABLE) ×1 IMPLANT
GOWN STRL REUS W/TWL LRG LVL3 (GOWN DISPOSABLE) ×2
GOWN STRL REUS W/TWL XL LVL3 (GOWN DISPOSABLE) ×1
KIT BASIN OR (CUSTOM PROCEDURE TRAY) ×2 IMPLANT
KIT ROOM TURNOVER OR (KITS) ×2 IMPLANT
LIGASURE IMPACT 36 18CM CVD LR (INSTRUMENTS) IMPLANT
NS IRRIG 1000ML POUR BTL (IV SOLUTION) ×4 IMPLANT
PACK GENERAL/GYN (CUSTOM PROCEDURE TRAY) ×2 IMPLANT
PAD ABD 8X10 STRL (GAUZE/BANDAGES/DRESSINGS) ×2 IMPLANT
PAD ARMBOARD 7.5X6 YLW CONV (MISCELLANEOUS) ×2 IMPLANT
PENCIL BUTTON HOLSTER BLD 10FT (ELECTRODE) IMPLANT
SHUNT CAROTID BYPASS 10 (VASCULAR PRODUCTS) IMPLANT
SPECIMEN JAR LARGE (MISCELLANEOUS) IMPLANT
SPONGE GAUZE 4X4 12PLY STER LF (GAUZE/BANDAGES/DRESSINGS) ×2 IMPLANT
SPONGE LAP 18X18 X RAY DECT (DISPOSABLE) IMPLANT
STAPLER VISISTAT 35W (STAPLE) ×2 IMPLANT
SUCTION POOLE TIP (SUCTIONS) ×2 IMPLANT
SUT ETHILON 2 0 FS 18 (SUTURE) ×2 IMPLANT
SUT PDS AB 1 TP1 96 (SUTURE) ×4 IMPLANT
SUT SILK 0 FSL (SUTURE) ×16 IMPLANT
SUT SILK 2 0 SH CR/8 (SUTURE) ×8 IMPLANT
SUT SILK 2 0 TIES 10X30 (SUTURE) ×2 IMPLANT
SUT SILK 3 0 SH CR/8 (SUTURE) ×2 IMPLANT
SUT SILK 3 0 TIES 10X30 (SUTURE) ×2 IMPLANT
TAPE CLOTH SURG 4X10 WHT LF (GAUZE/BANDAGES/DRESSINGS) ×2 IMPLANT
TOWEL OR 17X26 10 PK STRL BLUE (TOWEL DISPOSABLE) ×2 IMPLANT
TRAY FOLEY CATH 16FRSI W/METER (SET/KITS/TRAYS/PACK) IMPLANT
TUBE CONNECTING 12X1/4 (SUCTIONS) IMPLANT
YANKAUER SUCT BULB TIP NO VENT (SUCTIONS) IMPLANT

## 2014-05-07 NOTE — Progress Notes (Signed)
[facility] Progress Note  4 Days Post-Op  Subjective: Dr. Rhona Leavens re-consulted [place] for a change in her status.  She has re-bled, Hgb was 9.0 this am, went down to 7.8 this am and now 6.8.  She is symptomatic, feeling dizzy upon standing, feels a bit SOB but attributes it to anxiety, and feels weak and run down.  Pt noted a bloody BM yesterday, none today.  +nausea but no vomiting.  Currently getting blood.  Wants to eat and to go home.   Objective: Vital signs in last 24 hours: Temp:  [98.2 F (36.8 C)-99 F (37.2 C)] 98.4 F (36.9 C) (03/08 1350) Pulse Rate:  [101-109] 104 (03/08 1350) Resp:  [13-27] 20 (03/08 1350) BP: (89-108)/(51-64) 108/63 mmHg (03/08 1350) SpO2:  [99 %-100 %] 100 % (03/08 1350) Weight:  [153 lb 12.8 oz (69.763 kg)] 153 lb 12.8 oz (69.763 kg) (03/08 0500) Last BM Date: 05/06/14  Intake/Output from previous day: 03/07 0701 - 03/08 0700 In: 1573.3 [P.O.:355; I.V.:1218.3] Out: 2350 [Urine:2350] Intake/Output this shift: Total I/O In: 1706.7 [P.O.:350; I.V.:1326.7; Blood:30] Out: 1400 [Urine:1400]  PE: Gen:  Alert, NAD, pleasant, getting blood Card:  RRR, no M/G/R heard Pulm:  CTA, no W/R/R, good effort, does not appear SOB at rest Abd: Soft, mild tenderness, ND, +BS, no HSM Ext:  No erythema, edema, or tenderness   Lab Results:   Recent Labs  05/06/14 0400 05/07/14 0500 05/07/14 1255  WBC 9.9 9.9  --   HGB 9.0* 7.8* 6.8*  HCT 26.0* 23.1* 20.2*  PLT 181 217  --    BMET  Recent Labs  05/06/14 1010 05/07/14 0500  NA 135 137  K 3.6 3.7  CL 108 109  CO2 23 24  GLUCOSE 85 102*  BUN 14 14  CREATININE 0.48* 0.53  CALCIUM 7.5* 7.7*   PT/INR No results for input(s): LABPROT, INR in the last 72 hours. CMP     Component Value Date/Time   NA 137 05/07/2014 0500   K 3.7 05/07/2014 0500   CL 109 05/07/2014 0500   CO2 24 05/07/2014 0500   GLUCOSE 102* 05/07/2014 0500   BUN 14 05/07/2014 0500   CREATININE 0.53 05/07/2014 0500   CALCIUM 7.7* 05/07/2014 0500   PROT 4.6* 05/04/2014 0518   ALBUMIN 2.3* 05/04/2014 0518   AST 13 05/04/2014 0518   ALT 11 05/04/2014 0518   ALKPHOS 59 05/04/2014 0518   BILITOT 0.5 05/04/2014 0518   GFRNONAA >90 05/07/2014 0500   GFRAA >90 05/07/2014 0500   Lipase     Component Value Date/Time   LIPASE 28 04/26/2014 2314       Studies/Results: No results found.  Anti-infectives: Anti-infectives    None     Events 2/26 Admit with syncope prior to presentation, concern for UGIB. Hgb 8.2 from 15 2/27 - EGD with massive cratered duodenal bulb ulcer 2/28- No further bleeding, pt seeking pain meds, getting out of bed without assistance 3/4 - Repeat EGD with large duodenal bulb ulcer w/ healing with large visible vessel 3/6 - Near syncopal episode w/ black stool , sharp drop in hbg and hypotension, Angio with no active bleed found 3/7 - Hg stable, transfer out of the ICU to tele 3/8 - Re-bleed to Hgb of 6.8   Assessment/Plan UGI bleeding Large duodenal ulcer Symptomatic anemia Hgb dropped to 6.8 -Recommend transferring to the unit - she needs close monitoring and vitals, Hgb q8h, continue protonix drip, carafate TID -IR has been following and  they attempted angio yesterday, 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 No extravasation was seen and empiric embolization could not be performed -GI last saw her on 04/2714 & 05/03/14, recommend repeat endoscopy -We re-explained to the patient that surgery should be the last resort. 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

## 2014-05-07 NOTE — Progress Notes (Signed)
Chaplain responded to consult.  Pt alone in room, lights off, laying on rt side.  Pt keeps Bible in bed and reads regularly.  Faith is important to pt.  Pt also shared that 05[birth year]14[birth year]14 is son's 10th birthday and would like to make goal of being well enough to be home for celebration but does not want to go home prematurely.  Pt has some understanding of her GI bleed and treatment but does not understand why something cannot be done and is requesting transfer to [facility] in hopes of finding source of bleed.  Chaplain provided emotional support as well as ministry of presence and empathetic listening.  Chaplain will follow up as needed.    05/07/14 0800  Clinical Encounter Type  Visited With Patient  Visit Type Follow-up;Spiritual support;Social support  Referral From Patient  Spiritual Encounters  Spiritual Needs Emotional  Stress Factors  Patient Stress Factors Exhausted;Family relationships;Health changes   Blain PaisOvercash, Tyquan Carmickle A, Chaplain 05/07/2014 8:28 AM

## 2014-05-07 NOTE — Progress Notes (Signed)
Pt with HR in 140s & SVT on morning tele strip. Informed provider Rhona Leavenshiu, pt to get 500 cc bolus and stat H & H. Will continue to monitor pt.

## 2014-05-07 NOTE — Progress Notes (Signed)
[facility] PROGRESS NOTE  Kimberly Weiss KXF:[id number] DOB: Jan 16, 1981 DOA: 04/26/2014 PCP: Sallee Lange, MD  Assessment/Plan: Acute hypotensive episode - Suspect secondary to acute blood loss anemia - See events from 3/6. Pt noted to have acute blood loss with hgb to 5.7, given 2 units prbc's, seen emergently by IR - Remains hypotensive this AM - See below  Acute blood loss anemia / UGIB due to Duodenal Ulcer -Pt is continued on protonix infusion -H. pylori serology negative  --Pt was already given total of 5 units PRBC thus far - This AM, pt noted to be tachycardic with HR into the 140's. STAT CBC with hgb down to 6.8 from over 9. - Discussed case with GI who recommends 2 units PRBC's, keep NPO. GI to see - Also discussed with IR who will review case. - For now, blood tx per above and follow h/h, transfuse as needed  Hypovolemic / hemorrhagic shock - hypotension  - Tachycardic this AM with mildly low sbp into the 80's - Have started 2 units PRBC transfusion - Discussed case with GI. Per above  Lactic Acidosis  -due to hypovolemic shock - resolved as of 3/6  Chronic Tobacco Abuse -Smoking cessation counseling  -PRN albuterol for wheezing   Hypothyroidism -Synthroid   Anxiety -Currently continued on clonazepam -Continue venlafaxine  - Will give a trial of buspar  Code Status: Full Family Communication: Pt in room Disposition Plan: Pending   Consultants:  GI  Critical Care  IR  Procedures:  Angio 3/6  With no active bleed found  EGD 2/27 with massive cratered duodenal bulb ulcer, repeat EGD 3/4 with large duodenal bulb ulcer w/ healing with large visible vessel  Antibiotics:    HPI/Subjective: Very anxious and complains of diffuse pain.  Objective: Filed Vitals:   05/07/14 0647 05/07/14 1259 05/07/14 1320 05/07/14 1350  BP: '89/53 99/57 99/57 ' 108/63  Pulse:  103 109 104  Temp: 98.2 F (36.8 C) 99 F (37.2 C) 98.3 F (36.8 C) 98.4 F  (36.9 C)  TempSrc: Oral Oral Oral Oral  Resp: '13 20 20 20  ' Height:      Weight:      SpO2: 100% 100% 100% 100%    Intake/Output Summary (Last 24 hours) at 05/07/14 1512 Last data filed at 05/07/14 1335  Gross per 24 hour  Intake   3145 ml  Output   2950 ml  Net    195 ml   Filed Weights   05/06/14 0500 05/06/14 2102 05/07/14 0500  Weight: 70.806 kg (156 lb 1.6 oz) 69.763 kg (153 lb 12.8 oz) 69.763 kg (153 lb 12.8 oz)    Exam:   General:  Awake, in nad  Cardiovascular: regular, s1, s2  Respiratory: normal resp effort, no wheezing  Abdomen: soft, nondistended  Musculoskeletal: perfused, no clubbing   Data Reviewed: Basic Metabolic Panel:  Recent Labs Lab 05/02/14 0100 05/02/14 1442 05/04/14 0518 05/05/14 1600 05/05/14 1800 05/06/14 1010 05/07/14 0500  NA 136  --  138 138  --  135 137  K 3.2* 3.7 3.7 6.6* 3.6 3.6 3.7  CL 112  --  107 108  --  108 109  CO2 21  --  27 24  --  23 24  GLUCOSE 139*  --  93 136*  --  85 102*  BUN 14  --  <5* 12  --  14 14  CREATININE 0.58  --  0.58 0.58  --  0.48* 0.53  CALCIUM 7.4*  --  8.1* 6.9*  --  7.5* 7.7*  MG  --  1.6  --   --  1.8  --  2.0  PHOS  --   --   --   --   --   --  2.8   Liver Function Tests:  Recent Labs Lab 05/04/14 0518  AST 13  ALT 11  ALKPHOS 59  BILITOT 0.5  PROT 4.6*  ALBUMIN 2.3*   No results for input(s): LIPASE, AMYLASE in the last 168 hours. No results for input(s): AMMONIA in the last 168 hours. CBC:  Recent Labs Lab 05/05/14 0058 05/05/14 0948 05/05/14 1600 05/06/14 0400 05/07/14 0500 05/07/14 1255  WBC 11.5* 6.2 7.8 9.9 9.9  --   HGB 8.8* 5.7* 9.9* 9.0* 7.8* 6.8*  HCT 25.1* 17.2* 29.5* 26.0* 23.1* 20.2*  MCV 84.2 85.6 87.0 84.7 86.2  --   PLT 185 187 190 181 217  --    Cardiac Enzymes: No results for input(s): CKTOTAL, CKMB, CKMBINDEX, TROPONINI in the last 168 hours. BNP (last 3 results) No results for input(s): BNP in the last 8760 hours.  ProBNP (last 3 results) No  results for input(s): PROBNP in the last 8760 hours.  CBG: No results for input(s): GLUCAP in the last 168 hours.  No results found for this or any previous visit (from the past 240 hour(s)).   Studies: Ir Angiogram Visceral Selective  05/05/2014   INDICATION: 34 year old female admitted to the hospital with upper GI bleeding and hematemesis.  She was admitted April 26, 2013. Upper endoscopy demonstrates duodenum ulcer.  She has been stable over 1 week, though developed syncopal episode and acute anemia with a hemoglobin of 5.7 on today's date. She has been referred for evaluation and treatment of the duodenum ulcer hemorrhage.  EXAM: 1. AORTIC ANGIOGRAM 2. SUPERIOR MESENTERIC ARTERIOGRAM 3. SUB SELECTION OF FOUR TERTIARY BRANCHES FROM THE SUPERIOR MESENTERIC ARTERY 4. ULTRASOUND GUIDED VASCULAR ACCESS OF THE RIGHT COMMON FEMORAL ARTERY 5. MANUAL PRESSURE FOR HEMOSTASIS.  COMPARISON:  CT abdomen pelvis, 04/27/2013, 09/22/2001  MEDICATIONS: Versed 2.0 mg IV; Fentanyl 100 mcg IV  CONTRAST:  140m OMNIPAQUE IOHEXOL 300 MG/ML  SOLN  ANESTHESIA/SEDATION: One hundred fifty minutes  FLUOROSCOPY TIME:  Forty-one minutes.  Fifty-four seconds.  2941 mGy  ACCESS: Right common femoral artery; hemostasis achieved with manual compression.  COMPLICATIONS: None immediate  TECHNIQUE: Informed written consent was obtained from the patient after a discussion of the risks, benefits and alternatives to treatment. Questions regarding the procedure were encouraged and answered. A timeout was performed prior to the initiation of the procedure.  The right groin was prepped and draped in the usual sterile fashion, and a sterile drape was applied covering the operative field. Maximum barrier sterile technique with sterile gowns and gloves were used for the procedure. A timeout was performed prior to the initiation of the procedure. Local anesthesia was provided with 1% lidocaine.  Physical exam was performed to identify the  location of the right common femoral artery. Ultrasound survey was performed with images stored sent to PACs.  Once the common femoral artery was identified under ultrasound, ultrasound guidance was used to generously infiltrate the skin and subcutaneous tissues with 1% lidocaine without epinephrine. A micropuncture access was then used to puncture the right common femoral artery. With excellent blood flow returned, and micro wire was advanced through the needle observed under fluoroscopy to enter the iliac system. The needle was removed from wire a micropuncture kit was advanced over the wire.  The inner dilator and cannula were removed with a wire, and an 035 Bentson wire was advanced into the abdominal aorta under fluoroscopy the micro puncture was removed, and a 5 French sheath was advanced over the Bentson wire into the common femoral artery. The dilator was removed and the sheath was flushed.  A pigtail Flush catheter was advanced over the Bentson wire into the lower thoracic aorta. Aortic flush angiogram performed.  Pigtail catheter was then exchanged over wire for C2 Cobra catheter which was used to select the superior mesenteric artery. Superior mesenteric artery angiogram was performed.  Subsequently, a combination of the micro catheter a micro wire were used to select various branches of superior mesenteric artery in this patient with a celiacomesenteric trunk. A standard renegade micro catheter as well as a soft synchro an a standard .016 synchro wire were used to select branches of the superior mesenteric artery. Sub selection included:  Replaced common hepatic artery.  Right hepatic artery.  New middle colicky artery.  Proximal jejunal branches.  Two base catheter exchanges were made on the Bentson wire including exchange for Mickelson catheter and exchanged for a C2 Cobra catheter for formation of a Waltman loop.  After dedicated angiogram of each of the sub selected arteries, all catheters and wires  were removed.  Angiogram through the sheath was performed with the common femoral artery.  Deployment of an Exoseal device for hemostasis failed without deployment. Manual pressure was used for hemostasis of the right common femoral artery.  Patient tolerated procedure well and remained hemodynamically stable throughout. At the end of the procedure, the patient's vital signs were within normal range. Systolic brought pressure 110. Heart rate of 80-90. No syncopal events, no altered mental status, no hematemesis, and no hematochezia/melena.  Marland Kitchen  FINDINGS: Aortic angiogram:  Normal course caliber and contour of the abdominal aorta. Multiple segmental vessels fill within the lower thoracic spine and lumbar spine. No aneurysm dissection flap identified. No significant atherosclerotic changes.  Steep angled take-off of the bilateral common iliac arteries.  Single bilateral renal arteries.  No celiac artery origin.  Superior mesenteric artery patent, with configuration of a celiacomesenteric trunk.  Late phase of the aortic rind demonstrates no evidence of bleeding.  Celiacomesenteric angiogram:  Normal caliber and contour of the origin of the trunk. Multiple jejunal branches present. Normal caliber and contour of the ileocolic artery. Multiple tortuous vessels overlie the proximal 3rd of the trunk, with evidence of a replaced common hepatic artery contributing to both right and left hepatic arteries. There is a replaced vessel representing a celiac vessel, with cross-filling at a bifurcation to the splenic artery and the hepatic arteries. Patent left gastric artery. New  Late phase of the trunk injection demonstrates no evidence of bleeding. No tumor blush or pooling of contrast. Patent portal venous system identified.  Common hepatic artery injection:  Replaced common hepatic artery with right hepatic arteries and left hepatic arteries from the replaced trunk. No evidence of tumor blush, aneurysm, dissection flap,  pulling of contrast. No abnormal enhancement identified. No pseudoaneurysm identified.  No call it artery injection:  Patent middle call it artery of normal caliber. Significant cross flow from collateral vessels with no pooling of contrast, pseudoaneurysm, or tumor blush.  Proximal jejunal arcade:  Normal course caliber and contour. Normal blush of the proximal jejunal mucosa. No filling defects, or pseudoaneurysm.  No embolization was performed.  IMPRESSION: Status post diagnostic mesenteric angiogram.  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  No bleeding was identified from the replaced common hepatic artery, which would be the best candidate for supplying the pancreaticoduodenal arteries near the known proximal duodenum ulcer. Because no bleeding was identified of this variant anatomy, no empiric embolization was performed.  Signed,  Dulcy Fanny. Earleen Newport, DO  [facility]  [facility]  PLAN: The patient will require 6 hours flattened bed with log roll only for hemostasis of the right common femoral artery after manual compression.  Agree with ICU management, including serial hemoglobin and hematocrit checks.   Electronically Signed   By: Corrie Mckusick D.O.   On: 05/05/2014 16:37   Ir Angiogram Selective Each Additional Vessel  05/05/2014   INDICATION: 34 year old female admitted to the hospital with upper GI bleeding and hematemesis.  She was admitted April 26, 2013. Upper endoscopy demonstrates duodenum ulcer.  She has been stable over 1 week, though developed syncopal episode and acute anemia with a hemoglobin of 5.7 on today's date. She has been referred for evaluation and treatment of the duodenum ulcer hemorrhage.  EXAM: 1. AORTIC ANGIOGRAM 2. SUPERIOR MESENTERIC ARTERIOGRAM 3. SUB SELECTION OF FOUR TERTIARY BRANCHES FROM THE SUPERIOR MESENTERIC ARTERY 4. ULTRASOUND GUIDED VASCULAR ACCESS  OF THE RIGHT COMMON FEMORAL ARTERY 5. MANUAL PRESSURE FOR HEMOSTASIS.  COMPARISON:  CT abdomen pelvis, 04/27/2013, 09/22/2001  MEDICATIONS: Versed 2.0 mg IV; Fentanyl 100 mcg IV  CONTRAST:  115m OMNIPAQUE IOHEXOL 300 MG/ML  SOLN  ANESTHESIA/SEDATION: One hundred fifty minutes  FLUOROSCOPY TIME:  Forty-one minutes.  Fifty-four seconds.  2941 mGy  ACCESS: Right common femoral artery; hemostasis achieved with manual compression.  COMPLICATIONS: None immediate  TECHNIQUE: Informed written consent was obtained from the patient after a discussion of the risks, benefits and alternatives to treatment. Questions regarding the procedure were encouraged and answered. A timeout was performed prior to the initiation of the procedure.  The right groin was prepped and draped in the usual sterile fashion, and a sterile drape was applied covering the operative field. Maximum barrier sterile technique with sterile gowns and gloves were used for the procedure. A timeout was performed prior to the initiation of the procedure. Local anesthesia was provided with 1% lidocaine.  Physical exam was performed to identify the location of the right common femoral artery. Ultrasound survey was performed with images stored sent to PACs.  Once the common femoral artery was identified under ultrasound, ultrasound guidance was used to generously infiltrate the skin and subcutaneous tissues with 1% lidocaine without epinephrine. A micropuncture access was then used to puncture the right common femoral artery. With excellent blood flow returned, and micro wire was advanced through the needle observed under fluoroscopy to enter the iliac system. The needle was removed from wire a micropuncture kit was advanced over the wire. The inner dilator and cannula were removed with a wire, and an 035 Bentson wire was advanced into the abdominal aorta under fluoroscopy the micro puncture was removed, and a 5 French sheath was advanced over the Bentson wire into  the common femoral artery. The dilator was removed and the sheath was flushed.  A pigtail Flush catheter was advanced over the Bentson wire into the lower thoracic aorta. Aortic flush angiogram performed.  Pigtail catheter was then exchanged over wire for C2 Cobra catheter which was used to select the superior mesenteric artery. Superior mesenteric artery angiogram was performed.  Subsequently, a combination of the micro catheter a micro wire were used to select various branches  of superior mesenteric artery in this patient with a celiacomesenteric trunk. A standard renegade micro catheter as well as a soft synchro an a standard .016 synchro wire were used to select branches of the superior mesenteric artery. Sub selection included:  Replaced common hepatic artery.  Right hepatic artery.  New middle colicky artery.  Proximal jejunal branches.  Two base catheter exchanges were made on the Bentson wire including exchange for Mickelson catheter and exchanged for a C2 Cobra catheter for formation of a Waltman loop.  After dedicated angiogram of each of the sub selected arteries, all catheters and wires were removed.  Angiogram through the sheath was performed with the common femoral artery.  Deployment of an Exoseal device for hemostasis failed without deployment. Manual pressure was used for hemostasis of the right common femoral artery.  Patient tolerated procedure well and remained hemodynamically stable throughout. At the end of the procedure, the patient's vital signs were within normal range. Systolic brought pressure 110. Heart rate of 80-90. No syncopal events, no altered mental status, no hematemesis, and no hematochezia/melena.  Marland Kitchen  FINDINGS: Aortic angiogram:  Normal course caliber and contour of the abdominal aorta. Multiple segmental vessels fill within the lower thoracic spine and lumbar spine. No aneurysm dissection flap identified. No significant atherosclerotic changes.  Steep angled take-off of the  bilateral common iliac arteries.  Single bilateral renal arteries.  No celiac artery origin.  Superior mesenteric artery patent, with configuration of a celiacomesenteric trunk.  Late phase of the aortic rind demonstrates no evidence of bleeding.  Celiacomesenteric angiogram:  Normal caliber and contour of the origin of the trunk. Multiple jejunal branches present. Normal caliber and contour of the ileocolic artery. Multiple tortuous vessels overlie the proximal 3rd of the trunk, with evidence of a replaced common hepatic artery contributing to both right and left hepatic arteries. There is a replaced vessel representing a celiac vessel, with cross-filling at a bifurcation to the splenic artery and the hepatic arteries. Patent left gastric artery. New  Late phase of the trunk injection demonstrates no evidence of bleeding. No tumor blush or pooling of contrast. Patent portal venous system identified.  Common hepatic artery injection:  Replaced common hepatic artery with right hepatic arteries and left hepatic arteries from the replaced trunk. No evidence of tumor blush, aneurysm, dissection flap, pulling of contrast. No abnormal enhancement identified. No pseudoaneurysm identified.  No call it artery injection:  Patent middle call it artery of normal caliber. Significant cross flow from collateral vessels with no pooling of contrast, pseudoaneurysm, or tumor blush.  Proximal jejunal arcade:  Normal course caliber and contour. Normal blush of the proximal jejunal mucosa. No filling defects, or pseudoaneurysm.  No embolization was performed.  IMPRESSION: Status post diagnostic mesenteric angiogram.  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  No bleeding was identified from the replaced common hepatic artery, which would be the best candidate for supplying the pancreaticoduodenal arteries near the known proximal duodenum ulcer. Because no bleeding  was identified of this variant anatomy, no empiric embolization was performed.  Signed,  Dulcy Fanny. Earleen Newport, DO  [facility]  [facility]  PLAN: The patient will require 6 hours flattened bed with log roll only for hemostasis of the right common femoral artery after manual compression.  Agree with ICU management, including serial hemoglobin and hematocrit checks.   Electronically Signed   By: Corrie Mckusick D.O.   On: 05/05/2014 16:37  Ir Angiogram Selective Each Additional Vessel  05/05/2014   INDICATION: 34 year old female admitted to the hospital with upper GI bleeding and hematemesis.  She was admitted April 26, 2013. Upper endoscopy demonstrates duodenum ulcer.  She has been stable over 1 week, though developed syncopal episode and acute anemia with a hemoglobin of 5.7 on today's date. She has been referred for evaluation and treatment of the duodenum ulcer hemorrhage.  EXAM: 1. AORTIC ANGIOGRAM 2. SUPERIOR MESENTERIC ARTERIOGRAM 3. SUB SELECTION OF FOUR TERTIARY BRANCHES FROM THE SUPERIOR MESENTERIC ARTERY 4. ULTRASOUND GUIDED VASCULAR ACCESS OF THE RIGHT COMMON FEMORAL ARTERY 5. MANUAL PRESSURE FOR HEMOSTASIS.  COMPARISON:  CT abdomen pelvis, 04/27/2013, 09/22/2001  MEDICATIONS: Versed 2.0 mg IV; Fentanyl 100 mcg IV  CONTRAST:  14m OMNIPAQUE IOHEXOL 300 MG/ML  SOLN  ANESTHESIA/SEDATION: One hundred fifty minutes  FLUOROSCOPY TIME:  Forty-one minutes.  Fifty-four seconds.  2941 mGy  ACCESS: Right common femoral artery; hemostasis achieved with manual compression.  COMPLICATIONS: None immediate  TECHNIQUE: Informed written consent was obtained from the patient after a discussion of the risks, benefits and alternatives to treatment. Questions regarding the procedure were encouraged and answered. A timeout was performed prior to the initiation of the procedure.  The right groin was prepped and draped in the usual sterile fashion, and a sterile drape was applied  covering the operative field. Maximum barrier sterile technique with sterile gowns and gloves were used for the procedure. A timeout was performed prior to the initiation of the procedure. Local anesthesia was provided with 1% lidocaine.  Physical exam was performed to identify the location of the right common femoral artery. Ultrasound survey was performed with images stored sent to PACs.  Once the common femoral artery was identified under ultrasound, ultrasound guidance was used to generously infiltrate the skin and subcutaneous tissues with 1% lidocaine without epinephrine. A micropuncture access was then used to puncture the right common femoral artery. With excellent blood flow returned, and micro wire was advanced through the needle observed under fluoroscopy to enter the iliac system. The needle was removed from wire a micropuncture kit was advanced over the wire. The inner dilator and cannula were removed with a wire, and an 035 Bentson wire was advanced into the abdominal aorta under fluoroscopy the micro puncture was removed, and a 5 French sheath was advanced over the Bentson wire into the common femoral artery. The dilator was removed and the sheath was flushed.  A pigtail Flush catheter was advanced over the Bentson wire into the lower thoracic aorta. Aortic flush angiogram performed.  Pigtail catheter was then exchanged over wire for C2 Cobra catheter which was used to select the superior mesenteric artery. Superior mesenteric artery angiogram was performed.  Subsequently, a combination of the micro catheter a micro wire were used to select various branches of superior mesenteric artery in this patient with a celiacomesenteric trunk. A standard renegade micro catheter as well as a soft synchro an a standard .016 synchro wire were used to select branches of the superior mesenteric artery. Sub selection included:  Replaced common hepatic artery.  Right hepatic artery.  New middle colicky artery.   Proximal jejunal branches.  Two base catheter exchanges were made on the Bentson wire including exchange for Mickelson catheter and exchanged for a C2 Cobra catheter for formation of a Waltman loop.  After dedicated angiogram of each of the sub selected arteries, all catheters and wires were removed.  Angiogram through the sheath was performed with the common femoral artery.  Deployment of an Exoseal device for hemostasis failed without deployment. Manual pressure was used for hemostasis of the right common femoral artery.  Patient tolerated procedure well and remained hemodynamically stable throughout. At the end of the procedure, the patient's vital signs were within normal range. Systolic brought pressure 110. Heart rate of 80-90. No syncopal events, no altered mental status, no hematemesis, and no hematochezia/melena.  Marland Kitchen  FINDINGS: Aortic angiogram:  Normal course caliber and contour of the abdominal aorta. Multiple segmental vessels fill within the lower thoracic spine and lumbar spine. No aneurysm dissection flap identified. No significant atherosclerotic changes.  Steep angled take-off of the bilateral common iliac arteries.  Single bilateral renal arteries.  No celiac artery origin.  Superior mesenteric artery patent, with configuration of a celiacomesenteric trunk.  Late phase of the aortic rind demonstrates no evidence of bleeding.  Celiacomesenteric angiogram:  Normal caliber and contour of the origin of the trunk. Multiple jejunal branches present. Normal caliber and contour of the ileocolic artery. Multiple tortuous vessels overlie the proximal 3rd of the trunk, with evidence of a replaced common hepatic artery contributing to both right and left hepatic arteries. There is a replaced vessel representing a celiac vessel, with cross-filling at a bifurcation to the splenic artery and the hepatic arteries. Patent left gastric artery. New  Late phase of the trunk injection demonstrates no evidence of  bleeding. No tumor blush or pooling of contrast. Patent portal venous system identified.  Common hepatic artery injection:  Replaced common hepatic artery with right hepatic arteries and left hepatic arteries from the replaced trunk. No evidence of tumor blush, aneurysm, dissection flap, pulling of contrast. No abnormal enhancement identified. No pseudoaneurysm identified.  No call it artery injection:  Patent middle call it artery of normal caliber. Significant cross flow from collateral vessels with no pooling of contrast, pseudoaneurysm, or tumor blush.  Proximal jejunal arcade:  Normal course caliber and contour. Normal blush of the proximal jejunal mucosa. No filling defects, or pseudoaneurysm.  No embolization was performed.  IMPRESSION: Status post diagnostic mesenteric angiogram.  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  No bleeding was identified from the replaced common hepatic artery, which would be the best candidate for supplying the pancreaticoduodenal arteries near the known proximal duodenum ulcer. Because no bleeding was identified of this variant anatomy, no empiric embolization was performed.  Signed,  Dulcy Fanny. Earleen Newport, DO  [facility]  [facility]  PLAN: The patient will require 6 hours flattened bed with log roll only for hemostasis of the right common femoral artery after manual compression.  Agree with ICU management, including serial hemoglobin and hematocrit checks.   Electronically Signed   By: Corrie Mckusick D.O.   On: 05/05/2014 16:37   Ir Angiogram Selective Each Additional Vessel  05/05/2014   INDICATION: 34 year old female admitted to the hospital with upper GI bleeding and hematemesis.  She was admitted April 26, 2013. Upper endoscopy demonstrates duodenum ulcer.  She has been stable over 1 week, though developed syncopal episode and acute anemia with a  hemoglobin of 5.7 on today's date. She has been referred for evaluation and treatment of the duodenum ulcer hemorrhage.  EXAM: 1. AORTIC ANGIOGRAM 2. SUPERIOR MESENTERIC ARTERIOGRAM 3. SUB SELECTION OF FOUR TERTIARY BRANCHES FROM THE SUPERIOR MESENTERIC ARTERY 4. ULTRASOUND GUIDED VASCULAR ACCESS OF THE RIGHT COMMON FEMORAL ARTERY 5. MANUAL PRESSURE FOR HEMOSTASIS.  COMPARISON:  CT abdomen pelvis, 04/27/2013, 09/22/2001  MEDICATIONS:  Versed 2.0 mg IV; Fentanyl 100 mcg IV  CONTRAST:  17m OMNIPAQUE IOHEXOL 300 MG/ML  SOLN  ANESTHESIA/SEDATION: One hundred fifty minutes  FLUOROSCOPY TIME:  Forty-one minutes.  Fifty-four seconds.  2941 mGy  ACCESS: Right common femoral artery; hemostasis achieved with manual compression.  COMPLICATIONS: None immediate  TECHNIQUE: Informed written consent was obtained from the patient after a discussion of the risks, benefits and alternatives to treatment. Questions regarding the procedure were encouraged and answered. A timeout was performed prior to the initiation of the procedure.  The right groin was prepped and draped in the usual sterile fashion, and a sterile drape was applied covering the operative field. Maximum barrier sterile technique with sterile gowns and gloves were used for the procedure. A timeout was performed prior to the initiation of the procedure. Local anesthesia was provided with 1% lidocaine.  Physical exam was performed to identify the location of the right common femoral artery. Ultrasound survey was performed with images stored sent to PACs.  Once the common femoral artery was identified under ultrasound, ultrasound guidance was used to generously infiltrate the skin and subcutaneous tissues with 1% lidocaine without epinephrine. A micropuncture access was then used to puncture the right common femoral artery. With excellent blood flow returned, and micro wire was advanced through the needle observed under fluoroscopy to enter the iliac system. The needle  was removed from wire a micropuncture kit was advanced over the wire. The inner dilator and cannula were removed with a wire, and an 035 Bentson wire was advanced into the abdominal aorta under fluoroscopy the micro puncture was removed, and a 5 French sheath was advanced over the Bentson wire into the common femoral artery. The dilator was removed and the sheath was flushed.  A pigtail Flush catheter was advanced over the Bentson wire into the lower thoracic aorta. Aortic flush angiogram performed.  Pigtail catheter was then exchanged over wire for C2 Cobra catheter which was used to select the superior mesenteric artery. Superior mesenteric artery angiogram was performed.  Subsequently, a combination of the micro catheter a micro wire were used to select various branches of superior mesenteric artery in this patient with a celiacomesenteric trunk. A standard renegade micro catheter as well as a soft synchro an a standard .016 synchro wire were used to select branches of the superior mesenteric artery. Sub selection included:  Replaced common hepatic artery.  Right hepatic artery.  New middle colicky artery.  Proximal jejunal branches.  Two base catheter exchanges were made on the Bentson wire including exchange for Mickelson catheter and exchanged for a C2 Cobra catheter for formation of a Waltman loop.  After dedicated angiogram of each of the sub selected arteries, all catheters and wires were removed.  Angiogram through the sheath was performed with the common femoral artery.  Deployment of an Exoseal device for hemostasis failed without deployment. Manual pressure was used for hemostasis of the right common femoral artery.  Patient tolerated procedure well and remained hemodynamically stable throughout. At the end of the procedure, the patient's vital signs were within normal range. Systolic brought pressure 110. Heart rate of 80-90. No syncopal events, no altered mental status, no hematemesis, and no  hematochezia/melena.  .Marland Kitchen FINDINGS: Aortic angiogram:  Normal course caliber and contour of the abdominal aorta. Multiple segmental vessels fill within the lower thoracic spine and lumbar spine. No aneurysm dissection flap identified. No significant atherosclerotic changes.  Steep angled take-off of the bilateral common iliac arteries.  Single bilateral renal  arteries.  No celiac artery origin.  Superior mesenteric artery patent, with configuration of a celiacomesenteric trunk.  Late phase of the aortic rind demonstrates no evidence of bleeding.  Celiacomesenteric angiogram:  Normal caliber and contour of the origin of the trunk. Multiple jejunal branches present. Normal caliber and contour of the ileocolic artery. Multiple tortuous vessels overlie the proximal 3rd of the trunk, with evidence of a replaced common hepatic artery contributing to both right and left hepatic arteries. There is a replaced vessel representing a celiac vessel, with cross-filling at a bifurcation to the splenic artery and the hepatic arteries. Patent left gastric artery. New  Late phase of the trunk injection demonstrates no evidence of bleeding. No tumor blush or pooling of contrast. Patent portal venous system identified.  Common hepatic artery injection:  Replaced common hepatic artery with right hepatic arteries and left hepatic arteries from the replaced trunk. No evidence of tumor blush, aneurysm, dissection flap, pulling of contrast. No abnormal enhancement identified. No pseudoaneurysm identified.  No call it artery injection:  Patent middle call it artery of normal caliber. Significant cross flow from collateral vessels with no pooling of contrast, pseudoaneurysm, or tumor blush.  Proximal jejunal arcade:  Normal course caliber and contour. Normal blush of the proximal jejunal mucosa. No filling defects, or pseudoaneurysm.  No embolization was performed.  IMPRESSION: Status post diagnostic mesenteric angiogram.  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  No bleeding was identified from the replaced common hepatic artery, which would be the best candidate for supplying the pancreaticoduodenal arteries near the known proximal duodenum ulcer. Because no bleeding was identified of this variant anatomy, no empiric embolization was performed.  Signed,  Dulcy Fanny. Earleen Newport, DO  [facility]  [facility]  PLAN: The patient will require 6 hours flattened bed with log roll only for hemostasis of the right common femoral artery after manual compression.  Agree with ICU management, including serial hemoglobin and hematocrit checks.   Electronically Signed   By: Corrie Mckusick D.O.   On: 05/05/2014 16:37   Ir Angiogram Selective Each Additional Vessel  05/05/2014   INDICATION: 34 year old female admitted to the hospital with upper GI bleeding and hematemesis.  She was admitted April 26, 2013. Upper endoscopy demonstrates duodenum ulcer.  She has been stable over 1 week, though developed syncopal episode and acute anemia with a hemoglobin of 5.7 on today's date. She has been referred for evaluation and treatment of the duodenum ulcer hemorrhage.  EXAM: 1. AORTIC ANGIOGRAM 2. SUPERIOR MESENTERIC ARTERIOGRAM 3. SUB SELECTION OF FOUR TERTIARY BRANCHES FROM THE SUPERIOR MESENTERIC ARTERY 4. ULTRASOUND GUIDED VASCULAR ACCESS OF THE RIGHT COMMON FEMORAL ARTERY 5. MANUAL PRESSURE FOR HEMOSTASIS.  COMPARISON:  CT abdomen pelvis, 04/27/2013, 09/22/2001  MEDICATIONS: Versed 2.0 mg IV; Fentanyl 100 mcg IV  CONTRAST:  187m OMNIPAQUE IOHEXOL 300 MG/ML  SOLN  ANESTHESIA/SEDATION: One hundred fifty minutes  FLUOROSCOPY TIME:  Forty-one minutes.  Fifty-four seconds.  2941 mGy  ACCESS: Right common femoral artery; hemostasis achieved with manual compression.  COMPLICATIONS: None immediate  TECHNIQUE: Informed written consent was obtained from the patient  after a discussion of the risks, benefits and alternatives to treatment. Questions regarding the procedure were encouraged and answered. A timeout was performed prior to the initiation of the procedure.  The right groin was prepped and draped in the usual sterile fashion, and a sterile drape was applied covering the operative field. Maximum barrier sterile  technique with sterile gowns and gloves were used for the procedure. A timeout was performed prior to the initiation of the procedure. Local anesthesia was provided with 1% lidocaine.  Physical exam was performed to identify the location of the right common femoral artery. Ultrasound survey was performed with images stored sent to PACs.  Once the common femoral artery was identified under ultrasound, ultrasound guidance was used to generously infiltrate the skin and subcutaneous tissues with 1% lidocaine without epinephrine. A micropuncture access was then used to puncture the right common femoral artery. With excellent blood flow returned, and micro wire was advanced through the needle observed under fluoroscopy to enter the iliac system. The needle was removed from wire a micropuncture kit was advanced over the wire. The inner dilator and cannula were removed with a wire, and an 035 Bentson wire was advanced into the abdominal aorta under fluoroscopy the micro puncture was removed, and a 5 French sheath was advanced over the Bentson wire into the common femoral artery. The dilator was removed and the sheath was flushed.  A pigtail Flush catheter was advanced over the Bentson wire into the lower thoracic aorta. Aortic flush angiogram performed.  Pigtail catheter was then exchanged over wire for C2 Cobra catheter which was used to select the superior mesenteric artery. Superior mesenteric artery angiogram was performed.  Subsequently, a combination of the micro catheter a micro wire were used to select various branches of superior mesenteric artery in this patient  with a celiacomesenteric trunk. A standard renegade micro catheter as well as a soft synchro an a standard .016 synchro wire were used to select branches of the superior mesenteric artery. Sub selection included:  Replaced common hepatic artery.  Right hepatic artery.  New middle colicky artery.  Proximal jejunal branches.  Two base catheter exchanges were made on the Bentson wire including exchange for Mickelson catheter and exchanged for a C2 Cobra catheter for formation of a Waltman loop.  After dedicated angiogram of each of the sub selected arteries, all catheters and wires were removed.  Angiogram through the sheath was performed with the common femoral artery.  Deployment of an Exoseal device for hemostasis failed without deployment. Manual pressure was used for hemostasis of the right common femoral artery.  Patient tolerated procedure well and remained hemodynamically stable throughout. At the end of the procedure, the patient's vital signs were within normal range. Systolic brought pressure 110. Heart rate of 80-90. No syncopal events, no altered mental status, no hematemesis, and no hematochezia/melena.  Marland Kitchen  FINDINGS: Aortic angiogram:  Normal course caliber and contour of the abdominal aorta. Multiple segmental vessels fill within the lower thoracic spine and lumbar spine. No aneurysm dissection flap identified. No significant atherosclerotic changes.  Steep angled take-off of the bilateral common iliac arteries.  Single bilateral renal arteries.  No celiac artery origin.  Superior mesenteric artery patent, with configuration of a celiacomesenteric trunk.  Late phase of the aortic rind demonstrates no evidence of bleeding.  Celiacomesenteric angiogram:  Normal caliber and contour of the origin of the trunk. Multiple jejunal branches present. Normal caliber and contour of the ileocolic artery. Multiple tortuous vessels overlie the proximal 3rd of the trunk, with evidence of a replaced common hepatic artery  contributing to both right and left hepatic arteries. There is a replaced vessel representing a celiac vessel, with cross-filling at a bifurcation to the splenic artery and the hepatic arteries. Patent left gastric artery. New  Late phase of the trunk injection demonstrates no  evidence of bleeding. No tumor blush or pooling of contrast. Patent portal venous system identified.  Common hepatic artery injection:  Replaced common hepatic artery with right hepatic arteries and left hepatic arteries from the replaced trunk. No evidence of tumor blush, aneurysm, dissection flap, pulling of contrast. No abnormal enhancement identified. No pseudoaneurysm identified.  No call it artery injection:  Patent middle call it artery of normal caliber. Significant cross flow from collateral vessels with no pooling of contrast, pseudoaneurysm, or tumor blush.  Proximal jejunal arcade:  Normal course caliber and contour. Normal blush of the proximal jejunal mucosa. No filling defects, or pseudoaneurysm.  No embolization was performed.  IMPRESSION: Status post diagnostic mesenteric angiogram.  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  No bleeding was identified from the replaced common hepatic artery, which would be the best candidate for supplying the pancreaticoduodenal arteries near the known proximal duodenum ulcer. Because no bleeding was identified of this variant anatomy, no empiric embolization was performed.  Signed,  Dulcy Fanny. Earleen Newport, DO  [facility]  [facility]  PLAN: The patient will require 6 hours flattened bed with log roll only for hemostasis of the right common femoral artery after manual compression.  Agree with ICU management, including serial hemoglobin and hematocrit checks.   Electronically Signed   By: Corrie Mckusick D.O.   On: 05/05/2014 16:37   Ir US Guide Vasc Access Right  05/05/2014    INDICATION: 34 year old female admitted to the hospital with upper GI bleeding and hematemesis.  She was admitted April 26, 2013. Upper endoscopy demonstrates duodenum ulcer.  She has been stable over 1 week, though developed syncopal episode and acute anemia with a hemoglobin of 5.7 on today's date. She has been referred for evaluation and treatment of the duodenum ulcer hemorrhage.  EXAM: 1. AORTIC ANGIOGRAM 2. SUPERIOR MESENTERIC ARTERIOGRAM 3. SUB SELECTION OF FOUR TERTIARY BRANCHES FROM THE SUPERIOR MESENTERIC ARTERY 4. ULTRASOUND GUIDED VASCULAR ACCESS OF THE RIGHT COMMON FEMORAL ARTERY 5. MANUAL PRESSURE FOR HEMOSTASIS.  COMPARISON:  CT abdomen pelvis, 04/27/2013, 09/22/2001  MEDICATIONS: Versed 2.0 mg IV; Fentanyl 100 mcg IV  CONTRAST:  153m OMNIPAQUE IOHEXOL 300 MG/ML  SOLN  ANESTHESIA/SEDATION: One hundred fifty minutes  FLUOROSCOPY TIME:  Forty-one minutes.  Fifty-four seconds.  2941 mGy  ACCESS: Right common femoral artery; hemostasis achieved with manual compression.  COMPLICATIONS: None immediate  TECHNIQUE: Informed written consent was obtained from the patient after a discussion of the risks, benefits and alternatives to treatment. Questions regarding the procedure were encouraged and answered. A timeout was performed prior to the initiation of the procedure.  The right groin was prepped and draped in the usual sterile fashion, and a sterile drape was applied covering the operative field. Maximum barrier sterile technique with sterile gowns and gloves were used for the procedure. A timeout was performed prior to the initiation of the procedure. Local anesthesia was provided with 1% lidocaine.  Physical exam was performed to identify the location of the right common femoral artery. Ultrasound survey was performed with images stored sent to PACs.  Once the common femoral artery was identified under ultrasound, ultrasound guidance was used to generously infiltrate the skin and subcutaneous tissues  with 1% lidocaine without epinephrine. A micropuncture access was then used to puncture the right common femoral artery. With excellent blood flow returned, and micro wire was advanced through the needle observed under fluoroscopy to enter the iliac system. The needle  was removed from wire a micropuncture kit was advanced over the wire. The inner dilator and cannula were removed with a wire, and an 035 Bentson wire was advanced into the abdominal aorta under fluoroscopy the micro puncture was removed, and a 5 French sheath was advanced over the Bentson wire into the common femoral artery. The dilator was removed and the sheath was flushed.  A pigtail Flush catheter was advanced over the Bentson wire into the lower thoracic aorta. Aortic flush angiogram performed.  Pigtail catheter was then exchanged over wire for C2 Cobra catheter which was used to select the superior mesenteric artery. Superior mesenteric artery angiogram was performed.  Subsequently, a combination of the micro catheter a micro wire were used to select various branches of superior mesenteric artery in this patient with a celiacomesenteric trunk. A standard renegade micro catheter as well as a soft synchro an a standard .016 synchro wire were used to select branches of the superior mesenteric artery. Sub selection included:  Replaced common hepatic artery.  Right hepatic artery.  New middle colicky artery.  Proximal jejunal branches.  Two base catheter exchanges were made on the Bentson wire including exchange for Mickelson catheter and exchanged for a C2 Cobra catheter for formation of a Waltman loop.  After dedicated angiogram of each of the sub selected arteries, all catheters and wires were removed.  Angiogram through the sheath was performed with the common femoral artery.  Deployment of an Exoseal device for hemostasis failed without deployment. Manual pressure was used for hemostasis of the right common femoral artery.  Patient tolerated  procedure well and remained hemodynamically stable throughout. At the end of the procedure, the patient's vital signs were within normal range. Systolic brought pressure 110. Heart rate of 80-90. No syncopal events, no altered mental status, no hematemesis, and no hematochezia/melena.  Marland Kitchen  FINDINGS: Aortic angiogram:  Normal course caliber and contour of the abdominal aorta. Multiple segmental vessels fill within the lower thoracic spine and lumbar spine. No aneurysm dissection flap identified. No significant atherosclerotic changes.  Steep angled take-off of the bilateral common iliac arteries.  Single bilateral renal arteries.  No celiac artery origin.  Superior mesenteric artery patent, with configuration of a celiacomesenteric trunk.  Late phase of the aortic rind demonstrates no evidence of bleeding.  Celiacomesenteric angiogram:  Normal caliber and contour of the origin of the trunk. Multiple jejunal branches present. Normal caliber and contour of the ileocolic artery. Multiple tortuous vessels overlie the proximal 3rd of the trunk, with evidence of a replaced common hepatic artery contributing to both right and left hepatic arteries. There is a replaced vessel representing a celiac vessel, with cross-filling at a bifurcation to the splenic artery and the hepatic arteries. Patent left gastric artery. New  Late phase of the trunk injection demonstrates no evidence of bleeding. No tumor blush or pooling of contrast. Patent portal venous system identified.  Common hepatic artery injection:  Replaced common hepatic artery with right hepatic arteries and left hepatic arteries from the replaced trunk. No evidence of tumor blush, aneurysm, dissection flap, pulling of contrast. No abnormal enhancement identified. No pseudoaneurysm identified.  No call it artery injection:  Patent middle call it artery of normal caliber. Significant cross flow from collateral vessels with no pooling of contrast, pseudoaneurysm, or tumor  blush.  Proximal jejunal arcade:  Normal course caliber and contour. Normal blush of the proximal jejunal mucosa. No filling defects, or pseudoaneurysm.  No embolization was performed.  IMPRESSION: Status post diagnostic  mesenteric angiogram.  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  No bleeding was identified from the replaced common hepatic artery, which would be the best candidate for supplying the pancreaticoduodenal arteries near the known proximal duodenum ulcer. Because no bleeding was identified of this variant anatomy, no empiric embolization was performed.  Signed,  Dulcy Fanny. Earleen Newport, DO  [facility]  [facility]  PLAN: The patient will require 6 hours flattened bed with log roll only for hemostasis of the right common femoral artery after manual compression.  Agree with ICU management, including serial hemoglobin and hematocrit checks.   Electronically Signed   By: Corrie Mckusick D.O.   On: 05/05/2014 16:37    Scheduled Meds: . sodium chloride   Intravenous Once  . sodium chloride   Intravenous Once  . busPIRone  7.5 mg Oral BID  . feeding supplement (RESOURCE BREEZE)  1 Container Oral TID BM  . fluticasone  2 spray Each Nare Daily  . levothyroxine  25 mcg Intravenous Daily  . nicotine  14 mg Transdermal Daily  . sodium chloride  1 spray Each Nare BID  . sodium chloride  10-40 mL Intracatheter Q12H  . sucralfate  1 g Oral TID WC & HS   Continuous Infusions: . sodium chloride Stopped (05/07/14 1322)  . pantoprozole (PROTONIX) infusion 8 mg/hr (05/07/14 1405)    Active Problems:   UGIB (upper gastrointestinal bleed)   Acute blood loss anemia   Head trauma   Tachycardia   Other specified hypotension   Duodenal ulcer   Hypovolemic shock   Tobacco abuse   Other specified hypothyroidism   Anxiety state   GI bleed   Gastrointestinal hemorrhage associated with  duodenal ulcer   Kimberly Weiss, [place] [facility] Pager [phone]. If 7PM-7AM, please contact night-coverage at [email or web address], password [facility] 05/07/2014, 3:12 PM  LOS: 10 days

## 2014-05-07 NOTE — Anesthesia Procedure Notes (Signed)
Procedure Name: Intubation Date/Time: 05/07/2014 8:12 PM Performed by: Julianne RiceBILOTTA, Kimberly Montijo Z Pre-anesthesia Checklist: Patient identified, Patient being monitored, Timeout performed, Emergency Drugs available and Suction available Patient Re-evaluated:Patient Re-evaluated prior to inductionOxygen Delivery Method: Circle system utilized Preoxygenation: Pre-oxygenation with 100% oxygen Intubation Type: IV induction, Rapid sequence and Cricoid Pressure applied Laryngoscope Size: Mac and 3 Grade View: Grade I Tube type: Oral Tube size: 7.5 mm Number of attempts: 1 Airway Equipment and Method: Stylet Placement Confirmation: ETT inserted through vocal cords under direct vision,  breath sounds checked- equal and bilateral and positive ETCO2 Secured at: 22 cm Tube secured with: Tape Dental Injury: Teeth and Oropharynx as per pre-operative assessment

## 2014-05-07 NOTE — Anesthesia Preprocedure Evaluation (Addendum)
Anesthesia Evaluation  Patient identified by MRN, date of birth, ID band Patient awake    Reviewed: Allergy & Precautions, H&P , NPO status , Patient's Chart, lab work & pertinent test results  Airway Mallampati: I  TM Distance: >3 FB Neck ROM: Full    Dental no notable dental hx. (+) Teeth Intact, Dental Advisory Given   Pulmonary Current Smoker,  breath sounds clear to auscultation  Pulmonary exam normal       Cardiovascular negative cardio ROS  Rhythm:Regular Rate:Tachycardia     Neuro/Psych Anxiety negative neurological ROS  negative psych ROS   GI/Hepatic Neg liver ROS, PUD,   Endo/Other  Hypothyroidism   Renal/GU negative Renal ROS  negative genitourinary   Musculoskeletal   Abdominal   Peds  Hematology negative hematology ROS (+) anemia ,   Anesthesia Other Findings   Reproductive/Obstetrics negative OB ROS                            Anesthesia Physical Anesthesia Plan  ASA: II and emergent  Anesthesia Plan: General   Post-op Pain Management:    Induction: Intravenous, Rapid sequence and Cricoid pressure planned  Airway Management Planned: Oral ETT  Additional Equipment:   Intra-op Plan:   Post-operative Plan: Extubation in OR  Informed Consent: I have reviewed the patients History and Physical, chart, labs and discussed the procedure including the risks, benefits and alternatives for the proposed anesthesia with the patient or authorized representative who has indicated his/her understanding and acceptance.   Dental advisory given  Plan Discussed with: CRNA  Anesthesia Plan Comments:         Anesthesia Quick Evaluation

## 2014-05-07 NOTE — Progress Notes (Signed)
Pt stating pain 10/10 but is resting comfortably before receiving pain medication.

## 2014-05-07 NOTE — Transfer of Care (Signed)
Immediate Anesthesia Transfer of Care Note  Patient: Kimberly Weiss  Procedure(s) Performed: Procedure(s): EXPLORATORY LAPAROTOMY (N/A) OVERSOWE DUODENAL ULCER (N/A)  Patient Location: PACU  Anesthesia Type:General  Level of Consciousness: awake, alert  and oriented  Airway & Oxygen Therapy: Patient Spontanous Breathing and Patient connected to nasal cannula oxygen  Post-op Assessment: Report given to RN and Post -op Vital signs reviewed and stable  Post vital signs: Reviewed and stable  Last Vitals:  Filed Vitals:   05/07/14 1900  BP: 93/59  Pulse: 134  Temp:   Resp: 14    Complications: No apparent anesthesia complications

## 2014-05-07 NOTE — H&P (Signed)
Report called and patient transferred to 24M with charge RN and Rapid Response Team

## 2014-05-07 NOTE — Progress Notes (Signed)
CRITICAL VALUE ALERT  Critical value received:  hgb-6.8  Date of notification:  05/07/2014  Time of notification:  1420  Critical value read back:Yes.    Nurse who received alert:  Genice Rougeheresa Kerra Guilfoil,RN  MD notified (1st page): Rhona Leavenshiu  Time of first page:  1420  MD notified (2nd page):  Time of second page:  Responding MD:  Rhona Leavenshiu  Time MD responded: 1420  Pt receiving 2 units of PRBCS NOWP

## 2014-05-07 NOTE — Treatment Plan (Addendum)
Called to bedside. Patient vomited large amount of bright red blood with resultant hypotension with sbp into the low 80's and HR into the 140's. Pt currently given second unit of PRBC's. Fluids to wide open. Discussed case with Critical Care. Recs to transfer patient to Critical Care. Called GI on call, Dr. Dulce Sellarutlaw, who recommends calling General Surgery given known large ulcer with visible vessel that was noted to be not amenable to endoscopic tx on prior EGD. Discussed case with Dr. Janee Mornhompson with General Surgery who will see patient. Will follow. For now, will order another 2 units of PRBC's.

## 2014-05-07 NOTE — Progress Notes (Signed)
Patient ID: Toy CookeyKatherine N Liptak, female   DOB: 04/21/1980, 34 y.o.   MRN: [id number] Natalia LeatherwoodKatherine is known to our service. She has been hospitalized for several days with UGIB due to DU. My partner saw her around 1600 today as her Hb had dropped again. Around 1800 she vomited some bright red blood and her BP dropped. She has been transferred to ICU. On exam: A&O, Lungs CTA, CV tachy 130, ABD soft, NT. SBP 90's. GI was contacted and reportedly stated there is no role for endoscopy. In light of this recurrent hemorrhage, I recommend ex lap and oversewing of the ulcer. Procedure, risks, and benefits D/W her. She agrees. Continue 2u PRBC as planned. Violeta GelinasBurke Eliceo Gladu, MD, MPH, FACS Trauma: [phone] General Surgery: [phone]

## 2014-05-07 NOTE — Progress Notes (Signed)
PULMONARY / CRITICAL CARE MEDICINE   Name: Kimberly CookeyKatherine N Weiss MRN: [id number] DOB: 08/20/1980    ADMISSION DATE:  04/26/2014  PRIMARY SERVICE: PCCM  CHIEF COMPLAINT:  Syncope, hematemesis  BRIEF PATIENT DESCRIPTION: 34 y/o woman admitted 2/26 after syncopal episode.   Work up concerning for UGI bleed likely due to NSAID usage. PCCM consulted for ICU admission 2/26 .  Found to have large UGIB d/t duodenal ulcer on EGD  .Initially required 5 u PRBC , tx w/ PPI stable to TRH on 3/1 . Reconsulted 3/6 for  Hypotension/GI bleed. Pt transferred to ICU . IR eval for possible embolization .   SIGNIFICANT EVENTS / STUDIES:  2/26  Admit with syncope prior to presentation, concern for UGIB.  Hgb 8.2 from 15 2/28  No further bleeding, pt seeking pain meds, getting out of bed without assistance 2/28 Massive cratered duodenal bulb ulcer noted on EGD 3/6 Near syncopal episode w/ black stool , sharp drop in hbg and hypotension  3/7 Hg stable, transfer out of the ICU to tele 3/8 Hg unstable, transfer out of tele to the ICU  SUBJECTIVE:  Kimberly Weiss had a recurrent episode of hematemesis this afternoon and a drop in her hemoglobin to 6.8  VITAL SIGNS: Temp:  [97.8 F (36.6 C)-99 F (37.2 C)] 98.1 F (36.7 C) (03/08 1830) Pulse Rate:  [90-170] 134 (03/08 1900) Resp:  [13-20] 14 (03/08 1900) BP: (67-121)/(40-92) 93/59 mmHg (03/08 1900) SpO2:  [99 %-100 %] 100 % (03/08 1900) Weight:  [153 lb 12.8 oz (69.763 kg)] 153 lb 12.8 oz (69.763 kg) (03/08 0500)   INTAKE / OUTPUT: Intake/Output      03/08 0701 - 03/09 0700   P.O. 350   I.V. (mL/kg) 1326.7 (19)   Blood 308   Total Intake(mL/kg) 1984.7 (28.4)   Urine (mL/kg/hr) 1400 (1.6)   Stool    Total Output 1400   Net +584.7        PHYSICAL EXAMINATION: General: sitting up in bed Neuro:  Awake and alert, moving all four ext HEENT:  Dried blood on fact, NCAT, EOMi Neck: supple Cardiovascular:  Tachy, regular, no mgr Lungs:  CTA B Abdomen:  Soft,  hypoactive bowel sounds Musculoskeletal:  Normal bulk and tone Skin:  No rashes  LABS:  CBC  Recent Labs Lab 05/05/14 1600 05/06/14 0400 05/07/14 0500 05/07/14 1255  WBC 7.8 9.9 9.9  --   HGB 9.9* 9.0* 7.8* 6.8*  HCT 29.5* 26.0* 23.1* 20.2*  PLT 190 181 217  --    Coag's  Recent Labs Lab 05/04/14 0518  APTT 30  INR 1.04   BMET  Recent Labs Lab 05/05/14 1600 05/05/14 1800 05/06/14 1010 05/07/14 0500  NA 138  --  135 137  K 6.6* 3.6 3.6 3.7  CL 108  --  108 109  CO2 24  --  23 24  BUN 12  --  14 14  CREATININE 0.58  --  0.48* 0.53  GLUCOSE 136*  --  85 102*   Electrolytes  Recent Labs Lab 05/02/14 1442  05/05/14 1600 05/05/14 1800 05/06/14 1010 05/07/14 0500  CALCIUM  --   < > 6.9*  --  7.5* 7.7*  MG 1.6  --   --  1.8  --  2.0  PHOS  --   --   --   --   --  2.8  < > = values in this interval not displayed. Sepsis Markers  Recent Labs Lab 05/05/14 1211  LATICACIDVEN  0.94   Liver Enzymes  Recent Labs Lab 05/04/14 0518  AST 13  ALT 11  ALKPHOS 59  BILITOT 0.5  ALBUMIN 2.3*   Glucose No results for input(s): GLUCAP in the last 168 hours.  Imaging No results found. ASSESSMENT / PLAN: GASTROINTESTINAL A: Severe Upper GI Bleed - likely in setting of NSAID / ETOH usage.  Massive cratered duodenal bulb ulcer noted on EGD 3/6 Rebleed >to go to IR for embolization > unsuccessful 3/8 Rebleed> to OR STAT P:   Continue Protonix gtt GI surgery taking her to OR now NPO until cleared by GI surgery  PULMONARY A: Chronic Tobacco Abuse No acute issues P:   Smoking cessation PRN albuterol for wheezing  Pulmonary hygiene Nasal hygiene - flonase + saline spray   If returns from OR on ventilator will provide full vent support overnight  CARDIOVASCULAR A: R arm PICC 3/2 >> Hypovolemic shock -Hemorrhagic  P:   Tele Transfuse blood now Monitor hemodynamics post operatively  RENAL A: Chronic cystitis P:   Monitor BMET and  UOP Replace electrolytes as needed   HEMATOLOGIC A: Anemia - due to hemorrhage P:   Transfuse 2PRBC now in setting of massive upper bleed After surgery, Hgb transfusion goal < 7gm/dL  INFECTIOUS A: No active issues P:   Monitor fever curve / WBC  ENDOCRINE A: Hypothyroidism P:   Synthroid 25 mcg IV, transition back to PO once cleared for intake   NEUROLOGIC A: Anxiety BZD dependence Opiate dependence P:   Avoid  sedating rx if possible  Hold  elavil, klonopin, percocet 10, effexor for now, restart once able to take PO. Will need prn pain control post operatively Sedation needs if intubated> will use fentanyl gtt and titrate to RASS -1  My cc time 35 minutes  Heber Leland, MD [place] [facility] Pager: [phone] Cell: [phone] If no response, call [phone]

## 2014-05-07 NOTE — Progress Notes (Signed)
Chief Complaint: Chief Complaint  Patient presents with  . Hypotension    Referring Physician(s): Dr. Marylu Lund  History of Present Illness: Kimberly Weiss is a 34 y.o. female with acute UGI bleed secondary to a large duodenal ulcer.  Angiography on 05/05/14 by Dr. 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  No traditional celiac vascular anatomy with no evidence of a gastroduodenal artery.  No extravasation was seen and empiric embolization could not be performed.  Past Medical History  Diagnosis Date  . Renal disorder   . Interstitial cystitis   . Thyroid disease   . Hypothyroidism   . Renal stones   . Back pain     Past Surgical History  Procedure Laterality Date  . Esophagogastroduodenoscopy N/A 04/27/2014    Procedure: ESOPHAGOGASTRODUODENOSCOPY (EGD);  Surgeon: Lear Ng, MD;  Location: [facility] ENDOSCOPY;  Service: Endoscopy;  Laterality: N/A;  . Esophagogastroduodenoscopy (egd) with propofol N/A 05/03/2014    Procedure: ESOPHAGOGASTRODUODENOSCOPY (EGD) WITH PROPOFOL;  Surgeon: Lear Ng, MD;  Location: [place];  Service: Endoscopy;  Laterality: N/A;    Allergies: Gabapentin and Zofran  Medications: Prior to Admission medications   Medication Sig Start Date End Date Taking? Authorizing Provider  albuterol (PROVENTIL HFA;VENTOLIN HFA) 108 (90 BASE) MCG/ACT inhaler Inhale 2 puffs into the lungs every 6 (six) hours as needed for wheezing or shortness of breath.   Yes Historical Provider, MD  amitriptyline (ELAVIL) 50 MG tablet TAKE 1 TABLET (50 MG TOTAL) BY MOUTH AT BEDTIME. 03/04/14  Yes Kathyrn Drown, MD  Aspirin-Acetaminophen-Caffeine (EXCEDRIN PO) Take 2 tablets by mouth 2 (two) times daily as needed. For pain   Yes Historical Provider, MD  clonazePAM (KLONOPIN) 1 MG tablet 1/2-1 po BID prn panic attacks 04/01/14  Yes Nilda Simmer, NP  hydrochlorothiazide (HYDRODIURIL) 25 MG tablet Take 25 mg by  mouth daily.   Yes Historical Provider, MD  levothyroxine (SYNTHROID, LEVOTHROID) 50 MCG tablet TAKE 1 TABLET EVERY DAY 04/17/14  Yes Kathyrn Drown, MD  oxyCODONE-acetaminophen (PERCOCET) 10-325 MG per tablet Take 1 tablet by mouth every 6 (six) hours as needed for pain. 01/28/14  Yes Kathyrn Drown, MD  venlafaxine XR (EFFEXOR-XR) 150 MG 24 hr capsule Take 150 mg by mouth daily with breakfast.   Yes Historical Provider, MD  amitriptyline (ELAVIL) 50 MG tablet TAKE 1 TABLET (50 MG TOTAL) BY MOUTH AT BEDTIME. 04/01/14   Mikey Kirschner, MD  ranitidine (ZANTAC) 150 MG capsule Take 1 capsule (150 mg total) by mouth daily. Patient not taking: Reported on 04/26/2014 11/15/13   Montine Circle, PA-C  sucralfate (CARAFATE) 1 GM/10ML suspension Take 10 mLs (1 g total) by mouth 4 (four) times daily -  with meals and at bedtime. Patient not taking: Reported on 04/26/2014 11/15/13   Montine Circle, PA-C    History reviewed. No pertinent family history.  History   Social History  . Marital Status: Single    Spouse Name: N/A  . Number of Children: N/A  . Years of Education: N/A   Social History Main Topics  . Smoking status: Current Every Day Smoker  . Smokeless tobacco: Not on file  . Alcohol Use: Yes     Comment: rare occas  . Drug Use: No  . Sexual Activity: Not on file   Other Topics Concern  . None   Social History Narrative     Review of Systems  Vital Signs: BP 108/63 mmHg  Pulse 104  Temp(Src) 98.4 F (36.9 C) (Oral)  Resp 20  Ht _0  (1.6 m)  Wt 153 lb 12.8 oz (69.763 kg)  BMI 27.25 kg/m2  SpO2 100%  LMP 04/12/2014  Physical Exam  Imaging: Ct Head Wo Contrast  04/29/2014   CLINICAL DATA:  Fall. Hit left-sided head on dry air. Headaches. Neck pain.  EXAM: CT HEAD WITHOUT CONTRAST  CT CERVICAL SPINE WITHOUT CONTRAST  TECHNIQUE: Multidetector CT imaging of the head and cervical spine was performed following the standard protocol without intravenous contrast. Multiplanar CT  image reconstructions of the cervical spine were also generated.  COMPARISON:  None.  FINDINGS: CT HEAD FINDINGS  No acute cortical infarct, hemorrhage, or mass lesion ispresent. Ventricles are of normal size. No significant extra-axial fluid collection is present. The paranasal sinuses andmastoid air cells are clear. The osseous skull is intact.  CT CERVICAL SPINE FINDINGS  There is reversal of normal cervical lordosis which may reflect muscle spasm or patient positioning. The vertebral body heights are well preserved. The facet joints are all well aligned. The prevertebral soft tissue space is normal.  IMPRESSION: 1. No acute intracranial abnormalities. 2. Reversal of normal cervical lordosis which may reflect muscle spasm or patient positioning.   Electronically Signed   By: Kerby Moors M.D.   On: 04/29/2014 16:00   Ct Cervical Spine Wo Contrast  04/29/2014   CLINICAL DATA:  Fall. Hit left-sided head on dry air. Headaches. Neck pain.  EXAM: CT HEAD WITHOUT CONTRAST  CT CERVICAL SPINE WITHOUT CONTRAST  TECHNIQUE: Multidetector CT imaging of the head and cervical spine was performed following the standard protocol without intravenous contrast. Multiplanar CT image reconstructions of the cervical spine were also generated.  COMPARISON:  None.  FINDINGS: CT HEAD FINDINGS  No acute cortical infarct, hemorrhage, or mass lesion ispresent. Ventricles are of normal size. No significant extra-axial fluid collection is present. The paranasal sinuses andmastoid air cells are clear. The osseous skull is intact.  CT CERVICAL SPINE FINDINGS  There is reversal of normal cervical lordosis which may reflect muscle spasm or patient positioning. The vertebral body heights are well preserved. The facet joints are all well aligned. The prevertebral soft tissue space is normal.  IMPRESSION: 1. No acute intracranial abnormalities. 2. Reversal of normal cervical lordosis which may reflect muscle spasm or patient positioning.    Electronically Signed   By: Kerby Moors M.D.   On: 04/29/2014 16:00   Ir Angiogram Visceral Selective  05/05/2014   INDICATION: 34 year old female admitted to the hospital with upper GI bleeding and hematemesis.  She was admitted April 26, 2013. Upper endoscopy demonstrates duodenum ulcer.  She has been stable over 1 week, though developed syncopal episode and acute anemia with a hemoglobin of 5.7 on today's date. She has been referred for evaluation and treatment of the duodenum ulcer hemorrhage.  EXAM: 1. AORTIC ANGIOGRAM 2. SUPERIOR MESENTERIC ARTERIOGRAM 3. SUB SELECTION OF FOUR TERTIARY BRANCHES FROM THE SUPERIOR MESENTERIC ARTERY 4. ULTRASOUND GUIDED VASCULAR ACCESS OF THE RIGHT COMMON FEMORAL ARTERY 5. MANUAL PRESSURE FOR HEMOSTASIS.  COMPARISON:  CT abdomen pelvis, 04/27/2013, 09/22/2001  MEDICATIONS: Versed 2.0 mg IV; Fentanyl 100 mcg IV  CONTRAST:  138m OMNIPAQUE IOHEXOL 300 MG/ML  SOLN  ANESTHESIA/SEDATION: One hundred fifty minutes  FLUOROSCOPY TIME:  Forty-one minutes.  Fifty-four seconds.  2941 mGy  ACCESS: Right common femoral artery; hemostasis achieved with manual compression.  COMPLICATIONS: None immediate  TECHNIQUE: Informed written consent was obtained from the patient after  a discussion of the risks, benefits and alternatives to treatment. Questions regarding the procedure were encouraged and answered. A timeout was performed prior to the initiation of the procedure.  The right groin was prepped and draped in the usual sterile fashion, and a sterile drape was applied covering the operative field. Maximum barrier sterile technique with sterile gowns and gloves were used for the procedure. A timeout was performed prior to the initiation of the procedure. Local anesthesia was provided with 1% lidocaine.  Physical exam was performed to identify the location of the right common femoral artery. Ultrasound survey was performed with images stored sent to PACs.  Once the common femoral artery  was identified under ultrasound, ultrasound guidance was used to generously infiltrate the skin and subcutaneous tissues with 1% lidocaine without epinephrine. A micropuncture access was then used to puncture the right common femoral artery. With excellent blood flow returned, and micro wire was advanced through the needle observed under fluoroscopy to enter the iliac system. The needle was removed from wire a micropuncture kit was advanced over the wire. The inner dilator and cannula were removed with a wire, and an 035 Bentson wire was advanced into the abdominal aorta under fluoroscopy the micro puncture was removed, and a 5 French sheath was advanced over the Bentson wire into the common femoral artery. The dilator was removed and the sheath was flushed.  A pigtail Flush catheter was advanced over the Bentson wire into the lower thoracic aorta. Aortic flush angiogram performed.  Pigtail catheter was then exchanged over wire for C2 Cobra catheter which was used to select the superior mesenteric artery. Superior mesenteric artery angiogram was performed.  Subsequently, a combination of the micro catheter a micro wire were used to select various branches of superior mesenteric artery in this patient with a celiacomesenteric trunk. A standard renegade micro catheter as well as a soft synchro an a standard .016 synchro wire were used to select branches of the superior mesenteric artery. Sub selection included:  Replaced common hepatic artery.  Right hepatic artery.  New middle colicky artery.  Proximal jejunal branches.  Two base catheter exchanges were made on the Bentson wire including exchange for Mickelson catheter and exchanged for a C2 Cobra catheter for formation of a Waltman loop.  After dedicated angiogram of each of the sub selected arteries, all catheters and wires were removed.  Angiogram through the sheath was performed with the common femoral artery.  Deployment of an Exoseal device for hemostasis  failed without deployment. Manual pressure was used for hemostasis of the right common femoral artery.  Patient tolerated procedure well and remained hemodynamically stable throughout. At the end of the procedure, the patient's vital signs were within normal range. Systolic brought pressure 110. Heart rate of 80-90. No syncopal events, no altered mental status, no hematemesis, and no hematochezia/melena.  Marland Kitchen  FINDINGS: Aortic angiogram:  Normal course caliber and contour of the abdominal aorta. Multiple segmental vessels fill within the lower thoracic spine and lumbar spine. No aneurysm dissection flap identified. No significant atherosclerotic changes.  Steep angled take-off of the bilateral common iliac arteries.  Single bilateral renal arteries.  No celiac artery origin.  Superior mesenteric artery patent, with configuration of a celiacomesenteric trunk.  Late phase of the aortic rind demonstrates no evidence of bleeding.  Celiacomesenteric angiogram:  Normal caliber and contour of the origin of the trunk. Multiple jejunal branches present. Normal caliber and contour of the ileocolic artery. Multiple tortuous vessels overlie the proximal 3rd  of the trunk, with evidence of a replaced common hepatic artery contributing to both right and left hepatic arteries. There is a replaced vessel representing a celiac vessel, with cross-filling at a bifurcation to the splenic artery and the hepatic arteries. Patent left gastric artery. New  Late phase of the trunk injection demonstrates no evidence of bleeding. No tumor blush or pooling of contrast. Patent portal venous system identified.  Common hepatic artery injection:  Replaced common hepatic artery with right hepatic arteries and left hepatic arteries from the replaced trunk. No evidence of tumor blush, aneurysm, dissection flap, pulling of contrast. No abnormal enhancement identified. No pseudoaneurysm identified.  No call it artery injection:  Patent middle call it  artery of normal caliber. Significant cross flow from collateral vessels with no pooling of contrast, pseudoaneurysm, or tumor blush.  Proximal jejunal arcade:  Normal course caliber and contour. Normal blush of the proximal jejunal mucosa. No filling defects, or pseudoaneurysm.  No embolization was performed.  IMPRESSION: Status post diagnostic mesenteric angiogram.  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  No bleeding was identified from the replaced common hepatic artery, which would be the best candidate for supplying the pancreaticoduodenal arteries near the known proximal duodenum ulcer. Because no bleeding was identified of this variant anatomy, no empiric embolization was performed.  Signed,  Dulcy Fanny. Earleen Newport, DO  [facility]  [facility]  PLAN: The patient will require 6 hours flattened bed with log roll only for hemostasis of the right common femoral artery after manual compression.  Agree with ICU management, including serial hemoglobin and hematocrit checks.   Electronically Signed   By: Corrie Mckusick D.O.   On: 05/05/2014 16:37   Ir Angiogram Selective Each Additional Vessel  05/05/2014   INDICATION: 34 year old female admitted to the hospital with upper GI bleeding and hematemesis.  She was admitted April 26, 2013. Upper endoscopy demonstrates duodenum ulcer.  She has been stable over 1 week, though developed syncopal episode and acute anemia with a hemoglobin of 5.7 on today's date. She has been referred for evaluation and treatment of the duodenum ulcer hemorrhage.  EXAM: 1. AORTIC ANGIOGRAM 2. SUPERIOR MESENTERIC ARTERIOGRAM 3. SUB SELECTION OF FOUR TERTIARY BRANCHES FROM THE SUPERIOR MESENTERIC ARTERY 4. ULTRASOUND GUIDED VASCULAR ACCESS OF THE RIGHT COMMON FEMORAL ARTERY 5. MANUAL PRESSURE FOR HEMOSTASIS.  COMPARISON:  CT abdomen pelvis, 04/27/2013, 09/22/2001  MEDICATIONS:  Versed 2.0 mg IV; Fentanyl 100 mcg IV  CONTRAST:  183m OMNIPAQUE IOHEXOL 300 MG/ML  SOLN  ANESTHESIA/SEDATION: One hundred fifty minutes  FLUOROSCOPY TIME:  Forty-one minutes.  Fifty-four seconds.  2941 mGy  ACCESS: Right common femoral artery; hemostasis achieved with manual compression.  COMPLICATIONS: None immediate  TECHNIQUE: Informed written consent was obtained from the patient after a discussion of the risks, benefits and alternatives to treatment. Questions regarding the procedure were encouraged and answered. A timeout was performed prior to the initiation of the procedure.  The right groin was prepped and draped in the usual sterile fashion, and a sterile drape was applied covering the operative field. Maximum barrier sterile technique with sterile gowns and gloves were used for the procedure. A timeout was performed prior to the initiation of the procedure. Local anesthesia was provided with 1% lidocaine.  Physical exam was performed to identify the location of the right common femoral artery. Ultrasound survey was performed with images stored sent to PACs.  Once the common femoral artery was identified  under ultrasound, ultrasound guidance was used to generously infiltrate the skin and subcutaneous tissues with 1% lidocaine without epinephrine. A micropuncture access was then used to puncture the right common femoral artery. With excellent blood flow returned, and micro wire was advanced through the needle observed under fluoroscopy to enter the iliac system. The needle was removed from wire a micropuncture kit was advanced over the wire. The inner dilator and cannula were removed with a wire, and an 035 Bentson wire was advanced into the abdominal aorta under fluoroscopy the micro puncture was removed, and a 5 French sheath was advanced over the Bentson wire into the common femoral artery. The dilator was removed and the sheath was flushed.  A pigtail Flush catheter was advanced over the Bentson wire  into the lower thoracic aorta. Aortic flush angiogram performed.  Pigtail catheter was then exchanged over wire for C2 Cobra catheter which was used to select the superior mesenteric artery. Superior mesenteric artery angiogram was performed.  Subsequently, a combination of the micro catheter a micro wire were used to select various branches of superior mesenteric artery in this patient with a celiacomesenteric trunk. A standard renegade micro catheter as well as a soft synchro an a standard .016 synchro wire were used to select branches of the superior mesenteric artery. Sub selection included:  Replaced common hepatic artery.  Right hepatic artery.  New middle colicky artery.  Proximal jejunal branches.  Two base catheter exchanges were made on the Bentson wire including exchange for Mickelson catheter and exchanged for a C2 Cobra catheter for formation of a Waltman loop.  After dedicated angiogram of each of the sub selected arteries, all catheters and wires were removed.  Angiogram through the sheath was performed with the common femoral artery.  Deployment of an Exoseal device for hemostasis failed without deployment. Manual pressure was used for hemostasis of the right common femoral artery.  Patient tolerated procedure well and remained hemodynamically stable throughout. At the end of the procedure, the patient's vital signs were within normal range. Systolic brought pressure 110. Heart rate of 80-90. No syncopal events, no altered mental status, no hematemesis, and no hematochezia/melena.  Marland Kitchen  FINDINGS: Aortic angiogram:  Normal course caliber and contour of the abdominal aorta. Multiple segmental vessels fill within the lower thoracic spine and lumbar spine. No aneurysm dissection flap identified. No significant atherosclerotic changes.  Steep angled take-off of the bilateral common iliac arteries.  Single bilateral renal arteries.  No celiac artery origin.  Superior mesenteric artery patent, with  configuration of a celiacomesenteric trunk.  Late phase of the aortic rind demonstrates no evidence of bleeding.  Celiacomesenteric angiogram:  Normal caliber and contour of the origin of the trunk. Multiple jejunal branches present. Normal caliber and contour of the ileocolic artery. Multiple tortuous vessels overlie the proximal 3rd of the trunk, with evidence of a replaced common hepatic artery contributing to both right and left hepatic arteries. There is a replaced vessel representing a celiac vessel, with cross-filling at a bifurcation to the splenic artery and the hepatic arteries. Patent left gastric artery. New  Late phase of the trunk injection demonstrates no evidence of bleeding. No tumor blush or pooling of contrast. Patent portal venous system identified.  Common hepatic artery injection:  Replaced common hepatic artery with right hepatic arteries and left hepatic arteries from the replaced trunk. No evidence of tumor blush, aneurysm, dissection flap, pulling of contrast. No abnormal enhancement identified. No pseudoaneurysm identified.  No call it artery injection:  Patent middle call it artery of normal caliber. Significant cross flow from collateral vessels with no pooling of contrast, pseudoaneurysm, or tumor blush.  Proximal jejunal arcade:  Normal course caliber and contour. Normal blush of the proximal jejunal mucosa. No filling defects, or pseudoaneurysm.  No embolization was performed.  IMPRESSION: Status post diagnostic mesenteric angiogram.  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  No bleeding was identified from the replaced common hepatic artery, which would be the best candidate for supplying the pancreaticoduodenal arteries near the known proximal duodenum ulcer. Because no bleeding was identified of this variant anatomy, no empiric embolization was performed.  Signed,  Dulcy Fanny. Earleen Newport, DO  [facility]  [facility]  PLAN: The patient will require 6 hours flattened bed with log roll only for hemostasis of the right common femoral artery after manual compression.  Agree with ICU management, including serial hemoglobin and hematocrit checks.   Electronically Signed   By: Corrie Mckusick D.O.   On: 05/05/2014 16:37   Ir Angiogram Selective Each Additional Vessel  05/05/2014   INDICATION: 34 year old female admitted to the hospital with upper GI bleeding and hematemesis.  She was admitted April 26, 2013. Upper endoscopy demonstrates duodenum ulcer.  She has been stable over 1 week, though developed syncopal episode and acute anemia with a hemoglobin of 5.7 on today's date. She has been referred for evaluation and treatment of the duodenum ulcer hemorrhage.  EXAM: 1. AORTIC ANGIOGRAM 2. SUPERIOR MESENTERIC ARTERIOGRAM 3. SUB SELECTION OF FOUR TERTIARY BRANCHES FROM THE SUPERIOR MESENTERIC ARTERY 4. ULTRASOUND GUIDED VASCULAR ACCESS OF THE RIGHT COMMON FEMORAL ARTERY 5. MANUAL PRESSURE FOR HEMOSTASIS.  COMPARISON:  CT abdomen pelvis, 04/27/2013, 09/22/2001  MEDICATIONS: Versed 2.0 mg IV; Fentanyl 100 mcg IV  CONTRAST:  157m OMNIPAQUE IOHEXOL 300 MG/ML  SOLN  ANESTHESIA/SEDATION: One hundred fifty minutes  FLUOROSCOPY TIME:  Forty-one minutes.  Fifty-four seconds.  2941 mGy  ACCESS: Right common femoral artery; hemostasis achieved with manual compression.  COMPLICATIONS: None immediate  TECHNIQUE: Informed written consent was obtained from the patient after a discussion of the risks, benefits and alternatives to treatment. Questions regarding the procedure were encouraged and answered. A timeout was performed prior to the initiation of the procedure.  The right groin was prepped and draped in the usual sterile fashion, and a sterile drape was applied covering the operative field. Maximum barrier sterile technique with sterile gowns and gloves were used for the procedure. A  timeout was performed prior to the initiation of the procedure. Local anesthesia was provided with 1% lidocaine.  Physical exam was performed to identify the location of the right common femoral artery. Ultrasound survey was performed with images stored sent to PACs.  Once the common femoral artery was identified under ultrasound, ultrasound guidance was used to generously infiltrate the skin and subcutaneous tissues with 1% lidocaine without epinephrine. A micropuncture access was then used to puncture the right common femoral artery. With excellent blood flow returned, and micro wire was advanced through the needle observed under fluoroscopy to enter the iliac system. The needle was removed from wire a micropuncture kit was advanced over the wire. The inner dilator and cannula were removed with a wire, and an 035 Bentson wire was advanced into the abdominal aorta under fluoroscopy the micro puncture was removed, and a 5 French sheath was advanced over the Bentson wire into the common femoral artery. The dilator was removed and the  sheath was flushed.  A pigtail Flush catheter was advanced over the Bentson wire into the lower thoracic aorta. Aortic flush angiogram performed.  Pigtail catheter was then exchanged over wire for C2 Cobra catheter which was used to select the superior mesenteric artery. Superior mesenteric artery angiogram was performed.  Subsequently, a combination of the micro catheter a micro wire were used to select various branches of superior mesenteric artery in this patient with a celiacomesenteric trunk. A standard renegade micro catheter as well as a soft synchro an a standard .016 synchro wire were used to select branches of the superior mesenteric artery. Sub selection included:  Replaced common hepatic artery.  Right hepatic artery.  New middle colicky artery.  Proximal jejunal branches.  Two base catheter exchanges were made on the Bentson wire including exchange for Mickelson catheter and  exchanged for a C2 Cobra catheter for formation of a Waltman loop.  After dedicated angiogram of each of the sub selected arteries, all catheters and wires were removed.  Angiogram through the sheath was performed with the common femoral artery.  Deployment of an Exoseal device for hemostasis failed without deployment. Manual pressure was used for hemostasis of the right common femoral artery.  Patient tolerated procedure well and remained hemodynamically stable throughout. At the end of the procedure, the patient's vital signs were within normal range. Systolic brought pressure 110. Heart rate of 80-90. No syncopal events, no altered mental status, no hematemesis, and no hematochezia/melena.  Marland Kitchen  FINDINGS: Aortic angiogram:  Normal course caliber and contour of the abdominal aorta. Multiple segmental vessels fill within the lower thoracic spine and lumbar spine. No aneurysm dissection flap identified. No significant atherosclerotic changes.  Steep angled take-off of the bilateral common iliac arteries.  Single bilateral renal arteries.  No celiac artery origin.  Superior mesenteric artery patent, with configuration of a celiacomesenteric trunk.  Late phase of the aortic rind demonstrates no evidence of bleeding.  Celiacomesenteric angiogram:  Normal caliber and contour of the origin of the trunk. Multiple jejunal branches present. Normal caliber and contour of the ileocolic artery. Multiple tortuous vessels overlie the proximal 3rd of the trunk, with evidence of a replaced common hepatic artery contributing to both right and left hepatic arteries. There is a replaced vessel representing a celiac vessel, with cross-filling at a bifurcation to the splenic artery and the hepatic arteries. Patent left gastric artery. New  Late phase of the trunk injection demonstrates no evidence of bleeding. No tumor blush or pooling of contrast. Patent portal venous system identified.  Common hepatic artery injection:  Replaced common  hepatic artery with right hepatic arteries and left hepatic arteries from the replaced trunk. No evidence of tumor blush, aneurysm, dissection flap, pulling of contrast. No abnormal enhancement identified. No pseudoaneurysm identified.  No call it artery injection:  Patent middle call it artery of normal caliber. Significant cross flow from collateral vessels with no pooling of contrast, pseudoaneurysm, or tumor blush.  Proximal jejunal arcade:  Normal course caliber and contour. Normal blush of the proximal jejunal mucosa. No filling defects, or pseudoaneurysm.  No embolization was performed.  IMPRESSION: Status post diagnostic mesenteric angiogram.  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  No bleeding was identified from the replaced common hepatic artery, which would be the best candidate for supplying the pancreaticoduodenal arteries near the known proximal duodenum ulcer. Because no bleeding was identified of this variant anatomy, no empiric embolization was performed.  Signed,  Dulcy Fanny. Earleen Newport, DO  [facility]  [facility]  PLAN: The patient will require 6 hours flattened bed with log roll only for hemostasis of the right common femoral artery after manual compression.  Agree with ICU management, including serial hemoglobin and hematocrit checks.   Electronically Signed   By: Corrie Mckusick D.O.   On: 05/05/2014 16:37   Ir Angiogram Selective Each Additional Vessel  05/05/2014   INDICATION: 34 year old female admitted to the hospital with upper GI bleeding and hematemesis.  She was admitted April 26, 2013. Upper endoscopy demonstrates duodenum ulcer.  She has been stable over 1 week, though developed syncopal episode and acute anemia with a hemoglobin of 5.7 on today's date. She has been referred for evaluation and treatment of the duodenum ulcer hemorrhage.  EXAM: 1. AORTIC ANGIOGRAM 2.  SUPERIOR MESENTERIC ARTERIOGRAM 3. SUB SELECTION OF FOUR TERTIARY BRANCHES FROM THE SUPERIOR MESENTERIC ARTERY 4. ULTRASOUND GUIDED VASCULAR ACCESS OF THE RIGHT COMMON FEMORAL ARTERY 5. MANUAL PRESSURE FOR HEMOSTASIS.  COMPARISON:  CT abdomen pelvis, 04/27/2013, 09/22/2001  MEDICATIONS: Versed 2.0 mg IV; Fentanyl 100 mcg IV  CONTRAST:  19m OMNIPAQUE IOHEXOL 300 MG/ML  SOLN  ANESTHESIA/SEDATION: One hundred fifty minutes  FLUOROSCOPY TIME:  Forty-one minutes.  Fifty-four seconds.  2941 mGy  ACCESS: Right common femoral artery; hemostasis achieved with manual compression.  COMPLICATIONS: None immediate  TECHNIQUE: Informed written consent was obtained from the patient after a discussion of the risks, benefits and alternatives to treatment. Questions regarding the procedure were encouraged and answered. A timeout was performed prior to the initiation of the procedure.  The right groin was prepped and draped in the usual sterile fashion, and a sterile drape was applied covering the operative field. Maximum barrier sterile technique with sterile gowns and gloves were used for the procedure. A timeout was performed prior to the initiation of the procedure. Local anesthesia was provided with 1% lidocaine.  Physical exam was performed to identify the location of the right common femoral artery. Ultrasound survey was performed with images stored sent to PACs.  Once the common femoral artery was identified under ultrasound, ultrasound guidance was used to generously infiltrate the skin and subcutaneous tissues with 1% lidocaine without epinephrine. A micropuncture access was then used to puncture the right common femoral artery. With excellent blood flow returned, and micro wire was advanced through the needle observed under fluoroscopy to enter the iliac system. The needle was removed from wire a micropuncture kit was advanced over the wire. The inner dilator and cannula were removed with a wire, and an 035 Bentson wire  was advanced into the abdominal aorta under fluoroscopy the micro puncture was removed, and a 5 French sheath was advanced over the Bentson wire into the common femoral artery. The dilator was removed and the sheath was flushed.  A pigtail Flush catheter was advanced over the Bentson wire into the lower thoracic aorta. Aortic flush angiogram performed.  Pigtail catheter was then exchanged over wire for C2 Cobra catheter which was used to select the superior mesenteric artery. Superior mesenteric artery angiogram was performed.  Subsequently, a combination of the micro catheter a micro wire were used to select various branches of superior mesenteric artery in this patient with a celiacomesenteric trunk. A standard renegade micro catheter as well as a soft synchro an a standard .016 synchro wire were used to select branches of the superior mesenteric artery. Sub selection included:  Replaced common hepatic artery.  Right hepatic artery.  New middle colicky artery.  Proximal jejunal branches.  Two base catheter exchanges were made on the Bentson wire including exchange for Mickelson catheter and exchanged for a C2 Cobra catheter for formation of a Waltman loop.  After dedicated angiogram of each of the sub selected arteries, all catheters and wires were removed.  Angiogram through the sheath was performed with the common femoral artery.  Deployment of an Exoseal device for hemostasis failed without deployment. Manual pressure was used for hemostasis of the right common femoral artery.  Patient tolerated procedure well and remained hemodynamically stable throughout. At the end of the procedure, the patient's vital signs were within normal range. Systolic brought pressure 110. Heart rate of 80-90. No syncopal events, no altered mental status, no hematemesis, and no hematochezia/melena.  Marland Kitchen  FINDINGS: Aortic angiogram:  Normal course caliber and contour of the abdominal aorta. Multiple segmental vessels fill within the  lower thoracic spine and lumbar spine. No aneurysm dissection flap identified. No significant atherosclerotic changes.  Steep angled take-off of the bilateral common iliac arteries.  Single bilateral renal arteries.  No celiac artery origin.  Superior mesenteric artery patent, with configuration of a celiacomesenteric trunk.  Late phase of the aortic rind demonstrates no evidence of bleeding.  Celiacomesenteric angiogram:  Normal caliber and contour of the origin of the trunk. Multiple jejunal branches present. Normal caliber and contour of the ileocolic artery. Multiple tortuous vessels overlie the proximal 3rd of the trunk, with evidence of a replaced common hepatic artery contributing to both right and left hepatic arteries. There is a replaced vessel representing a celiac vessel, with cross-filling at a bifurcation to the splenic artery and the hepatic arteries. Patent left gastric artery. New  Late phase of the trunk injection demonstrates no evidence of bleeding. No tumor blush or pooling of contrast. Patent portal venous system identified.  Common hepatic artery injection:  Replaced common hepatic artery with right hepatic arteries and left hepatic arteries from the replaced trunk. No evidence of tumor blush, aneurysm, dissection flap, pulling of contrast. No abnormal enhancement identified. No pseudoaneurysm identified.  No call it artery injection:  Patent middle call it artery of normal caliber. Significant cross flow from collateral vessels with no pooling of contrast, pseudoaneurysm, or tumor blush.  Proximal jejunal arcade:  Normal course caliber and contour. Normal blush of the proximal jejunal mucosa. No filling defects, or pseudoaneurysm.  No embolization was performed.  IMPRESSION: Status post diagnostic mesenteric angiogram.  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  No bleeding was identified from the replaced common hepatic  artery, which would be the best candidate for supplying the pancreaticoduodenal arteries near the known proximal duodenum ulcer. Because no bleeding was identified of this variant anatomy, no empiric embolization was performed.  Signed,  Dulcy Fanny. Earleen Newport, DO  [facility]  [facility]  PLAN: The patient will require 6 hours flattened bed with log roll only for hemostasis of the right common femoral artery after manual compression.  Agree with ICU management, including serial hemoglobin and hematocrit checks.   Electronically Signed   By: Corrie Mckusick D.O.   On: 05/05/2014 16:37   Ir Angiogram Selective Each Additional Vessel  05/05/2014   INDICATION: 34 year old female admitted to the hospital with upper GI bleeding and hematemesis.  She was admitted April 26, 2013. Upper endoscopy demonstrates duodenum ulcer.  She has been stable over 1 week, though developed syncopal episode and acute anemia with  a hemoglobin of 5.7 on today's date. She has been referred for evaluation and treatment of the duodenum ulcer hemorrhage.  EXAM: 1. AORTIC ANGIOGRAM 2. SUPERIOR MESENTERIC ARTERIOGRAM 3. SUB SELECTION OF FOUR TERTIARY BRANCHES FROM THE SUPERIOR MESENTERIC ARTERY 4. ULTRASOUND GUIDED VASCULAR ACCESS OF THE RIGHT COMMON FEMORAL ARTERY 5. MANUAL PRESSURE FOR HEMOSTASIS.  COMPARISON:  CT abdomen pelvis, 04/27/2013, 09/22/2001  MEDICATIONS: Versed 2.0 mg IV; Fentanyl 100 mcg IV  CONTRAST:  131m OMNIPAQUE IOHEXOL 300 MG/ML  SOLN  ANESTHESIA/SEDATION: One hundred fifty minutes  FLUOROSCOPY TIME:  Forty-one minutes.  Fifty-four seconds.  2941 mGy  ACCESS: Right common femoral artery; hemostasis achieved with manual compression.  COMPLICATIONS: None immediate  TECHNIQUE: Informed written consent was obtained from the patient after a discussion of the risks, benefits and alternatives to treatment. Questions regarding the procedure were encouraged and answered. A timeout was  performed prior to the initiation of the procedure.  The right groin was prepped and draped in the usual sterile fashion, and a sterile drape was applied covering the operative field. Maximum barrier sterile technique with sterile gowns and gloves were used for the procedure. A timeout was performed prior to the initiation of the procedure. Local anesthesia was provided with 1% lidocaine.  Physical exam was performed to identify the location of the right common femoral artery. Ultrasound survey was performed with images stored sent to PACs.  Once the common femoral artery was identified under ultrasound, ultrasound guidance was used to generously infiltrate the skin and subcutaneous tissues with 1% lidocaine without epinephrine. A micropuncture access was then used to puncture the right common femoral artery. With excellent blood flow returned, and micro wire was advanced through the needle observed under fluoroscopy to enter the iliac system. The needle was removed from wire a micropuncture kit was advanced over the wire. The inner dilator and cannula were removed with a wire, and an 035 Bentson wire was advanced into the abdominal aorta under fluoroscopy the micro puncture was removed, and a 5 French sheath was advanced over the Bentson wire into the common femoral artery. The dilator was removed and the sheath was flushed.  A pigtail Flush catheter was advanced over the Bentson wire into the lower thoracic aorta. Aortic flush angiogram performed.  Pigtail catheter was then exchanged over wire for C2 Cobra catheter which was used to select the superior mesenteric artery. Superior mesenteric artery angiogram was performed.  Subsequently, a combination of the micro catheter a micro wire were used to select various branches of superior mesenteric artery in this patient with a celiacomesenteric trunk. A standard renegade micro catheter as well as a soft synchro an a standard .016 synchro wire were used to select  branches of the superior mesenteric artery. Sub selection included:  Replaced common hepatic artery.  Right hepatic artery.  New middle colicky artery.  Proximal jejunal branches.  Two base catheter exchanges were made on the Bentson wire including exchange for Mickelson catheter and exchanged for a C2 Cobra catheter for formation of a Waltman loop.  After dedicated angiogram of each of the sub selected arteries, all catheters and wires were removed.  Angiogram through the sheath was performed with the common femoral artery.  Deployment of an Exoseal device for hemostasis failed without deployment. Manual pressure was used for hemostasis of the right common femoral artery.  Patient tolerated procedure well and remained hemodynamically stable throughout. At the end of the procedure, the patient's vital signs were within normal range. Systolic brought pressure 110.  Heart rate of 80-90. No syncopal events, no altered mental status, no hematemesis, and no hematochezia/melena.  Marland Kitchen  FINDINGS: Aortic angiogram:  Normal course caliber and contour of the abdominal aorta. Multiple segmental vessels fill within the lower thoracic spine and lumbar spine. No aneurysm dissection flap identified. No significant atherosclerotic changes.  Steep angled take-off of the bilateral common iliac arteries.  Single bilateral renal arteries.  No celiac artery origin.  Superior mesenteric artery patent, with configuration of a celiacomesenteric trunk.  Late phase of the aortic rind demonstrates no evidence of bleeding.  Celiacomesenteric angiogram:  Normal caliber and contour of the origin of the trunk. Multiple jejunal branches present. Normal caliber and contour of the ileocolic artery. Multiple tortuous vessels overlie the proximal 3rd of the trunk, with evidence of a replaced common hepatic artery contributing to both right and left hepatic arteries. There is a replaced vessel representing a celiac vessel, with cross-filling at a  bifurcation to the splenic artery and the hepatic arteries. Patent left gastric artery. New  Late phase of the trunk injection demonstrates no evidence of bleeding. No tumor blush or pooling of contrast. Patent portal venous system identified.  Common hepatic artery injection:  Replaced common hepatic artery with right hepatic arteries and left hepatic arteries from the replaced trunk. No evidence of tumor blush, aneurysm, dissection flap, pulling of contrast. No abnormal enhancement identified. No pseudoaneurysm identified.  No call it artery injection:  Patent middle call it artery of normal caliber. Significant cross flow from collateral vessels with no pooling of contrast, pseudoaneurysm, or tumor blush.  Proximal jejunal arcade:  Normal course caliber and contour. Normal blush of the proximal jejunal mucosa. No filling defects, or pseudoaneurysm.  No embolization was performed.  IMPRESSION: Status post diagnostic mesenteric angiogram.  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  No bleeding was identified from the replaced common hepatic artery, which would be the best candidate for supplying the pancreaticoduodenal arteries near the known proximal duodenum ulcer. Because no bleeding was identified of this variant anatomy, no empiric embolization was performed.  Signed,  Dulcy Fanny. Earleen Newport, DO  [facility]  [facility]  PLAN: The patient will require 6 hours flattened bed with log roll only for hemostasis of the right common femoral artery after manual compression.  Agree with ICU management, including serial hemoglobin and hematocrit checks.   Electronically Signed   By: Corrie Mckusick D.O.   On: 05/05/2014 16:37   Ir US Guide Vasc Access Right  05/05/2014   INDICATION: 34 year old female admitted to the hospital with upper GI bleeding and hematemesis.  She was admitted April 26, 2013. Upper  endoscopy demonstrates duodenum ulcer.  She has been stable over 1 week, though developed syncopal episode and acute anemia with a hemoglobin of 5.7 on today's date. She has been referred for evaluation and treatment of the duodenum ulcer hemorrhage.  EXAM: 1. AORTIC ANGIOGRAM 2. SUPERIOR MESENTERIC ARTERIOGRAM 3. SUB SELECTION OF FOUR TERTIARY BRANCHES FROM THE SUPERIOR MESENTERIC ARTERY 4. ULTRASOUND GUIDED VASCULAR ACCESS OF THE RIGHT COMMON FEMORAL ARTERY 5. MANUAL PRESSURE FOR HEMOSTASIS.  COMPARISON:  CT abdomen pelvis, 04/27/2013, 09/22/2001  MEDICATIONS: Versed 2.0 mg IV; Fentanyl 100 mcg IV  CONTRAST:  164m OMNIPAQUE IOHEXOL 300 MG/ML  SOLN  ANESTHESIA/SEDATION: One hundred fifty minutes  FLUOROSCOPY TIME:  Forty-one minutes.  Fifty-four seconds.  2941 mGy  ACCESS: Right common femoral artery; hemostasis achieved with manual compression.  COMPLICATIONS: None  immediate  TECHNIQUE: Informed written consent was obtained from the patient after a discussion of the risks, benefits and alternatives to treatment. Questions regarding the procedure were encouraged and answered. A timeout was performed prior to the initiation of the procedure.  The right groin was prepped and draped in the usual sterile fashion, and a sterile drape was applied covering the operative field. Maximum barrier sterile technique with sterile gowns and gloves were used for the procedure. A timeout was performed prior to the initiation of the procedure. Local anesthesia was provided with 1% lidocaine.  Physical exam was performed to identify the location of the right common femoral artery. Ultrasound survey was performed with images stored sent to PACs.  Once the common femoral artery was identified under ultrasound, ultrasound guidance was used to generously infiltrate the skin and subcutaneous tissues with 1% lidocaine without epinephrine. A micropuncture access was then used to puncture the right common femoral artery. With excellent  blood flow returned, and micro wire was advanced through the needle observed under fluoroscopy to enter the iliac system. The needle was removed from wire a micropuncture kit was advanced over the wire. The inner dilator and cannula were removed with a wire, and an 035 Bentson wire was advanced into the abdominal aorta under fluoroscopy the micro puncture was removed, and a 5 French sheath was advanced over the Bentson wire into the common femoral artery. The dilator was removed and the sheath was flushed.  A pigtail Flush catheter was advanced over the Bentson wire into the lower thoracic aorta. Aortic flush angiogram performed.  Pigtail catheter was then exchanged over wire for C2 Cobra catheter which was used to select the superior mesenteric artery. Superior mesenteric artery angiogram was performed.  Subsequently, a combination of the micro catheter a micro wire were used to select various branches of superior mesenteric artery in this patient with a celiacomesenteric trunk. A standard renegade micro catheter as well as a soft synchro an a standard .016 synchro wire were used to select branches of the superior mesenteric artery. Sub selection included:  Replaced common hepatic artery.  Right hepatic artery.  New middle colicky artery.  Proximal jejunal branches.  Two base catheter exchanges were made on the Bentson wire including exchange for Mickelson catheter and exchanged for a C2 Cobra catheter for formation of a Waltman loop.  After dedicated angiogram of each of the sub selected arteries, all catheters and wires were removed.  Angiogram through the sheath was performed with the common femoral artery.  Deployment of an Exoseal device for hemostasis failed without deployment. Manual pressure was used for hemostasis of the right common femoral artery.  Patient tolerated procedure well and remained hemodynamically stable throughout. At the end of the procedure, the patient's vital signs were within normal  range. Systolic brought pressure 110. Heart rate of 80-90. No syncopal events, no altered mental status, no hematemesis, and no hematochezia/melena.  Marland Kitchen  FINDINGS: Aortic angiogram:  Normal course caliber and contour of the abdominal aorta. Multiple segmental vessels fill within the lower thoracic spine and lumbar spine. No aneurysm dissection flap identified. No significant atherosclerotic changes.  Steep angled take-off of the bilateral common iliac arteries.  Single bilateral renal arteries.  No celiac artery origin.  Superior mesenteric artery patent, with configuration of a celiacomesenteric trunk.  Late phase of the aortic rind demonstrates no evidence of bleeding.  Celiacomesenteric angiogram:  Normal caliber and contour of the origin of the trunk. Multiple jejunal branches present. Normal caliber and  contour of the ileocolic artery. Multiple tortuous vessels overlie the proximal 3rd of the trunk, with evidence of a replaced common hepatic artery contributing to both right and left hepatic arteries. There is a replaced vessel representing a celiac vessel, with cross-filling at a bifurcation to the splenic artery and the hepatic arteries. Patent left gastric artery. New  Late phase of the trunk injection demonstrates no evidence of bleeding. No tumor blush or pooling of contrast. Patent portal venous system identified.  Common hepatic artery injection:  Replaced common hepatic artery with right hepatic arteries and left hepatic arteries from the replaced trunk. No evidence of tumor blush, aneurysm, dissection flap, pulling of contrast. No abnormal enhancement identified. No pseudoaneurysm identified.  No call it artery injection:  Patent middle call it artery of normal caliber. Significant cross flow from collateral vessels with no pooling of contrast, pseudoaneurysm, or tumor blush.  Proximal jejunal arcade:  Normal course caliber and contour. Normal blush of the proximal jejunal mucosa. No filling defects, or  pseudoaneurysm.  No embolization was performed.  IMPRESSION: Status post diagnostic mesenteric angiogram.  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  No bleeding was identified from the replaced common hepatic artery, which would be the best candidate for supplying the pancreaticoduodenal arteries near the known proximal duodenum ulcer. Because no bleeding was identified of this variant anatomy, no empiric embolization was performed.  Signed,  Dulcy Fanny. Earleen Newport, DO  [facility]  [facility]  PLAN: The patient will require 6 hours flattened bed with log roll only for hemostasis of the right common femoral artery after manual compression.  Agree with ICU management, including serial hemoglobin and hematocrit checks.   Electronically Signed   By: Corrie Mckusick D.O.   On: 05/05/2014 16:37    Labs:  CBC:  Recent Labs  05/05/14 0948 05/05/14 1600 05/06/14 0400 05/07/14 0500 05/07/14 1255  WBC 6.2 7.8 9.9 9.9  --   HGB 5.7* 9.9* 9.0* 7.8* 6.8*  HCT 17.2* 29.5* 26.0* 23.1* 20.2*  PLT 187 190 181 217  --     COAGS:  Recent Labs  04/26/14 2314 05/04/14 0518  INR 1.16 1.04  APTT  --  30    BMP:  Recent Labs  05/04/14 0518 05/05/14 1600 05/05/14 1800 05/06/14 1010 05/07/14 0500  NA 138 138  --  135 137  K 3.7 6.6* 3.6 3.6 3.7  CL 107 108  --  108 109  CO2 27 24  --  23 24  GLUCOSE 93 136*  --  85 102*  BUN <5* 12  --  14 14  CALCIUM 8.1* 6.9*  --  7.5* 7.7*  CREATININE 0.58 0.58  --  0.48* 0.53  GFRNONAA >90 >90  --  >90 >90  GFRAA >90 >90  --  >90 >90    LIVER FUNCTION TESTS:  Recent Labs  04/26/14 2314 04/28/14 0321 04/30/14 0828 05/04/14 0518  BILITOT 0.1* <0.1* 0.4 0.5  AST _0 ALT _1 ALKPHOS 66 52 58 59  PROT 4.5* 4.1* 5.0* 4.6*  ALBUMIN 2.4* 2.1* 2.5* 2.3*    TUMOR MARKERS: No results for input(s): AFPTM, CEA, CA199,  CHROMGRNA in the last 8760 hours.  Assessment and Plan:  History and angiography reviewed.  Vascular anatomy is very unfavorable for embolization and repeat angiography would likely be of no yield.  Even with visualization of extravasation from a duodenal  ulcer, embolization may not be possible without potential compromise of hepatic arterial supply.  Recommend checking with GI to see if repeat endoscopy feasible.  Recommend General Surgery consultation, as surgical intervention may be necessary if the ulcer continues to bleed.  Discussed case with Dr. Wyline Copas by phone.  SignedAletta Edouard T 05/07/2014, 4:18 PM

## 2014-05-07 NOTE — H&P (Signed)
Pt Father notified of patient transfer to [facility]87M

## 2014-05-07 NOTE — Significant Event (Signed)
Rapid Response Event Note  Overview: Time Called: 1812 Arrival Time: 1813 Event Type: Hypotension, GI bleed Initial Focused Assessment:  Called to see pt who began vomiting BRB, (admited for same with multiple transfusions).  On arrival, pt tachy to 170, BP 80/40, resp 22, 100% on RA, restless and anxious.  Pt sitting up, unable to tolerate trendelenburg, no resp distress, anxious but alert and oriented, protecting airway well.       Event Summary:  Pt receiving blood on arrival, rate increased to 999.  NS started at 999.  Protonix running.  Pt given 12.5 of Phenergran.  Dr Dorna Bloomhew, multiple 5W RNS and AC to bedside.  Pt transferred to 31M.  2 mg Ativan given on arrival to 31M.  Dr Janee Mornhompson consulted.  2 additional unites of BRBCs ordered.  Bedside handoff given to receiving 31M RN.  Pt's vitals improved on arrival to 31M, HR 130, BP 103/70, resps 20, 100% on [place]. Name of Physician Notified: Dr Dorna Bloomhew at [phone]  Name of Consulting Physician Notified: Dr Janee Mornhompson at 1845  Outcome: Transferred (Comment) (to 31M)  Event End Time: [id number]  Haskel KhanMOORE, Alicianna Litchford Thomas

## 2014-05-07 NOTE — Op Note (Signed)
04/26/2014 - 05/07/2014  9:22 PM  PATIENT:  Kimberly Weiss  34 y.o. female  PRE-OPERATIVE DIAGNOSIS:  bleeding ulcer  POST-OPERATIVE DIAGNOSIS:  bleeding ulcer  PROCEDURE:  Procedure(s): EXPLORATORY LAPAROTOMY OVERSOWE DUODENAL ULCER  SURGEON:  Violeta GelinasBurke Eymi Lipuma, M.D.  ASSISTANTS: Claud KelpHaywood Ingram, M.D.   ANESTHESIA:   general  EBL:  Total I/O In: 670 [Blood:670] Out: 575 [Urine:225; Other:250; Blood:100]  BLOOD ADMINISTERED:2 units CC PRBC  DRAINS: (1) Jackson-Pratt drain(s) with closed bulb suction in the Epigastrium   SPECIMEN:  No Specimen  DISPOSITION OF SPECIMEN:  N/A  COUNTS:  YES  DICTATION: .Dragon Dictation Natalia LeatherwoodKatherine has been hospitalized with an intermittently bleeding large duodenal ulcer. She has received 10 units of blood transfusion over the past several days. She has undergone upper endoscopy 2 and angiogram during this admission. Her hemoglobin dropped this afternoon. She began vomiting bright red blood this evening. GI felt there was no role for endoscopy. I am bringing her emergently to the operating room for oversewing of this ulcer. Informed consent was obtained. She received intravenous antibiotics. She was brought to the operating room. General endotracheal anesthesia was administered by the anesthesia staff. Foley catheter was placed by nursing. Abdomen was prepped and draped in sterile fashion. Upper midline incision was made. Subcutaneous tissues were dissected down revealing the anterior fascia. This was divided sharply along the midline. There was entered under direct vision. Fascia was opened to the length of the incision. We identified the duodenum. We noted blood visible within many loops of small bowel. Coker maneuver was performed freeing up the duodenum from its lateral attachments. Next, stay sutures were placed on the cephalad and caudal portions of the duodenal bulb. A duodenotomy was made and this was extended back across the pylorus. Old blood was  evacuated from the stomach. The ulcer was identified and it was large. On exploration there was active bleeding. It was oversewn with multiple 0 silk sutures achieving excellent hemostasis. The sutures also closed over the ulcer bed. Once we were confident and hemostasis, the duodenotomy was closed in a transverse fashion. Nasogastric tube was confirmed in position. Closure was accomplished with interrupted 2-0 silk sutures. Tissue appeared excellent and in good condition. 19 [place] drain was placed near the repair and brought out through the right abdomen. It was secured with nylon. Abdomen was irrigated. Fascia was closed with running #1 looped PDS from each end, tied in the middle. Subcutaneous tissues were irrigated and the skin was closed with staples. All counts were correct. Patient tolerated the procedure well and was taken to recovery room extubated, in guarded condition. She will return to the intensive care unit.  PATIENT DISPOSITION:  PACU - guarded condition.   Delay start of Pharmacological VTE agent (>24hrs) due to surgical blood loss or risk of bleeding:  yes  Violeta GelinasBurke Rosamaria Donn, MD, MPH, FACS Pager: [phone]  3/8/20169:22 PM

## 2014-05-08 LAB — TYPE AND SCREEN
ABO/RH(D): O POS
Antibody Screen: NEGATIVE
Unit division: 0
Unit division: 0
Unit division: 0
Unit division: 0
Unit division: 0
Unit division: 0

## 2014-05-08 LAB — CBC
HCT: 34.2 % — ABNORMAL LOW (ref 36.0–46.0)
Hemoglobin: 11.7 g/dL — ABNORMAL LOW (ref 12.0–15.0)
MCH: 29 pg (ref 26.0–34.0)
MCHC: 34.2 g/dL (ref 30.0–36.0)
MCV: 84.9 fL (ref 78.0–100.0)
Platelets: 149 10*3/uL — ABNORMAL LOW (ref 150–400)
RBC: 4.03 MIL/uL (ref 3.87–5.11)
RDW: 14.7 % (ref 11.5–15.5)
WBC: 23.5 10*3/uL — ABNORMAL HIGH (ref 4.0–10.5)

## 2014-05-08 LAB — BASIC METABOLIC PANEL
Anion gap: 3 — ABNORMAL LOW (ref 5–15)
BUN: 12 mg/dL (ref 6–23)
CO2: 21 mmol/L (ref 19–32)
Calcium: 7.3 mg/dL — ABNORMAL LOW (ref 8.4–10.5)
Chloride: 112 mmol/L (ref 96–112)
Creatinine, Ser: 0.49 mg/dL — ABNORMAL LOW (ref 0.50–1.10)
GFR calc Af Amer: >90 mL/min (ref >90)
GFR calc non Af Amer: >90 mL/min (ref >90)
Glucose, Bld: 152 mg/dL — ABNORMAL HIGH (ref 70–99)
Potassium: 4 mmol/L (ref 3.5–5.1)
Sodium: 136 mmol/L (ref 135–145)

## 2014-05-08 LAB — HEMOGLOBIN AND HEMATOCRIT, BLOOD
HCT: 28.5 % — ABNORMAL LOW (ref 36.0–46.0)
HCT: 29.2 % — ABNORMAL LOW (ref 36.0–46.0)
HCT: 28.8 % — ABNORMAL LOW (ref 36.0–46.0)
Hemoglobin: 10 g/dL — ABNORMAL LOW (ref 12.0–15.0)
Hemoglobin: 10.1 g/dL — ABNORMAL LOW (ref 12.0–15.0)
Hemoglobin: 9.9 g/dL — ABNORMAL LOW (ref 12.0–15.0)

## 2014-05-08 MED ORDER — POTASSIUM CHLORIDE 2 MEQ/ML IV SOLN
INTRAVENOUS | Status: DC
  Administered 2014-05-08 (×3): via INTRAVENOUS
  Filled 2014-05-08 (×5): qty 1000

## 2014-05-08 MED ORDER — DEXTROSE 5 % IV SOLN
500.0000 mg | Freq: Four times a day (QID) | INTRAVENOUS | Status: DC | PRN
  Administered 2014-05-08 – 2014-05-09 (×2): 500 mg via INTRAVENOUS
  Filled 2014-05-08 (×6): qty 5

## 2014-05-08 MED ORDER — HYDROMORPHONE 0.3 MG/ML IV SOLN
INTRAVENOUS | Status: DC
  Administered 2014-05-08: 2.33 mg via INTRAVENOUS
  Administered 2014-05-08: 2.7 mg via INTRAVENOUS
  Administered 2014-05-08: 0.6 mg via INTRAVENOUS
  Administered 2014-05-08: 1.8 mg via INTRAVENOUS
  Administered 2014-05-08: 01:00:00 via INTRAVENOUS
  Administered 2014-05-08: 1.2 mg via INTRAVENOUS
  Administered 2014-05-08 – 2014-05-09 (×2): 3.3 mg via INTRAVENOUS
  Administered 2014-05-09: 1.2 mg via INTRAVENOUS
  Administered 2014-05-09: 0.3 mg via INTRAVENOUS
  Administered 2014-05-09: 0.75 mg via INTRAVENOUS
  Administered 2014-05-09: 1.5 mg via INTRAVENOUS
  Administered 2014-05-10: 1.2 mg via INTRAVENOUS
  Administered 2014-05-10: 19:00:00 via INTRAVENOUS
  Administered 2014-05-10: 2.1 mg via INTRAVENOUS
  Administered 2014-05-10: 04:00:00 via INTRAVENOUS
  Administered 2014-05-10: 2.4 mg via INTRAVENOUS
  Administered 2014-05-10: 4.5 mg via INTRAVENOUS
  Administered 2014-05-11: 1.3 mg via INTRAVENOUS
  Administered 2014-05-11: 1.8 mg via INTRAVENOUS
  Filled 2014-05-08 (×5): qty 25

## 2014-05-08 MED ORDER — ONDANSETRON HCL 4 MG/2ML IJ SOLN
4.0000 mg | Freq: Four times a day (QID) | INTRAMUSCULAR | Status: DC | PRN
  Administered 2014-05-08 – 2014-05-14 (×16): 4 mg via INTRAVENOUS
  Filled 2014-05-08 (×16): qty 2

## 2014-05-08 MED ORDER — DIPHENHYDRAMINE HCL 12.5 MG/5ML PO ELIX
12.5000 mg | ORAL_SOLUTION | Freq: Four times a day (QID) | ORAL | Status: DC | PRN
  Filled 2014-05-08: qty 5

## 2014-05-08 MED ORDER — DIPHENHYDRAMINE HCL 50 MG/ML IJ SOLN: 12.5000 mg | Freq: Four times a day (QID) | INTRAMUSCULAR | Status: DC | PRN

## 2014-05-08 MED ORDER — NALOXONE HCL 0.4 MG/ML IJ SOLN: 0.4000 mg | INTRAMUSCULAR | Status: DC | PRN

## 2014-05-08 MED ORDER — SODIUM CHLORIDE 0.9 % IJ SOLN: 9.0000 mL | INTRAMUSCULAR | Status: DC | PRN

## 2014-05-08 MED ORDER — FENTANYL 50 MCG/HR TD PT72
50.0000 ug | MEDICATED_PATCH | TRANSDERMAL | Status: DC
  Administered 2014-05-08: 50 ug via TRANSDERMAL
  Filled 2014-05-08: qty 1

## 2014-05-08 MED ORDER — CETYLPYRIDINIUM CHLORIDE 0.05 % MT LIQD
7.0000 mL | Freq: Two times a day (BID) | OROMUCOSAL | Status: DC
  Administered 2014-05-08 – 2014-05-09 (×3): 7 mL via OROMUCOSAL

## 2014-05-08 MED ORDER — PROMETHAZINE HCL 25 MG/ML IJ SOLN
12.5000 mg | INTRAMUSCULAR | Status: DC | PRN
  Administered 2014-05-08 – 2014-05-10 (×8): 12.5 mg via INTRAVENOUS
  Filled 2014-05-08 (×9): qty 1

## 2014-05-08 NOTE — Progress Notes (Signed)
PULMONARY / CRITICAL CARE MEDICINE   Name: Kimberly Weiss MRN: [id number] DOB: 1981/02/26    ADMISSION DATE:  04/26/2014  PRIMARY SERVICE: PCCM  CHIEF COMPLAINT:  Syncope, hematemesis  BRIEF PATIENT DESCRIPTION: 34 y/o woman admitted 2/26 after syncopal episode.   Work up concerning for UGI bleed likely due to NSAID usage. PCCM consulted for ICU admission 2/26 .  Found to have large UGIB d/t duodenal ulcer on EGD  .Initially required 5 u PRBC , tx w/ PPI stable to TRH on 3/1 . Reconsulted 3/6 for  Hypotension/GI bleed. Pt transferred to ICU . IR eval for possible embolization .   SIGNIFICANT EVENTS / STUDIES:  2/26  Admit with syncope prior to presentation, concern for UGIB.  Hgb 8.2 from 15 2/28  No further bleeding, pt seeking pain meds, getting out of bed without assistance 2/28 Massive cratered duodenal bulb ulcer noted on EGD 3/6 Near syncopal episode w/ black stool , sharp drop in hbg and hypotension  3/7 Hg stable, transfer out of the ICU to tele 3/8 Hg unstable, transfer out of tele to the ICU  SUBJECTIVE:  Kimberly Weiss had a recurrent episode of hematemesis this afternoon and a drop in her hemoglobin to 6.8  VITAL SIGNS: Temp:  [97.8 F (36.6 C)-99 F (37.2 C)] 98.2 F (36.8 C) (03/09 0800) Pulse Rate:  [90-170] 107 (03/09 0900) Resp:  [9-20] 14 (03/09 0900) BP: (67-131)/(40-92) 89/68 mmHg (03/09 0900) SpO2:  [99 %-100 %] 100 % (03/09 0800) Weight:  [78 kg (171 lb 15.3 oz)] 78 kg (171 lb 15.3 oz) (03/09 0730)   INTAKE / OUTPUT: Intake/Output      03/08 0701 - 03/09 0700 03/09 0701 - 03/10 0700   P.O. 350    I.V. (mL/kg) 2826.7 (40.5)    Blood 978    NG/GT 60    Total Intake(mL/kg) 4214.7 (60.4)    Urine (mL/kg/hr) 2650 (1.6) 690 (2.8)   Emesis/NG output 50 (0)    Drains 70 (0)    Other 250 (0.1)    Stool     Blood 100 (0.1)    Total Output 3120 690   Net +1094.7 -690         PHYSICAL EXAMINATION: General: sitting up in bed Neuro:  Awake and alert, moving  all four ext HEENT:  Dried blood on fact, NCAT, EOMi Neck: supple Cardiovascular:  Tachy, regular, no mgr Lungs:  CTA B Abdomen:  Soft, hypoactive bowel sounds Musculoskeletal:  Normal bulk and tone Skin:  No rashes  LABS:  CBC  Recent Labs Lab 05/06/14 0400 05/07/14 0500 05/07/14 1255 05/08/14 0023 05/08/14 0840  WBC 9.9 9.9  --  23.5*  --   HGB 9.0* 7.8* 6.8* 11.7* 10.0*  HCT 26.0* 23.1* 20.2* 34.2* 28.5*  PLT 181 217  --  149*  --    Coag's  Recent Labs Lab 05/04/14 0518 05/07/14 2020  APTT 30  --   INR 1.04 1.18   BMET  Recent Labs Lab 05/06/14 1010 05/07/14 0500 05/08/14 0023  NA 135 137 136  K 3.6 3.7 4.0  CL 108 109 112  CO2 BUN CREATININE 0.48* 0.53 0.49*  GLUCOSE 85 102* 152*   Electrolytes  Recent Labs Lab 05/02/14 1442  05/05/14 1800 05/06/14 1010 05/07/14 0500 05/08/14 0023  CALCIUM  --   < >  --  7.5* 7.7* 7.3*  MG 1.6  --  1.8  --  2.0  --  PHOS  --   --   --   --  2.8  --   < > = values in this interval not displayed. Sepsis Markers  Recent Labs Lab 05/05/14 1211  LATICACIDVEN 0.94   Liver Enzymes  Recent Labs Lab 05/04/14 0518  AST 13  ALT 11  ALKPHOS 59  BILITOT 0.5  ALBUMIN 2.3*   Glucose No results for input(s): GLUCAP in the last 168 hours.  Imaging No results found. ASSESSMENT / PLAN: GASTROINTESTINAL A: Severe Upper GI Bleed - likely in setting of NSAID / ETOH usage.  Massive cratered duodenal bulb ulcer noted on EGD 3/6 Rebleed >to go to IR for embolization > unsuccessful 3/8 Rebleed> to OR STAT P:   Continue Protonix gtt Further recommendations per surgery. NPO until cleared by GI surgery  PULMONARY A: Chronic Tobacco Abuse No acute issues P:   Smoking cessation PRN albuterol for wheezing  Pulmonary hygiene Nasal hygiene - flonase + saline spray   CARDIOVASCULAR A: R arm PICC 3/2 >> Hypovolemic shock -Hemorrhagic  P:   Tele Transfuse blood now Monitor  hemodynamics post operatively  RENAL A: Chronic cystitis P:   Monitor BMET and UOP Replace electrolytes as needed   HEMATOLOGIC A: Anemia - due to hemorrhage P:   Transfuse 2PRBC now in setting of massive upper bleed After surgery, Hgb transfusion goal < 7gm/dL  INFECTIOUS A: No active issues P:   Monitor fever curve / WBC  ENDOCRINE A: Hypothyroidism P:   Synthroid 25 mcg IV, transition back to PO once cleared for intake   NEUROLOGIC A: Anxiety BZD dependence Opiate dependence P:   Avoid  sedating rx if possible  Hold elavil, klonopin, percocet 10, effexor for now, restart once able to take PO. Dilaudid PCA per surgery.  Patient with chronic pain, high narcotic demand, informed that we can not completely take her pain away we can only try to make her comfortable but evidently the dilaudid as ordered with PRN fentanyl is not enough.  She is awake and alert now but evidently she is either awake and complaining of pain or unresponsive.  Will transfer to floor on PCA and [facility].  PCCM will sign off 3/10.  Kimberly Weiss, M.D. [facility]. Pager: [phone]. After hours pager: [phone].

## 2014-05-08 NOTE — Progress Notes (Signed)
Patient ID: Kimberly Weiss, female   DOB: June 27, 1980, 34 y.o.   MRN: [id number]     [facility]      [address] [place], [place] [address]    Phone: [phone] FAX: [phone]     Subjective: C/o severe pain, but appears very comfortable with hr of 105 and normal to low bp, and RR at 14.  hgb is stable.  Minimal NGT output.   Objective:  Vital signs:  Filed Vitals:   05/08/14 0600 05/08/14 0700 05/08/14 0730 05/08/14 0800  BP: 108/75 106/51    Pulse: 111 106    Temp:    98.2 F (36.8 C)  TempSrc:    Oral  Resp: '19 14  18  ' Height:   '5\' 3"'  (1.6 m)   Weight:   171 lb 15.3 oz (78 kg)   SpO2: 99% 99%  100%    Last BM Date: 05/06/14  Intake/Output   Yesterday:  03/08 0701 - 03/09 0700 In: 4214.7 [P.O.:350; I.V.:2826.7; Blood:978; NG/GT:60] Out: 0712 [Urine:2650; Emesis/NG output:50; Drains:70; Blood:100] This shift:  Total I/O In: -  Out: 690 [Urine:690]   Physical Exam: General: Pt awake/alert/oriented x4 in no acute distress Abdomen: Soft.  Nondistended.  Mild tenderness at incision site, JP drain with serosanguinous output.  NGT with coffee ground emesis, minimal output recorded.  No evidence of peritonitis.  No incarcerated hernias.    Problem List:   Active Problems:   UGIB (upper gastrointestinal bleed)   Acute blood loss anemia   Head trauma   Tachycardia   Other specified hypotension   Duodenal ulcer   Hypovolemic shock   Tobacco abuse   Other specified hypothyroidism   Anxiety state   GI bleed   Gastrointestinal hemorrhage associated with duodenal ulcer   Duodenal ulcer with hemorrhage   Hemorrhagic shock    Results:   Labs: Results for orders placed or performed during the hospital encounter of 04/26/14 (from the past 48 hour(s))  Basic metabolic panel     Status: Abnormal   Collection Time: 05/06/14 10:10 AM  Result Value Ref Range   Sodium 135 135 - 145 mmol/L   Potassium 3.6 3.5 -  5.1 mmol/L   Chloride 108 96 - 112 mmol/L   CO2 23 19 - 32 mmol/L   Glucose, Bld 85 70 - 99 mg/dL   BUN 14 6 - 23 mg/dL   Creatinine, Ser 0.48 (L) 0.50 - 1.10 mg/dL   Calcium 7.5 (L) 8.4 - 10.5 mg/dL   GFR calc non Af Amer >90 >90 mL/min   GFR calc Af Amer >90 >90 mL/min    Comment: (NOTE) The eGFR has been calculated using the CKD EPI equation. This calculation has not been validated in all clinical situations. eGFR's persistently <90 mL/min signify possible Chronic Kidney Disease.    Anion gap 4 (L) 5 - 15  CBC     Status: Abnormal   Collection Time: 05/07/14  5:00 AM  Result Value Ref Range   WBC 9.9 4.0 - 10.5 K/uL   RBC 2.68 (L) 3.87 - 5.11 MIL/uL   Hemoglobin 7.8 (L) 12.0 - 15.0 g/dL   HCT 23.1 (L) 36.0 - 46.0 %   MCV 86.2 78.0 - 100.0 fL   MCH 29.1 26.0 - 34.0 pg   MCHC 33.8 30.0 - 36.0 g/dL   RDW 15.3 11.5 - 15.5 %   Platelets 217 150 - 400 K/uL  Basic metabolic panel  Status: Abnormal   Collection Time: 05/07/14  5:00 AM  Result Value Ref Range   Sodium 137 135 - 145 mmol/L   Potassium 3.7 3.5 - 5.1 mmol/L   Chloride 109 96 - 112 mmol/L   CO2 24 19 - 32 mmol/L   Glucose, Bld 102 (H) 70 - 99 mg/dL   BUN 14 6 - 23 mg/dL   Creatinine, Ser 0.53 0.50 - 1.10 mg/dL   Calcium 7.7 (L) 8.4 - 10.5 mg/dL   GFR calc non Af Amer >90 >90 mL/min   GFR calc Af Amer >90 >90 mL/min    Comment: (NOTE) The eGFR has been calculated using the CKD EPI equation. This calculation has not been validated in all clinical situations. eGFR's persistently <90 mL/min signify possible Chronic Kidney Disease.    Anion gap 4 (L) 5 - 15  Magnesium     Status: None   Collection Time: 05/07/14  5:00 AM  Result Value Ref Range   Magnesium 2.0 1.5 - 2.5 mg/dL  Phosphorus     Status: None   Collection Time: 05/07/14  5:00 AM  Result Value Ref Range   Phosphorus 2.8 2.3 - 4.6 mg/dL  Hemoglobin and hematocrit, blood     Status: Abnormal   Collection Time: 05/07/14 12:55 PM  Result Value Ref  Range   Hemoglobin 6.8 (LL) 12.0 - 15.0 g/dL    Comment: REPEATED TO VERIFY CRITICAL RESULT CALLED TO, READ BACK BY AND VERIFIED WITH: MILAM,T RN @ 7062 05/07/14 LEONARD,A    HCT 20.2 (L) 36.0 - 46.0 %  Prepare RBC     Status: None   Collection Time: 05/07/14  1:15 PM  Result Value Ref Range   Order Confirmation ORDER PROCESSED BY BLOOD BANK   Prepare RBC     Status: None   Collection Time: 05/07/14  6:55 PM  Result Value Ref Range   Order Confirmation ORDER PROCESSED BY BLOOD BANK   Protime-INR     Status: None   Collection Time: 05/07/14  8:20 PM  Result Value Ref Range   Prothrombin Time 15.2 11.6 - 15.2 seconds   INR 1.18 0.00 - 1.49  CBC     Status: Abnormal   Collection Time: 05/08/14 12:23 AM  Result Value Ref Range   WBC 23.5 (H) 4.0 - 10.5 K/uL   RBC 4.03 3.87 - 5.11 MIL/uL   Hemoglobin 11.7 (L) 12.0 - 15.0 g/dL    Comment: POST TRANSFUSION SPECIMEN   HCT 34.2 (L) 36.0 - 46.0 %   MCV 84.9 78.0 - 100.0 fL   MCH 29.0 26.0 - 34.0 pg   MCHC 34.2 30.0 - 36.0 g/dL   RDW 14.7 11.5 - 15.5 %   Platelets 149 (L) 150 - 400 K/uL    Comment: DELTA CHECK NOTED REPEATED TO VERIFY   Basic metabolic panel     Status: Abnormal   Collection Time: 05/08/14 12:23 AM  Result Value Ref Range   Sodium 136 135 - 145 mmol/L   Potassium 4.0 3.5 - 5.1 mmol/L   Chloride 112 96 - 112 mmol/L   CO2 21 19 - 32 mmol/L   Glucose, Bld 152 (H) 70 - 99 mg/dL   BUN 12 6 - 23 mg/dL   Creatinine, Ser 0.49 (L) 0.50 - 1.10 mg/dL   Calcium 7.3 (L) 8.4 - 10.5 mg/dL   GFR calc non Af Amer >90 >90 mL/min   GFR calc Af Amer >90 >90 mL/min    Comment: (NOTE)  The eGFR has been calculated using the CKD EPI equation. This calculation has not been validated in all clinical situations. eGFR's persistently <90 mL/min signify possible Chronic Kidney Disease.    Anion gap 3 (L) 5 - 15  Hemoglobin and hematocrit, blood     Status: Abnormal   Collection Time: 05/08/14  8:40 AM  Result Value Ref Range    Hemoglobin 10.0 (L) 12.0 - 15.0 g/dL   HCT 28.5 (L) 36.0 - 46.0 %    Imaging / Studies: No results found.  Medications / Allergies:  Scheduled Meds: . sodium chloride   Intravenous Once  . antiseptic oral rinse  7 mL Mouth Rinse BID  . busPIRone  7.5 mg Oral BID  . [MAR Hold]  ceFAZolin (ANCEF) IV  2 g Intravenous Once  . fentaNYL  50 mcg Transdermal Q72H  . fluticasone  2 spray Each Nare Daily  . HYDROmorphone      . HYDROmorphone      . HYDROmorphone PCA 0.3 mg/mL   Intravenous 6 times per day  . levothyroxine  25 mcg Intravenous Daily  . nicotine  14 mg Transdermal Daily  . sodium chloride  1 spray Each Nare BID  . sodium chloride  10-40 mL Intracatheter Q12H  . sucralfate  1 g Oral TID WC & HS   Continuous Infusions: . dextrose 5 % and 0.45% NaCl 1,000 mL with potassium chloride 20 mEq infusion 100 mL/hr at 05/08/14 0700  . pantoprozole (PROTONIX) infusion 8 mg/hr (05/08/14 0700)   PRN Meds:.acetaminophen, albuterol, diphenhydrAMINE **OR** diphenhydrAMINE, HYDROmorphone (DILAUDID) injection, LORazepam, methocarbamol (ROBAXIN)  IV, naloxone **AND** sodium chloride, ondansetron (ZOFRAN) IV, promethazine, sodium chloride  Antibiotics: Anti-infectives    Start     Dose/Rate Route Frequency Ordered Stop   05/07/14 2100  [MAR Hold]  ceFAZolin (ANCEF) IVPB 2 g/50 mL premix     (MAR Hold since 05/07/14 1951)   2 g 100 mL/hr over 30 Minutes Intravenous  Once 05/07/14 1924          Assessment/Plan Bleeding duodenal ulcer POD#1 exploratory laparotomy, oversew of duodenal ulcer -continue dilaudid PCA at full dose, add robaxin, although the patient states, "its like taking candy."  Dr. Candiss Norse has ordered a fentanyl patch, hopefully above changes will improve her pain without oversedating her and compromising her airway especially since she is getting ativan fairly consistently.  I explained to the patient the goals of pain meds which is to make her comfortable in order for her to  mobilize and progress towards discharge adding that we unfortunately cannot completely take her pain away.  -mobilize -IS -SCD -continue NGT to LWIS -leave dressing today, will remove tomorrow -continue IVF -may transfer to floor from surgical standpoint.    Erby Pian, [facility] Pager [phone]) For consults and floor pages call [phone](7A-4:30P)  05/08/2014 9:24 AM

## 2014-05-08 NOTE — Progress Notes (Signed)
Fentanyl PCA was wasted 45ml. Left in the syringe. Witnessed by Scharlene CornK.  Kolangayil JamesRN.

## 2014-05-08 NOTE — Plan of Care (Signed)
Patient with significant upper GI bleed requiring general surgery to operate and cauterize bleeding vessel, multiple units of packed RBC transfusion, under ICU care today, discussed with ICU physician Dr. Molli KnockYacoub she has been seen by ICU team on 05/08/2014. We will resume care on 05/09/2014.

## 2014-05-09 ENCOUNTER — Encounter (HOSPITAL_COMMUNITY): Payer: Self-pay | Admitting: General Surgery

## 2014-05-09 ENCOUNTER — Inpatient Hospital Stay (HOSPITAL_COMMUNITY): Payer: Medicaid Other

## 2014-05-09 LAB — HEMOGLOBIN AND HEMATOCRIT, BLOOD
HCT: 26.2 % — ABNORMAL LOW (ref 36.0–46.0)
HCT: 27.6 % — ABNORMAL LOW (ref 36.0–46.0)
Hemoglobin: 8.8 g/dL — ABNORMAL LOW (ref 12.0–15.0)
Hemoglobin: 9.4 g/dL — ABNORMAL LOW (ref 12.0–15.0)

## 2014-05-09 LAB — BASIC METABOLIC PANEL
Anion gap: 3 — ABNORMAL LOW (ref 5–15)
BUN: <5 mg/dL — ABNORMAL LOW (ref 6–23)
CO2: 28 mmol/L (ref 19–32)
Calcium: 7.8 mg/dL — ABNORMAL LOW (ref 8.4–10.5)
Chloride: 104 mmol/L (ref 96–112)
Creatinine, Ser: 0.61 mg/dL (ref 0.50–1.10)
GFR calc Af Amer: >90 mL/min (ref >90)
GFR calc non Af Amer: >90 mL/min (ref >90)
Glucose, Bld: 135 mg/dL — ABNORMAL HIGH (ref 70–99)
Potassium: 3.5 mmol/L (ref 3.5–5.1)
Sodium: 135 mmol/L (ref 135–145)

## 2014-05-09 LAB — CBC
HCT: 28.2 % — ABNORMAL LOW (ref 36.0–46.0)
Hemoglobin: 9.5 g/dL — ABNORMAL LOW (ref 12.0–15.0)
MCH: 29.5 pg (ref 26.0–34.0)
MCHC: 33.7 g/dL (ref 30.0–36.0)
MCV: 87.6 fL (ref 78.0–100.0)
Platelets: 211 10*3/uL (ref 150–400)
RBC: 3.22 MIL/uL — ABNORMAL LOW (ref 3.87–5.11)
RDW: 16.1 % — ABNORMAL HIGH (ref 11.5–15.5)
WBC: 14.7 10*3/uL — ABNORMAL HIGH (ref 4.0–10.5)

## 2014-05-09 LAB — PHOSPHORUS: Phosphorus: 2.2 mg/dL — ABNORMAL LOW (ref 2.3–4.6)

## 2014-05-09 LAB — MAGNESIUM: Magnesium: 1.8 mg/dL (ref 1.5–2.5)

## 2014-05-09 MED ORDER — SODIUM CHLORIDE 0.9 % IV SOLN
8.0000 mg/h | INTRAVENOUS | Status: DC
  Filled 2014-05-09 (×2): qty 80

## 2014-05-09 MED ORDER — SODIUM CHLORIDE 0.9 % IV SOLN
8.0000 mg/h | INTRAVENOUS | Status: DC
  Administered 2014-05-09 – 2014-05-11 (×2): 8 mg/h via INTRAVENOUS
  Filled 2014-05-09 (×10): qty 80

## 2014-05-09 MED ORDER — DEXTROSE-NACL 5-0.45 % IV SOLN
INTRAVENOUS | Status: DC
  Administered 2014-05-09 – 2014-05-10 (×2): via INTRAVENOUS
  Filled 2014-05-09 (×5): qty 1000

## 2014-05-09 MED ORDER — SODIUM CHLORIDE 0.9 % IV BOLUS (SEPSIS)
500.0000 mL | Freq: Once | INTRAVENOUS | Status: AC
  Administered 2014-05-09: 500 mL via INTRAVENOUS

## 2014-05-09 MED ORDER — PHENOL 1.4 % MT LIQD
1.0000 | OROMUCOSAL | Status: DC | PRN
  Administered 2014-05-09: 1 via OROMUCOSAL
  Filled 2014-05-09: qty 177

## 2014-05-09 MED ORDER — PANTOPRAZOLE SODIUM 40 MG IV SOLR
40.0000 mg | Freq: Two times a day (BID) | INTRAVENOUS | Status: DC
  Administered 2014-05-12 (×2): 40 mg via INTRAVENOUS
  Filled 2014-05-09 (×3): qty 40

## 2014-05-09 MED ORDER — METHOCARBAMOL 1000 MG/10ML IJ SOLN
1000.0000 mg | Freq: Four times a day (QID) | INTRAVENOUS | Status: DC | PRN
  Administered 2014-05-09 – 2014-05-13 (×5): 1000 mg via INTRAVENOUS
  Filled 2014-05-09 (×11): qty 10

## 2014-05-09 NOTE — Progress Notes (Signed)
Paged K. Schorr, on call for [facility], to notify her of patient's BP 88/55, pulse 129. Hgb 9.5, NG drainage dark reddish brown color.

## 2014-05-09 NOTE — Progress Notes (Signed)
[facility] Progress Note  2 Days Post-Op  Subjective: Pt c/o abdominal pain which is persisting.  Wants to get up and walk today.  No N/V, nose tube and ox monitor is bothering her.  Denies flatus or BM yet.  Using IS and up to 1750!  Able to scoot up in bed on her own.  Objective: Vital signs in last 24 hours: Temp:  [98.2 F (36.8 C)-98.6 F (37 C)] 98.3 F (36.8 C) (03/10 0530) Pulse Rate:  [95-129] 129 (03/10 0530) Resp:  [11-23] 14 (03/10 0530) BP: (85-120)/(52-76) 88/55 mmHg (03/10 0530) SpO2:  [95 %-100 %] 100 % (03/10 0530) Last BM Date: 05/06/14  Intake/Output from previous day: 03/09 0701 - 03/10 0700 In: 3035 [I.V.:3035] Out: 4255 [Urine:3600; Emesis/NG output:610; Drains:45] Intake/Output this shift:    PE: Gen:  Alert, NAD, pleasant Card:  RRR, no M/G/R heard Pulm:  CTA, no W/R/R, IS to 1750 Abd: Soft, mildly distended, appropriately tender (not significantly tender to palpation) pain subjectively out of proportion to exam, +BS, no HSM, incisions C/D/I with staples in place, drain with serosanguinous drainage (45mL), NG with 62660mL/24 hr which is dark red and bilious   Lab Results:   Recent Labs  05/08/14 0023  05/08/14 2120 05/09/14 0345  WBC 23.5*  --   --  14.7*  HGB 11.7*  < > 9.9* 9.5*  HCT 34.2*  < > 28.8* 28.2*  PLT 149*  --   --  211  < > = values in this interval not displayed. BMET  Recent Labs  05/08/14 0023 05/09/14 0310  NA 136 PENDING  K 4.0 PENDING  CL 112 PENDING  CO2 21 PENDING  GLUCOSE 152* PENDING  BUN 12 <5*  CREATININE 0.49* PENDING  CALCIUM 7.3* PENDING   PT/INR  Recent Labs  05/07/14 2020  LABPROT 15.2  INR 1.18   CMP     Component Value Date/Time   NA PENDING 05/09/2014 0310   K PENDING 05/09/2014 0310   CL PENDING 05/09/2014 0310   CO2 PENDING 05/09/2014 0310   GLUCOSE PENDING 05/09/2014 0310   BUN <5* 05/09/2014 0310   CREATININE PENDING 05/09/2014 0310   CALCIUM PENDING 05/09/2014 0310    PROT 4.6* 05/04/2014 0518   ALBUMIN 2.3* 05/04/2014 0518   AST 13 05/04/2014 0518   ALT 11 05/04/2014 0518   ALKPHOS 59 05/04/2014 0518   BILITOT 0.5 05/04/2014 0518   GFRNONAA PENDING 05/09/2014 0310   GFRAA PENDING 05/09/2014 0310   Lipase     Component Value Date/Time   LIPASE 28 04/26/2014 2314       Studies/Results: No results found.  Anti-infectives: Anti-infectives    Start     Dose/Rate Route Frequency Ordered Stop   05/07/14 2100  [MAR Hold]  ceFAZolin (ANCEF) IVPB 2 g/50 mL premix  Status:  Discontinued     (MAR Hold since 05/07/14 1951)   2 g 100 mL/hr over 30 Minutes Intravenous  Once 05/07/14 1924 05/09/14 0720       Assessment/Plan Bleeding duodenal ulcer s/p POD #2 exploratory laparotomy, oversew of duodenal ulcer -Continue dilaudid PCA at full dose, increase robaxin dose and fentanyl-BP too low to give any more narcotics, she has had a h/o narcotic abuse as well which makes her very challenging to control pain -Add ice to incision, heat to back -Mobilize to chair/rocking chair and in halls, IS, SCD's -Continue NGT to LWIS until bowel function returns, output 660 dark old blood and bile -Change midline  dressing to smaller dressing may be able to remove completely tomorrow if not drainage -continue IVF -On floor -Await bowel function, but may need UGI prior to removal of NG and start of PO intake -Staples to be removed on 05/17/14 Leukocytosis - 14.7  -likely reaction to surgery  ABL anemia  -Continue to monitor but seems to be stablilizing Chronic tobacco use Hypothyroidism Anxiety 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

## 2014-05-09 NOTE — Evaluation (Signed)
Physical Therapy Evaluation and Discharge Patient Details Name: Kimberly Weiss MRN: [id number] DOB: 04/05/1980 Today's Date: 05/09/2014   History of Present Illness  34 y/o woman admitted 2/26 after syncopal episode. Work up concerning for UGI bleed likely due to NSAID usage. PCCM consulted for ICU admission 2/26 . Found to have large UGIB d/t duodenal ulcer on EGD .Initially required 5 u PRBC 3/6  Hypotension/GI bleed. Pt transferred to ICU . IR for embolization 3/6. 3/8 exploratory laparotomy for duodenal ulcer oversew    Clinical Impression  Patient evaluated by Physical Therapy with no further acute PT needs identified (see also PT eval of 05/06/14). All education has been completed and the patient has no further questions. Pt aware she needs to walk in halls at least 3x/day and to ask for nursing assistance. RN made aware that patient has no PT needs.  PT is signing off. Thank you for this referral.     Follow Up Recommendations No PT follow up    Equipment Recommendations  None recommended by PT    Recommendations for Other Services       Precautions / Restrictions Precautions Precautions: None      Mobility  Bed Mobility Overal bed mobility: Modified Independent Bed Mobility: Rolling;Sidelying to Sit;Sit to Supine Rolling: Modified independent (Device/Increase time) Sidelying to sit: Modified independent (Device/Increase time);HOB elevated   Sit to supine: Modified independent (Device/Increase time);HOB elevated   General bed mobility comments: Encouraged HOB flat and no rail, however pt resistant; Educated on technique to minimize abd strain and pt performed well  Transfers Overall transfer level: Modified independent Equipment used: None Transfers: Sit to/from Stand Sit to Stand: Modified independent (Device/Increase time)            Ambulation/Gait Ambulation/Gait assistance: Supervision Ambulation Distance (Feet): 400 Feet Assistive device: None Gait  Pattern/deviations: WFL(Within Functional Limits) Gait velocity: decr Gait velocity interpretation: Below normal speed for age/gender General Gait Details: Pt required supervision for IV pole (pt did not hold it requiring 2nd person); Educated on benefits of walking and instructed to walk 3x/day  Information systems managertairs            Wheelchair Mobility    Modified Rankin (Stroke Patients Only)       Balance Overall balance assessment: No apparent balance deficits (not formally assessed)                                           Pertinent Vitals/Pain Pain Assessment: 0-10 Pain Score: 10-Worst pain ever Pain Location: abd Pain Intervention(s): Limited activity within patient's tolerance;Monitored during session;Premedicated before session;Repositioned;PCA encouraged    Home Living Family/patient expects to be discharged to:: Private residence Living Arrangements: Children;Parent Available Help at Discharge: Family;Available PRN/intermittently Type of Home: Mobile home Home Access: Stairs to enter Entrance Stairs-Rails: None Entrance Stairs-Number of Steps: 5 Home Layout: One level Home Equipment: None      Prior Function Level of Independence: Independent               Hand Dominance        Extremity/Trunk Assessment   Upper Extremity Assessment: Overall WFL for tasks assessed           Lower Extremity Assessment: Overall WFL for tasks assessed         Communication   Communication: No difficulties  Cognition Arousal/Alertness: Awake/alert Behavior During Therapy: WFL for tasks assessed/performed Overall  Cognitive Status: Within Functional Limits for tasks assessed                      General Comments      Exercises Other Exercises Other Exercises: Pt reports she has been doing the exercise program given to her 05/06/14 1-2 times per day. Educated to substitute exercises for a walk in the hall if staff unable to assist her with  ambulation x 3 per day.      Assessment/Plan    PT Assessment Patent does not need any further PT services  PT Diagnosis Acute pain   PT Problem List    PT Treatment Interventions     PT Goals (Current goals can be found in the Care Plan section) Acute Rehab PT Goals Patient Stated Goal: return home for son's birthday by 01-Jun-2022 PT Goal Formulation: All assessment and education complete, DC therapy    Frequency     Barriers to discharge        Co-evaluation               End of Session   Activity Tolerance: Patient tolerated treatment well Patient left: in bed;with call bell/phone within reach (refused to try chair) Nurse Communication: Mobility status (d/c from PT)         Time: 1610-9604 PT Time Calculation (min) (ACUTE ONLY): 28 min   Charges:   PT Evaluation $Initial PT Evaluation Tier I: 1 Procedure PT Treatments $Gait Training: 8-22 mins   PT G Codes:        Kimberly Weiss 2014-06-01, 3:53 PM Pager [phone]

## 2014-05-09 NOTE — Progress Notes (Signed)
PT Cancellation Note  Patient Details Name: Kimberly Weiss MRN: [id number] DOB: 03/23/1980   Cancelled Treatment:    Reason Eval/Treat Not Completed: Patient at procedure or test/unavailable. Pt bathing. Will attempt to see later today.   Kimberly Weiss 05/09/2014, 2:08 PM Pager [phone]

## 2014-05-09 NOTE — Anesthesia Postprocedure Evaluation (Signed)
  Anesthesia Post-op Note  Patient: Kimberly Weiss  Procedure(s) Performed: Procedure(s): EXPLORATORY LAPAROTOMY (N/A) OVERSOWE DUODENAL ULCER (N/A)  Patient Location: PACU  Anesthesia Type:General  Level of Consciousness: awake and alert   Airway and Oxygen Therapy: Patient Spontanous Breathing  Post-op Pain: moderate  Post-op Assessment: Post-op Vital signs reviewed, Patient's Cardiovascular Status Stable and Respiratory Function Stable  Post-op Vital Signs: Reviewed  Filed Vitals:   05/09/14 0530  BP: 88/55  Pulse: 129  Temp: 36.8 C  Resp: 14    Complications: No apparent anesthesia complications

## 2014-05-09 NOTE — Progress Notes (Signed)
Patient Demographics  Kimberly Weiss, is a 34 y.o. female, DOB - [birth year], WIO:[id number]  Admit date - 04/26/2014   Admitting Physician Colbert Coyer, MD  Outpatient Primary MD for the patient is LUKING,SCOTT, MD  LOS - 12   Chief Complaint  Patient presents with  . Hypotension      Summary  34 y/o woman admitted 2/26 after syncopal episode. Work up concerning for UGI bleed likely due to NSAID usage. PCCM consulted for ICU admission 2/26 . Found to have large UGIB d/t duodenal ulcer on EGD .Initially required 5 u PRBC , tx w/ PPI stable to Blunt on 3/1 . Reconsulted 3/6 for Hypotension/GI bleed. Pt transferred to ICU . IR eval for possible embolization which failed, she was subsequently taken to or and underwent exploratory lap with suturing of bleeding duodenal ulcer. She was stabilized in ICU and transferred under my care on 05/09/2014.   Subjective:   Kimberly Weiss today has, No headache, No chest pain,  No new weakness tingling or numbness, No Cough - SOB.  Sleeping in bed, on Dilaudid PCA, somnolent, aroused, complains of 10 out of 10 pain and then goes back to sleep.  Assessment & Plan    1. Severe upper GI bleed with blood loss treated anemia secondary to duodenal ulcer. Seen by GI, general surgery and critical care. Status post exploratory laparotomy with overseas of duodenal ulcer by general surgery on 05/07/2014. Has received over 5 units of RBCs this admission, H&H now stable, continue IV PPI, continue nothing by mouth. Diet advanced per general surgery.    2. Hypovolemic hemorrhagic shock and hypotension. Currently resolved. Monitor H&H gentle IV fluids as nothing by mouth.    3. Chronic tobacco abuse, alcohol use. Counseled to quit both.    4. Hypothyroidism on  IV Synthroid.    5. Chr back pain. Has tremendous narcotic tolerance. Does show some narcotic seeking behavior. On Dilaudid PCA, somnolent and sleepy, wakes up and asks for more IV narcotics, politely refused as it's becoming dangerous.   6. Mild leukocytosis. Likely reactionary. We will repeat 2 view chest x-ray, UA.    Patient encouraged to increase activity, encouraged to sit up in the bed, ambulating in the hallway and work with PT. Will provide IS     Code Status: Full  Family Communication: none present  Disposition Plan: Home   Procedures   2/26 Admit with syncope prior to presentation, concern for UGIB. Hgb 8.2 from 15 2/28 No further bleeding, pt seeking pain meds, getting out of bed without assistance 2/28 Massive cratered duodenal bulb ulcer noted on EGD 3/6 Near syncopal episode w/ black stool , sharp drop in hbg and hypotension  3/7 Hg stable, transfer out of the ICU to tele 3/8 Hg unstable, transfer out of tele to the ICU 05-07-14 - Exploratory laparotomy, oversew of duodenal ulcer   Consults  PCCM, GI, CCS   Medications  Scheduled Meds: . antiseptic oral rinse  7 mL Mouth Rinse BID  . busPIRone  7.5 mg Oral BID  . fentaNYL  50 mcg Transdermal Q72H  . fluticasone  2 spray Each Nare Daily  . HYDROmorphone PCA 0.3 mg/mL   Intravenous 6 times per day  . levothyroxine  25  mcg Intravenous Daily  . nicotine  14 mg Transdermal Daily  . sodium chloride  1 spray Each Nare BID  . sodium chloride  10-40 mL Intracatheter Q12H  . sucralfate  1 g Oral TID WC & HS   Continuous Infusions: . dextrose 5 % and 0.45% NaCl 1,000 mL with potassium chloride 20 mEq infusion     PRN Meds:.acetaminophen, albuterol, diphenhydrAMINE **OR** diphenhydrAMINE, HYDROmorphone (DILAUDID) injection, LORazepam, methocarbamol (ROBAXIN)  IV, naloxone **AND** sodium chloride, ondansetron (ZOFRAN) IV, promethazine, sodium chloride  DVT Prophylaxis   SCDs    Lab Results  Component  Value Date   PLT 211 05/09/2014    Antibiotics     Anti-infectives    Start     Dose/Rate Route Frequency Ordered Stop   05/07/14 2100  [MAR Hold]  ceFAZolin (ANCEF) IVPB 2 g/50 mL premix  Status:  Discontinued     (MAR Hold since 05/07/14 1951)   2 g 100 mL/hr over 30 Minutes Intravenous  Once 05/07/14 1924 05/09/14 0720          Objective:   Filed Vitals:   05/09/14 0213 05/09/14 0448 05/09/14 0530 05/09/14 0841  BP: 85/53  88/55   Pulse: 123  129   Temp: 98.2 F (36.8 C)  98.3 F (36.8 C)   TempSrc: Oral  Oral   Resp: _0 Height:      Weight:      SpO2: 97% 100% 100% 90%    Wt Readings from Last 3 Encounters:  05/08/14 78 kg (171 lb 15.3 oz)  02/04/14 68.493 kg (151 lb)  01/04/14 68.493 kg (151 lb)     Intake/Output Summary (Last 24 hours) at 05/09/14 1036 Last data filed at 05/09/14 0600  Gross per 24 hour  Intake   2050 ml  Output   3565 ml  Net  -1515 ml     Physical Exam  Somnolent, easily arousable, answers questions and follows commands, No new F.N deficits, Normal affect [place].AT,PERRAL Supple Neck,No JVD, No cervical lymphadenopathy appriciated.  Symmetrical Chest wall movement, Good air movement bilaterally, CTAB RRR,No Gallops,Rubs or new Murmurs, No Parasternal Heave +ve B.Sounds, Abd Soft, No tenderness, No organomegaly appriciated, No rebound - guarding or rigidity.  Abdominal incision site under bandage with JP drain in place. Foley catheter in place. No Cyanosis, Clubbing or edema, No new Rash or bruise      Data Review   Micro Results No results found for this or any previous visit (from the past 240 hour(s)).  Radiology Reports Ct Head Wo Contrast  04/29/2014   CLINICAL DATA:  Fall. Hit left-sided head on dry air. Headaches. Neck pain.  EXAM: CT HEAD WITHOUT CONTRAST  CT CERVICAL SPINE WITHOUT CONTRAST  TECHNIQUE: Multidetector CT imaging of the head and cervical spine was performed following the standard protocol without  intravenous contrast. Multiplanar CT image reconstructions of the cervical spine were also generated.  COMPARISON:  None.  FINDINGS: CT HEAD FINDINGS  No acute cortical infarct, hemorrhage, or mass lesion ispresent. Ventricles are of normal size. No significant extra-axial fluid collection is present. The paranasal sinuses andmastoid air cells are clear. The osseous skull is intact.  CT CERVICAL SPINE FINDINGS  There is reversal of normal cervical lordosis which may reflect muscle spasm or patient positioning. The vertebral body heights are well preserved. The facet joints are all well aligned. The prevertebral soft tissue space is normal.  IMPRESSION: 1. No acute intracranial abnormalities. 2. Reversal of normal cervical  lordosis which may reflect muscle spasm or patient positioning.   Electronically Signed   By: Kerby Moors M.D.   On: 04/29/2014 16:00   Ct Cervical Spine Wo Contrast  04/29/2014   CLINICAL DATA:  Fall. Hit left-sided head on dry air. Headaches. Neck pain.  EXAM: CT HEAD WITHOUT CONTRAST  CT CERVICAL SPINE WITHOUT CONTRAST  TECHNIQUE: Multidetector CT imaging of the head and cervical spine was performed following the standard protocol without intravenous contrast. Multiplanar CT image reconstructions of the cervical spine were also generated.  COMPARISON:  None.  FINDINGS: CT HEAD FINDINGS  No acute cortical infarct, hemorrhage, or mass lesion ispresent. Ventricles are of normal size. No significant extra-axial fluid collection is present. The paranasal sinuses andmastoid air cells are clear. The osseous skull is intact.  CT CERVICAL SPINE FINDINGS  There is reversal of normal cervical lordosis which may reflect muscle spasm or patient positioning. The vertebral body heights are well preserved. The facet joints are all well aligned. The prevertebral soft tissue space is normal.  IMPRESSION: 1. No acute intracranial abnormalities. 2. Reversal of normal cervical lordosis which may reflect muscle  spasm or patient positioning.   Electronically Signed   By: Kerby Moors M.D.   On: 04/29/2014 16:00   Ir Angiogram Visceral Selective  05/05/2014   INDICATION: 34 year old female admitted to the hospital with upper GI bleeding and hematemesis.  She was admitted April 26, 2013. Upper endoscopy demonstrates duodenum ulcer.  She has been stable over 1 week, though developed syncopal episode and acute anemia with a hemoglobin of 5.7 on today's date. She has been referred for evaluation and treatment of the duodenum ulcer hemorrhage.  EXAM: 1. AORTIC ANGIOGRAM 2. SUPERIOR MESENTERIC ARTERIOGRAM 3. SUB SELECTION OF FOUR TERTIARY BRANCHES FROM THE SUPERIOR MESENTERIC ARTERY 4. ULTRASOUND GUIDED VASCULAR ACCESS OF THE RIGHT COMMON FEMORAL ARTERY 5. MANUAL PRESSURE FOR HEMOSTASIS.  COMPARISON:  CT abdomen pelvis, 04/27/2013, 09/22/2001  MEDICATIONS: Versed 2.0 mg IV; Fentanyl 100 mcg IV  CONTRAST:  155m OMNIPAQUE IOHEXOL 300 MG/ML  SOLN  ANESTHESIA/SEDATION: One hundred fifty minutes  FLUOROSCOPY TIME:  Forty-one minutes.  Fifty-four seconds.  2941 mGy  ACCESS: Right common femoral artery; hemostasis achieved with manual compression.  COMPLICATIONS: None immediate  TECHNIQUE: Informed written consent was obtained from the patient after a discussion of the risks, benefits and alternatives to treatment. Questions regarding the procedure were encouraged and answered. A timeout was performed prior to the initiation of the procedure.  The right groin was prepped and draped in the usual sterile fashion, and a sterile drape was applied covering the operative field. Maximum barrier sterile technique with sterile gowns and gloves were used for the procedure. A timeout was performed prior to the initiation of the procedure. Local anesthesia was provided with 1% lidocaine.  Physical exam was performed to identify the location of the right common femoral artery. Ultrasound survey was performed with images stored sent to PACs.   Once the common femoral artery was identified under ultrasound, ultrasound guidance was used to generously infiltrate the skin and subcutaneous tissues with 1% lidocaine without epinephrine. A micropuncture access was then used to puncture the right common femoral artery. With excellent blood flow returned, and micro wire was advanced through the needle observed under fluoroscopy to enter the iliac system. The needle was removed from wire a micropuncture kit was advanced over the wire. The inner dilator and cannula were removed with a wire, and an 035 Bentson wire was advanced into the  abdominal aorta under fluoroscopy the micro puncture was removed, and a 5 French sheath was advanced over the Bentson wire into the common femoral artery. The dilator was removed and the sheath was flushed.  A pigtail Flush catheter was advanced over the Bentson wire into the lower thoracic aorta. Aortic flush angiogram performed.  Pigtail catheter was then exchanged over wire for C2 Cobra catheter which was used to select the superior mesenteric artery. Superior mesenteric artery angiogram was performed.  Subsequently, a combination of the micro catheter a micro wire were used to select various branches of superior mesenteric artery in this patient with a celiacomesenteric trunk. A standard renegade micro catheter as well as a soft synchro an a standard .016 synchro wire were used to select branches of the superior mesenteric artery. Sub selection included:  Replaced common hepatic artery.  Right hepatic artery.  New middle colicky artery.  Proximal jejunal branches.  Two base catheter exchanges were made on the Bentson wire including exchange for Mickelson catheter and exchanged for a C2 Cobra catheter for formation of a Waltman loop.  After dedicated angiogram of each of the sub selected arteries, all catheters and wires were removed.  Angiogram through the sheath was performed with the common femoral artery.  Deployment of an  Exoseal device for hemostasis failed without deployment. Manual pressure was used for hemostasis of the right common femoral artery.  Patient tolerated procedure well and remained hemodynamically stable throughout. At the end of the procedure, the patient's vital signs were within normal range. Systolic brought pressure 110. Heart rate of 80-90. No syncopal events, no altered mental status, no hematemesis, and no hematochezia/melena.  Marland Kitchen  FINDINGS: Aortic angiogram:  Normal course caliber and contour of the abdominal aorta. Multiple segmental vessels fill within the lower thoracic spine and lumbar spine. No aneurysm dissection flap identified. No significant atherosclerotic changes.  Steep angled take-off of the bilateral common iliac arteries.  Single bilateral renal arteries.  No celiac artery origin.  Superior mesenteric artery patent, with configuration of a celiacomesenteric trunk.  Late phase of the aortic rind demonstrates no evidence of bleeding.  Celiacomesenteric angiogram:  Normal caliber and contour of the origin of the trunk. Multiple jejunal branches present. Normal caliber and contour of the ileocolic artery. Multiple tortuous vessels overlie the proximal 3rd of the trunk, with evidence of a replaced common hepatic artery contributing to both right and left hepatic arteries. There is a replaced vessel representing a celiac vessel, with cross-filling at a bifurcation to the splenic artery and the hepatic arteries. Patent left gastric artery. New  Late phase of the trunk injection demonstrates no evidence of bleeding. No tumor blush or pooling of contrast. Patent portal venous system identified.  Common hepatic artery injection:  Replaced common hepatic artery with right hepatic arteries and left hepatic arteries from the replaced trunk. No evidence of tumor blush, aneurysm, dissection flap, pulling of contrast. No abnormal enhancement identified. No pseudoaneurysm identified.  No call it artery  injection:  Patent middle call it artery of normal caliber. Significant cross flow from collateral vessels with no pooling of contrast, pseudoaneurysm, or tumor blush.  Proximal jejunal arcade:  Normal course caliber and contour. Normal blush of the proximal jejunal mucosa. No filling defects, or pseudoaneurysm.  No embolization was performed.  IMPRESSION: Status post diagnostic mesenteric angiogram.  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  No bleeding was identified from the replaced common hepatic artery, which would be  the best candidate for supplying the pancreaticoduodenal arteries near the known proximal duodenum ulcer. Because no bleeding was identified of this variant anatomy, no empiric embolization was performed.  Signed,  Dulcy Fanny. Earleen Newport, DO  [facility]  [facility]  PLAN: The patient will require 6 hours flattened bed with log roll only for hemostasis of the right common femoral artery after manual compression.  Agree with ICU management, including serial hemoglobin and hematocrit checks.   Electronically Signed   By: Corrie Mckusick D.O.   On: 05/05/2014 16:37   Ir Angiogram Selective Each Additional Vessel  05/05/2014   INDICATION: 34 year old female admitted to the hospital with upper GI bleeding and hematemesis.  She was admitted April 26, 2013. Upper endoscopy demonstrates duodenum ulcer.  She has been stable over 1 week, though developed syncopal episode and acute anemia with a hemoglobin of 5.7 on today's date. She has been referred for evaluation and treatment of the duodenum ulcer hemorrhage.  EXAM: 1. AORTIC ANGIOGRAM 2. SUPERIOR MESENTERIC ARTERIOGRAM 3. SUB SELECTION OF FOUR TERTIARY BRANCHES FROM THE SUPERIOR MESENTERIC ARTERY 4. ULTRASOUND GUIDED VASCULAR ACCESS OF THE RIGHT COMMON FEMORAL ARTERY 5. MANUAL PRESSURE FOR HEMOSTASIS.  COMPARISON:  CT abdomen pelvis,  04/27/2013, 09/22/2001  MEDICATIONS: Versed 2.0 mg IV; Fentanyl 100 mcg IV  CONTRAST:  162m OMNIPAQUE IOHEXOL 300 MG/ML  SOLN  ANESTHESIA/SEDATION: One hundred fifty minutes  FLUOROSCOPY TIME:  Forty-one minutes.  Fifty-four seconds.  2941 mGy  ACCESS: Right common femoral artery; hemostasis achieved with manual compression.  COMPLICATIONS: None immediate  TECHNIQUE: Informed written consent was obtained from the patient after a discussion of the risks, benefits and alternatives to treatment. Questions regarding the procedure were encouraged and answered. A timeout was performed prior to the initiation of the procedure.  The right groin was prepped and draped in the usual sterile fashion, and a sterile drape was applied covering the operative field. Maximum barrier sterile technique with sterile gowns and gloves were used for the procedure. A timeout was performed prior to the initiation of the procedure. Local anesthesia was provided with 1% lidocaine.  Physical exam was performed to identify the location of the right common femoral artery. Ultrasound survey was performed with images stored sent to PACs.  Once the common femoral artery was identified under ultrasound, ultrasound guidance was used to generously infiltrate the skin and subcutaneous tissues with 1% lidocaine without epinephrine. A micropuncture access was then used to puncture the right common femoral artery. With excellent blood flow returned, and micro wire was advanced through the needle observed under fluoroscopy to enter the iliac system. The needle was removed from wire a micropuncture kit was advanced over the wire. The inner dilator and cannula were removed with a wire, and an 035 Bentson wire was advanced into the abdominal aorta under fluoroscopy the micro puncture was removed, and a 5 French sheath was advanced over the Bentson wire into the common femoral artery. The dilator was removed and the sheath was flushed.  A pigtail Flush  catheter was advanced over the Bentson wire into the lower thoracic aorta. Aortic flush angiogram performed.  Pigtail catheter was then exchanged over wire for C2 Cobra catheter which was used to select the superior mesenteric artery. Superior mesenteric artery angiogram was performed.  Subsequently, a combination of the micro catheter a micro wire were used to select various branches of superior mesenteric artery in this patient with a celiacomesenteric trunk. A standard renegade micro catheter as well as  a soft synchro an a standard .016 synchro wire were used to select branches of the superior mesenteric artery. Sub selection included:  Replaced common hepatic artery.  Right hepatic artery.  New middle colicky artery.  Proximal jejunal branches.  Two base catheter exchanges were made on the Bentson wire including exchange for Mickelson catheter and exchanged for a C2 Cobra catheter for formation of a Waltman loop.  After dedicated angiogram of each of the sub selected arteries, all catheters and wires were removed.  Angiogram through the sheath was performed with the common femoral artery.  Deployment of an Exoseal device for hemostasis failed without deployment. Manual pressure was used for hemostasis of the right common femoral artery.  Patient tolerated procedure well and remained hemodynamically stable throughout. At the end of the procedure, the patient's vital signs were within normal range. Systolic brought pressure 110. Heart rate of 80-90. No syncopal events, no altered mental status, no hematemesis, and no hematochezia/melena.  Marland Kitchen  FINDINGS: Aortic angiogram:  Normal course caliber and contour of the abdominal aorta. Multiple segmental vessels fill within the lower thoracic spine and lumbar spine. No aneurysm dissection flap identified. No significant atherosclerotic changes.  Steep angled take-off of the bilateral common iliac arteries.  Single bilateral renal arteries.  No celiac artery origin.   Superior mesenteric artery patent, with configuration of a celiacomesenteric trunk.  Late phase of the aortic rind demonstrates no evidence of bleeding.  Celiacomesenteric angiogram:  Normal caliber and contour of the origin of the trunk. Multiple jejunal branches present. Normal caliber and contour of the ileocolic artery. Multiple tortuous vessels overlie the proximal 3rd of the trunk, with evidence of a replaced common hepatic artery contributing to both right and left hepatic arteries. There is a replaced vessel representing a celiac vessel, with cross-filling at a bifurcation to the splenic artery and the hepatic arteries. Patent left gastric artery. New  Late phase of the trunk injection demonstrates no evidence of bleeding. No tumor blush or pooling of contrast. Patent portal venous system identified.  Common hepatic artery injection:  Replaced common hepatic artery with right hepatic arteries and left hepatic arteries from the replaced trunk. No evidence of tumor blush, aneurysm, dissection flap, pulling of contrast. No abnormal enhancement identified. No pseudoaneurysm identified.  No call it artery injection:  Patent middle call it artery of normal caliber. Significant cross flow from collateral vessels with no pooling of contrast, pseudoaneurysm, or tumor blush.  Proximal jejunal arcade:  Normal course caliber and contour. Normal blush of the proximal jejunal mucosa. No filling defects, or pseudoaneurysm.  No embolization was performed.  IMPRESSION: Status post diagnostic mesenteric angiogram.  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  No bleeding was identified from the replaced common hepatic artery, which would be the best candidate for supplying the pancreaticoduodenal arteries near the known proximal duodenum ulcer. Because no bleeding was identified of this variant anatomy, no empiric embolization was performed.  Signed,  Dulcy Fanny. Earleen Newport, DO  [facility]  [facility]iology  PLAN: The patient will require 6 hours flattened bed with log roll only for hemostasis of the right common femoral artery after manual compression.  Agree with ICU management, including serial hemoglobin and hematocrit checks.   Electronically Signed   By: Corrie Mckusick D.O.   On: 05/05/2014 16:37   Ir Angiogram Selective Each Additional Vessel  05/05/2014   INDICATION: 34 year old female admitted to the hospital with upper  GI bleeding and hematemesis.  She was admitted April 26, 2013. Upper endoscopy demonstrates duodenum ulcer.  She has been stable over 1 week, though developed syncopal episode and acute anemia with a hemoglobin of 5.7 on today's date. She has been referred for evaluation and treatment of the duodenum ulcer hemorrhage.  EXAM: 1. AORTIC ANGIOGRAM 2. SUPERIOR MESENTERIC ARTERIOGRAM 3. SUB SELECTION OF FOUR TERTIARY BRANCHES FROM THE SUPERIOR MESENTERIC ARTERY 4. ULTRASOUND GUIDED VASCULAR ACCESS OF THE RIGHT COMMON FEMORAL ARTERY 5. MANUAL PRESSURE FOR HEMOSTASIS.  COMPARISON:  CT abdomen pelvis, 04/27/2013, 09/22/2001  MEDICATIONS: Versed 2.0 mg IV; Fentanyl 100 mcg IV  CONTRAST:  130m OMNIPAQUE IOHEXOL 300 MG/ML  SOLN  ANESTHESIA/SEDATION: One hundred fifty minutes  FLUOROSCOPY TIME:  Forty-one minutes.  Fifty-four seconds.  2941 mGy  ACCESS: Right common femoral artery; hemostasis achieved with manual compression.  COMPLICATIONS: None immediate  TECHNIQUE: Informed written consent was obtained from the patient after a discussion of the risks, benefits and alternatives to treatment. Questions regarding the procedure were encouraged and answered. A timeout was performed prior to the initiation of the procedure.  The right groin was prepped and draped in the usual sterile fashion, and a sterile drape was applied covering the operative field. Maximum barrier sterile technique with sterile gowns and gloves  were used for the procedure. A timeout was performed prior to the initiation of the procedure. Local anesthesia was provided with 1% lidocaine.  Physical exam was performed to identify the location of the right common femoral artery. Ultrasound survey was performed with images stored sent to PACs.  Once the common femoral artery was identified under ultrasound, ultrasound guidance was used to generously infiltrate the skin and subcutaneous tissues with 1% lidocaine without epinephrine. A micropuncture access was then used to puncture the right common femoral artery. With excellent blood flow returned, and micro wire was advanced through the needle observed under fluoroscopy to enter the iliac system. The needle was removed from wire a micropuncture kit was advanced over the wire. The inner dilator and cannula were removed with a wire, and an 035 Bentson wire was advanced into the abdominal aorta under fluoroscopy the micro puncture was removed, and a 5 French sheath was advanced over the Bentson wire into the common femoral artery. The dilator was removed and the sheath was flushed.  A pigtail Flush catheter was advanced over the Bentson wire into the lower thoracic aorta. Aortic flush angiogram performed.  Pigtail catheter was then exchanged over wire for C2 Cobra catheter which was used to select the superior mesenteric artery. Superior mesenteric artery angiogram was performed.  Subsequently, a combination of the micro catheter a micro wire were used to select various branches of superior mesenteric artery in this patient with a celiacomesenteric trunk. A standard renegade micro catheter as well as a soft synchro an a standard .016 synchro wire were used to select branches of the superior mesenteric artery. Sub selection included:  Replaced common hepatic artery.  Right hepatic artery.  New middle colicky artery.  Proximal jejunal branches.  Two base catheter exchanges were made on the Bentson wire including  exchange for Mickelson catheter and exchanged for a C2 Cobra catheter for formation of a Waltman loop.  After dedicated angiogram of each of the sub selected arteries, all catheters and wires were removed.  Angiogram through the sheath was performed with the common femoral artery.  Deployment of an Exoseal device for hemostasis failed without deployment. Manual pressure was used for hemostasis of the  right common femoral artery.  Patient tolerated procedure well and remained hemodynamically stable throughout. At the end of the procedure, the patient's vital signs were within normal range. Systolic brought pressure 110. Heart rate of 80-90. No syncopal events, no altered mental status, no hematemesis, and no hematochezia/melena.  Marland Kitchen  FINDINGS: Aortic angiogram:  Normal course caliber and contour of the abdominal aorta. Multiple segmental vessels fill within the lower thoracic spine and lumbar spine. No aneurysm dissection flap identified. No significant atherosclerotic changes.  Steep angled take-off of the bilateral common iliac arteries.  Single bilateral renal arteries.  No celiac artery origin.  Superior mesenteric artery patent, with configuration of a celiacomesenteric trunk.  Late phase of the aortic rind demonstrates no evidence of bleeding.  Celiacomesenteric angiogram:  Normal caliber and contour of the origin of the trunk. Multiple jejunal branches present. Normal caliber and contour of the ileocolic artery. Multiple tortuous vessels overlie the proximal 3rd of the trunk, with evidence of a replaced common hepatic artery contributing to both right and left hepatic arteries. There is a replaced vessel representing a celiac vessel, with cross-filling at a bifurcation to the splenic artery and the hepatic arteries. Patent left gastric artery. New  Late phase of the trunk injection demonstrates no evidence of bleeding. No tumor blush or pooling of contrast. Patent portal venous system identified.  Common  hepatic artery injection:  Replaced common hepatic artery with right hepatic arteries and left hepatic arteries from the replaced trunk. No evidence of tumor blush, aneurysm, dissection flap, pulling of contrast. No abnormal enhancement identified. No pseudoaneurysm identified.  No call it artery injection:  Patent middle call it artery of normal caliber. Significant cross flow from collateral vessels with no pooling of contrast, pseudoaneurysm, or tumor blush.  Proximal jejunal arcade:  Normal course caliber and contour. Normal blush of the proximal jejunal mucosa. No filling defects, or pseudoaneurysm.  No embolization was performed.  IMPRESSION: Status post diagnostic mesenteric angiogram.  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  No bleeding was identified from the replaced common hepatic artery, which would be the best candidate for supplying the pancreaticoduodenal arteries near the known proximal duodenum ulcer. Because no bleeding was identified of this variant anatomy, no empiric embolization was performed.  Signed,  Dulcy Fanny. Earleen Newport, DO  [facility]  [facility]  PLAN: The patient will require 6 hours flattened bed with log roll only for hemostasis of the right common femoral artery after manual compression.  Agree with ICU management, including serial hemoglobin and hematocrit checks.   Electronically Signed   By: Corrie Mckusick D.O.   On: 05/05/2014 16:37   Ir Angiogram Selective Each Additional Vessel  05/05/2014   INDICATION: 34 year old female admitted to the hospital with upper GI bleeding and hematemesis.  She was admitted April 26, 2013. Upper endoscopy demonstrates duodenum ulcer.  She has been stable over 1 week, though developed syncopal episode and acute anemia with a hemoglobin of 5.7 on today's date. She has been referred for evaluation and treatment of the duodenum  ulcer hemorrhage.  EXAM: 1. AORTIC ANGIOGRAM 2. SUPERIOR MESENTERIC ARTERIOGRAM 3. SUB SELECTION OF FOUR TERTIARY BRANCHES FROM THE SUPERIOR MESENTERIC ARTERY 4. ULTRASOUND GUIDED VASCULAR ACCESS OF THE RIGHT COMMON FEMORAL ARTERY 5. MANUAL PRESSURE FOR HEMOSTASIS.  COMPARISON:  CT abdomen pelvis, 04/27/2013, 09/22/2001  MEDICATIONS: Versed 2.0 mg IV; Fentanyl 100 mcg IV  CONTRAST:  137m OMNIPAQUE IOHEXOL 300 MG/ML  SOLN  ANESTHESIA/SEDATION: One hundred fifty minutes  FLUOROSCOPY TIME:  Forty-one minutes.  Fifty-four seconds.  2941 mGy  ACCESS: Right common femoral artery; hemostasis achieved with manual compression.  COMPLICATIONS: None immediate  TECHNIQUE: Informed written consent was obtained from the patient after a discussion of the risks, benefits and alternatives to treatment. Questions regarding the procedure were encouraged and answered. A timeout was performed prior to the initiation of the procedure.  The right groin was prepped and draped in the usual sterile fashion, and a sterile drape was applied covering the operative field. Maximum barrier sterile technique with sterile gowns and gloves were used for the procedure. A timeout was performed prior to the initiation of the procedure. Local anesthesia was provided with 1% lidocaine.  Physical exam was performed to identify the location of the right common femoral artery. Ultrasound survey was performed with images stored sent to PACs.  Once the common femoral artery was identified under ultrasound, ultrasound guidance was used to generously infiltrate the skin and subcutaneous tissues with 1% lidocaine without epinephrine. A micropuncture access was then used to puncture the right common femoral artery. With excellent blood flow returned, and micro wire was advanced through the needle observed under fluoroscopy to enter the iliac system. The needle was removed from wire a micropuncture kit was advanced over the wire. The inner dilator and cannula were  removed with a wire, and an 035 Bentson wire was advanced into the abdominal aorta under fluoroscopy the micro puncture was removed, and a 5 French sheath was advanced over the Bentson wire into the common femoral artery. The dilator was removed and the sheath was flushed.  A pigtail Flush catheter was advanced over the Bentson wire into the lower thoracic aorta. Aortic flush angiogram performed.  Pigtail catheter was then exchanged over wire for C2 Cobra catheter which was used to select the superior mesenteric artery. Superior mesenteric artery angiogram was performed.  Subsequently, a combination of the micro catheter a micro wire were used to select various branches of superior mesenteric artery in this patient with a celiacomesenteric trunk. A standard renegade micro catheter as well as a soft synchro an a standard .016 synchro wire were used to select branches of the superior mesenteric artery. Sub selection included:  Replaced common hepatic artery.  Right hepatic artery.  New middle colicky artery.  Proximal jejunal branches.  Two base catheter exchanges were made on the Bentson wire including exchange for Mickelson catheter and exchanged for a C2 Cobra catheter for formation of a Waltman loop.  After dedicated angiogram of each of the sub selected arteries, all catheters and wires were removed.  Angiogram through the sheath was performed with the common femoral artery.  Deployment of an Exoseal device for hemostasis failed without deployment. Manual pressure was used for hemostasis of the right common femoral artery.  Patient tolerated procedure well and remained hemodynamically stable throughout. At the end of the procedure, the patient's vital signs were within normal range. Systolic brought pressure 110. Heart rate of 80-90. No syncopal events, no altered mental status, no hematemesis, and no hematochezia/melena.  Marland Kitchen  FINDINGS: Aortic angiogram:  Normal course caliber and contour of the abdominal aorta.  Multiple segmental vessels fill within the lower thoracic spine and lumbar spine. No aneurysm dissection flap identified. No significant atherosclerotic changes.  Steep angled take-off of the bilateral common iliac arteries.  Single bilateral renal arteries.  No celiac artery origin.  Superior mesenteric artery patent, with configuration of a celiacomesenteric trunk.  Late  phase of the aortic rind demonstrates no evidence of bleeding.  Celiacomesenteric angiogram:  Normal caliber and contour of the origin of the trunk. Multiple jejunal branches present. Normal caliber and contour of the ileocolic artery. Multiple tortuous vessels overlie the proximal 3rd of the trunk, with evidence of a replaced common hepatic artery contributing to both right and left hepatic arteries. There is a replaced vessel representing a celiac vessel, with cross-filling at a bifurcation to the splenic artery and the hepatic arteries. Patent left gastric artery. New  Late phase of the trunk injection demonstrates no evidence of bleeding. No tumor blush or pooling of contrast. Patent portal venous system identified.  Common hepatic artery injection:  Replaced common hepatic artery with right hepatic arteries and left hepatic arteries from the replaced trunk. No evidence of tumor blush, aneurysm, dissection flap, pulling of contrast. No abnormal enhancement identified. No pseudoaneurysm identified.  No call it artery injection:  Patent middle call it artery of normal caliber. Significant cross flow from collateral vessels with no pooling of contrast, pseudoaneurysm, or tumor blush.  Proximal jejunal arcade:  Normal course caliber and contour. Normal blush of the proximal jejunal mucosa. No filling defects, or pseudoaneurysm.  No embolization was performed.  IMPRESSION: Status post diagnostic mesenteric angiogram.  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  No bleeding was  identified from the replaced common hepatic artery, which would be the best candidate for supplying the pancreaticoduodenal arteries near the known proximal duodenum ulcer. Because no bleeding was identified of this variant anatomy, no empiric embolization was performed.  Signed,  Dulcy Fanny. Earleen Newport, DO  [facility]  [facility]  PLAN: The patient will require 6 hours flattened bed with log roll only for hemostasis of the right common femoral artery after manual compression.  Agree with ICU management, including serial hemoglobin and hematocrit checks.   Electronically Signed   By: Corrie Mckusick D.O.   On: 05/05/2014 16:37   Ir Angiogram Selective Each Additional Vessel  05/05/2014   INDICATION: 34 year old female admitted to the hospital with upper GI bleeding and hematemesis.  She was admitted April 26, 2013. Upper endoscopy demonstrates duodenum ulcer.  She has been stable over 1 week, though developed syncopal episode and acute anemia with a hemoglobin of 5.7 on today's date. She has been referred for evaluation and treatment of the duodenum ulcer hemorrhage.  EXAM: 1. AORTIC ANGIOGRAM 2. SUPERIOR MESENTERIC ARTERIOGRAM 3. SUB SELECTION OF FOUR TERTIARY BRANCHES FROM THE SUPERIOR MESENTERIC ARTERY 4. ULTRASOUND GUIDED VASCULAR ACCESS OF THE RIGHT COMMON FEMORAL ARTERY 5. MANUAL PRESSURE FOR HEMOSTASIS.  COMPARISON:  CT abdomen pelvis, 04/27/2013, 09/22/2001  MEDICATIONS: Versed 2.0 mg IV; Fentanyl 100 mcg IV  CONTRAST:  166m OMNIPAQUE IOHEXOL 300 MG/ML  SOLN  ANESTHESIA/SEDATION: One hundred fifty minutes  FLUOROSCOPY TIME:  Forty-one minutes.  Fifty-four seconds.  2941 mGy  ACCESS: Right common femoral artery; hemostasis achieved with manual compression.  COMPLICATIONS: None immediate  TECHNIQUE: Informed written consent was obtained from the patient after a discussion of the risks, benefits and alternatives to treatment. Questions regarding the procedure were  encouraged and answered. A timeout was performed prior to the initiation of the procedure.  The right groin was prepped and draped in the usual sterile fashion, and a sterile drape was applied covering the operative field. Maximum barrier sterile technique with sterile gowns and gloves were used for the procedure. A timeout was performed prior to the initiation  of the procedure. Local anesthesia was provided with 1% lidocaine.  Physical exam was performed to identify the location of the right common femoral artery. Ultrasound survey was performed with images stored sent to PACs.  Once the common femoral artery was identified under ultrasound, ultrasound guidance was used to generously infiltrate the skin and subcutaneous tissues with 1% lidocaine without epinephrine. A micropuncture access was then used to puncture the right common femoral artery. With excellent blood flow returned, and micro wire was advanced through the needle observed under fluoroscopy to enter the iliac system. The needle was removed from wire a micropuncture kit was advanced over the wire. The inner dilator and cannula were removed with a wire, and an 035 Bentson wire was advanced into the abdominal aorta under fluoroscopy the micro puncture was removed, and a 5 French sheath was advanced over the Bentson wire into the common femoral artery. The dilator was removed and the sheath was flushed.  A pigtail Flush catheter was advanced over the Bentson wire into the lower thoracic aorta. Aortic flush angiogram performed.  Pigtail catheter was then exchanged over wire for C2 Cobra catheter which was used to select the superior mesenteric artery. Superior mesenteric artery angiogram was performed.  Subsequently, a combination of the micro catheter a micro wire were used to select various branches of superior mesenteric artery in this patient with a celiacomesenteric trunk. A standard renegade micro catheter as well as a soft synchro an a standard .016  synchro wire were used to select branches of the superior mesenteric artery. Sub selection included:  Replaced common hepatic artery.  Right hepatic artery.  New middle colicky artery.  Proximal jejunal branches.  Two base catheter exchanges were made on the Bentson wire including exchange for Mickelson catheter and exchanged for a C2 Cobra catheter for formation of a Waltman loop.  After dedicated angiogram of each of the sub selected arteries, all catheters and wires were removed.  Angiogram through the sheath was performed with the common femoral artery.  Deployment of an Exoseal device for hemostasis failed without deployment. Manual pressure was used for hemostasis of the right common femoral artery.  Patient tolerated procedure well and remained hemodynamically stable throughout. At the end of the procedure, the patient's vital signs were within normal range. Systolic brought pressure 110. Heart rate of 80-90. No syncopal events, no altered mental status, no hematemesis, and no hematochezia/melena.  Marland Kitchen  FINDINGS: Aortic angiogram:  Normal course caliber and contour of the abdominal aorta. Multiple segmental vessels fill within the lower thoracic spine and lumbar spine. No aneurysm dissection flap identified. No significant atherosclerotic changes.  Steep angled take-off of the bilateral common iliac arteries.  Single bilateral renal arteries.  No celiac artery origin.  Superior mesenteric artery patent, with configuration of a celiacomesenteric trunk.  Late phase of the aortic rind demonstrates no evidence of bleeding.  Celiacomesenteric angiogram:  Normal caliber and contour of the origin of the trunk. Multiple jejunal branches present. Normal caliber and contour of the ileocolic artery. Multiple tortuous vessels overlie the proximal 3rd of the trunk, with evidence of a replaced common hepatic artery contributing to both right and left hepatic arteries. There is a replaced vessel representing a celiac vessel,  with cross-filling at a bifurcation to the splenic artery and the hepatic arteries. Patent left gastric artery. New  Late phase of the trunk injection demonstrates no evidence of bleeding. No tumor blush or pooling of contrast. Patent portal venous system identified.  Common hepatic artery  injection:  Replaced common hepatic artery with right hepatic arteries and left hepatic arteries from the replaced trunk. No evidence of tumor blush, aneurysm, dissection flap, pulling of contrast. No abnormal enhancement identified. No pseudoaneurysm identified.  No call it artery injection:  Patent middle call it artery of normal caliber. Significant cross flow from collateral vessels with no pooling of contrast, pseudoaneurysm, or tumor blush.  Proximal jejunal arcade:  Normal course caliber and contour. Normal blush of the proximal jejunal mucosa. No filling defects, or pseudoaneurysm.  No embolization was performed.  IMPRESSION: Status post diagnostic mesenteric angiogram.  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  No bleeding was identified from the replaced common hepatic artery, which would be the best candidate for supplying the pancreaticoduodenal arteries near the known proximal duodenum ulcer. Because no bleeding was identified of this variant anatomy, no empiric embolization was performed.  Signed,  Dulcy Fanny. Earleen Newport, DO  [facility]  [facility]  PLAN: The patient will require 6 hours flattened bed with log roll only for hemostasis of the right common femoral artery after manual compression.  Agree with ICU management, including serial hemoglobin and hematocrit checks.   Electronically Signed   By: Corrie Mckusick D.O.   On: 05/05/2014 16:37   Ir US Guide Vasc Access Right  05/05/2014   INDICATION: 34 year old female admitted to the hospital with upper GI bleeding and hematemesis.  She was admitted  April 26, 2013. Upper endoscopy demonstrates duodenum ulcer.  She has been stable over 1 week, though developed syncopal episode and acute anemia with a hemoglobin of 5.7 on today's date. She has been referred for evaluation and treatment of the duodenum ulcer hemorrhage.  EXAM: 1. AORTIC ANGIOGRAM 2. SUPERIOR MESENTERIC ARTERIOGRAM 3. SUB SELECTION OF FOUR TERTIARY BRANCHES FROM THE SUPERIOR MESENTERIC ARTERY 4. ULTRASOUND GUIDED VASCULAR ACCESS OF THE RIGHT COMMON FEMORAL ARTERY 5. MANUAL PRESSURE FOR HEMOSTASIS.  COMPARISON:  CT abdomen pelvis, 04/27/2013, 09/22/2001  MEDICATIONS: Versed 2.0 mg IV; Fentanyl 100 mcg IV  CONTRAST:  163m OMNIPAQUE IOHEXOL 300 MG/ML  SOLN  ANESTHESIA/SEDATION: One hundred fifty minutes  FLUOROSCOPY TIME:  Forty-one minutes.  Fifty-four seconds.  2941 mGy  ACCESS: Right common femoral artery; hemostasis achieved with manual compression.  COMPLICATIONS: None immediate  TECHNIQUE: Informed written consent was obtained from the patient after a discussion of the risks, benefits and alternatives to treatment. Questions regarding the procedure were encouraged and answered. A timeout was performed prior to the initiation of the procedure.  The right groin was prepped and draped in the usual sterile fashion, and a sterile drape was applied covering the operative field. Maximum barrier sterile technique with sterile gowns and gloves were used for the procedure. A timeout was performed prior to the initiation of the procedure. Local anesthesia was provided with 1% lidocaine.  Physical exam was performed to identify the location of the right common femoral artery. Ultrasound survey was performed with images stored sent to PACs.  Once the common femoral artery was identified under ultrasound, ultrasound guidance was used to generously infiltrate the skin and subcutaneous tissues with 1% lidocaine without epinephrine. A micropuncture access was then used to puncture the right common femoral  artery. With excellent blood flow returned, and micro wire was advanced through the needle observed under fluoroscopy to enter the iliac system. The needle was removed from wire a micropuncture kit was advanced over the wire. The inner dilator and cannula were removed  with a wire, and an 035 Bentson wire was advanced into the abdominal aorta under fluoroscopy the micro puncture was removed, and a 5 French sheath was advanced over the Bentson wire into the common femoral artery. The dilator was removed and the sheath was flushed.  A pigtail Flush catheter was advanced over the Bentson wire into the lower thoracic aorta. Aortic flush angiogram performed.  Pigtail catheter was then exchanged over wire for C2 Cobra catheter which was used to select the superior mesenteric artery. Superior mesenteric artery angiogram was performed.  Subsequently, a combination of the micro catheter a micro wire were used to select various branches of superior mesenteric artery in this patient with a celiacomesenteric trunk. A standard renegade micro catheter as well as a soft synchro an a standard .016 synchro wire were used to select branches of the superior mesenteric artery. Sub selection included:  Replaced common hepatic artery.  Right hepatic artery.  New middle colicky artery.  Proximal jejunal branches.  Two base catheter exchanges were made on the Bentson wire including exchange for Mickelson catheter and exchanged for a C2 Cobra catheter for formation of a Waltman loop.  After dedicated angiogram of each of the sub selected arteries, all catheters and wires were removed.  Angiogram through the sheath was performed with the common femoral artery.  Deployment of an Exoseal device for hemostasis failed without deployment. Manual pressure was used for hemostasis of the right common femoral artery.  Patient tolerated procedure well and remained hemodynamically stable throughout. At the end of the procedure, the patient's vital signs  were within normal range. Systolic brought pressure 110. Heart rate of 80-90. No syncopal events, no altered mental status, no hematemesis, and no hematochezia/melena.  Marland Kitchen  FINDINGS: Aortic angiogram:  Normal course caliber and contour of the abdominal aorta. Multiple segmental vessels fill within the lower thoracic spine and lumbar spine. No aneurysm dissection flap identified. No significant atherosclerotic changes.  Steep angled take-off of the bilateral common iliac arteries.  Single bilateral renal arteries.  No celiac artery origin.  Superior mesenteric artery patent, with configuration of a celiacomesenteric trunk.  Late phase of the aortic rind demonstrates no evidence of bleeding.  Celiacomesenteric angiogram:  Normal caliber and contour of the origin of the trunk. Multiple jejunal branches present. Normal caliber and contour of the ileocolic artery. Multiple tortuous vessels overlie the proximal 3rd of the trunk, with evidence of a replaced common hepatic artery contributing to both right and left hepatic arteries. There is a replaced vessel representing a celiac vessel, with cross-filling at a bifurcation to the splenic artery and the hepatic arteries. Patent left gastric artery. New  Late phase of the trunk injection demonstrates no evidence of bleeding. No tumor blush or pooling of contrast. Patent portal venous system identified.  Common hepatic artery injection:  Replaced common hepatic artery with right hepatic arteries and left hepatic arteries from the replaced trunk. No evidence of tumor blush, aneurysm, dissection flap, pulling of contrast. No abnormal enhancement identified. No pseudoaneurysm identified.  No call it artery injection:  Patent middle call it artery of normal caliber. Significant cross flow from collateral vessels with no pooling of contrast, pseudoaneurysm, or tumor blush.  Proximal jejunal arcade:  Normal course caliber and contour. Normal blush of the proximal jejunal mucosa. No  filling defects, or pseudoaneurysm.  No embolization was performed.  IMPRESSION: Status post diagnostic mesenteric angiogram.  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  No  bleeding was identified from the replaced common hepatic artery, which would be the best candidate for supplying the pancreaticoduodenal arteries near the known proximal duodenum ulcer. Because no bleeding was identified of this variant anatomy, no empiric embolization was performed.  Signed,  Dulcy Fanny. Earleen Newport, DO  [facility]  [facility]  PLAN: The patient will require 6 hours flattened bed with log roll only for hemostasis of the right common femoral artery after manual compression.  Agree with ICU management, including serial hemoglobin and hematocrit checks.   Electronically Signed   By: Corrie Mckusick D.O.   On: 05/05/2014 16:37     CBC  Recent Labs Lab 05/05/14 1600 05/06/14 0400 05/07/14 0500  05/08/14 0023 05/08/14 0840 05/08/14 1445 05/08/14 2120 05/09/14 0345  WBC 7.8 9.9 9.9  --  23.5*  --   --   --  14.7*  HGB 9.9* 9.0* 7.8*  < > 11.7* 10.0* 10.1* 9.9* 9.5*  HCT 29.5* 26.0* 23.1*  < > 34.2* 28.5* 29.2* 28.8* 28.2*  PLT 190 181 217  --  149*  --   --   --  211  MCV 87.0 84.7 86.2  --  84.9  --   --   --  87.6  MCH 29.2 29.3 29.1  --  29.0  --   --   --  29.5  MCHC 33.6 34.6 33.8  --  34.2  --   --   --  33.7  RDW 14.9 15.1 15.3  --  14.7  --   --   --  16.1*  < > = values in this interval not displayed.  Chemistries   Recent Labs Lab 05/02/14 1442  05/04/14 0518 05/05/14 1600 05/05/14 1800 05/06/14 1010 05/07/14 0500 05/08/14 0023 05/09/14 0310  NA  --   < > 138 138  --  135 137 136 135  K 3.7  --  3.7 6.6* 3.6 3.6 3.7 4.0 3.5  CL  --   < > 107 108  --  108 109 112 104  CO2  --   < > 27 24  --  _0 GLUCOSE  --   < > 93 136*  --  85 102* 152* 135*  BUN  --   < > <5* 12   --  _1 <5*  CREATININE  --   < > 0.58 0.58  --  0.48* 0.53 0.49* 0.61  CALCIUM  --   < > 8.1* 6.9*  --  7.5* 7.7* 7.3* 7.8*  MG 1.6  --   --   --  1.8  --  2.0  --  1.8  AST  --   --  13  --   --   --   --   --   --   ALT  --   --  11  --   --   --   --   --   --   ALKPHOS  --   --  59  --   --   --   --   --   --   BILITOT  --   --  0.5  --   --   --   --   --   --   < > = values in this interval not displayed. ------------------------------------------------------------------------------------------------------------------ estimated creatinine clearance is 97.9 mL/min (by C-G formula based on Cr of 0.61). ------------------------------------------------------------------------------------------------------------------ No results for input(s):  HGBA1C in the last 72 hours. ------------------------------------------------------------------------------------------------------------------ No results for input(s): CHOL, HDL, LDLCALC, TRIG, CHOLHDL, LDLDIRECT in the last 72 hours. ------------------------------------------------------------------------------------------------------------------ No results for input(s): TSH, T4TOTAL, T3FREE, THYROIDAB in the last 72 hours.  Invalid input(s): FREET3 ------------------------------------------------------------------------------------------------------------------ No results for input(s): VITAMINB12, FOLATE, FERRITIN, TIBC, IRON, RETICCTPCT in the last 72 hours.  Coagulation profile  Recent Labs Lab 05/04/14 0518 05/07/14 2020  INR 1.04 1.18    No results for input(s): DDIMER in the last 72 hours.  Cardiac Enzymes No results for input(s): CKMB, TROPONINI, MYOGLOBIN in the last 168 hours.  Invalid input(s): CK ------------------------------------------------------------------------------------------------------------------ Invalid input(s): POCBNP     Time Spent in minutes   45   SINGH,PRASHANT K M.D on 05/09/2014 at 10:36  AM  Between 7am to 7pm - Pager - [phone]  After 7pm go to [email or web address] - password [facility]  [facility]   Office  [phone]

## 2014-05-10 LAB — URINE MICROSCOPIC-ADD ON

## 2014-05-10 LAB — CBC
HCT: 24.4 % — ABNORMAL LOW (ref 36.0–46.0)
Hemoglobin: 8.1 g/dL — ABNORMAL LOW (ref 12.0–15.0)
MCH: 29.3 pg (ref 26.0–34.0)
MCHC: 33.2 g/dL (ref 30.0–36.0)
MCV: 88.4 fL (ref 78.0–100.0)
Platelets: 215 10*3/uL (ref 150–400)
RBC: 2.76 MIL/uL — ABNORMAL LOW (ref 3.87–5.11)
RDW: 16 % — ABNORMAL HIGH (ref 11.5–15.5)
WBC: 11.2 10*3/uL — ABNORMAL HIGH (ref 4.0–10.5)

## 2014-05-10 LAB — URINALYSIS, ROUTINE W REFLEX MICROSCOPIC
Bilirubin Urine: NEGATIVE
Glucose, UA: NEGATIVE mg/dL
Ketones, ur: NEGATIVE mg/dL
Nitrite: NEGATIVE
Protein, ur: NEGATIVE mg/dL
Specific Gravity, Urine: 1.012 (ref 1.005–1.030)
Urobilinogen, UA: 1 mg/dL (ref 0.0–1.0)
pH: 6 (ref 5.0–8.0)

## 2014-05-10 LAB — HEMOGLOBIN AND HEMATOCRIT, BLOOD
HCT: 24.7 % — ABNORMAL LOW (ref 36.0–46.0)
HCT: 24.3 % — ABNORMAL LOW (ref 36.0–46.0)
Hemoglobin: 8.2 g/dL — ABNORMAL LOW (ref 12.0–15.0)
Hemoglobin: 8 g/dL — ABNORMAL LOW (ref 12.0–15.0)

## 2014-05-10 LAB — BASIC METABOLIC PANEL
Anion gap: 7 (ref 5–15)
BUN: <5 mg/dL — ABNORMAL LOW (ref 6–23)
CO2: 28 mmol/L (ref 19–32)
Calcium: 7.9 mg/dL — ABNORMAL LOW (ref 8.4–10.5)
Chloride: 103 mmol/L (ref 96–112)
Creatinine, Ser: 0.63 mg/dL (ref 0.50–1.10)
GFR calc Af Amer: >90 mL/min (ref >90)
GFR calc non Af Amer: >90 mL/min (ref >90)
Glucose, Bld: 109 mg/dL — ABNORMAL HIGH (ref 70–99)
Potassium: 3.4 mmol/L — ABNORMAL LOW (ref 3.5–5.1)
Sodium: 138 mmol/L (ref 135–145)

## 2014-05-10 LAB — URINE CULTURE
Colony Count: NO GROWTH
Culture: NO GROWTH

## 2014-05-10 LAB — PHOSPHORUS: Phosphorus: 3.2 mg/dL (ref 2.3–4.6)

## 2014-05-10 LAB — MAGNESIUM: Magnesium: 1.9 mg/dL (ref 1.5–2.5)

## 2014-05-10 MED ORDER — SODIUM CHLORIDE 0.9 % IJ SOLN
10.0000 mL | INTRAMUSCULAR | Status: DC | PRN
  Administered 2014-05-10 – 2014-05-13 (×8): 10 mL
  Filled 2014-05-10 (×7): qty 40

## 2014-05-10 MED ORDER — POTASSIUM CHLORIDE 10 MEQ/100ML IV SOLN
10.0000 meq | INTRAVENOUS | Status: AC
  Administered 2014-05-10 (×2): 10 meq via INTRAVENOUS
  Filled 2014-05-10: qty 100

## 2014-05-10 NOTE — Progress Notes (Signed)
[facility] Progress Note  3 Days Post-Op  Subjective: Pt doing okay, still c/o pain, but most of her pain is in her back and from a headache.  She ambulated 2 laps yesterday and several times in the room.  IS to 1750.  Urinating well.  No flatus only belching.  No BM yet.  Patient says she's on her period.    Objective: Vital signs in last 24 hours: Temp:  [98.4 F (36.9 C)-98.5 F (36.9 C)] 98.4 F (36.9 C) (03/11 0454) Pulse Rate:  [115-123] 115 (03/11 0454) Resp:  [9-15] 14 (03/11 0741) BP: (92-102)/(49-60) 97/60 mmHg (03/11 0454) SpO2:  [90 %-99 %] 95 % (03/11 0741) Last BM Date: 05/09/14  Intake/Output from previous day: 03/10 0701 - 03/11 0700 In: 1100 [I.V.:1100] Out: 1190 [Urine:975; Emesis/NG output:200; Drains:15] Intake/Output this shift:    PE: Gen:  Alert, NAD, pleasant Card:  RRR, no M/G/R heard Pulm:  CTA, no W/R/R, SI to 1750 Abd: Soft, mildly distended, appropriately tender (not significantly tender to palpation) pain subjectively out of proportion to exam, +BS, no HSM, incisions C/D/I with staples in place, drain with serosanguinous drainage (15mL), NG with 26900mL/24 hr which is dark red and bilious   Lab Results:   Recent Labs  05/09/14 0345  05/09/14 1810 05/10/14 0430  WBC 14.7*  --   --  11.2*  HGB 9.5*  < > 9.4* 8.1*  HCT 28.2*  < > 27.6* 24.4*  PLT 211  --   --  215  < > = values in this interval not displayed. BMET  Recent Labs  05/09/14 0310 05/10/14 0430  NA 135 138  K 3.5 3.4*  CL 104 103  CO2 28 28  GLUCOSE 135* 109*  BUN <5* <5*  CREATININE 0.61 0.63  CALCIUM 7.8* 7.9*   PT/INR  Recent Labs  05/07/14 2020  LABPROT 15.2  INR 1.18   CMP     Component Value Date/Time   NA 138 05/10/2014 0430   K 3.4* 05/10/2014 0430   CL 103 05/10/2014 0430   CO2 28 05/10/2014 0430   GLUCOSE 109* 05/10/2014 0430   BUN <5* 05/10/2014 0430   CREATININE 0.63 05/10/2014 0430   CALCIUM 7.9* 05/10/2014 0430   PROT 4.6*  05/04/2014 0518   ALBUMIN 2.3* 05/04/2014 0518   AST 13 05/04/2014 0518   ALT 11 05/04/2014 0518   ALKPHOS 59 05/04/2014 0518   BILITOT 0.5 05/04/2014 0518   GFRNONAA >90 05/10/2014 0430   GFRAA >90 05/10/2014 0430   Lipase     Component Value Date/Time   LIPASE 28 04/26/2014 2314       Studies/Results: Dg Chest 2 View  05/09/2014   CLINICAL DATA:  Cough and postnasal drip for a few days.  Weakness.  EXAM: CHEST  2 VIEW  COMPARISON:  None.  FINDINGS: A right PICC is present and terminates near the cavoatrial junction. An enteric tube loops in the upper abdomen over the proximal stomach with side hole well beyond the GE junction and tip not imaged. There is slight elevation of the right hemidiaphragm. Cardiomediastinal silhouette is within normal limits. The lungs are slightly hypoinflated without evidence of airspace consolidation, edema, pleural effusion, or pneumothorax. Upper abdominal skin staples and a likely surgical drain are noted. No acute osseous abnormality is identified.  IMPRESSION: No active cardiopulmonary disease.   Electronically Signed   By: Sebastian AcheAllen  Grady   On: 05/09/2014 14:59    Anti-infectives: Anti-infectives  Start     Dose/Rate Route Frequency Ordered Stop   05/07/14 2100  [MAR Hold]  ceFAZolin (ANCEF) IVPB 2 g/50 mL premix  Status:  Discontinued     (MAR Hold since 05/07/14 1951)   2 g 100 mL/hr over 30 Minutes Intravenous  Once 05/07/14 1924 05/09/14 0720       Assessment/Plan Bleeding duodenal ulcer s/p POD #3 exploratory laparotomy, oversew of duodenal ulcer -Continue dilaudid PCA at full dose, robaxin and fentanyl patch-BP too low to give any more narcotics, she has had a h/o narcotic abuse as well which makes her very challenging to control pain -Add ice to incision, heat to back -Mobilize to chair/rocking chair and in halls, IS, SCD's -Continue NGT to LWIS until bowel function returns, output 200 dark old blood and bile -Remove midline dressing  and leave open to air -continue IVF -On floor -Await bowel function, but may need UGI prior to removal of NG and start of PO intake -Staples to be removed on 05/17/14 -D/c foley -NO PHARM DVT PROPH for now, SCD's Leukocytosis - 11.2 -Improving ABL anemia -From blood loss before, during surgery and now she is on her period -Hgb down to 8.1 today, platelets improving -Consider backing off number of blood draws since we believe bleeding is controlled other than her menstrual cycle Chronic tobacco use Hypothyroidism Anxiety 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

## 2014-05-10 NOTE — Progress Notes (Signed)
Patient Demographics  Kimberly Weiss, is a 34 y.o. female, DOB - 1980/05/25, VXB:[id number]  Admit date - 04/26/2014   Admitting Physician Colbert Coyer, MD  Outpatient Primary MD for the patient is LUKING,SCOTT, MD  LOS - 63   Chief Complaint  Patient presents with  . Hypotension      Summary  34 y/o woman admitted 2/26 after syncopal episode. Work up concerning for UGI bleed likely due to NSAID usage. PCCM consulted for ICU admission 2/26 . Found to have large UGIB d/t duodenal ulcer on EGD .Initially required 5 u PRBC , tx w/ PPI stable to [place] on 3/1 . Reconsulted 3/6 for Hypotension/GI bleed. Pt transferred to ICU . IR eval for possible embolization which failed, she was subsequently taken to or and underwent exploratory lap with suturing of bleeding duodenal ulcer. She was stabilized in ICU and transferred under my care on 05/09/2014.   Subjective:   Kimberly Weiss today has, No headache, No chest pain,  No new weakness tingling or numbness, No Cough - SOB.  Sleeping in bed, on Dilaudid PCA, somnolent, aroused, complains of 10 out of 10 pain and then goes back to sleep.  Assessment & Plan    1. Severe upper GI bleed with blood loss treated anemia secondary to duodenal ulcer. Seen by GI, general surgery and critical care. Status post exploratory laparotomy with [place] of duodenal ulcer by general surgery on 05/07/2014. Has received over 5 units of RBCs this admission, continue IV PPI, continue nothing by mouth. Diet advanced per general surgery. Will continue to monitor H&H, there is some fall secondary to patient experiencing heavy menstrual periods are tearing 05/09/2014. We'll transfuse if hemoglobin drops below 7.5.    2. Hypovolemic hemorrhagic shock and hypotension.  Currently resolved. Monitor H&H gentle IV fluids as nothing by mouth.    3. Chronic tobacco abuse, alcohol use. Counseled to quit both.    4. Hypothyroidism on IV Synthroid.    5. Chr back pain. Has tremendous narcotic tolerance. Does show some narcotic seeking behavior. On Dilaudid PCA, somnolent and sleepy, wakes up and asks for more IV narcotics, politely refused as it's becoming dangerous.    6. Mild leukocytosis. Likely reactionary. Stable 2 view chest x-ray, UA pending.    Patient encouraged to increase activity, encouraged to sit up in the bed, ambulating in the hallway and work with PT. Will provide IS     Code Status: Full  Family Communication: none present  Disposition Plan: Home   Procedures   2/26 Admit with syncope prior to presentation, concern for UGIB. Hgb 8.2 from 15 2/28 No further bleeding, pt seeking pain meds, getting out of bed without assistance 2/28 Massive cratered duodenal bulb ulcer noted on EGD 3/6 Near syncopal episode w/ black stool , sharp drop in hbg and hypotension  3/7 Hg stable, transfer out of the ICU to tele 3/8 Hg unstable, transfer out of tele to the ICU 05-07-14 - Exploratory laparotomy, oversew of duodenal ulcer   Consults  PCCM, GI, CCS   Medications  Scheduled Meds: . antiseptic oral rinse  7 mL Mouth Rinse BID  . busPIRone  7.5 mg Oral BID  . fentaNYL  50 mcg Transdermal Q72H  . fluticasone  2 spray Each Nare Daily  . HYDROmorphone PCA 0.3 mg/mL   Intravenous 6 times per day  . levothyroxine  25 mcg Intravenous Daily  . nicotine  14 mg Transdermal Daily  . [START ON 05/12/2014] pantoprazole (PROTONIX) IV  40 mg Intravenous Q12H  . potassium chloride  10 mEq Intravenous Q1 Hr x 2  . sodium chloride  1 spray Each Nare BID  . sucralfate  1 g Oral TID WC & HS   Continuous Infusions: . dextrose 5 % and 0.45% NaCl 1,000 mL with potassium chloride 20 mEq infusion 100 mL/hr at 05/09/14 1641  . pantoprozole  (PROTONIX) infusion 8 mg/hr (05/09/14 1834)   PRN Meds:.acetaminophen, albuterol, diphenhydrAMINE **OR** diphenhydrAMINE, HYDROmorphone (DILAUDID) injection, LORazepam, methocarbamol (ROBAXIN)  IV, naloxone **AND** sodium chloride, ondansetron (ZOFRAN) IV, phenol, promethazine, sodium chloride  DVT Prophylaxis   SCDs    Lab Results  Component Value Date   PLT 215 05/10/2014    Antibiotics     Anti-infectives    Start     Dose/Rate Route Frequency Ordered Stop   05/07/14 2100  [MAR Hold]  ceFAZolin (ANCEF) IVPB 2 g/50 mL premix  Status:  Discontinued     (MAR Hold since 05/07/14 1951)   2 g 100 mL/hr over 30 Minutes Intravenous  Once 05/07/14 1924 05/09/14 0720          Objective:   Filed Vitals:   05/10/14 0105 05/10/14 0454 05/10/14 0741 05/10/14 0909  BP: 92/55 97/60  101/61  Pulse: 123 115  118  Temp: 98.5 F (36.9 C) 98.4 F (36.9 C)  98.9 F (37.2 C)  TempSrc: Oral Oral  Oral  Resp: _0 Height:      Weight:      SpO2: 95% 92% 95% 96%    Wt Readings from Last 3 Encounters:  05/08/14 78 kg (171 lb 15.3 oz)  02/04/14 68.493 kg (151 lb)  01/04/14 68.493 kg (151 lb)     Intake/Output Summary (Last 24 hours) at 05/10/14 1015 Last data filed at 05/10/14 0933  Gross per 24 hour  Intake   1220 ml  Output   1190 ml  Net     30 ml     Physical Exam  Somnolent, easily arousable, answers questions and follows commands, No new F.N deficits, Normal affect [place].AT,PERRAL Supple Neck,No JVD, No cervical lymphadenopathy appriciated.  Symmetrical Chest wall movement, Good air movement bilaterally, CTAB RRR,No Gallops,Rubs or new Murmurs, No Parasternal Heave +ve B.Sounds, Abd Soft, No tenderness, No organomegaly appriciated, No rebound - guarding or rigidity.  Abdominal incision site under bandage with JP drain in place.  Marland Kitchen No Cyanosis, Clubbing or edema, No new Rash or bruise      Data Review   Micro Results No results found for this or any previous  visit (from the past 240 hour(s)).  Radiology Reports Dg Chest 2 View  05/09/2014   CLINICAL DATA:  Cough and postnasal drip for a few days.  Weakness.  EXAM: CHEST  2 VIEW  COMPARISON:  None.  FINDINGS: A right PICC is present and terminates near the cavoatrial junction. An enteric tube loops in the upper abdomen over the proximal stomach with side hole well beyond the GE junction and tip not imaged. There is slight elevation of the right hemidiaphragm. Cardiomediastinal silhouette is within normal limits. The lungs are slightly hypoinflated without evidence of airspace consolidation, edema, pleural effusion, or pneumothorax. Upper abdominal skin staples and a likely surgical drain  are noted. No acute osseous abnormality is identified.  IMPRESSION: No active cardiopulmonary disease.   Electronically Signed   By: Logan Bores   On: 05/09/2014 14:59   Ct Head Wo Contrast  04/29/2014   CLINICAL DATA:  Fall. Hit left-sided head on dry air. Headaches. Neck pain.  EXAM: CT HEAD WITHOUT CONTRAST  CT CERVICAL SPINE WITHOUT CONTRAST  TECHNIQUE: Multidetector CT imaging of the head and cervical spine was performed following the standard protocol without intravenous contrast. Multiplanar CT image reconstructions of the cervical spine were also generated.  COMPARISON:  None.  FINDINGS: CT HEAD FINDINGS  No acute cortical infarct, hemorrhage, or mass lesion ispresent. Ventricles are of normal size. No significant extra-axial fluid collection is present. The paranasal sinuses andmastoid air cells are clear. The osseous skull is intact.  CT CERVICAL SPINE FINDINGS  There is reversal of normal cervical lordosis which may reflect muscle spasm or patient positioning. The vertebral body heights are well preserved. The facet joints are all well aligned. The prevertebral soft tissue space is normal.  IMPRESSION: 1. No acute intracranial abnormalities. 2. Reversal of normal cervical lordosis which may reflect muscle spasm or  patient positioning.   Electronically Signed   By: Kerby Moors M.D.   On: 04/29/2014 16:00   Ct Cervical Spine Wo Contrast  04/29/2014   CLINICAL DATA:  Fall. Hit left-sided head on dry air. Headaches. Neck pain.  EXAM: CT HEAD WITHOUT CONTRAST  CT CERVICAL SPINE WITHOUT CONTRAST  TECHNIQUE: Multidetector CT imaging of the head and cervical spine was performed following the standard protocol without intravenous contrast. Multiplanar CT image reconstructions of the cervical spine were also generated.  COMPARISON:  None.  FINDINGS: CT HEAD FINDINGS  No acute cortical infarct, hemorrhage, or mass lesion ispresent. Ventricles are of normal size. No significant extra-axial fluid collection is present. The paranasal sinuses andmastoid air cells are clear. The osseous skull is intact.  CT CERVICAL SPINE FINDINGS  There is reversal of normal cervical lordosis which may reflect muscle spasm or patient positioning. The vertebral body heights are well preserved. The facet joints are all well aligned. The prevertebral soft tissue space is normal.  IMPRESSION: 1. No acute intracranial abnormalities. 2. Reversal of normal cervical lordosis which may reflect muscle spasm or patient positioning.   Electronically Signed   By: Kerby Moors M.D.   On: 04/29/2014 16:00   Ir Angiogram Visceral Selective  05/05/2014   INDICATION: 34 year old female admitted to the hospital with upper GI bleeding and hematemesis.  She was admitted April 26, 2013. Upper endoscopy demonstrates duodenum ulcer.  She has been stable over 1 week, though developed syncopal episode and acute anemia with a hemoglobin of 5.7 on today's date. She has been referred for evaluation and treatment of the duodenum ulcer hemorrhage.  EXAM: 1. AORTIC ANGIOGRAM 2. SUPERIOR MESENTERIC ARTERIOGRAM 3. SUB SELECTION OF FOUR TERTIARY BRANCHES FROM THE SUPERIOR MESENTERIC ARTERY 4. ULTRASOUND GUIDED VASCULAR ACCESS OF THE RIGHT COMMON FEMORAL ARTERY 5. MANUAL PRESSURE  FOR HEMOSTASIS.  COMPARISON:  CT abdomen pelvis, 04/27/2013, 09/22/2001  MEDICATIONS: Versed 2.0 mg IV; Fentanyl 100 mcg IV  CONTRAST:  191m OMNIPAQUE IOHEXOL 300 MG/ML  SOLN  ANESTHESIA/SEDATION: One hundred fifty minutes  FLUOROSCOPY TIME:  Forty-one minutes.  Fifty-four seconds.  2941 mGy  ACCESS: Right common femoral artery; hemostasis achieved with manual compression.  COMPLICATIONS: None immediate  TECHNIQUE: Informed written consent was obtained from the patient after a discussion of the risks, benefits and alternatives to treatment.  Questions regarding the procedure were encouraged and answered. A timeout was performed prior to the initiation of the procedure.  The right groin was prepped and draped in the usual sterile fashion, and a sterile drape was applied covering the operative field. Maximum barrier sterile technique with sterile gowns and gloves were used for the procedure. A timeout was performed prior to the initiation of the procedure. Local anesthesia was provided with 1% lidocaine.  Physical exam was performed to identify the location of the right common femoral artery. Ultrasound survey was performed with images stored sent to PACs.  Once the common femoral artery was identified under ultrasound, ultrasound guidance was used to generously infiltrate the skin and subcutaneous tissues with 1% lidocaine without epinephrine. A micropuncture access was then used to puncture the right common femoral artery. With excellent blood flow returned, and micro wire was advanced through the needle observed under fluoroscopy to enter the iliac system. The needle was removed from wire a micropuncture kit was advanced over the wire. The inner dilator and cannula were removed with a wire, and an 035 Bentson wire was advanced into the abdominal aorta under fluoroscopy the micro puncture was removed, and a 5 French sheath was advanced over the Bentson wire into the common femoral artery. The dilator was removed and  the sheath was flushed.  A pigtail Flush catheter was advanced over the Bentson wire into the lower thoracic aorta. Aortic flush angiogram performed.  Pigtail catheter was then exchanged over wire for C2 Cobra catheter which was used to select the superior mesenteric artery. Superior mesenteric artery angiogram was performed.  Subsequently, a combination of the micro catheter a micro wire were used to select various branches of superior mesenteric artery in this patient with a celiacomesenteric trunk. A standard renegade micro catheter as well as a soft synchro an a standard .016 synchro wire were used to select branches of the superior mesenteric artery. Sub selection included:  Replaced common hepatic artery.  Right hepatic artery.  New middle colicky artery.  Proximal jejunal branches.  Two base catheter exchanges were made on the Bentson wire including exchange for Mickelson catheter and exchanged for a C2 Cobra catheter for formation of a Waltman loop.  After dedicated angiogram of each of the sub selected arteries, all catheters and wires were removed.  Angiogram through the sheath was performed with the common femoral artery.  Deployment of an Exoseal device for hemostasis failed without deployment. Manual pressure was used for hemostasis of the right common femoral artery.  Patient tolerated procedure well and remained hemodynamically stable throughout. At the end of the procedure, the patient's vital signs were within normal range. Systolic brought pressure 110. Heart rate of 80-90. No syncopal events, no altered mental status, no hematemesis, and no hematochezia/melena.  Marland Kitchen  FINDINGS: Aortic angiogram:  Normal course caliber and contour of the abdominal aorta. Multiple segmental vessels fill within the lower thoracic spine and lumbar spine. No aneurysm dissection flap identified. No significant atherosclerotic changes.  Steep angled take-off of the bilateral common iliac arteries.  Single bilateral renal  arteries.  No celiac artery origin.  Superior mesenteric artery patent, with configuration of a celiacomesenteric trunk.  Late phase of the aortic rind demonstrates no evidence of bleeding.  Celiacomesenteric angiogram:  Normal caliber and contour of the origin of the trunk. Multiple jejunal branches present. Normal caliber and contour of the ileocolic artery. Multiple tortuous vessels overlie the proximal 3rd of the trunk, with evidence of a replaced common hepatic  artery contributing to both right and left hepatic arteries. There is a replaced vessel representing a celiac vessel, with cross-filling at a bifurcation to the splenic artery and the hepatic arteries. Patent left gastric artery. New  Late phase of the trunk injection demonstrates no evidence of bleeding. No tumor blush or pooling of contrast. Patent portal venous system identified.  Common hepatic artery injection:  Replaced common hepatic artery with right hepatic arteries and left hepatic arteries from the replaced trunk. No evidence of tumor blush, aneurysm, dissection flap, pulling of contrast. No abnormal enhancement identified. No pseudoaneurysm identified.  No call it artery injection:  Patent middle call it artery of normal caliber. Significant cross flow from collateral vessels with no pooling of contrast, pseudoaneurysm, or tumor blush.  Proximal jejunal arcade:  Normal course caliber and contour. Normal blush of the proximal jejunal mucosa. No filling defects, or pseudoaneurysm.  No embolization was performed.  IMPRESSION: Status post diagnostic mesenteric angiogram.  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  No bleeding was identified from the replaced common hepatic artery, which would be the best candidate for supplying the pancreaticoduodenal arteries near the known proximal duodenum ulcer. Because no bleeding was identified of this variant anatomy, no empiric  embolization was performed.  Signed,  Dulcy Fanny. Earleen Newport, DO  [facility]  [facility]  PLAN: The patient will require 6 hours flattened bed with log roll only for hemostasis of the right common femoral artery after manual compression.  Agree with ICU management, including serial hemoglobin and hematocrit checks.   Electronically Signed   By: Corrie Mckusick D.O.   On: 05/05/2014 16:37   Ir Angiogram Selective Each Additional Vessel  05/05/2014   INDICATION: 34 year old female admitted to the hospital with upper GI bleeding and hematemesis.  She was admitted April 26, 2013. Upper endoscopy demonstrates duodenum ulcer.  She has been stable over 1 week, though developed syncopal episode and acute anemia with a hemoglobin of 5.7 on today's date. She has been referred for evaluation and treatment of the duodenum ulcer hemorrhage.  EXAM: 1. AORTIC ANGIOGRAM 2. SUPERIOR MESENTERIC ARTERIOGRAM 3. SUB SELECTION OF FOUR TERTIARY BRANCHES FROM THE SUPERIOR MESENTERIC ARTERY 4. ULTRASOUND GUIDED VASCULAR ACCESS OF THE RIGHT COMMON FEMORAL ARTERY 5. MANUAL PRESSURE FOR HEMOSTASIS.  COMPARISON:  CT abdomen pelvis, 04/27/2013, 09/22/2001  MEDICATIONS: Versed 2.0 mg IV; Fentanyl 100 mcg IV  CONTRAST:  161m OMNIPAQUE IOHEXOL 300 MG/ML  SOLN  ANESTHESIA/SEDATION: One hundred fifty minutes  FLUOROSCOPY TIME:  Forty-one minutes.  Fifty-four seconds.  2941 mGy  ACCESS: Right common femoral artery; hemostasis achieved with manual compression.  COMPLICATIONS: None immediate  TECHNIQUE: Informed written consent was obtained from the patient after a discussion of the risks, benefits and alternatives to treatment. Questions regarding the procedure were encouraged and answered. A timeout was performed prior to the initiation of the procedure.  The right groin was prepped and draped in the usual sterile fashion, and a sterile drape was applied covering the operative field. Maximum barrier  sterile technique with sterile gowns and gloves were used for the procedure. A timeout was performed prior to the initiation of the procedure. Local anesthesia was provided with 1% lidocaine.  Physical exam was performed to identify the location of the right common femoral artery. Ultrasound survey was performed with images stored sent to PACs.  Once the common femoral artery was identified under ultrasound, ultrasound guidance was used to generously infiltrate the  skin and subcutaneous tissues with 1% lidocaine without epinephrine. A micropuncture access was then used to puncture the right common femoral artery. With excellent blood flow returned, and micro wire was advanced through the needle observed under fluoroscopy to enter the iliac system. The needle was removed from wire a micropuncture kit was advanced over the wire. The inner dilator and cannula were removed with a wire, and an 035 Bentson wire was advanced into the abdominal aorta under fluoroscopy the micro puncture was removed, and a 5 French sheath was advanced over the Bentson wire into the common femoral artery. The dilator was removed and the sheath was flushed.  A pigtail Flush catheter was advanced over the Bentson wire into the lower thoracic aorta. Aortic flush angiogram performed.  Pigtail catheter was then exchanged over wire for C2 Cobra catheter which was used to select the superior mesenteric artery. Superior mesenteric artery angiogram was performed.  Subsequently, a combination of the micro catheter a micro wire were used to select various branches of superior mesenteric artery in this patient with a celiacomesenteric trunk. A standard renegade micro catheter as well as a soft synchro an a standard .016 synchro wire were used to select branches of the superior mesenteric artery. Sub selection included:  Replaced common hepatic artery.  Right hepatic artery.  New middle colicky artery.  Proximal jejunal branches.  Two base catheter  exchanges were made on the Bentson wire including exchange for Mickelson catheter and exchanged for a C2 Cobra catheter for formation of a Waltman loop.  After dedicated angiogram of each of the sub selected arteries, all catheters and wires were removed.  Angiogram through the sheath was performed with the common femoral artery.  Deployment of an Exoseal device for hemostasis failed without deployment. Manual pressure was used for hemostasis of the right common femoral artery.  Patient tolerated procedure well and remained hemodynamically stable throughout. At the end of the procedure, the patient's vital signs were within normal range. Systolic brought pressure 110. Heart rate of 80-90. No syncopal events, no altered mental status, no hematemesis, and no hematochezia/melena.  Marland Kitchen  FINDINGS: Aortic angiogram:  Normal course caliber and contour of the abdominal aorta. Multiple segmental vessels fill within the lower thoracic spine and lumbar spine. No aneurysm dissection flap identified. No significant atherosclerotic changes.  Steep angled take-off of the bilateral common iliac arteries.  Single bilateral renal arteries.  No celiac artery origin.  Superior mesenteric artery patent, with configuration of a celiacomesenteric trunk.  Late phase of the aortic rind demonstrates no evidence of bleeding.  Celiacomesenteric angiogram:  Normal caliber and contour of the origin of the trunk. Multiple jejunal branches present. Normal caliber and contour of the ileocolic artery. Multiple tortuous vessels overlie the proximal 3rd of the trunk, with evidence of a replaced common hepatic artery contributing to both right and left hepatic arteries. There is a replaced vessel representing a celiac vessel, with cross-filling at a bifurcation to the splenic artery and the hepatic arteries. Patent left gastric artery. New  Late phase of the trunk injection demonstrates no evidence of bleeding. No tumor blush or pooling of contrast.  Patent portal venous system identified.  Common hepatic artery injection:  Replaced common hepatic artery with right hepatic arteries and left hepatic arteries from the replaced trunk. No evidence of tumor blush, aneurysm, dissection flap, pulling of contrast. No abnormal enhancement identified. No pseudoaneurysm identified.  No call it artery injection:  Patent middle call it artery of normal caliber. Significant cross  flow from collateral vessels with no pooling of contrast, pseudoaneurysm, or tumor blush.  Proximal jejunal arcade:  Normal course caliber and contour. Normal blush of the proximal jejunal mucosa. No filling defects, or pseudoaneurysm.  No embolization was performed.  IMPRESSION: Status post diagnostic mesenteric angiogram.  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  No bleeding was identified from the replaced common hepatic artery, which would be the best candidate for supplying the pancreaticoduodenal arteries near the known proximal duodenum ulcer. Because no bleeding was identified of this variant anatomy, no empiric embolization was performed.  Signed,  Dulcy Fanny. Earleen Newport, DO  [facility]  [facility]  PLAN: The patient will require 6 hours flattened bed with log roll only for hemostasis of the right common femoral artery after manual compression.  Agree with ICU management, including serial hemoglobin and hematocrit checks.   Electronically Signed   By: Corrie Mckusick D.O.   On: 05/05/2014 16:37   Ir Angiogram Selective Each Additional Vessel  05/05/2014   INDICATION: 34 year old female admitted to the hospital with upper GI bleeding and hematemesis.  She was admitted April 26, 2013. Upper endoscopy demonstrates duodenum ulcer.  She has been stable over 1 week, though developed syncopal episode and acute anemia with a hemoglobin of 5.7 on today's date. She has been referred  for evaluation and treatment of the duodenum ulcer hemorrhage.  EXAM: 1. AORTIC ANGIOGRAM 2. SUPERIOR MESENTERIC ARTERIOGRAM 3. SUB SELECTION OF FOUR TERTIARY BRANCHES FROM THE SUPERIOR MESENTERIC ARTERY 4. ULTRASOUND GUIDED VASCULAR ACCESS OF THE RIGHT COMMON FEMORAL ARTERY 5. MANUAL PRESSURE FOR HEMOSTASIS.  COMPARISON:  CT abdomen pelvis, 04/27/2013, 09/22/2001  MEDICATIONS: Versed 2.0 mg IV; Fentanyl 100 mcg IV  CONTRAST:  158m OMNIPAQUE IOHEXOL 300 MG/ML  SOLN  ANESTHESIA/SEDATION: One hundred fifty minutes  FLUOROSCOPY TIME:  Forty-one minutes.  Fifty-four seconds.  2941 mGy  ACCESS: Right common femoral artery; hemostasis achieved with manual compression.  COMPLICATIONS: None immediate  TECHNIQUE: Informed written consent was obtained from the patient after a discussion of the risks, benefits and alternatives to treatment. Questions regarding the procedure were encouraged and answered. A timeout was performed prior to the initiation of the procedure.  The right groin was prepped and draped in the usual sterile fashion, and a sterile drape was applied covering the operative field. Maximum barrier sterile technique with sterile gowns and gloves were used for the procedure. A timeout was performed prior to the initiation of the procedure. Local anesthesia was provided with 1% lidocaine.  Physical exam was performed to identify the location of the right common femoral artery. Ultrasound survey was performed with images stored sent to PACs.  Once the common femoral artery was identified under ultrasound, ultrasound guidance was used to generously infiltrate the skin and subcutaneous tissues with 1% lidocaine without epinephrine. A micropuncture access was then used to puncture the right common femoral artery. With excellent blood flow returned, and micro wire was advanced through the needle observed under fluoroscopy to enter the iliac system. The needle was removed from wire a micropuncture kit was advanced over  the wire. The inner dilator and cannula were removed with a wire, and an 035 Bentson wire was advanced into the abdominal aorta under fluoroscopy the micro puncture was removed, and a 5 French sheath was advanced over the Bentson wire into the common femoral artery. The dilator was removed and the sheath was flushed.  A pigtail Flush catheter was advanced  over the Bentson wire into the lower thoracic aorta. Aortic flush angiogram performed.  Pigtail catheter was then exchanged over wire for C2 Cobra catheter which was used to select the superior mesenteric artery. Superior mesenteric artery angiogram was performed.  Subsequently, a combination of the micro catheter a micro wire were used to select various branches of superior mesenteric artery in this patient with a celiacomesenteric trunk. A standard renegade micro catheter as well as a soft synchro an a standard .016 synchro wire were used to select branches of the superior mesenteric artery. Sub selection included:  Replaced common hepatic artery.  Right hepatic artery.  New middle colicky artery.  Proximal jejunal branches.  Two base catheter exchanges were made on the Bentson wire including exchange for Mickelson catheter and exchanged for a C2 Cobra catheter for formation of a Waltman loop.  After dedicated angiogram of each of the sub selected arteries, all catheters and wires were removed.  Angiogram through the sheath was performed with the common femoral artery.  Deployment of an Exoseal device for hemostasis failed without deployment. Manual pressure was used for hemostasis of the right common femoral artery.  Patient tolerated procedure well and remained hemodynamically stable throughout. At the end of the procedure, the patient's vital signs were within normal range. Systolic brought pressure 110. Heart rate of 80-90. No syncopal events, no altered mental status, no hematemesis, and no hematochezia/melena.  Marland Kitchen  FINDINGS: Aortic angiogram:  Normal course  caliber and contour of the abdominal aorta. Multiple segmental vessels fill within the lower thoracic spine and lumbar spine. No aneurysm dissection flap identified. No significant atherosclerotic changes.  Steep angled take-off of the bilateral common iliac arteries.  Single bilateral renal arteries.  No celiac artery origin.  Superior mesenteric artery patent, with configuration of a celiacomesenteric trunk.  Late phase of the aortic rind demonstrates no evidence of bleeding.  Celiacomesenteric angiogram:  Normal caliber and contour of the origin of the trunk. Multiple jejunal branches present. Normal caliber and contour of the ileocolic artery. Multiple tortuous vessels overlie the proximal 3rd of the trunk, with evidence of a replaced common hepatic artery contributing to both right and left hepatic arteries. There is a replaced vessel representing a celiac vessel, with cross-filling at a bifurcation to the splenic artery and the hepatic arteries. Patent left gastric artery. New  Late phase of the trunk injection demonstrates no evidence of bleeding. No tumor blush or pooling of contrast. Patent portal venous system identified.  Common hepatic artery injection:  Replaced common hepatic artery with right hepatic arteries and left hepatic arteries from the replaced trunk. No evidence of tumor blush, aneurysm, dissection flap, pulling of contrast. No abnormal enhancement identified. No pseudoaneurysm identified.  No call it artery injection:  Patent middle call it artery of normal caliber. Significant cross flow from collateral vessels with no pooling of contrast, pseudoaneurysm, or tumor blush.  Proximal jejunal arcade:  Normal course caliber and contour. Normal blush of the proximal jejunal mucosa. No filling defects, or pseudoaneurysm.  No embolization was performed.  IMPRESSION: Status post diagnostic mesenteric angiogram.  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  No bleeding was identified from the replaced common hepatic artery, which would be the best candidate for supplying the pancreaticoduodenal arteries near the known proximal duodenum ulcer. Because no bleeding was identified of this variant anatomy, no empiric embolization was performed.  Signed,  Dulcy Fanny. Earleen Newport DO  [facility]  [facility]  PLAN: The patient will require 6 hours flattened bed with log roll only for hemostasis of the right common femoral artery after manual compression.  Agree with ICU management, including serial hemoglobin and hematocrit checks.   Electronically Signed   By: Corrie Mckusick D.O.   On: 05/05/2014 16:37   Ir Angiogram Selective Each Additional Vessel  05/05/2014   INDICATION: 34 year old female admitted to the hospital with upper GI bleeding and hematemesis.  She was admitted April 26, 2013. Upper endoscopy demonstrates duodenum ulcer.  She has been stable over 1 week, though developed syncopal episode and acute anemia with a hemoglobin of 5.7 on today's date. She has been referred for evaluation and treatment of the duodenum ulcer hemorrhage.  EXAM: 1. AORTIC ANGIOGRAM 2. SUPERIOR MESENTERIC ARTERIOGRAM 3. SUB SELECTION OF FOUR TERTIARY BRANCHES FROM THE SUPERIOR MESENTERIC ARTERY 4. ULTRASOUND GUIDED VASCULAR ACCESS OF THE RIGHT COMMON FEMORAL ARTERY 5. MANUAL PRESSURE FOR HEMOSTASIS.  COMPARISON:  CT abdomen pelvis, 04/27/2013, 09/22/2001  MEDICATIONS: Versed 2.0 mg IV; Fentanyl 100 mcg IV  CONTRAST:  149m OMNIPAQUE IOHEXOL 300 MG/ML  SOLN  ANESTHESIA/SEDATION: One hundred fifty minutes  FLUOROSCOPY TIME:  Forty-one minutes.  Fifty-four seconds.  2941 mGy  ACCESS: Right common femoral artery; hemostasis achieved with manual compression.  COMPLICATIONS: None immediate  TECHNIQUE: Informed written consent was obtained from the patient after a discussion of the risks, benefits and alternatives to  treatment. Questions regarding the procedure were encouraged and answered. A timeout was performed prior to the initiation of the procedure.  The right groin was prepped and draped in the usual sterile fashion, and a sterile drape was applied covering the operative field. Maximum barrier sterile technique with sterile gowns and gloves were used for the procedure. A timeout was performed prior to the initiation of the procedure. Local anesthesia was provided with 1% lidocaine.  Physical exam was performed to identify the location of the right common femoral artery. Ultrasound survey was performed with images stored sent to PACs.  Once the common femoral artery was identified under ultrasound, ultrasound guidance was used to generously infiltrate the skin and subcutaneous tissues with 1% lidocaine without epinephrine. A micropuncture access was then used to puncture the right common femoral artery. With excellent blood flow returned, and micro wire was advanced through the needle observed under fluoroscopy to enter the iliac system. The needle was removed from wire a micropuncture kit was advanced over the wire. The inner dilator and cannula were removed with a wire, and an 035 Bentson wire was advanced into the abdominal aorta under fluoroscopy the micro puncture was removed, and a 5 French sheath was advanced over the Bentson wire into the common femoral artery. The dilator was removed and the sheath was flushed.  A pigtail Flush catheter was advanced over the Bentson wire into the lower thoracic aorta. Aortic flush angiogram performed.  Pigtail catheter was then exchanged over wire for C2 Cobra catheter which was used to select the superior mesenteric artery. Superior mesenteric artery angiogram was performed.  Subsequently, a combination of the micro catheter a micro wire were used to select various branches of superior mesenteric artery in this patient with a celiacomesenteric trunk. A standard renegade micro  catheter as well as a soft synchro an a standard .016 synchro wire were used to select branches of the superior mesenteric artery. Sub selection included:  Replaced common hepatic artery.  Right hepatic artery.  New middle colicky artery.  Proximal jejunal branches.  Two  base catheter exchanges were made on the Bentson wire including exchange for Mickelson catheter and exchanged for a C2 Cobra catheter for formation of a Waltman loop.  After dedicated angiogram of each of the sub selected arteries, all catheters and wires were removed.  Angiogram through the sheath was performed with the common femoral artery.  Deployment of an Exoseal device for hemostasis failed without deployment. Manual pressure was used for hemostasis of the right common femoral artery.  Patient tolerated procedure well and remained hemodynamically stable throughout. At the end of the procedure, the patient's vital signs were within normal range. Systolic brought pressure 110. Heart rate of 80-90. No syncopal events, no altered mental status, no hematemesis, and no hematochezia/melena.  Marland Kitchen  FINDINGS: Aortic angiogram:  Normal course caliber and contour of the abdominal aorta. Multiple segmental vessels fill within the lower thoracic spine and lumbar spine. No aneurysm dissection flap identified. No significant atherosclerotic changes.  Steep angled take-off of the bilateral common iliac arteries.  Single bilateral renal arteries.  No celiac artery origin.  Superior mesenteric artery patent, with configuration of a celiacomesenteric trunk.  Late phase of the aortic rind demonstrates no evidence of bleeding.  Celiacomesenteric angiogram:  Normal caliber and contour of the origin of the trunk. Multiple jejunal branches present. Normal caliber and contour of the ileocolic artery. Multiple tortuous vessels overlie the proximal 3rd of the trunk, with evidence of a replaced common hepatic artery contributing to both right and left hepatic arteries.  There is a replaced vessel representing a celiac vessel, with cross-filling at a bifurcation to the splenic artery and the hepatic arteries. Patent left gastric artery. New  Late phase of the trunk injection demonstrates no evidence of bleeding. No tumor blush or pooling of contrast. Patent portal venous system identified.  Common hepatic artery injection:  Replaced common hepatic artery with right hepatic arteries and left hepatic arteries from the replaced trunk. No evidence of tumor blush, aneurysm, dissection flap, pulling of contrast. No abnormal enhancement identified. No pseudoaneurysm identified.  No call it artery injection:  Patent middle call it artery of normal caliber. Significant cross flow from collateral vessels with no pooling of contrast, pseudoaneurysm, or tumor blush.  Proximal jejunal arcade:  Normal course caliber and contour. Normal blush of the proximal jejunal mucosa. No filling defects, or pseudoaneurysm.  No embolization was performed.  IMPRESSION: Status post diagnostic mesenteric angiogram.  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  No bleeding was identified from the replaced common hepatic artery, which would be the best candidate for supplying the pancreaticoduodenal arteries near the known proximal duodenum ulcer. Because no bleeding was identified of this variant anatomy, no empiric embolization was performed.  Signed,  Dulcy Fanny. Earleen Newport, DO  [facility]  [facility]  PLAN: The patient will require 6 hours flattened bed with log roll only for hemostasis of the right common femoral artery after manual compression.  Agree with ICU management, including serial hemoglobin and hematocrit checks.   Electronically Signed   By: Corrie Mckusick D.O.   On: 05/05/2014 16:37   Ir Angiogram Selective Each Additional Vessel  05/05/2014   INDICATION: 34 year old female admitted to  the hospital with upper GI bleeding and hematemesis.  She was admitted April 26, 2013. Upper endoscopy demonstrates duodenum ulcer.  She has been stable over 1 week, though developed syncopal episode and acute anemia with a hemoglobin of 5.7 on today's date. She has been  referred for evaluation and treatment of the duodenum ulcer hemorrhage.  EXAM: 1. AORTIC ANGIOGRAM 2. SUPERIOR MESENTERIC ARTERIOGRAM 3. SUB SELECTION OF FOUR TERTIARY BRANCHES FROM THE SUPERIOR MESENTERIC ARTERY 4. ULTRASOUND GUIDED VASCULAR ACCESS OF THE RIGHT COMMON FEMORAL ARTERY 5. MANUAL PRESSURE FOR HEMOSTASIS.  COMPARISON:  CT abdomen pelvis, 04/27/2013, 09/22/2001  MEDICATIONS: Versed 2.0 mg IV; Fentanyl 100 mcg IV  CONTRAST:  178m OMNIPAQUE IOHEXOL 300 MG/ML  SOLN  ANESTHESIA/SEDATION: One hundred fifty minutes  FLUOROSCOPY TIME:  Forty-one minutes.  Fifty-four seconds.  2941 mGy  ACCESS: Right common femoral artery; hemostasis achieved with manual compression.  COMPLICATIONS: None immediate  TECHNIQUE: Informed written consent was obtained from the patient after a discussion of the risks, benefits and alternatives to treatment. Questions regarding the procedure were encouraged and answered. A timeout was performed prior to the initiation of the procedure.  The right groin was prepped and draped in the usual sterile fashion, and a sterile drape was applied covering the operative field. Maximum barrier sterile technique with sterile gowns and gloves were used for the procedure. A timeout was performed prior to the initiation of the procedure. Local anesthesia was provided with 1% lidocaine.  Physical exam was performed to identify the location of the right common femoral artery. Ultrasound survey was performed with images stored sent to PACs.  Once the common femoral artery was identified under ultrasound, ultrasound guidance was used to generously infiltrate the skin and subcutaneous tissues with 1% lidocaine without epinephrine. A  micropuncture access was then used to puncture the right common femoral artery. With excellent blood flow returned, and micro wire was advanced through the needle observed under fluoroscopy to enter the iliac system. The needle was removed from wire a micropuncture kit was advanced over the wire. The inner dilator and cannula were removed with a wire, and an 035 Bentson wire was advanced into the abdominal aorta under fluoroscopy the micro puncture was removed, and a 5 French sheath was advanced over the Bentson wire into the common femoral artery. The dilator was removed and the sheath was flushed.  A pigtail Flush catheter was advanced over the Bentson wire into the lower thoracic aorta. Aortic flush angiogram performed.  Pigtail catheter was then exchanged over wire for C2 Cobra catheter which was used to select the superior mesenteric artery. Superior mesenteric artery angiogram was performed.  Subsequently, a combination of the micro catheter a micro wire were used to select various branches of superior mesenteric artery in this patient with a celiacomesenteric trunk. A standard renegade micro catheter as well as a soft synchro an a standard .016 synchro wire were used to select branches of the superior mesenteric artery. Sub selection included:  Replaced common hepatic artery.  Right hepatic artery.  New middle colicky artery.  Proximal jejunal branches.  Two base catheter exchanges were made on the Bentson wire including exchange for Mickelson catheter and exchanged for a C2 Cobra catheter for formation of a Waltman loop.  After dedicated angiogram of each of the sub selected arteries, all catheters and wires were removed.  Angiogram through the sheath was performed with the common femoral artery.  Deployment of an Exoseal device for hemostasis failed without deployment. Manual pressure was used for hemostasis of the right common femoral artery.  Patient tolerated procedure well and remained hemodynamically  stable throughout. At the end of the procedure, the patient's vital signs were within normal range. Systolic brought pressure 110. Heart rate of 80-90. No syncopal events, no altered mental  status, no hematemesis, and no hematochezia/melena.  Marland Kitchen  FINDINGS: Aortic angiogram:  Normal course caliber and contour of the abdominal aorta. Multiple segmental vessels fill within the lower thoracic spine and lumbar spine. No aneurysm dissection flap identified. No significant atherosclerotic changes.  Steep angled take-off of the bilateral common iliac arteries.  Single bilateral renal arteries.  No celiac artery origin.  Superior mesenteric artery patent, with configuration of a celiacomesenteric trunk.  Late phase of the aortic rind demonstrates no evidence of bleeding.  Celiacomesenteric angiogram:  Normal caliber and contour of the origin of the trunk. Multiple jejunal branches present. Normal caliber and contour of the ileocolic artery. Multiple tortuous vessels overlie the proximal 3rd of the trunk, with evidence of a replaced common hepatic artery contributing to both right and left hepatic arteries. There is a replaced vessel representing a celiac vessel, with cross-filling at a bifurcation to the splenic artery and the hepatic arteries. Patent left gastric artery. New  Late phase of the trunk injection demonstrates no evidence of bleeding. No tumor blush or pooling of contrast. Patent portal venous system identified.  Common hepatic artery injection:  Replaced common hepatic artery with right hepatic arteries and left hepatic arteries from the replaced trunk. No evidence of tumor blush, aneurysm, dissection flap, pulling of contrast. No abnormal enhancement identified. No pseudoaneurysm identified.  No call it artery injection:  Patent middle call it artery of normal caliber. Significant cross flow from collateral vessels with no pooling of contrast, pseudoaneurysm, or tumor blush.  Proximal jejunal arcade:  Normal  course caliber and contour. Normal blush of the proximal jejunal mucosa. No filling defects, or pseudoaneurysm.  No embolization was performed.  IMPRESSION: Status post diagnostic mesenteric angiogram.  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  No bleeding was identified from the replaced common hepatic artery, which would be the best candidate for supplying the pancreaticoduodenal arteries near the known proximal duodenum ulcer. Because no bleeding was identified of this variant anatomy, no empiric embolization was performed.  Signed,  Dulcy Fanny. Earleen Newport, DO  [facility]  [facility]  PLAN: The patient will require 6 hours flattened bed with log roll only for hemostasis of the right common femoral artery after manual compression.  Agree with ICU management, including serial hemoglobin and hematocrit checks.   Electronically Signed   By: Corrie Mckusick D.O.   On: 05/05/2014 16:37   Ir US Guide Vasc Access Right  05/05/2014   INDICATION: 34 year old female admitted to the hospital with upper GI bleeding and hematemesis.  She was admitted April 26, 2013. Upper endoscopy demonstrates duodenum ulcer.  She has been stable over 1 week, though developed syncopal episode and acute anemia with a hemoglobin of 5.7 on today's date. She has been referred for evaluation and treatment of the duodenum ulcer hemorrhage.  EXAM: 1. AORTIC ANGIOGRAM 2. SUPERIOR MESENTERIC ARTERIOGRAM 3. SUB SELECTION OF FOUR TERTIARY BRANCHES FROM THE SUPERIOR MESENTERIC ARTERY 4. ULTRASOUND GUIDED VASCULAR ACCESS OF THE RIGHT COMMON FEMORAL ARTERY 5. MANUAL PRESSURE FOR HEMOSTASIS.  COMPARISON:  CT abdomen pelvis, 04/27/2013, 09/22/2001  MEDICATIONS: Versed 2.0 mg IV; Fentanyl 100 mcg IV  CONTRAST:  129m OMNIPAQUE IOHEXOL 300 MG/ML  SOLN  ANESTHESIA/SEDATION: One hundred fifty minutes  FLUOROSCOPY TIME:  Forty-one minutes.   Fifty-four seconds.  2941 mGy  ACCESS: Right common femoral artery; hemostasis achieved with manual compression.  COMPLICATIONS: None immediate  TECHNIQUE: Informed written consent was obtained from the  patient after a discussion of the risks, benefits and alternatives to treatment. Questions regarding the procedure were encouraged and answered. A timeout was performed prior to the initiation of the procedure.  The right groin was prepped and draped in the usual sterile fashion, and a sterile drape was applied covering the operative field. Maximum barrier sterile technique with sterile gowns and gloves were used for the procedure. A timeout was performed prior to the initiation of the procedure. Local anesthesia was provided with 1% lidocaine.  Physical exam was performed to identify the location of the right common femoral artery. Ultrasound survey was performed with images stored sent to PACs.  Once the common femoral artery was identified under ultrasound, ultrasound guidance was used to generously infiltrate the skin and subcutaneous tissues with 1% lidocaine without epinephrine. A micropuncture access was then used to puncture the right common femoral artery. With excellent blood flow returned, and micro wire was advanced through the needle observed under fluoroscopy to enter the iliac system. The needle was removed from wire a micropuncture kit was advanced over the wire. The inner dilator and cannula were removed with a wire, and an 035 Bentson wire was advanced into the abdominal aorta under fluoroscopy the micro puncture was removed, and a 5 French sheath was advanced over the Bentson wire into the common femoral artery. The dilator was removed and the sheath was flushed.  A pigtail Flush catheter was advanced over the Bentson wire into the lower thoracic aorta. Aortic flush angiogram performed.  Pigtail catheter was then exchanged over wire for C2 Cobra catheter which was used to select the superior  mesenteric artery. Superior mesenteric artery angiogram was performed.  Subsequently, a combination of the micro catheter a micro wire were used to select various branches of superior mesenteric artery in this patient with a celiacomesenteric trunk. A standard renegade micro catheter as well as a soft synchro an a standard .016 synchro wire were used to select branches of the superior mesenteric artery. Sub selection included:  Replaced common hepatic artery.  Right hepatic artery.  New middle colicky artery.  Proximal jejunal branches.  Two base catheter exchanges were made on the Bentson wire including exchange for Mickelson catheter and exchanged for a C2 Cobra catheter for formation of a Waltman loop.  After dedicated angiogram of each of the sub selected arteries, all catheters and wires were removed.  Angiogram through the sheath was performed with the common femoral artery.  Deployment of an Exoseal device for hemostasis failed without deployment. Manual pressure was used for hemostasis of the right common femoral artery.  Patient tolerated procedure well and remained hemodynamically stable throughout. At the end of the procedure, the patient's vital signs were within normal range. Systolic brought pressure 110. Heart rate of 80-90. No syncopal events, no altered mental status, no hematemesis, and no hematochezia/melena.  Marland Kitchen  FINDINGS: Aortic angiogram:  Normal course caliber and contour of the abdominal aorta. Multiple segmental vessels fill within the lower thoracic spine and lumbar spine. No aneurysm dissection flap identified. No significant atherosclerotic changes.  Steep angled take-off of the bilateral common iliac arteries.  Single bilateral renal arteries.  No celiac artery origin.  Superior mesenteric artery patent, with configuration of a celiacomesenteric trunk.  Late phase of the aortic rind demonstrates no evidence of bleeding.  Celiacomesenteric angiogram:  Normal caliber and contour of the  origin of the trunk. Multiple jejunal branches present. Normal caliber and contour of the ileocolic artery. Multiple tortuous vessels overlie the  proximal 3rd of the trunk, with evidence of a replaced common hepatic artery contributing to both right and left hepatic arteries. There is a replaced vessel representing a celiac vessel, with cross-filling at a bifurcation to the splenic artery and the hepatic arteries. Patent left gastric artery. New  Late phase of the trunk injection demonstrates no evidence of bleeding. No tumor blush or pooling of contrast. Patent portal venous system identified.  Common hepatic artery injection:  Replaced common hepatic artery with right hepatic arteries and left hepatic arteries from the replaced trunk. No evidence of tumor blush, aneurysm, dissection flap, pulling of contrast. No abnormal enhancement identified. No pseudoaneurysm identified.  No call it artery injection:  Patent middle call it artery of normal caliber. Significant cross flow from collateral vessels with no pooling of contrast, pseudoaneurysm, or tumor blush.  Proximal jejunal arcade:  Normal course caliber and contour. Normal blush of the proximal jejunal mucosa. No filling defects, or pseudoaneurysm.  No embolization was performed.  IMPRESSION: Status post diagnostic mesenteric angiogram.  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  No bleeding was identified from the replaced common hepatic artery, which would be the best candidate for supplying the pancreaticoduodenal arteries near the known proximal duodenum ulcer. Because no bleeding was identified of this variant anatomy, no empiric embolization was performed.  Signed,  Dulcy Fanny. Earleen Newport, DO  [facility]  [facility]  PLAN: The patient will require 6 hours flattened bed with log roll only for hemostasis of the right common femoral artery after  manual compression.  Agree with ICU management, including serial hemoglobin and hematocrit checks.   Electronically Signed   By: Corrie Mckusick D.O.   On: 05/05/2014 16:37     CBC  Recent Labs Lab 05/06/14 0400 05/07/14 0500  05/08/14 0023  05/08/14 2120 05/09/14 0345 05/09/14 1100 05/09/14 1810 05/10/14 0430  WBC 9.9 9.9  --  23.5*  --   --  14.7*  --   --  11.2*  HGB 9.0* 7.8*  < > 11.7*  < > 9.9* 9.5* 8.8* 9.4* 8.1*  HCT 26.0* 23.1*  < > 34.2*  < > 28.8* 28.2* 26.2* 27.6* 24.4*  PLT 181 217  --  149*  --   --  211  --   --  215  MCV 84.7 86.2  --  84.9  --   --  87.6  --   --  88.4  MCH 29.3 29.1  --  29.0  --   --  29.5  --   --  29.3  MCHC 34.6 33.8  --  34.2  --   --  33.7  --   --  33.2  RDW 15.1 15.3  --  14.7  --   --  16.1*  --   --  16.0*  < > = values in this interval not displayed.  Chemistries   Recent Labs Lab 05/04/14 0518  05/05/14 1800 05/06/14 1010 05/07/14 0500 05/08/14 0023 05/09/14 0310 05/10/14 0430  NA 138  < >  --  135 137 136 135 138  K 3.7  < > 3.6 3.6 3.7 4.0 3.5 3.4*  CL 107  < >  --  108 109 112 104 103  CO2 27  < >  --  _0 GLUCOSE 93  < >  --  85 102* 152* 135* 109*  BUN <5*  < >  --  _0 <5* <5*  CREATININE 0.58  < >  --  0.48* 0.53 0.49* 0.61 0.63  CALCIUM 8.1*  < >  --  7.5* 7.7* 7.3* 7.8* 7.9*  MG  --   --  1.8  --  2.0  --  1.8 1.9  AST 13  --   --   --   --   --   --   --   ALT 11  --   --   --   --   --   --   --   ALKPHOS 59  --   --   --   --   --   --   --   BILITOT 0.5  --   --   --   --   --   --   --   < > = values in this interval not displayed. ------------------------------------------------------------------------------------------------------------------ estimated creatinine clearance is 97.9 mL/min (by C-G formula based on Cr of 0.63). ------------------------------------------------------------------------------------------------------------------ No results for input(s): HGBA1C in the last 72  hours. ------------------------------------------------------------------------------------------------------------------ No results for input(s): CHOL, HDL, LDLCALC, TRIG, CHOLHDL, LDLDIRECT in the last 72 hours. ------------------------------------------------------------------------------------------------------------------ No results for input(s): TSH, T4TOTAL, T3FREE, THYROIDAB in the last 72 hours.  Invalid input(s): FREET3 ------------------------------------------------------------------------------------------------------------------ No results for input(s): VITAMINB12, FOLATE, FERRITIN, TIBC, IRON, RETICCTPCT in the last 72 hours.  Coagulation profile  Recent Labs Lab 05/04/14 0518 05/07/14 2020  INR 1.04 1.18    No results for input(s): DDIMER in the last 72 hours.  Cardiac Enzymes No results for input(s): CKMB, TROPONINI, MYOGLOBIN in the last 168 hours.  Invalid input(s): CK ------------------------------------------------------------------------------------------------------------------ Invalid input(s): POCBNP     Time Spent in minutes   45   Lala Lund K M.D on 05/10/2014 at 10:15 AM  Between 7am to 7pm - Pager - [phone]  After 7pm go to [email or web address] - password [facility]  [facility]   Office  [phone]

## 2014-05-10 NOTE — Progress Notes (Signed)
NUTRITION FOLLOW UP  Intervention:   -RD to follow for diet advancement -Supplement diet as appropriate -If pt unable to advance to PO diet by 05/15/14, consider initiation of nutrition support  Nutrition Dx:   Inadequate oral intake related to GI bleeding as evidenced by decreased PO intake; ongoing  Goal:   Pt to meet >/= 90% of estimated energy needs; not met  Monitor:   Diet advancement, PO intake, weight trends, labs, I/O's  Assessment:   34 y/o female admitted 2/26 after syncopal episode. Presented with major upper GI bleed likely in setting of NSAID/ETOH usage. Massive cratered duodenal bulb ulcer noted on EGD.   S/p Procedure(s) on 05/07/14: EXPLORATORY LAPAROTOMY OVERSOWE DUODENAL ULCER  Pt with NGT to low intermittent suction. Flow sheet reveals 200-250 ml output daily.  Pt has been NPO/clear liquids x 6 days. Per surgical notes, pt was need UGI prior to NGT removal and start of PO intake.  Labs reviewed. K: 3.4, BUN <5, Calcium: 7.9, Glucose: 109.   Height: Ht Readings from Last 1 Encounters:  05/08/14 '5\' 3"'  (1.6 m)    Weight Status:   Wt Readings from Last 1 Encounters:  05/08/14 171 lb 15.3 oz (78 kg)   05/06/14 156 lb 1.6 oz (70.806 kg)       Re-estimated needs:  Kcal: 1700-1900 Protein: 70-80 grams Fluid: 1.7-1.9 L  Skin: closed rt groin incision, closed abdominal incision, JP drain to midline abdomen  Diet Order: Diet NPO time specified Except for: Ice Chips   Intake/Output Summary (Last 24 hours) at 05/10/14 0919 Last data filed at 05/10/14 0600  Gross per 24 hour  Intake   1100 ml  Output   1190 ml  Net    -90 ml    Last BM: 05/09/14   Labs:   Recent Labs Lab 05/07/14 0500 05/08/14 0023 05/09/14 0310 05/10/14 0430  NA 137 136 135 138  K 3.7 4.0 3.5 3.4*  CL 109 112 104 103  CO2 '24 21 28 28  ' BUN 14 12 <5* <5*  CREATININE 0.53 0.49* 0.61 0.63  CALCIUM 7.7* 7.3* 7.8* 7.9*  MG 2.0  --  1.8 1.9  PHOS 2.8  --  2.2* 3.2  GLUCOSE  102* 152* 135* 109*    CBG (last 3)  No results for input(s): GLUCAP in the last 72 hours.  Scheduled Meds: . antiseptic oral rinse  7 mL Mouth Rinse BID  . busPIRone  7.5 mg Oral BID  . fentaNYL  50 mcg Transdermal Q72H  . fluticasone  2 spray Each Nare Daily  . HYDROmorphone PCA 0.3 mg/mL   Intravenous 6 times per day  . levothyroxine  25 mcg Intravenous Daily  . nicotine  14 mg Transdermal Daily  . [START ON 05/12/2014] pantoprazole (PROTONIX) IV  40 mg Intravenous Q12H  . sodium chloride  1 spray Each Nare BID  . sucralfate  1 g Oral TID WC & HS    Continuous Infusions: . dextrose 5 % and 0.45% NaCl 1,000 mL with potassium chloride 20 mEq infusion 100 mL/hr at 05/09/14 1641  . pantoprozole (PROTONIX) infusion 8 mg/hr (05/09/14 1834)    [place] A. Jimmye Norman, RD, LDN, CDE Pager: [phone] After hours Pager: [phone]

## 2014-05-11 LAB — CBC
HCT: 23.9 % — ABNORMAL LOW (ref 36.0–46.0)
Hemoglobin: 7.8 g/dL — ABNORMAL LOW (ref 12.0–15.0)
MCH: 29.1 pg (ref 26.0–34.0)
MCHC: 32.6 g/dL (ref 30.0–36.0)
MCV: 89.2 fL (ref 78.0–100.0)
Platelets: 279 10*3/uL (ref 150–400)
RBC: 2.68 MIL/uL — ABNORMAL LOW (ref 3.87–5.11)
RDW: 15.5 % (ref 11.5–15.5)
WBC: 8.2 10*3/uL (ref 4.0–10.5)

## 2014-05-11 LAB — URINE CULTURE
Colony Count: NO GROWTH
Culture: NO GROWTH

## 2014-05-11 LAB — BASIC METABOLIC PANEL
Anion gap: 9 (ref 5–15)
BUN: <5 mg/dL — ABNORMAL LOW (ref 6–23)
CO2: 23 mmol/L (ref 19–32)
Calcium: 8.1 mg/dL — ABNORMAL LOW (ref 8.4–10.5)
Chloride: 106 mmol/L (ref 96–112)
Creatinine, Ser: 0.54 mg/dL (ref 0.50–1.10)
GFR calc Af Amer: >90 mL/min (ref >90)
GFR calc non Af Amer: >90 mL/min (ref >90)
Glucose, Bld: 109 mg/dL — ABNORMAL HIGH (ref 70–99)
Potassium: 3.5 mmol/L (ref 3.5–5.1)
Sodium: 138 mmol/L (ref 135–145)

## 2014-05-11 MED ORDER — LORAZEPAM 2 MG/ML IJ SOLN
0.5000 mg | Freq: Four times a day (QID) | INTRAMUSCULAR | Status: DC | PRN
  Administered 2014-05-11 – 2014-05-14 (×9): 0.5 mg via INTRAVENOUS
  Filled 2014-05-11 (×9): qty 1

## 2014-05-11 MED ORDER — DIPHENHYDRAMINE HCL 50 MG/ML IJ SOLN: 12.5000 mg | Freq: Four times a day (QID) | INTRAMUSCULAR | Status: DC | PRN

## 2014-05-11 MED ORDER — DIPHENHYDRAMINE HCL 12.5 MG/5ML PO ELIX: 12.5000 mg | ORAL_SOLUTION | Freq: Four times a day (QID) | ORAL | Status: DC | PRN

## 2014-05-11 MED ORDER — HYDROMORPHONE 0.3 MG/ML IV SOLN
INTRAVENOUS | Status: DC
  Administered 2014-05-11: 22:00:00 via INTRAVENOUS
  Administered 2014-05-11: 3 mg via INTRAVENOUS
  Administered 2014-05-11: 11:00:00 via INTRAVENOUS
  Administered 2014-05-11: 1.58 mg via INTRAVENOUS
  Administered 2014-05-11: 3.9 mg via INTRAVENOUS
  Administered 2014-05-11: 1.8 mg via INTRAVENOUS
  Administered 2014-05-12: 1.5 mg via INTRAVENOUS
  Filled 2014-05-11 (×2): qty 25

## 2014-05-11 MED ORDER — POTASSIUM CHLORIDE 2 MEQ/ML IV SOLN
INTRAVENOUS | Status: DC
  Administered 2014-05-11 – 2014-05-12 (×3): via INTRAVENOUS
  Filled 2014-05-11 (×6): qty 1000

## 2014-05-11 MED ORDER — VENLAFAXINE HCL ER 150 MG PO CP24
150.0000 mg | ORAL_CAPSULE | Freq: Every day | ORAL | Status: DC
  Administered 2014-05-11 – 2014-05-14 (×4): 150 mg via ORAL
  Filled 2014-05-11: qty 2
  Filled 2014-05-11 (×3): qty 1
  Filled 2014-05-11 (×2): qty 2
  Filled 2014-05-11: qty 1

## 2014-05-11 NOTE — Plan of Care (Signed)
Problem: Phase III Progression Outcomes Goal: Activity at appropriate level-compared to baseline (UP IN CHAIR FOR HEMODIALYSIS)  Outcome: Completed/Met Date Met:  05/11/14 Walked in hallway several times this shift

## 2014-05-11 NOTE — Progress Notes (Signed)
Patient Demographics  Kimberly Weiss, is a 34 y.o. female, DOB - 1980/11/01, UEA:[id number]  Admit date - 04/26/2014   Admitting Physician Colbert Coyer, MD  Outpatient Primary MD for the patient is LUKING,SCOTT, MD  LOS - 14   Chief Complaint  Patient presents with  . Hypotension      Summary  34 y/o woman admitted 2/26 after syncopal episode. Work up concerning for UGI bleed likely due to NSAID usage. PCCM consulted for ICU admission 2/26 . Found to have large UGIB d/t duodenal ulcer on EGD .Initially required 5 u PRBC , tx w/ PPI stable to [place] on 3/1 . Reconsulted 3/6 for Hypotension/GI bleed. Pt transferred to ICU . IR eval for possible embolization which failed, she was subsequently taken to or and underwent exploratory lap with suturing of bleeding duodenal ulcer. She was stabilized in ICU and transferred under my care on 05/09/2014.   Subjective:   Kimberly Weiss today has, No headache, No chest pain,  No new weakness tingling or numbness, No Cough - SOB.  Sleeping in bed, on Dilaudid PCA, somnolent, aroused, complains of 10 out of 10 pain and then goes back to sleep.  Assessment & Plan    1. Severe upper GI bleed with blood loss treated anemia secondary to duodenal ulcer. Seen by GI, general surgery and critical care. Status post exploratory laparotomy with [place] of duodenal ulcer by general surgery on 05/07/2014. Has received over 5 units of RBCs this admission, continue IV PPI, continue nothing by mouth. Diet advanced per general surgery still has no bowel activity due to high doses of narcotics for days postop. Will try to minimize narcotics. Have counseled the patient.  Will continue to monitor H&H, there is some fall secondary to patient experiencing heavy menstrual  periods are tearing 05/09/2014. We'll transfuse if hemoglobin drops below 7.5.    2. Hypovolemic hemorrhagic shock and hypotension. Currently resolved. Monitor H&H gentle IV fluids as nothing by mouth.     3. Chronic tobacco abuse, alcohol use. Counseled to quit both. Nicoderm patch.     4. Hypothyroidism on IV Synthroid.     5. Chr back pain. Has tremendous narcotic tolerance due home use. On Dilaudid PCA have reduced the dose on 05-11-14 as continues to have no Bowel activity 4 days post op, DC with mental patch. Counseled to minimize narcotic use and increase activity. She does have chronic pain. And is definitely exhibiting narcotic seeking behavior.     6. Mild leukocytosis. Likely reactionary. Stable 2 view chest x-ray, UA borderline monitor.     Patient encouraged to increase activity, encouraged to sit up in the bed, ambulating in the hallway and work with PT. Will provide IS      Code Status: Full  Family Communication: none present  Disposition Plan: Home   Procedures   2/26 Admit with syncope prior to presentation, concern for UGIB. Hgb 8.2 from 15 2/28 No further bleeding, pt seeking pain meds, getting out of bed without assistance 2/28 Massive cratered duodenal bulb ulcer noted on EGD 3/6 Near syncopal episode w/ black stool , sharp drop in hbg and hypotension  3/7 Hg stable, transfer out of the ICU to tele 3/8 Hg unstable, transfer out of tele  to the ICU 05-07-14 - Exploratory laparotomy, oversew of duodenal ulcer   Consults  PCCM, GI, CCS   Medications  Scheduled Meds: . antiseptic oral rinse  7 mL Mouth Rinse BID  . busPIRone  7.5 mg Oral BID  . fluticasone  2 spray Each Nare Daily  . HYDROmorphone PCA 0.3 mg/mL   Intravenous 6 times per day  . levothyroxine  25 mcg Intravenous Daily  . nicotine  14 mg Transdermal Daily  . [START ON 05/12/2014] pantoprazole (PROTONIX) IV  40 mg Intravenous Q12H  . sodium chloride  1 spray Each Nare  BID  . sucralfate  1 g Oral TID WC & HS   Continuous Infusions: . dextrose 5 % and 0.45% NaCl 1,000 mL with potassium chloride 20 mEq infusion    . pantoprozole (PROTONIX) infusion 8 mg/hr (05/11/14 0439)   PRN Meds:.acetaminophen, albuterol, diphenhydrAMINE **OR** diphenhydrAMINE, LORazepam, methocarbamol (ROBAXIN)  IV, ondansetron (ZOFRAN) IV, phenol, sodium chloride  DVT Prophylaxis   SCDs    Lab Results  Component Value Date   PLT 279 05/11/2014    Antibiotics     Anti-infectives    Start     Dose/Rate Route Frequency Ordered Stop   05/07/14 2100  [MAR Hold]  ceFAZolin (ANCEF) IVPB 2 g/50 mL premix  Status:  Discontinued     (MAR Hold since 05/07/14 1951)   2 g 100 mL/hr over 30 Minutes Intravenous  Once 05/07/14 1924 05/09/14 0720          Objective:   Filed Vitals:   05/11/14 0143 05/11/14 0423 05/11/14 0500 05/11/14 0600  BP: 97/45   96/53  Pulse: 117   102  Temp: 97.7 F (36.5 C)   98.2 F (36.8 C)  TempSrc: Oral   Oral  Resp: '9 16  15  ' Height:      Weight:   77.474 kg (170 lb 12.8 oz)   SpO2: 97% 98%  98%    Wt Readings from Last 3 Encounters:  05/11/14 77.474 kg (170 lb 12.8 oz)  02/04/14 68.493 kg (151 lb)  01/04/14 68.493 kg (151 lb)     Intake/Output Summary (Last 24 hours) at 05/11/14 0919 Last data filed at 05/11/14 0600  Gross per 24 hour  Intake   1649 ml  Output   1620 ml  Net     29 ml     Physical Exam  Somnolent, easily arousable, answers questions and follows commands, No new F.N deficits, Normal affect [name].AT,PERRAL Supple Neck,No JVD, No cervical lymphadenopathy appriciated.  Symmetrical Chest wall movement, Good air movement bilaterally, CTAB RRR,No Gallops,Rubs or new Murmurs, No Parasternal Heave +ve B.Sounds, Abd Soft, No tenderness, No organomegaly appriciated, No rebound - guarding or rigidity.  Abdominal incision site under bandage with JP drain in place.   No Cyanosis, Clubbing or edema, No new Rash or bruise       Data Review   Micro Results Recent Results (from the past 240 hour(s))  Urine culture     Status: None   Collection Time: 05/09/14 11:45 AM  Result Value Ref Range Status   Specimen Description URINE, CLEAN CATCH  Final   Special Requests NONE  Final   Colony Count NO GROWTH Performed at [facility]   Final   Culture NO GROWTH Performed at [facility]   Final   Report Status 05/10/2014 FINAL  Final    Radiology Reports Dg Chest 2 View  05/09/2014   CLINICAL DATA:  Cough and  postnasal drip for a few days.  Weakness.  EXAM: CHEST  2 VIEW  COMPARISON:  None.  FINDINGS: A right PICC is present and terminates near the cavoatrial junction. An enteric tube loops in the upper abdomen over the proximal stomach with side hole well beyond the GE junction and tip not imaged. There is slight elevation of the right hemidiaphragm. Cardiomediastinal silhouette is within normal limits. The lungs are slightly hypoinflated without evidence of airspace consolidation, edema, pleural effusion, or pneumothorax. Upper abdominal skin staples and a likely surgical drain are noted. No acute osseous abnormality is identified.  IMPRESSION: No active cardiopulmonary disease.   Electronically Signed   By: Logan Bores   On: 05/09/2014 14:59   Ct Head Wo Contrast  04/29/2014   CLINICAL DATA:  Fall. Hit left-sided head on dry air. Headaches. Neck pain.  EXAM: CT HEAD WITHOUT CONTRAST  CT CERVICAL SPINE WITHOUT CONTRAST  TECHNIQUE: Multidetector CT imaging of the head and cervical spine was performed following the standard protocol without intravenous contrast. Multiplanar CT image reconstructions of the cervical spine were also generated.  COMPARISON:  None.  FINDINGS: CT HEAD FINDINGS  No acute cortical infarct, hemorrhage, or mass lesion ispresent. Ventricles are of normal size. No significant extra-axial fluid collection is present. The paranasal sinuses andmastoid air cells are clear. The osseous  skull is intact.  CT CERVICAL SPINE FINDINGS  There is reversal of normal cervical lordosis which may reflect muscle spasm or patient positioning. The vertebral body heights are well preserved. The facet joints are all well aligned. The prevertebral soft tissue space is normal.  IMPRESSION: 1. No acute intracranial abnormalities. 2. Reversal of normal cervical lordosis which may reflect muscle spasm or patient positioning.   Electronically Signed   By: Kerby Moors M.D.   On: 04/29/2014 16:00   Ct Cervical Spine Wo Contrast  04/29/2014   CLINICAL DATA:  Fall. Hit left-sided head on dry air. Headaches. Neck pain.  EXAM: CT HEAD WITHOUT CONTRAST  CT CERVICAL SPINE WITHOUT CONTRAST  TECHNIQUE: Multidetector CT imaging of the head and cervical spine was performed following the standard protocol without intravenous contrast. Multiplanar CT image reconstructions of the cervical spine were also generated.  COMPARISON:  None.  FINDINGS: CT HEAD FINDINGS  No acute cortical infarct, hemorrhage, or mass lesion ispresent. Ventricles are of normal size. No significant extra-axial fluid collection is present. The paranasal sinuses andmastoid air cells are clear. The osseous skull is intact.  CT CERVICAL SPINE FINDINGS  There is reversal of normal cervical lordosis which may reflect muscle spasm or patient positioning. The vertebral body heights are well preserved. The facet joints are all well aligned. The prevertebral soft tissue space is normal.  IMPRESSION: 1. No acute intracranial abnormalities. 2. Reversal of normal cervical lordosis which may reflect muscle spasm or patient positioning.   Electronically Signed   By: Kerby Moors M.D.   On: 04/29/2014 16:00   Ir Angiogram Visceral Selective  05/05/2014   INDICATION: 34 year old female admitted to the hospital with upper GI bleeding and hematemesis.  She was admitted April 26, 2013. Upper endoscopy demonstrates duodenum ulcer.  She has been stable over 1 week,  though developed syncopal episode and acute anemia with a hemoglobin of 5.7 on today's date. She has been referred for evaluation and treatment of the duodenum ulcer hemorrhage.  EXAM: 1. AORTIC ANGIOGRAM 2. SUPERIOR MESENTERIC ARTERIOGRAM 3. SUB SELECTION OF FOUR TERTIARY BRANCHES FROM THE SUPERIOR MESENTERIC ARTERY 4. ULTRASOUND GUIDED VASCULAR ACCESS  OF THE RIGHT COMMON FEMORAL ARTERY 5. MANUAL PRESSURE FOR HEMOSTASIS.  COMPARISON:  CT abdomen pelvis, 04/27/2013, 09/22/2001  MEDICATIONS: Versed 2.0 mg IV; Fentanyl 100 mcg IV  CONTRAST:  129m OMNIPAQUE IOHEXOL 300 MG/ML  SOLN  ANESTHESIA/SEDATION: One hundred fifty minutes  FLUOROSCOPY TIME:  Forty-one minutes.  Fifty-four seconds.  2941 mGy  ACCESS: Right common femoral artery; hemostasis achieved with manual compression.  COMPLICATIONS: None immediate  TECHNIQUE: Informed written consent was obtained from the patient after a discussion of the risks, benefits and alternatives to treatment. Questions regarding the procedure were encouraged and answered. A timeout was performed prior to the initiation of the procedure.  The right groin was prepped and draped in the usual sterile fashion, and a sterile drape was applied covering the operative field. Maximum barrier sterile technique with sterile gowns and gloves were used for the procedure. A timeout was performed prior to the initiation of the procedure. Local anesthesia was provided with 1% lidocaine.  Physical exam was performed to identify the location of the right common femoral artery. Ultrasound survey was performed with images stored sent to PACs.  Once the common femoral artery was identified under ultrasound, ultrasound guidance was used to generously infiltrate the skin and subcutaneous tissues with 1% lidocaine without epinephrine. A micropuncture access was then used to puncture the right common femoral artery. With excellent blood flow returned, and micro wire was advanced through the needle observed  under fluoroscopy to enter the iliac system. The needle was removed from wire a micropuncture kit was advanced over the wire. The inner dilator and cannula were removed with a wire, and an 035 Bentson wire was advanced into the abdominal aorta under fluoroscopy the micro puncture was removed, and a 5 French sheath was advanced over the Bentson wire into the common femoral artery. The dilator was removed and the sheath was flushed.  A pigtail Flush catheter was advanced over the Bentson wire into the lower thoracic aorta. Aortic flush angiogram performed.  Pigtail catheter was then exchanged over wire for C2 Cobra catheter which was used to select the superior mesenteric artery. Superior mesenteric artery angiogram was performed.  Subsequently, a combination of the micro catheter a micro wire were used to select various branches of superior mesenteric artery in this patient with a celiacomesenteric trunk. A standard renegade micro catheter as well as a soft synchro an a standard .016 synchro wire were used to select branches of the superior mesenteric artery. Sub selection included:  Replaced common hepatic artery.  Right hepatic artery.  New middle colicky artery.  Proximal jejunal branches.  Two base catheter exchanges were made on the Bentson wire including exchange for Mickelson catheter and exchanged for a C2 Cobra catheter for formation of a Waltman loop.  After dedicated angiogram of each of the sub selected arteries, all catheters and wires were removed.  Angiogram through the sheath was performed with the common femoral artery.  Deployment of an Exoseal device for hemostasis failed without deployment. Manual pressure was used for hemostasis of the right common femoral artery.  Patient tolerated procedure well and remained hemodynamically stable throughout. At the end of the procedure, the patient's vital signs were within normal range. Systolic brought pressure 110. Heart rate of 80-90. No syncopal events,  no altered mental status, no hematemesis, and no hematochezia/melena.  .Marland Kitchen FINDINGS: Aortic angiogram:  Normal course caliber and contour of the abdominal aorta. Multiple segmental vessels fill within the lower thoracic spine and lumbar spine. No aneurysm  dissection flap identified. No significant atherosclerotic changes.  Steep angled take-off of the bilateral common iliac arteries.  Single bilateral renal arteries.  No celiac artery origin.  Superior mesenteric artery patent, with configuration of a celiacomesenteric trunk.  Late phase of the aortic rind demonstrates no evidence of bleeding.  Celiacomesenteric angiogram:  Normal caliber and contour of the origin of the trunk. Multiple jejunal branches present. Normal caliber and contour of the ileocolic artery. Multiple tortuous vessels overlie the proximal 3rd of the trunk, with evidence of a replaced common hepatic artery contributing to both right and left hepatic arteries. There is a replaced vessel representing a celiac vessel, with cross-filling at a bifurcation to the splenic artery and the hepatic arteries. Patent left gastric artery. New  Late phase of the trunk injection demonstrates no evidence of bleeding. No tumor blush or pooling of contrast. Patent portal venous system identified.  Common hepatic artery injection:  Replaced common hepatic artery with right hepatic arteries and left hepatic arteries from the replaced trunk. No evidence of tumor blush, aneurysm, dissection flap, pulling of contrast. No abnormal enhancement identified. No pseudoaneurysm identified.  No call it artery injection:  Patent middle call it artery of normal caliber. Significant cross flow from collateral vessels with no pooling of contrast, pseudoaneurysm, or tumor blush.  Proximal jejunal arcade:  Normal course caliber and contour. Normal blush of the proximal jejunal mucosa. No filling defects, or pseudoaneurysm.  No embolization was performed.  IMPRESSION: Status post  diagnostic mesenteric angiogram.  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  No bleeding was identified from the replaced common hepatic artery, which would be the best candidate for supplying the pancreaticoduodenal arteries near the known proximal duodenum ulcer. Because no bleeding was identified of this variant anatomy, no empiric embolization was performed.  Signed,  Dulcy Fanny. Earleen Newport, DO  [facility]  [facility]  PLAN: The patient will require 6 hours flattened bed with log roll only for hemostasis of the right common femoral artery after manual compression.  Agree with ICU management, including serial hemoglobin and hematocrit checks.   Electronically Signed   By: Corrie Mckusick D.O.   On: 05/05/2014 16:37   Ir Angiogram Selective Each Additional Vessel  05/05/2014   INDICATION: 34 year old female admitted to the hospital with upper GI bleeding and hematemesis.  She was admitted April 26, 2013. Upper endoscopy demonstrates duodenum ulcer.  She has been stable over 1 week, though developed syncopal episode and acute anemia with a hemoglobin of 5.7 on today's date. She has been referred for evaluation and treatment of the duodenum ulcer hemorrhage.  EXAM: 1. AORTIC ANGIOGRAM 2. SUPERIOR MESENTERIC ARTERIOGRAM 3. SUB SELECTION OF FOUR TERTIARY BRANCHES FROM THE SUPERIOR MESENTERIC ARTERY 4. ULTRASOUND GUIDED VASCULAR ACCESS OF THE RIGHT COMMON FEMORAL ARTERY 5. MANUAL PRESSURE FOR HEMOSTASIS.  COMPARISON:  CT abdomen pelvis, 04/27/2013, 09/22/2001  MEDICATIONS: Versed 2.0 mg IV; Fentanyl 100 mcg IV  CONTRAST:  145m OMNIPAQUE IOHEXOL 300 MG/ML  SOLN  ANESTHESIA/SEDATION: One hundred fifty minutes  FLUOROSCOPY TIME:  Forty-one minutes.  Fifty-four seconds.  2941 mGy  ACCESS: Right common femoral artery; hemostasis achieved with manual compression.  COMPLICATIONS: None immediate  TECHNIQUE:  Informed written consent was obtained from the patient after a discussion of the risks, benefits and alternatives to treatment. Questions regarding the procedure were encouraged and answered. A timeout was performed prior to the initiation of the procedure.  The right groin was  prepped and draped in the usual sterile fashion, and a sterile drape was applied covering the operative field. Maximum barrier sterile technique with sterile gowns and gloves were used for the procedure. A timeout was performed prior to the initiation of the procedure. Local anesthesia was provided with 1% lidocaine.  Physical exam was performed to identify the location of the right common femoral artery. Ultrasound survey was performed with images stored sent to PACs.  Once the common femoral artery was identified under ultrasound, ultrasound guidance was used to generously infiltrate the skin and subcutaneous tissues with 1% lidocaine without epinephrine. A micropuncture access was then used to puncture the right common femoral artery. With excellent blood flow returned, and micro wire was advanced through the needle observed under fluoroscopy to enter the iliac system. The needle was removed from wire a micropuncture kit was advanced over the wire. The inner dilator and cannula were removed with a wire, and an 035 Bentson wire was advanced into the abdominal aorta under fluoroscopy the micro puncture was removed, and a 5 French sheath was advanced over the Bentson wire into the common femoral artery. The dilator was removed and the sheath was flushed.  A pigtail Flush catheter was advanced over the Bentson wire into the lower thoracic aorta. Aortic flush angiogram performed.  Pigtail catheter was then exchanged over wire for C2 Cobra catheter which was used to select the superior mesenteric artery. Superior mesenteric artery angiogram was performed.  Subsequently, a combination of the micro catheter a micro wire were used to select various  branches of superior mesenteric artery in this patient with a celiacomesenteric trunk. A standard renegade micro catheter as well as a soft synchro an a standard .016 synchro wire were used to select branches of the superior mesenteric artery. Sub selection included:  Replaced common hepatic artery.  Right hepatic artery.  New middle colicky artery.  Proximal jejunal branches.  Two base catheter exchanges were made on the Bentson wire including exchange for Mickelson catheter and exchanged for a C2 Cobra catheter for formation of a Waltman loop.  After dedicated angiogram of each of the sub selected arteries, all catheters and wires were removed.  Angiogram through the sheath was performed with the common femoral artery.  Deployment of an Exoseal device for hemostasis failed without deployment. Manual pressure was used for hemostasis of the right common femoral artery.  Patient tolerated procedure well and remained hemodynamically stable throughout. At the end of the procedure, the patient's vital signs were within normal range. Systolic brought pressure 110. Heart rate of 80-90. No syncopal events, no altered mental status, no hematemesis, and no hematochezia/melena.  Marland Kitchen  FINDINGS: Aortic angiogram:  Normal course caliber and contour of the abdominal aorta. Multiple segmental vessels fill within the lower thoracic spine and lumbar spine. No aneurysm dissection flap identified. No significant atherosclerotic changes.  Steep angled take-off of the bilateral common iliac arteries.  Single bilateral renal arteries.  No celiac artery origin.  Superior mesenteric artery patent, with configuration of a celiacomesenteric trunk.  Late phase of the aortic rind demonstrates no evidence of bleeding.  Celiacomesenteric angiogram:  Normal caliber and contour of the origin of the trunk. Multiple jejunal branches present. Normal caliber and contour of the ileocolic artery. Multiple tortuous vessels overlie the proximal 3rd of the  trunk, with evidence of a replaced common hepatic artery contributing to both right and left hepatic arteries. There is a replaced vessel representing a celiac vessel, with cross-filling at a bifurcation to  the splenic artery and the hepatic arteries. Patent left gastric artery. New  Late phase of the trunk injection demonstrates no evidence of bleeding. No tumor blush or pooling of contrast. Patent portal venous system identified.  Common hepatic artery injection:  Replaced common hepatic artery with right hepatic arteries and left hepatic arteries from the replaced trunk. No evidence of tumor blush, aneurysm, dissection flap, pulling of contrast. No abnormal enhancement identified. No pseudoaneurysm identified.  No call it artery injection:  Patent middle call it artery of normal caliber. Significant cross flow from collateral vessels with no pooling of contrast, pseudoaneurysm, or tumor blush.  Proximal jejunal arcade:  Normal course caliber and contour. Normal blush of the proximal jejunal mucosa. No filling defects, or pseudoaneurysm.  No embolization was performed.  IMPRESSION: Status post diagnostic mesenteric angiogram.  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  No bleeding was identified from the replaced common hepatic artery, which would be the best candidate for supplying the pancreaticoduodenal arteries near the known proximal duodenum ulcer. Because no bleeding was identified of this variant anatomy, no empiric embolization was performed.  Signed,  Dulcy Fanny. Earleen Newport, DO  [facility]  [facility]  PLAN: The patient will require 6 hours flattened bed with log roll only for hemostasis of the right common femoral artery after manual compression.  Agree with ICU management, including serial hemoglobin and hematocrit checks.   Electronically Signed   By: Corrie Mckusick D.O.   On: 05/05/2014  16:37   Ir Angiogram Selective Each Additional Vessel  05/05/2014   INDICATION: 34 year old female admitted to the hospital with upper GI bleeding and hematemesis.  She was admitted April 26, 2013. Upper endoscopy demonstrates duodenum ulcer.  She has been stable over 1 week, though developed syncopal episode and acute anemia with a hemoglobin of 5.7 on today's date. She has been referred for evaluation and treatment of the duodenum ulcer hemorrhage.  EXAM: 1. AORTIC ANGIOGRAM 2. SUPERIOR MESENTERIC ARTERIOGRAM 3. SUB SELECTION OF FOUR TERTIARY BRANCHES FROM THE SUPERIOR MESENTERIC ARTERY 4. ULTRASOUND GUIDED VASCULAR ACCESS OF THE RIGHT COMMON FEMORAL ARTERY 5. MANUAL PRESSURE FOR HEMOSTASIS.  COMPARISON:  CT abdomen pelvis, 04/27/2013, 09/22/2001  MEDICATIONS: Versed 2.0 mg IV; Fentanyl 100 mcg IV  CONTRAST:  153m OMNIPAQUE IOHEXOL 300 MG/ML  SOLN  ANESTHESIA/SEDATION: One hundred fifty minutes  FLUOROSCOPY TIME:  Forty-one minutes.  Fifty-four seconds.  2941 mGy  ACCESS: Right common femoral artery; hemostasis achieved with manual compression.  COMPLICATIONS: None immediate  TECHNIQUE: Informed written consent was obtained from the patient after a discussion of the risks, benefits and alternatives to treatment. Questions regarding the procedure were encouraged and answered. A timeout was performed prior to the initiation of the procedure.  The right groin was prepped and draped in the usual sterile fashion, and a sterile drape was applied covering the operative field. Maximum barrier sterile technique with sterile gowns and gloves were used for the procedure. A timeout was performed prior to the initiation of the procedure. Local anesthesia was provided with 1% lidocaine.  Physical exam was performed to identify the location of the right common femoral artery. Ultrasound survey was performed with images stored sent to PACs.  Once the common femoral artery was identified under ultrasound, ultrasound guidance  was used to generously infiltrate the skin and subcutaneous tissues with 1% lidocaine without epinephrine. A micropuncture access was then used to puncture the right common femoral artery. With excellent  blood flow returned, and micro wire was advanced through the needle observed under fluoroscopy to enter the iliac system. The needle was removed from wire a micropuncture kit was advanced over the wire. The inner dilator and cannula were removed with a wire, and an 035 Bentson wire was advanced into the abdominal aorta under fluoroscopy the micro puncture was removed, and a 5 French sheath was advanced over the Bentson wire into the common femoral artery. The dilator was removed and the sheath was flushed.  A pigtail Flush catheter was advanced over the Bentson wire into the lower thoracic aorta. Aortic flush angiogram performed.  Pigtail catheter was then exchanged over wire for C2 Cobra catheter which was used to select the superior mesenteric artery. Superior mesenteric artery angiogram was performed.  Subsequently, a combination of the micro catheter a micro wire were used to select various branches of superior mesenteric artery in this patient with a celiacomesenteric trunk. A standard renegade micro catheter as well as a soft synchro an a standard .016 synchro wire were used to select branches of the superior mesenteric artery. Sub selection included:  Replaced common hepatic artery.  Right hepatic artery.  New middle colicky artery.  Proximal jejunal branches.  Two base catheter exchanges were made on the Bentson wire including exchange for Mickelson catheter and exchanged for a C2 Cobra catheter for formation of a Waltman loop.  After dedicated angiogram of each of the sub selected arteries, all catheters and wires were removed.  Angiogram through the sheath was performed with the common femoral artery.  Deployment of an Exoseal device for hemostasis failed without deployment. Manual pressure was used for  hemostasis of the right common femoral artery.  Patient tolerated procedure well and remained hemodynamically stable throughout. At the end of the procedure, the patient's vital signs were within normal range. Systolic brought pressure 110. Heart rate of 80-90. No syncopal events, no altered mental status, no hematemesis, and no hematochezia/melena.  Marland Kitchen  FINDINGS: Aortic angiogram:  Normal course caliber and contour of the abdominal aorta. Multiple segmental vessels fill within the lower thoracic spine and lumbar spine. No aneurysm dissection flap identified. No significant atherosclerotic changes.  Steep angled take-off of the bilateral common iliac arteries.  Single bilateral renal arteries.  No celiac artery origin.  Superior mesenteric artery patent, with configuration of a celiacomesenteric trunk.  Late phase of the aortic rind demonstrates no evidence of bleeding.  Celiacomesenteric angiogram:  Normal caliber and contour of the origin of the trunk. Multiple jejunal branches present. Normal caliber and contour of the ileocolic artery. Multiple tortuous vessels overlie the proximal 3rd of the trunk, with evidence of a replaced common hepatic artery contributing to both right and left hepatic arteries. There is a replaced vessel representing a celiac vessel, with cross-filling at a bifurcation to the splenic artery and the hepatic arteries. Patent left gastric artery. New  Late phase of the trunk injection demonstrates no evidence of bleeding. No tumor blush or pooling of contrast. Patent portal venous system identified.  Common hepatic artery injection:  Replaced common hepatic artery with right hepatic arteries and left hepatic arteries from the replaced trunk. No evidence of tumor blush, aneurysm, dissection flap, pulling of contrast. No abnormal enhancement identified. No pseudoaneurysm identified.  No call it artery injection:  Patent middle call it artery of normal caliber. Significant cross flow from  collateral vessels with no pooling of contrast, pseudoaneurysm, or tumor blush.  Proximal jejunal arcade:  Normal course caliber and contour. Normal  blush of the proximal jejunal mucosa. No filling defects, or pseudoaneurysm.  No embolization was performed.  IMPRESSION: Status post diagnostic mesenteric angiogram.  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  No bleeding was identified from the replaced common hepatic artery, which would be the best candidate for supplying the pancreaticoduodenal arteries near the known proximal duodenum ulcer. Because no bleeding was identified of this variant anatomy, no empiric embolization was performed.  Signed,  Dulcy Fanny. Earleen Newport, DO  [facility]  [facility]  PLAN: The patient will require 6 hours flattened bed with log roll only for hemostasis of the right common femoral artery after manual compression.  Agree with ICU management, including serial hemoglobin and hematocrit checks.   Electronically Signed   By: Corrie Mckusick D.O.   On: 05/05/2014 16:37   Ir Angiogram Selective Each Additional Vessel  05/05/2014   INDICATION: 34 year old female admitted to the hospital with upper GI bleeding and hematemesis.  She was admitted April 26, 2013. Upper endoscopy demonstrates duodenum ulcer.  She has been stable over 1 week, though developed syncopal episode and acute anemia with a hemoglobin of 5.7 on today's date. She has been referred for evaluation and treatment of the duodenum ulcer hemorrhage.  EXAM: 1. AORTIC ANGIOGRAM 2. SUPERIOR MESENTERIC ARTERIOGRAM 3. SUB SELECTION OF FOUR TERTIARY BRANCHES FROM THE SUPERIOR MESENTERIC ARTERY 4. ULTRASOUND GUIDED VASCULAR ACCESS OF THE RIGHT COMMON FEMORAL ARTERY 5. MANUAL PRESSURE FOR HEMOSTASIS.  COMPARISON:  CT abdomen pelvis, 04/27/2013, 09/22/2001  MEDICATIONS: Versed 2.0 mg IV; Fentanyl 100 mcg IV  CONTRAST:   149m OMNIPAQUE IOHEXOL 300 MG/ML  SOLN  ANESTHESIA/SEDATION: One hundred fifty minutes  FLUOROSCOPY TIME:  Forty-one minutes.  Fifty-four seconds.  2941 mGy  ACCESS: Right common femoral artery; hemostasis achieved with manual compression.  COMPLICATIONS: None immediate  TECHNIQUE: Informed written consent was obtained from the patient after a discussion of the risks, benefits and alternatives to treatment. Questions regarding the procedure were encouraged and answered. A timeout was performed prior to the initiation of the procedure.  The right groin was prepped and draped in the usual sterile fashion, and a sterile drape was applied covering the operative field. Maximum barrier sterile technique with sterile gowns and gloves were used for the procedure. A timeout was performed prior to the initiation of the procedure. Local anesthesia was provided with 1% lidocaine.  Physical exam was performed to identify the location of the right common femoral artery. Ultrasound survey was performed with images stored sent to PACs.  Once the common femoral artery was identified under ultrasound, ultrasound guidance was used to generously infiltrate the skin and subcutaneous tissues with 1% lidocaine without epinephrine. A micropuncture access was then used to puncture the right common femoral artery. With excellent blood flow returned, and micro wire was advanced through the needle observed under fluoroscopy to enter the iliac system. The needle was removed from wire a micropuncture kit was advanced over the wire. The inner dilator and cannula were removed with a wire, and an 035 Bentson wire was advanced into the abdominal aorta under fluoroscopy the micro puncture was removed, and a 5 French sheath was advanced over the Bentson wire into the common femoral artery. The dilator was removed and the sheath was flushed.  A pigtail Flush catheter was advanced over the Bentson wire into the lower thoracic aorta. Aortic flush  angiogram performed.  Pigtail catheter was then exchanged over wire for C2 Cobra  catheter which was used to select the superior mesenteric artery. Superior mesenteric artery angiogram was performed.  Subsequently, a combination of the micro catheter a micro wire were used to select various branches of superior mesenteric artery in this patient with a celiacomesenteric trunk. A standard renegade micro catheter as well as a soft synchro an a standard .016 synchro wire were used to select branches of the superior mesenteric artery. Sub selection included:  Replaced common hepatic artery.  Right hepatic artery.  New middle colicky artery.  Proximal jejunal branches.  Two base catheter exchanges were made on the Bentson wire including exchange for Mickelson catheter and exchanged for a C2 Cobra catheter for formation of a Waltman loop.  After dedicated angiogram of each of the sub selected arteries, all catheters and wires were removed.  Angiogram through the sheath was performed with the common femoral artery.  Deployment of an Exoseal device for hemostasis failed without deployment. Manual pressure was used for hemostasis of the right common femoral artery.  Patient tolerated procedure well and remained hemodynamically stable throughout. At the end of the procedure, the patient's vital signs were within normal range. Systolic brought pressure 110. Heart rate of 80-90. No syncopal events, no altered mental status, no hematemesis, and no hematochezia/melena.  Marland Kitchen  FINDINGS: Aortic angiogram:  Normal course caliber and contour of the abdominal aorta. Multiple segmental vessels fill within the lower thoracic spine and lumbar spine. No aneurysm dissection flap identified. No significant atherosclerotic changes.  Steep angled take-off of the bilateral common iliac arteries.  Single bilateral renal arteries.  No celiac artery origin.  Superior mesenteric artery patent, with configuration of a celiacomesenteric trunk.  Late  phase of the aortic rind demonstrates no evidence of bleeding.  Celiacomesenteric angiogram:  Normal caliber and contour of the origin of the trunk. Multiple jejunal branches present. Normal caliber and contour of the ileocolic artery. Multiple tortuous vessels overlie the proximal 3rd of the trunk, with evidence of a replaced common hepatic artery contributing to both right and left hepatic arteries. There is a replaced vessel representing a celiac vessel, with cross-filling at a bifurcation to the splenic artery and the hepatic arteries. Patent left gastric artery. New  Late phase of the trunk injection demonstrates no evidence of bleeding. No tumor blush or pooling of contrast. Patent portal venous system identified.  Common hepatic artery injection:  Replaced common hepatic artery with right hepatic arteries and left hepatic arteries from the replaced trunk. No evidence of tumor blush, aneurysm, dissection flap, pulling of contrast. No abnormal enhancement identified. No pseudoaneurysm identified.  No call it artery injection:  Patent middle call it artery of normal caliber. Significant cross flow from collateral vessels with no pooling of contrast, pseudoaneurysm, or tumor blush.  Proximal jejunal arcade:  Normal course caliber and contour. Normal blush of the proximal jejunal mucosa. No filling defects, or pseudoaneurysm.  No embolization was performed.  IMPRESSION: Status post diagnostic mesenteric angiogram.  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  No bleeding was identified from the replaced common hepatic artery, which would be the best candidate for supplying the pancreaticoduodenal arteries near the known proximal duodenum ulcer. Because no bleeding was identified of this variant anatomy, no empiric embolization was performed.  Signed,  Dulcy Fanny. Earleen Newport, DO  [facility]  [facility]   PLAN: The patient will require 6 hours flattened bed with log roll only for hemostasis of the right common femoral  artery after manual compression.  Agree with ICU management, including serial hemoglobin and hematocrit checks.   Electronically Signed   By: Corrie Mckusick D.O.   On: 05/05/2014 16:37   Ir Angiogram Selective Each Additional Vessel  05/05/2014   INDICATION: 34 year old female admitted to the hospital with upper GI bleeding and hematemesis.  She was admitted April 26, 2013. Upper endoscopy demonstrates duodenum ulcer.  She has been stable over 1 week, though developed syncopal episode and acute anemia with a hemoglobin of 5.7 on today's date. She has been referred for evaluation and treatment of the duodenum ulcer hemorrhage.  EXAM: 1. AORTIC ANGIOGRAM 2. SUPERIOR MESENTERIC ARTERIOGRAM 3. SUB SELECTION OF FOUR TERTIARY BRANCHES FROM THE SUPERIOR MESENTERIC ARTERY 4. ULTRASOUND GUIDED VASCULAR ACCESS OF THE RIGHT COMMON FEMORAL ARTERY 5. MANUAL PRESSURE FOR HEMOSTASIS.  COMPARISON:  CT abdomen pelvis, 04/27/2013, 09/22/2001  MEDICATIONS: Versed 2.0 mg IV; Fentanyl 100 mcg IV  CONTRAST:  111m OMNIPAQUE IOHEXOL 300 MG/ML  SOLN  ANESTHESIA/SEDATION: One hundred fifty minutes  FLUOROSCOPY TIME:  Forty-one minutes.  Fifty-four seconds.  2941 mGy  ACCESS: Right common femoral artery; hemostasis achieved with manual compression.  COMPLICATIONS: None immediate  TECHNIQUE: Informed written consent was obtained from the patient after a discussion of the risks, benefits and alternatives to treatment. Questions regarding the procedure were encouraged and answered. A timeout was performed prior to the initiation of the procedure.  The right groin was prepped and draped in the usual sterile fashion, and a sterile drape was applied covering the operative field. Maximum barrier sterile technique with sterile gowns and gloves were used for the procedure. A timeout was performed prior to the initiation of the  procedure. Local anesthesia was provided with 1% lidocaine.  Physical exam was performed to identify the location of the right common femoral artery. Ultrasound survey was performed with images stored sent to PACs.  Once the common femoral artery was identified under ultrasound, ultrasound guidance was used to generously infiltrate the skin and subcutaneous tissues with 1% lidocaine without epinephrine. A micropuncture access was then used to puncture the right common femoral artery. With excellent blood flow returned, and micro wire was advanced through the needle observed under fluoroscopy to enter the iliac system. The needle was removed from wire a micropuncture kit was advanced over the wire. The inner dilator and cannula were removed with a wire, and an 035 Bentson wire was advanced into the abdominal aorta under fluoroscopy the micro puncture was removed, and a 5 French sheath was advanced over the Bentson wire into the common femoral artery. The dilator was removed and the sheath was flushed.  A pigtail Flush catheter was advanced over the Bentson wire into the lower thoracic aorta. Aortic flush angiogram performed.  Pigtail catheter was then exchanged over wire for C2 Cobra catheter which was used to select the superior mesenteric artery. Superior mesenteric artery angiogram was performed.  Subsequently, a combination of the micro catheter a micro wire were used to select various branches of superior mesenteric artery in this patient with a celiacomesenteric trunk. A standard renegade micro catheter as well as a soft synchro an a standard .016 synchro wire were used to select branches of the superior mesenteric artery. Sub selection included:  Replaced common hepatic artery.  Right hepatic artery.  New middle colicky artery.  Proximal jejunal branches.  Two base catheter exchanges were made on the Bentson wire including exchange for Mickelson catheter and exchanged for a C2 Cobra catheter for formation of  a  Waltman loop.  After dedicated angiogram of each of the sub selected arteries, all catheters and wires were removed.  Angiogram through the sheath was performed with the common femoral artery.  Deployment of an Exoseal device for hemostasis failed without deployment. Manual pressure was used for hemostasis of the right common femoral artery.  Patient tolerated procedure well and remained hemodynamically stable throughout. At the end of the procedure, the patient's vital signs were within normal range. Systolic brought pressure 110. Heart rate of 80-90. No syncopal events, no altered mental status, no hematemesis, and no hematochezia/melena.  Marland Kitchen  FINDINGS: Aortic angiogram:  Normal course caliber and contour of the abdominal aorta. Multiple segmental vessels fill within the lower thoracic spine and lumbar spine. No aneurysm dissection flap identified. No significant atherosclerotic changes.  Steep angled take-off of the bilateral common iliac arteries.  Single bilateral renal arteries.  No celiac artery origin.  Superior mesenteric artery patent, with configuration of a celiacomesenteric trunk.  Late phase of the aortic rind demonstrates no evidence of bleeding.  Celiacomesenteric angiogram:  Normal caliber and contour of the origin of the trunk. Multiple jejunal branches present. Normal caliber and contour of the ileocolic artery. Multiple tortuous vessels overlie the proximal 3rd of the trunk, with evidence of a replaced common hepatic artery contributing to both right and left hepatic arteries. There is a replaced vessel representing a celiac vessel, with cross-filling at a bifurcation to the splenic artery and the hepatic arteries. Patent left gastric artery. New  Late phase of the trunk injection demonstrates no evidence of bleeding. No tumor blush or pooling of contrast. Patent portal venous system identified.  Common hepatic artery injection:  Replaced common hepatic artery with right hepatic arteries and left  hepatic arteries from the replaced trunk. No evidence of tumor blush, aneurysm, dissection flap, pulling of contrast. No abnormal enhancement identified. No pseudoaneurysm identified.  No call it artery injection:  Patent middle call it artery of normal caliber. Significant cross flow from collateral vessels with no pooling of contrast, pseudoaneurysm, or tumor blush.  Proximal jejunal arcade:  Normal course caliber and contour. Normal blush of the proximal jejunal mucosa. No filling defects, or pseudoaneurysm.  No embolization was performed.  IMPRESSION: Status post diagnostic mesenteric angiogram.  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  No bleeding was identified from the replaced common hepatic artery, which would be the best candidate for supplying the pancreaticoduodenal arteries near the known proximal duodenum ulcer. Because no bleeding was identified of this variant anatomy, no empiric embolization was performed.  Signed,  Dulcy Fanny. Earleen Newport, DO  [facility]  [facility]  PLAN: The patient will require 6 hours flattened bed with log roll only for hemostasis of the right common femoral artery after manual compression.  Agree with ICU management, including serial hemoglobin and hematocrit checks.   Electronically Signed   By: Corrie Mckusick D.O.   On: 05/05/2014 16:37   Ir US Guide Vasc Access Right  05/05/2014   INDICATION: 34 year old female admitted to the hospital with upper GI bleeding and hematemesis.  She was admitted April 26, 2013. Upper endoscopy demonstrates duodenum ulcer.  She has been stable over 1 week, though developed syncopal episode and acute anemia with a hemoglobin of 5.7 on today's date. She has been referred for evaluation and treatment of the duodenum ulcer hemorrhage.  EXAM: 1. AORTIC ANGIOGRAM 2. SUPERIOR MESENTERIC ARTERIOGRAM 3. SUB SELECTION OF FOUR  TERTIARY  BRANCHES FROM THE SUPERIOR MESENTERIC ARTERY 4. ULTRASOUND GUIDED VASCULAR ACCESS OF THE RIGHT COMMON FEMORAL ARTERY 5. MANUAL PRESSURE FOR HEMOSTASIS.  COMPARISON:  CT abdomen pelvis, 04/27/2013, 09/22/2001  MEDICATIONS: Versed 2.0 mg IV; Fentanyl 100 mcg IV  CONTRAST:  171m OMNIPAQUE IOHEXOL 300 MG/ML  SOLN  ANESTHESIA/SEDATION: One hundred fifty minutes  FLUOROSCOPY TIME:  Forty-one minutes.  Fifty-four seconds.  2941 mGy  ACCESS: Right common femoral artery; hemostasis achieved with manual compression.  COMPLICATIONS: None immediate  TECHNIQUE: Informed written consent was obtained from the patient after a discussion of the risks, benefits and alternatives to treatment. Questions regarding the procedure were encouraged and answered. A timeout was performed prior to the initiation of the procedure.  The right groin was prepped and draped in the usual sterile fashion, and a sterile drape was applied covering the operative field. Maximum barrier sterile technique with sterile gowns and gloves were used for the procedure. A timeout was performed prior to the initiation of the procedure. Local anesthesia was provided with 1% lidocaine.  Physical exam was performed to identify the location of the right common femoral artery. Ultrasound survey was performed with images stored sent to PACs.  Once the common femoral artery was identified under ultrasound, ultrasound guidance was used to generously infiltrate the skin and subcutaneous tissues with 1% lidocaine without epinephrine. A micropuncture access was then used to puncture the right common femoral artery. With excellent blood flow returned, and micro wire was advanced through the needle observed under fluoroscopy to enter the iliac system. The needle was removed from wire a micropuncture kit was advanced over the wire. The inner dilator and cannula were removed with a wire, and an 035 Bentson wire was advanced into the abdominal aorta under fluoroscopy the micro  puncture was removed, and a 5 French sheath was advanced over the Bentson wire into the common femoral artery. The dilator was removed and the sheath was flushed.  A pigtail Flush catheter was advanced over the Bentson wire into the lower thoracic aorta. Aortic flush angiogram performed.  Pigtail catheter was then exchanged over wire for C2 Cobra catheter which was used to select the superior mesenteric artery. Superior mesenteric artery angiogram was performed.  Subsequently, a combination of the micro catheter a micro wire were used to select various branches of superior mesenteric artery in this patient with a celiacomesenteric trunk. A standard renegade micro catheter as well as a soft synchro an a standard .016 synchro wire were used to select branches of the superior mesenteric artery. Sub selection included:  Replaced common hepatic artery.  Right hepatic artery.  New middle colicky artery.  Proximal jejunal branches.  Two base catheter exchanges were made on the Bentson wire including exchange for Mickelson catheter and exchanged for a C2 Cobra catheter for formation of a Waltman loop.  After dedicated angiogram of each of the sub selected arteries, all catheters and wires were removed.  Angiogram through the sheath was performed with the common femoral artery.  Deployment of an Exoseal device for hemostasis failed without deployment. Manual pressure was used for hemostasis of the right common femoral artery.  Patient tolerated procedure well and remained hemodynamically stable throughout. At the end of the procedure, the patient's vital signs were within normal range. Systolic brought pressure 110. Heart rate of 80-90. No syncopal events, no altered mental status, no hematemesis, and no hematochezia/melena.  .Marland Kitchen FINDINGS: Aortic angiogram:  Normal course caliber and contour of the abdominal aorta. Multiple segmental  vessels fill within the lower thoracic spine and lumbar spine. No aneurysm dissection flap  identified. No significant atherosclerotic changes.  Steep angled take-off of the bilateral common iliac arteries.  Single bilateral renal arteries.  No celiac artery origin.  Superior mesenteric artery patent, with configuration of a celiacomesenteric trunk.  Late phase of the aortic rind demonstrates no evidence of bleeding.  Celiacomesenteric angiogram:  Normal caliber and contour of the origin of the trunk. Multiple jejunal branches present. Normal caliber and contour of the ileocolic artery. Multiple tortuous vessels overlie the proximal 3rd of the trunk, with evidence of a replaced common hepatic artery contributing to both right and left hepatic arteries. There is a replaced vessel representing a celiac vessel, with cross-filling at a bifurcation to the splenic artery and the hepatic arteries. Patent left gastric artery. New  Late phase of the trunk injection demonstrates no evidence of bleeding. No tumor blush or pooling of contrast. Patent portal venous system identified.  Common hepatic artery injection:  Replaced common hepatic artery with right hepatic arteries and left hepatic arteries from the replaced trunk. No evidence of tumor blush, aneurysm, dissection flap, pulling of contrast. No abnormal enhancement identified. No pseudoaneurysm identified.  No call it artery injection:  Patent middle call it artery of normal caliber. Significant cross flow from collateral vessels with no pooling of contrast, pseudoaneurysm, or tumor blush.  Proximal jejunal arcade:  Normal course caliber and contour. Normal blush of the proximal jejunal mucosa. No filling defects, or pseudoaneurysm.  No embolization was performed.  IMPRESSION: Status post diagnostic mesenteric angiogram.  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  No bleeding was identified from the replaced common hepatic artery, which would be the best candidate for supplying the  pancreaticoduodenal arteries near the known proximal duodenum ulcer. Because no bleeding was identified of this variant anatomy, no empiric embolization was performed.  Signed,  Dulcy Fanny. Earleen Newport, DO  [facility]  [facility]  PLAN: The patient will require 6 hours flattened bed with log roll only for hemostasis of the right common femoral artery after manual compression.  Agree with ICU management, including serial hemoglobin and hematocrit checks.   Electronically Signed   By: Corrie Mckusick D.O.   On: 05/05/2014 16:37     CBC  Recent Labs Lab 05/07/14 0500  05/08/14 0023  05/09/14 0345  05/09/14 1810 05/10/14 0430 05/10/14 1100 05/10/14 1700 05/11/14 0500  WBC 9.9  --  23.5*  --  14.7*  --   --  11.2*  --   --  8.2  HGB 7.8*  < > 11.7*  < > 9.5*  < > 9.4* 8.1* 8.2* 8.0* 7.8*  HCT 23.1*  < > 34.2*  < > 28.2*  < > 27.6* 24.4* 24.7* 24.3* 23.9*  PLT 217  --  149*  --  211  --   --  215  --   --  279  MCV 86.2  --  84.9  --  87.6  --   --  88.4  --   --  89.2  MCH 29.1  --  29.0  --  29.5  --   --  29.3  --   --  29.1  MCHC 33.8  --  34.2  --  33.7  --   --  33.2  --   --  32.6  RDW 15.3  --  14.7  --  16.1*  --   --  16.0*  --   --  15.5  < > = values in this interval not displayed.  Chemistries   Recent Labs Lab 05/05/14 1800  05/07/14 0500 05/08/14 0023 05/09/14 0310 05/10/14 0430 05/11/14 0500  NA  --   < > 137 136 135 138 138  K 3.6  < > 3.7 4.0 3.5 3.4* 3.5  CL  --   < > 109 112 104 103 106  CO2  --   < > '24 21 28 28 23  ' GLUCOSE  --   < > 102* 152* 135* 109* 109*  BUN  --   < > 14 12 <5* <5* <5*  CREATININE  --   < > 0.53 0.49* 0.61 0.63 0.54  CALCIUM  --   < > 7.7* 7.3* 7.8* 7.9* 8.1*  MG 1.8  --  2.0  --  1.8 1.9  --   < > = values in this interval not displayed. ------------------------------------------------------------------------------------------------------------------ estimated creatinine clearance is 97.6 mL/min  (by C-G formula based on Cr of 0.54). ------------------------------------------------------------------------------------------------------------------ No results for input(s): HGBA1C in the last 72 hours. ------------------------------------------------------------------------------------------------------------------ No results for input(s): CHOL, HDL, LDLCALC, TRIG, CHOLHDL, LDLDIRECT in the last 72 hours. ------------------------------------------------------------------------------------------------------------------ No results for input(s): TSH, T4TOTAL, T3FREE, THYROIDAB in the last 72 hours.  Invalid input(s): FREET3 ------------------------------------------------------------------------------------------------------------------ No results for input(s): VITAMINB12, FOLATE, FERRITIN, TIBC, IRON, RETICCTPCT in the last 72 hours.  Coagulation profile  Recent Labs Lab 05/07/14 2020  INR 1.18    No results for input(s): DDIMER in the last 72 hours.  Cardiac Enzymes No results for input(s): CKMB, TROPONINI, MYOGLOBIN in the last 168 hours.  Invalid input(s): CK ------------------------------------------------------------------------------------------------------------------ Invalid input(s): POCBNP     Time Spent in minutes   45   Lala Lund K M.D on 05/11/2014 at 9:19 AM  Between 7am to 7pm - Pager - [phone]  After 7pm go to [email or web address] - password [facility]  [facility]   Office  [phone]

## 2014-05-11 NOTE — Progress Notes (Signed)
Patient ID: Kimberly Weiss, female   DOB: 12-21-1980, 34 y.o.   MRN: [id number]     [facility]      [address] [place], [place] [address]    Phone: [phone] FAX: [phone]     Subjective: Using less PCA!  No flatus yet.   Burping. No N/V.  VSS.  Afebrile.  Walking in hallways.   Objective:  Vital signs:  Filed Vitals:   05/11/14 0143 05/11/14 0423 05/11/14 0500 05/11/14 0600  BP: 97/45   96/53  Pulse: 117   102  Temp: 97.7 F (36.5 C)   98.2 F (36.8 C)  TempSrc: Oral   Oral  Resp: '9 16  15  ' Height:      Weight:   170 lb 12.8 oz (77.474 kg)   SpO2: 97% 98%  98%    Last BM Date: 05/09/14  Intake/Output   Yesterday:  03/11 0701 - 03/12 0700 In: 7893 [P.O.:360; I.V.:1129; NG/GT:160] Out: 1620 [Urine:650; Emesis/NG output:950; Drains:20] This shift:    I/O last 3 completed shifts: In: 2749 [P.O.:360; I.V.:2229; NG/GT:160] Out: 2310 [Urine:1125; Emesis/NG output:1150; Drains:35]    Physical Exam: General: Pt awake/alert/oriented x4 in no acute distress  Abdomen: Soft.  Nondistended.  JP drain with serosanguinous output.  Mildly tender at incision only.  No evidence of peritonitis.  No incarcerated hernias.   Problem List:   Active Problems:   UGIB (upper gastrointestinal bleed)   Acute blood loss anemia   Head trauma   Tachycardia   Other specified hypotension   Duodenal ulcer   Hypovolemic shock   Tobacco abuse   Other specified hypothyroidism   Anxiety state   GI bleed   Gastrointestinal hemorrhage associated with duodenal ulcer   Duodenal ulcer with hemorrhage   Hemorrhagic shock    Results:   Labs: Results for orders placed or performed during the hospital encounter of 04/26/14 (from the past 48 hour(s))  Hemoglobin and hematocrit, blood     Status: Abnormal   Collection Time: 05/09/14 11:00 AM  Result Value Ref Range   Hemoglobin 8.8 (L) 12.0 - 15.0 g/dL   HCT 26.2 (L) 36.0 - 46.0 %   Urine culture     Status: None   Collection Time: 05/09/14 11:45 AM  Result Value Ref Range   Specimen Description URINE, CLEAN CATCH    Special Requests NONE    Colony Count NO GROWTH Performed at [facility]     Culture NO GROWTH Performed at [facility]     Report Status 05/10/2014 FINAL   Hemoglobin and hematocrit, blood     Status: Abnormal   Collection Time: 05/09/14  6:10 PM  Result Value Ref Range   Hemoglobin 9.4 (L) 12.0 - 15.0 g/dL   HCT 27.6 (L) 36.0 - 46.0 %  CBC     Status: Abnormal   Collection Time: 05/10/14  4:30 AM  Result Value Ref Range   WBC 11.2 (H) 4.0 - 10.5 K/uL   RBC 2.76 (L) 3.87 - 5.11 MIL/uL   Hemoglobin 8.1 (L) 12.0 - 15.0 g/dL   HCT 24.4 (L) 36.0 - 46.0 %   MCV 88.4 78.0 - 100.0 fL   MCH 29.3 26.0 - 34.0 pg   MCHC 33.2 30.0 - 36.0 g/dL   RDW 16.0 (H) 11.5 - 15.5 %   Platelets 215 150 - 400 K/uL  Basic metabolic panel     Status: Abnormal   Collection  Time: 05/10/14  4:30 AM  Result Value Ref Range   Sodium 138 135 - 145 mmol/L   Potassium 3.4 (L) 3.5 - 5.1 mmol/L   Chloride 103 96 - 112 mmol/L   CO2 28 19 - 32 mmol/L   Glucose, Bld 109 (H) 70 - 99 mg/dL   BUN <5 (L) 6 - 23 mg/dL   Creatinine, Ser 0.63 0.50 - 1.10 mg/dL   Calcium 7.9 (L) 8.4 - 10.5 mg/dL   GFR calc non Af Amer >90 >90 mL/min   GFR calc Af Amer >90 >90 mL/min    Comment: (NOTE) The eGFR has been calculated using the CKD EPI equation. This calculation has not been validated in all clinical situations. eGFR's persistently <90 mL/min signify possible Chronic Kidney Disease.    Anion gap 7 5 - 15  Magnesium     Status: None   Collection Time: 05/10/14  4:30 AM  Result Value Ref Range   Magnesium 1.9 1.5 - 2.5 mg/dL  Phosphorus     Status: None   Collection Time: 05/10/14  4:30 AM  Result Value Ref Range   Phosphorus 3.2 2.3 - 4.6 mg/dL  Urinalysis, Routine w reflex microscopic     Status: Abnormal   Collection Time: 05/10/14  7:18 AM  Result Value  Ref Range   Color, Urine YELLOW YELLOW   APPearance CLEAR CLEAR   Specific Gravity, Urine 1.012 1.005 - 1.030   pH 6.0 5.0 - 8.0   Glucose, UA NEGATIVE NEGATIVE mg/dL   Hgb urine dipstick TRACE (A) NEGATIVE   Bilirubin Urine NEGATIVE NEGATIVE   Ketones, ur NEGATIVE NEGATIVE mg/dL   Protein, ur NEGATIVE NEGATIVE mg/dL   Urobilinogen, UA 1.0 0.0 - 1.0 mg/dL   Nitrite NEGATIVE NEGATIVE   Leukocytes, UA TRACE (A) NEGATIVE  Urine microscopic-add on     Status: None   Collection Time: 05/10/14  7:18 AM  Result Value Ref Range   WBC, UA 3-6 <3 WBC/hpf   RBC / HPF 0-2 <3 RBC/hpf  Hemoglobin and hematocrit, blood     Status: Abnormal   Collection Time: 05/10/14 11:00 AM  Result Value Ref Range   Hemoglobin 8.2 (L) 12.0 - 15.0 g/dL   HCT 24.7 (L) 36.0 - 46.0 %  Hemoglobin and hematocrit, blood     Status: Abnormal   Collection Time: 05/10/14  5:00 PM  Result Value Ref Range   Hemoglobin 8.0 (L) 12.0 - 15.0 g/dL   HCT 24.3 (L) 36.0 - 46.0 %  CBC     Status: Abnormal   Collection Time: 05/11/14  5:00 AM  Result Value Ref Range   WBC 8.2 4.0 - 10.5 K/uL   RBC 2.68 (L) 3.87 - 5.11 MIL/uL   Hemoglobin 7.8 (L) 12.0 - 15.0 g/dL   HCT 23.9 (L) 36.0 - 46.0 %   MCV 89.2 78.0 - 100.0 fL   MCH 29.1 26.0 - 34.0 pg   MCHC 32.6 30.0 - 36.0 g/dL   RDW 15.5 11.5 - 15.5 %   Platelets 279 150 - 400 K/uL  Basic metabolic panel     Status: Abnormal   Collection Time: 05/11/14  5:00 AM  Result Value Ref Range   Sodium 138 135 - 145 mmol/L   Potassium 3.5 3.5 - 5.1 mmol/L   Chloride 106 96 - 112 mmol/L   CO2 23 19 - 32 mmol/L   Glucose, Bld 109 (H) 70 - 99 mg/dL   BUN <5 (L) 6 - 23 mg/dL  Creatinine, Ser 0.54 0.50 - 1.10 mg/dL   Calcium 8.1 (L) 8.4 - 10.5 mg/dL   GFR calc non Af Amer >90 >90 mL/min   GFR calc Af Amer >90 >90 mL/min    Comment: (NOTE) The eGFR has been calculated using the CKD EPI equation. This calculation has not been validated in all clinical situations. eGFR's persistently  <90 mL/min signify possible Chronic Kidney Disease.    Anion gap 9 5 - 15    Imaging / Studies: Dg Chest 2 View  05/09/2014   CLINICAL DATA:  Cough and postnasal drip for a few days.  Weakness.  EXAM: CHEST  2 VIEW  COMPARISON:  None.  FINDINGS: A right PICC is present and terminates near the cavoatrial junction. An enteric tube loops in the upper abdomen over the proximal stomach with side hole well beyond the GE junction and tip not imaged. There is slight elevation of the right hemidiaphragm. Cardiomediastinal silhouette is within normal limits. The lungs are slightly hypoinflated without evidence of airspace consolidation, edema, pleural effusion, or pneumothorax. Upper abdominal skin staples and a likely surgical drain are noted. No acute osseous abnormality is identified.  IMPRESSION: No active cardiopulmonary disease.   Electronically Signed   By: Logan Bores   On: 05/09/2014 14:59    Medications / Allergies:  Scheduled Meds: . antiseptic oral rinse  7 mL Mouth Rinse BID  . busPIRone  7.5 mg Oral BID  . fluticasone  2 spray Each Nare Daily  . HYDROmorphone PCA 0.3 mg/mL   Intravenous 6 times per day  . levothyroxine  25 mcg Intravenous Daily  . nicotine  14 mg Transdermal Daily  . [START ON 05/12/2014] pantoprazole (PROTONIX) IV  40 mg Intravenous Q12H  . sodium chloride  1 spray Each Nare BID  . sucralfate  1 g Oral TID WC & HS   Continuous Infusions: . dextrose 5 % and 0.45% NaCl 1,000 mL with potassium chloride 20 mEq infusion    . pantoprozole (PROTONIX) infusion 8 mg/hr (05/11/14 0439)   PRN Meds:.acetaminophen, albuterol, diphenhydrAMINE **OR** diphenhydrAMINE, LORazepam, methocarbamol (ROBAXIN)  IV, ondansetron (ZOFRAN) IV, phenol, sodium chloride  Antibiotics: Anti-infectives    Start     Dose/Rate Route Frequency Ordered Stop   05/07/14 2100  [MAR Hold]  ceFAZolin (ANCEF) IVPB 2 g/50 mL premix  Status:  Discontinued     (MAR Hold since 05/07/14 1951)   2 g 100 mL/hr  over 30 Minutes Intravenous  Once 05/07/14 1924 05/09/14 0720      Assessment/Plan Bleeding duodenal ulcer s/p POD #4 exploratory laparotomy, oversew of duodenal ulcer -Continue dilaudid PCA at full dose, robaxin and fentanyl patch-BP too low to give any more narcotics, she has had a h/o narcotic abuse as well which makes her very challenging to control pain -ice to incision, heat to back -Mobilize to chair/rocking chair and in halls, IS, SCD's -Continue NGT to LWIS until bowel function returns, 950 mostly clear -continue IVF -Await bowel function, before removing NGT -JP drain to remain until day of discharge -Staples to be removed on 05/17/14 -NO PHARM DVT PROPH for now, SCD's Leukocytosis -resolved ABL anemia -on menstrual cycle as well, no active bleeding -monitor CBC periodically  Chronic tobacco use Hypothyroidism Anxiety 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

## 2014-05-12 LAB — CBC
HCT: 24.2 % — ABNORMAL LOW (ref 36.0–46.0)
Hemoglobin: 8 g/dL — ABNORMAL LOW (ref 12.0–15.0)
MCH: 29.6 pg (ref 26.0–34.0)
MCHC: 33.1 g/dL (ref 30.0–36.0)
MCV: 89.6 fL (ref 78.0–100.0)
Platelets: 348 10*3/uL (ref 150–400)
RBC: 2.7 MIL/uL — ABNORMAL LOW (ref 3.87–5.11)
RDW: 14.9 % (ref 11.5–15.5)
WBC: 7.8 10*3/uL (ref 4.0–10.5)

## 2014-05-12 LAB — BASIC METABOLIC PANEL
Anion gap: 4 — ABNORMAL LOW (ref 5–15)
BUN: <5 mg/dL — ABNORMAL LOW (ref 6–23)
CO2: 28 mmol/L (ref 19–32)
Calcium: 8.3 mg/dL — ABNORMAL LOW (ref 8.4–10.5)
Chloride: 108 mmol/L (ref 96–112)
Creatinine, Ser: 0.56 mg/dL (ref 0.50–1.10)
GFR calc Af Amer: >90 mL/min (ref >90)
GFR calc non Af Amer: >90 mL/min (ref >90)
Glucose, Bld: 106 mg/dL — ABNORMAL HIGH (ref 70–99)
Potassium: 3.9 mmol/L (ref 3.5–5.1)
Sodium: 140 mmol/L (ref 135–145)

## 2014-05-12 LAB — URINALYSIS, ROUTINE W REFLEX MICROSCOPIC
Bilirubin Urine: NEGATIVE
Glucose, UA: NEGATIVE mg/dL
Ketones, ur: NEGATIVE mg/dL
Nitrite: NEGATIVE
Protein, ur: NEGATIVE mg/dL
Specific Gravity, Urine: 1.006 (ref 1.005–1.030)
Urobilinogen, UA: 1 mg/dL (ref 0.0–1.0)
pH: 7 (ref 5.0–8.0)

## 2014-05-12 LAB — URINE MICROSCOPIC-ADD ON

## 2014-05-12 MED ORDER — OXYCODONE-ACETAMINOPHEN 5-325 MG PO TABS
1.0000 | ORAL_TABLET | ORAL | Status: DC | PRN
  Administered 2014-05-13 – 2014-05-14 (×6): 2 via ORAL
  Filled 2014-05-12 (×6): qty 2

## 2014-05-12 MED ORDER — HYDROMORPHONE HCL 1 MG/ML IJ SOLN
1.0000 mg | INTRAMUSCULAR | Status: DC | PRN
  Administered 2014-05-12 – 2014-05-13 (×7): 1 mg via INTRAVENOUS
  Filled 2014-05-12 (×7): qty 1

## 2014-05-12 NOTE — Progress Notes (Signed)
Patient Demographics  Kimberly Weiss, is a 33 y.o. female, DOB - Feb 19, 1981, HOZ:[id number]  Admit date - 04/26/2014   Admitting Physician Colbert Coyer, MD  Outpatient Primary MD for the patient is LUKING,SCOTT, MD  LOS - 15   Chief Complaint  Patient presents with  . Hypotension      Summary  34 y/o woman admitted 2/26 after syncopal episode. Work up concerning for UGI bleed likely due to NSAID usage. PCCM consulted for ICU admission 2/26 . Found to have large UGIB d/t duodenal ulcer on EGD .Initially required 5 u PRBC , tx w/ PPI stable to [facility] on 3/1 . Reconsulted 3/6 for Hypotension/GI bleed. Pt transferred to ICU . IR eval for possible embolization which failed, she was subsequently taken to or and underwent exploratory lap with suturing of bleeding duodenal ulcer. She was stabilized in ICU and transferred under my care on 05/09/2014.   Subjective:   Kimberly Weiss today has, No headache, No chest pain,  No new weakness tingling or numbness, No Cough - SOB.  Sleeping in bed, on Dilaudid PCA, somnolent, aroused, complains of 10 out of 10 pain and then goes back to sleep.  Assessment & Plan    1. Severe upper GI bleed with blood loss treated anemia secondary to duodenal ulcer. Seen by GI, general surgery and critical care. Status post exploratory laparotomy with [place] of duodenal ulcer by general surgery on 05/07/2014. Has received over 5 units of RBCs this admission, continue IV PPI, continue nothing by mouth. Diet advanced per general surgery still has no bowel activity due to high doses of narcotics for days postop. Will try to minimize narcotics. Have counseled the patient.  Will continue to monitor H&H, there is some fall secondary to patient experiencing heavy menstrual  periods starting 05/09/2014. We'll transfuse if hemoglobin drops below 7.5.    2. Hypovolemic hemorrhagic shock and hypotension. Currently resolved. Monitor H&H gentle IV fluids as nothing by mouth.     3. Chronic tobacco abuse, alcohol use. Counseled to quit both. Nicoderm patch.     4. Hypothyroidism on IV Synthroid.     5. Chr back pain. Has tremendous narcotic tolerance due home use. On Dilaudid PCA have reduced the dose on 05-11-14 as continues to have no Bowel activity 4 days post op, DC with mental patch. Counseled to minimize narcotic use and increase activity. She does have chronic pain. And is definitely exhibiting narcotic seeking behavior.     6. Mild leukocytosis. Likely reactionary. Stable 2 view chest x-ray, UA stable.     Patient encouraged to increase activity, encouraged to sit up in the bed, ambulating in the hallway and work with PT. Will provide IS      Code Status: Full  Family Communication: none present  Disposition Plan: Home   Procedures   2/26 Admit with syncope prior to presentation, concern for UGIB. Hgb 8.2 from 15 2/28 No further bleeding, pt seeking pain meds, getting out of bed without assistance 2/28 Massive cratered duodenal bulb ulcer noted on EGD 3/6 Near syncopal episode w/ black stool , sharp drop in hbg and hypotension  3/7 Hg stable, transfer out of the ICU to tele 3/8 Hg unstable, transfer out of tele to the  ICU 05-07-14 - Exploratory laparotomy, oversew of duodenal ulcer   Consults  PCCM, GI, CCS   Medications  Scheduled Meds: . antiseptic oral rinse  7 mL Mouth Rinse BID  . busPIRone  7.5 mg Oral BID  . fluticasone  2 spray Each Nare Daily  . HYDROmorphone PCA 0.3 mg/mL   Intravenous 6 times per day  . levothyroxine  25 mcg Intravenous Daily  . nicotine  14 mg Transdermal Daily  . pantoprazole (PROTONIX) IV  40 mg Intravenous Q12H  . sodium chloride  1 spray Each Nare BID  . sucralfate  1 g Oral TID WC &  HS  . venlafaxine XR  150 mg Oral Daily   Continuous Infusions: . dextrose 5 % and 0.45% NaCl 1,000 mL with potassium chloride 20 mEq infusion 100 mL/hr at 05/11/14 1342  . pantoprozole (PROTONIX) infusion 8 mg/hr (05/11/14 0439)   PRN Meds:.acetaminophen, albuterol, diphenhydrAMINE **OR** diphenhydrAMINE, LORazepam, methocarbamol (ROBAXIN)  IV, ondansetron (ZOFRAN) IV, phenol, sodium chloride  DVT Prophylaxis   SCDs    Lab Results  Component Value Date   PLT 348 05/12/2014    Antibiotics     Anti-infectives    Start     Dose/Rate Route Frequency Ordered Stop   05/07/14 2100  [MAR Hold]  ceFAZolin (ANCEF) IVPB 2 g/50 mL premix  Status:  Discontinued     (MAR Hold since 05/07/14 1951)   2 g 100 mL/hr over 30 Minutes Intravenous  Once 05/07/14 1924 05/09/14 0720          Objective:   Filed Vitals:   05/12/14 0142 05/12/14 0331 05/12/14 0642 05/12/14 0907  BP: 100/59  100/55 101/62  Pulse: 117  113 97  Temp: 99.2 F (37.3 C)  98.6 F (37 C) 98.1 F (36.7 C)  TempSrc: Oral  Oral Oral  Resp: _0 Height:      Weight:   78.744 kg (173 lb 9.6 oz)   SpO2: 98% 96% 97% 99%    Wt Readings from Last 3 Encounters:  05/12/14 78.744 kg (173 lb 9.6 oz)  02/04/14 68.493 kg (151 lb)  01/04/14 68.493 kg (151 lb)     Intake/Output Summary (Last 24 hours) at 05/12/14 0924 Last data filed at 05/12/14 0630  Gross per 24 hour  Intake   1400 ml  Output   1800 ml  Net   -400 ml     Physical Exam  Somnolent, easily arousable, answers questions and follows commands, No new F.N deficits, Normal affect [place].AT,PERRAL Supple Neck,No JVD, No cervical lymphadenopathy appriciated.  Symmetrical Chest wall movement, Good air movement bilaterally, CTAB RRR,No Gallops,Rubs or new Murmurs, No Parasternal Heave Hypoactive but +ve B.Sounds, Abd Soft, No tenderness, No organomegaly appriciated, No rebound - guarding or rigidity.  NG in place, Abdominal incision site under bandage  with JP drain in place.   No Cyanosis, Clubbing or edema, No new Rash or bruise      Data Review   Micro Results Recent Results (from the past 240 hour(s))  Urine culture     Status: None   Collection Time: 05/09/14 11:45 AM  Result Value Ref Range Status   Specimen Description URINE, CLEAN CATCH  Final   Special Requests NONE  Final   Colony Count NO GROWTH Performed at [facility]   Final   Culture NO GROWTH Performed at [facility]   Final   Report Status 05/10/2014 FINAL  Final  Urine culture  Status: None   Collection Time: 05/10/14  7:18 AM  Result Value Ref Range Status   Specimen Description URINE, RANDOM  Final   Special Requests NONE  Final   Colony Count NO GROWTH Performed at [facility]   Final   Culture NO GROWTH Performed at [facility]   Final   Report Status 05/11/2014 FINAL  Final    Radiology Reports Dg Chest 2 View  05/09/2014   CLINICAL DATA:  Cough and postnasal drip for a few days.  Weakness.  EXAM: CHEST  2 VIEW  COMPARISON:  None.  FINDINGS: A right PICC is present and terminates near the cavoatrial junction. An enteric tube loops in the upper abdomen over the proximal stomach with side hole well beyond the GE junction and tip not imaged. There is slight elevation of the right hemidiaphragm. Cardiomediastinal silhouette is within normal limits. The lungs are slightly hypoinflated without evidence of airspace consolidation, edema, pleural effusion, or pneumothorax. Upper abdominal skin staples and a likely surgical drain are noted. No acute osseous abnormality is identified.  IMPRESSION: No active cardiopulmonary disease.   Electronically Signed   By: Logan Bores   On: 05/09/2014 14:59   Ct Head Wo Contrast  04/29/2014   CLINICAL DATA:  Fall. Hit left-sided head on dry air. Headaches. Neck pain.  EXAM: CT HEAD WITHOUT CONTRAST  CT CERVICAL SPINE WITHOUT CONTRAST  TECHNIQUE: Multidetector CT imaging of the head and  cervical spine was performed following the standard protocol without intravenous contrast. Multiplanar CT image reconstructions of the cervical spine were also generated.  COMPARISON:  None.  FINDINGS: CT HEAD FINDINGS  No acute cortical infarct, hemorrhage, or mass lesion ispresent. Ventricles are of normal size. No significant extra-axial fluid collection is present. The paranasal sinuses andmastoid air cells are clear. The osseous skull is intact.  CT CERVICAL SPINE FINDINGS  There is reversal of normal cervical lordosis which may reflect muscle spasm or patient positioning. The vertebral body heights are well preserved. The facet joints are all well aligned. The prevertebral soft tissue space is normal.  IMPRESSION: 1. No acute intracranial abnormalities. 2. Reversal of normal cervical lordosis which may reflect muscle spasm or patient positioning.   Electronically Signed   By: Kerby Moors M.D.   On: 04/29/2014 16:00   Ct Cervical Spine Wo Contrast  04/29/2014   CLINICAL DATA:  Fall. Hit left-sided head on dry air. Headaches. Neck pain.  EXAM: CT HEAD WITHOUT CONTRAST  CT CERVICAL SPINE WITHOUT CONTRAST  TECHNIQUE: Multidetector CT imaging of the head and cervical spine was performed following the standard protocol without intravenous contrast. Multiplanar CT image reconstructions of the cervical spine were also generated.  COMPARISON:  None.  FINDINGS: CT HEAD FINDINGS  No acute cortical infarct, hemorrhage, or mass lesion ispresent. Ventricles are of normal size. No significant extra-axial fluid collection is present. The paranasal sinuses andmastoid air cells are clear. The osseous skull is intact.  CT CERVICAL SPINE FINDINGS  There is reversal of normal cervical lordosis which may reflect muscle spasm or patient positioning. The vertebral body heights are well preserved. The facet joints are all well aligned. The prevertebral soft tissue space is normal.  IMPRESSION: 1. No acute intracranial  abnormalities. 2. Reversal of normal cervical lordosis which may reflect muscle spasm or patient positioning.   Electronically Signed   By: Kerby Moors M.D.   On: 04/29/2014 16:00   Ir Angiogram Visceral Selective  05/05/2014   INDICATION: 34 year old female  admitted to the hospital with upper GI bleeding and hematemesis.  She was admitted April 26, 2013. Upper endoscopy demonstrates duodenum ulcer.  She has been stable over 1 week, though developed syncopal episode and acute anemia with a hemoglobin of 5.7 on today's date. She has been referred for evaluation and treatment of the duodenum ulcer hemorrhage.  EXAM: 1. AORTIC ANGIOGRAM 2. SUPERIOR MESENTERIC ARTERIOGRAM 3. SUB SELECTION OF FOUR TERTIARY BRANCHES FROM THE SUPERIOR MESENTERIC ARTERY 4. ULTRASOUND GUIDED VASCULAR ACCESS OF THE RIGHT COMMON FEMORAL ARTERY 5. MANUAL PRESSURE FOR HEMOSTASIS.  COMPARISON:  CT abdomen pelvis, 04/27/2013, 09/22/2001  MEDICATIONS: Versed 2.0 mg IV; Fentanyl 100 mcg IV  CONTRAST:  117m OMNIPAQUE IOHEXOL 300 MG/ML  SOLN  ANESTHESIA/SEDATION: One hundred fifty minutes  FLUOROSCOPY TIME:  Forty-one minutes.  Fifty-four seconds.  2941 mGy  ACCESS: Right common femoral artery; hemostasis achieved with manual compression.  COMPLICATIONS: None immediate  TECHNIQUE: Informed written consent was obtained from the patient after a discussion of the risks, benefits and alternatives to treatment. Questions regarding the procedure were encouraged and answered. A timeout was performed prior to the initiation of the procedure.  The right groin was prepped and draped in the usual sterile fashion, and a sterile drape was applied covering the operative field. Maximum barrier sterile technique with sterile gowns and gloves were used for the procedure. A timeout was performed prior to the initiation of the procedure. Local anesthesia was provided with 1% lidocaine.  Physical exam was performed to identify the location of the right common  femoral artery. Ultrasound survey was performed with images stored sent to PACs.  Once the common femoral artery was identified under ultrasound, ultrasound guidance was used to generously infiltrate the skin and subcutaneous tissues with 1% lidocaine without epinephrine. A micropuncture access was then used to puncture the right common femoral artery. With excellent blood flow returned, and micro wire was advanced through the needle observed under fluoroscopy to enter the iliac system. The needle was removed from wire a micropuncture kit was advanced over the wire. The inner dilator and cannula were removed with a wire, and an 035 Bentson wire was advanced into the abdominal aorta under fluoroscopy the micro puncture was removed, and a 5 French sheath was advanced over the Bentson wire into the common femoral artery. The dilator was removed and the sheath was flushed.  A pigtail Flush catheter was advanced over the Bentson wire into the lower thoracic aorta. Aortic flush angiogram performed.  Pigtail catheter was then exchanged over wire for C2 Cobra catheter which was used to select the superior mesenteric artery. Superior mesenteric artery angiogram was performed.  Subsequently, a combination of the micro catheter a micro wire were used to select various branches of superior mesenteric artery in this patient with a celiacomesenteric trunk. A standard renegade micro catheter as well as a soft synchro an a standard .016 synchro wire were used to select branches of the superior mesenteric artery. Sub selection included:  Replaced common hepatic artery.  Right hepatic artery.  New middle colicky artery.  Proximal jejunal branches.  Two base catheter exchanges were made on the Bentson wire including exchange for Mickelson catheter and exchanged for a C2 Cobra catheter for formation of a Waltman loop.  After dedicated angiogram of each of the sub selected arteries, all catheters and wires were removed.  Angiogram  through the sheath was performed with the common femoral artery.  Deployment of an Exoseal device for hemostasis failed without deployment. Manual pressure  was used for hemostasis of the right common femoral artery.  Patient tolerated procedure well and remained hemodynamically stable throughout. At the end of the procedure, the patient's vital signs were within normal range. Systolic brought pressure 110. Heart rate of 80-90. No syncopal events, no altered mental status, no hematemesis, and no hematochezia/melena.  Marland Kitchen  FINDINGS: Aortic angiogram:  Normal course caliber and contour of the abdominal aorta. Multiple segmental vessels fill within the lower thoracic spine and lumbar spine. No aneurysm dissection flap identified. No significant atherosclerotic changes.  Steep angled take-off of the bilateral common iliac arteries.  Single bilateral renal arteries.  No celiac artery origin.  Superior mesenteric artery patent, with configuration of a celiacomesenteric trunk.  Late phase of the aortic rind demonstrates no evidence of bleeding.  Celiacomesenteric angiogram:  Normal caliber and contour of the origin of the trunk. Multiple jejunal branches present. Normal caliber and contour of the ileocolic artery. Multiple tortuous vessels overlie the proximal 3rd of the trunk, with evidence of a replaced common hepatic artery contributing to both right and left hepatic arteries. There is a replaced vessel representing a celiac vessel, with cross-filling at a bifurcation to the splenic artery and the hepatic arteries. Patent left gastric artery. New  Late phase of the trunk injection demonstrates no evidence of bleeding. No tumor blush or pooling of contrast. Patent portal venous system identified.  Common hepatic artery injection:  Replaced common hepatic artery with right hepatic arteries and left hepatic arteries from the replaced trunk. No evidence of tumor blush, aneurysm, dissection flap, pulling of contrast. No  abnormal enhancement identified. No pseudoaneurysm identified.  No call it artery injection:  Patent middle call it artery of normal caliber. Significant cross flow from collateral vessels with no pooling of contrast, pseudoaneurysm, or tumor blush.  Proximal jejunal arcade:  Normal course caliber and contour. Normal blush of the proximal jejunal mucosa. No filling defects, or pseudoaneurysm.  No embolization was performed.  IMPRESSION: Status post diagnostic mesenteric angiogram.  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  No bleeding was identified from the replaced common hepatic artery, which would be the best candidate for supplying the pancreaticoduodenal arteries near the known proximal duodenum ulcer. Because no bleeding was identified of this variant anatomy, no empiric embolization was performed.  Signed,  Dulcy Fanny. Earleen Newport, DO  [facility]  [facility]  PLAN: The patient will require 6 hours flattened bed with log roll only for hemostasis of the right common femoral artery after manual compression.  Agree with ICU management, including serial hemoglobin and hematocrit checks.   Electronically Signed   By: Corrie Mckusick D.O.   On: 05/05/2014 16:37   Ir Angiogram Selective Each Additional Vessel  05/05/2014   INDICATION: 34 year old female admitted to the hospital with upper GI bleeding and hematemesis.  She was admitted April 26, 2013. Upper endoscopy demonstrates duodenum ulcer.  She has been stable over 1 week, though developed syncopal episode and acute anemia with a hemoglobin of 5.7 on today's date. She has been referred for evaluation and treatment of the duodenum ulcer hemorrhage.  EXAM: 1. AORTIC ANGIOGRAM 2. SUPERIOR MESENTERIC ARTERIOGRAM 3. SUB SELECTION OF FOUR TERTIARY BRANCHES FROM THE SUPERIOR MESENTERIC ARTERY 4. ULTRASOUND GUIDED VASCULAR ACCESS OF THE RIGHT COMMON  FEMORAL ARTERY 5. MANUAL PRESSURE FOR HEMOSTASIS.  COMPARISON:  CT abdomen pelvis, 04/27/2013, 09/22/2001  MEDICATIONS: Versed 2.0 mg IV; Fentanyl 100 mcg IV  CONTRAST:  157m  OMNIPAQUE IOHEXOL 300 MG/ML  SOLN  ANESTHESIA/SEDATION: One hundred fifty minutes  FLUOROSCOPY TIME:  Forty-one minutes.  Fifty-four seconds.  2941 mGy  ACCESS: Right common femoral artery; hemostasis achieved with manual compression.  COMPLICATIONS: None immediate  TECHNIQUE: Informed written consent was obtained from the patient after a discussion of the risks, benefits and alternatives to treatment. Questions regarding the procedure were encouraged and answered. A timeout was performed prior to the initiation of the procedure.  The right groin was prepped and draped in the usual sterile fashion, and a sterile drape was applied covering the operative field. Maximum barrier sterile technique with sterile gowns and gloves were used for the procedure. A timeout was performed prior to the initiation of the procedure. Local anesthesia was provided with 1% lidocaine.  Physical exam was performed to identify the location of the right common femoral artery. Ultrasound survey was performed with images stored sent to PACs.  Once the common femoral artery was identified under ultrasound, ultrasound guidance was used to generously infiltrate the skin and subcutaneous tissues with 1% lidocaine without epinephrine. A micropuncture access was then used to puncture the right common femoral artery. With excellent blood flow returned, and micro wire was advanced through the needle observed under fluoroscopy to enter the iliac system. The needle was removed from wire a micropuncture kit was advanced over the wire. The inner dilator and cannula were removed with a wire, and an 035 Bentson wire was advanced into the abdominal aorta under fluoroscopy the micro puncture was removed, and a 5 French sheath was advanced over the Bentson wire into the common femoral  artery. The dilator was removed and the sheath was flushed.  A pigtail Flush catheter was advanced over the Bentson wire into the lower thoracic aorta. Aortic flush angiogram performed.  Pigtail catheter was then exchanged over wire for C2 Cobra catheter which was used to select the superior mesenteric artery. Superior mesenteric artery angiogram was performed.  Subsequently, a combination of the micro catheter a micro wire were used to select various branches of superior mesenteric artery in this patient with a celiacomesenteric trunk. A standard renegade micro catheter as well as a soft synchro an a standard .016 synchro wire were used to select branches of the superior mesenteric artery. Sub selection included:  Replaced common hepatic artery.  Right hepatic artery.  New middle colicky artery.  Proximal jejunal branches.  Two base catheter exchanges were made on the Bentson wire including exchange for Mickelson catheter and exchanged for a C2 Cobra catheter for formation of a Waltman loop.  After dedicated angiogram of each of the sub selected arteries, all catheters and wires were removed.  Angiogram through the sheath was performed with the common femoral artery.  Deployment of an Exoseal device for hemostasis failed without deployment. Manual pressure was used for hemostasis of the right common femoral artery.  Patient tolerated procedure well and remained hemodynamically stable throughout. At the end of the procedure, the patient's vital signs were within normal range. Systolic brought pressure 110. Heart rate of 80-90. No syncopal events, no altered mental status, no hematemesis, and no hematochezia/melena.  Marland Kitchen  FINDINGS: Aortic angiogram:  Normal course caliber and contour of the abdominal aorta. Multiple segmental vessels fill within the lower thoracic spine and lumbar spine. No aneurysm dissection flap identified. No significant atherosclerotic changes.  Steep angled take-off of the bilateral common iliac  arteries.  Single bilateral renal arteries.  No celiac artery origin.  Superior mesenteric artery patent, with  configuration of a celiacomesenteric trunk.  Late phase of the aortic rind demonstrates no evidence of bleeding.  Celiacomesenteric angiogram:  Normal caliber and contour of the origin of the trunk. Multiple jejunal branches present. Normal caliber and contour of the ileocolic artery. Multiple tortuous vessels overlie the proximal 3rd of the trunk, with evidence of a replaced common hepatic artery contributing to both right and left hepatic arteries. There is a replaced vessel representing a celiac vessel, with cross-filling at a bifurcation to the splenic artery and the hepatic arteries. Patent left gastric artery. New  Late phase of the trunk injection demonstrates no evidence of bleeding. No tumor blush or pooling of contrast. Patent portal venous system identified.  Common hepatic artery injection:  Replaced common hepatic artery with right hepatic arteries and left hepatic arteries from the replaced trunk. No evidence of tumor blush, aneurysm, dissection flap, pulling of contrast. No abnormal enhancement identified. No pseudoaneurysm identified.  No call it artery injection:  Patent middle call it artery of normal caliber. Significant cross flow from collateral vessels with no pooling of contrast, pseudoaneurysm, or tumor blush.  Proximal jejunal arcade:  Normal course caliber and contour. Normal blush of the proximal jejunal mucosa. No filling defects, or pseudoaneurysm.  No embolization was performed.  IMPRESSION: Status post diagnostic mesenteric angiogram.  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  No bleeding was identified from the replaced common hepatic artery, which would be the best candidate for supplying the pancreaticoduodenal arteries near the known proximal duodenum ulcer. Because no bleeding was identified of this  variant anatomy, no empiric embolization was performed.  Signed,  Dulcy Fanny. Earleen Newport, DO  [facility]  [facility]  PLAN: The patient will require 6 hours flattened bed with log roll only for hemostasis of the right common femoral artery after manual compression.  Agree with ICU management, including serial hemoglobin and hematocrit checks.   Electronically Signed   By: Corrie Mckusick D.O.   On: 05/05/2014 16:37   Ir Angiogram Selective Each Additional Vessel  05/05/2014   INDICATION: 34 year old female admitted to the hospital with upper GI bleeding and hematemesis.  She was admitted April 26, 2013. Upper endoscopy demonstrates duodenum ulcer.  She has been stable over 1 week, though developed syncopal episode and acute anemia with a hemoglobin of 5.7 on today's date. She has been referred for evaluation and treatment of the duodenum ulcer hemorrhage.  EXAM: 1. AORTIC ANGIOGRAM 2. SUPERIOR MESENTERIC ARTERIOGRAM 3. SUB SELECTION OF FOUR TERTIARY BRANCHES FROM THE SUPERIOR MESENTERIC ARTERY 4. ULTRASOUND GUIDED VASCULAR ACCESS OF THE RIGHT COMMON FEMORAL ARTERY 5. MANUAL PRESSURE FOR HEMOSTASIS.  COMPARISON:  CT abdomen pelvis, 04/27/2013, 09/22/2001  MEDICATIONS: Versed 2.0 mg IV; Fentanyl 100 mcg IV  CONTRAST:  173m OMNIPAQUE IOHEXOL 300 MG/ML  SOLN  ANESTHESIA/SEDATION: One hundred fifty minutes  FLUOROSCOPY TIME:  Forty-one minutes.  Fifty-four seconds.  2941 mGy  ACCESS: Right common femoral artery; hemostasis achieved with manual compression.  COMPLICATIONS: None immediate  TECHNIQUE: Informed written consent was obtained from the patient after a discussion of the risks, benefits and alternatives to treatment. Questions regarding the procedure were encouraged and answered. A timeout was performed prior to the initiation of the procedure.  The right groin was prepped and draped in the usual sterile fashion, and a sterile drape was applied covering the operative  field. Maximum barrier sterile technique with sterile gowns and gloves were used for the procedure. A  timeout was performed prior to the initiation of the procedure. Local anesthesia was provided with 1% lidocaine.  Physical exam was performed to identify the location of the right common femoral artery. Ultrasound survey was performed with images stored sent to PACs.  Once the common femoral artery was identified under ultrasound, ultrasound guidance was used to generously infiltrate the skin and subcutaneous tissues with 1% lidocaine without epinephrine. A micropuncture access was then used to puncture the right common femoral artery. With excellent blood flow returned, and micro wire was advanced through the needle observed under fluoroscopy to enter the iliac system. The needle was removed from wire a micropuncture kit was advanced over the wire. The inner dilator and cannula were removed with a wire, and an 035 Bentson wire was advanced into the abdominal aorta under fluoroscopy the micro puncture was removed, and a 5 French sheath was advanced over the Bentson wire into the common femoral artery. The dilator was removed and the sheath was flushed.  A pigtail Flush catheter was advanced over the Bentson wire into the lower thoracic aorta. Aortic flush angiogram performed.  Pigtail catheter was then exchanged over wire for C2 Cobra catheter which was used to select the superior mesenteric artery. Superior mesenteric artery angiogram was performed.  Subsequently, a combination of the micro catheter a micro wire were used to select various branches of superior mesenteric artery in this patient with a celiacomesenteric trunk. A standard renegade micro catheter as well as a soft synchro an a standard .016 synchro wire were used to select branches of the superior mesenteric artery. Sub selection included:  Replaced common hepatic artery.  Right hepatic artery.  New middle colicky artery.  Proximal jejunal branches.   Two base catheter exchanges were made on the Bentson wire including exchange for Mickelson catheter and exchanged for a C2 Cobra catheter for formation of a Waltman loop.  After dedicated angiogram of each of the sub selected arteries, all catheters and wires were removed.  Angiogram through the sheath was performed with the common femoral artery.  Deployment of an Exoseal device for hemostasis failed without deployment. Manual pressure was used for hemostasis of the right common femoral artery.  Patient tolerated procedure well and remained hemodynamically stable throughout. At the end of the procedure, the patient's vital signs were within normal range. Systolic brought pressure 110. Heart rate of 80-90. No syncopal events, no altered mental status, no hematemesis, and no hematochezia/melena.  Marland Kitchen  FINDINGS: Aortic angiogram:  Normal course caliber and contour of the abdominal aorta. Multiple segmental vessels fill within the lower thoracic spine and lumbar spine. No aneurysm dissection flap identified. No significant atherosclerotic changes.  Steep angled take-off of the bilateral common iliac arteries.  Single bilateral renal arteries.  No celiac artery origin.  Superior mesenteric artery patent, with configuration of a celiacomesenteric trunk.  Late phase of the aortic rind demonstrates no evidence of bleeding.  Celiacomesenteric angiogram:  Normal caliber and contour of the origin of the trunk. Multiple jejunal branches present. Normal caliber and contour of the ileocolic artery. Multiple tortuous vessels overlie the proximal 3rd of the trunk, with evidence of a replaced common hepatic artery contributing to both right and left hepatic arteries. There is a replaced vessel representing a celiac vessel, with cross-filling at a bifurcation to the splenic artery and the hepatic arteries. Patent left gastric artery. New  Late phase of the trunk injection demonstrates no evidence of bleeding. No tumor blush or pooling  of contrast. Patent portal  venous system identified.  Common hepatic artery injection:  Replaced common hepatic artery with right hepatic arteries and left hepatic arteries from the replaced trunk. No evidence of tumor blush, aneurysm, dissection flap, pulling of contrast. No abnormal enhancement identified. No pseudoaneurysm identified.  No call it artery injection:  Patent middle call it artery of normal caliber. Significant cross flow from collateral vessels with no pooling of contrast, pseudoaneurysm, or tumor blush.  Proximal jejunal arcade:  Normal course caliber and contour. Normal blush of the proximal jejunal mucosa. No filling defects, or pseudoaneurysm.  No embolization was performed.  IMPRESSION: Status post diagnostic mesenteric angiogram.  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  No bleeding was identified from the replaced common hepatic artery, which would be the best candidate for supplying the pancreaticoduodenal arteries near the known proximal duodenum ulcer. Because no bleeding was identified of this variant anatomy, no empiric embolization was performed.  Signed,  Dulcy Fanny. Earleen Newport, DO  [facility]  [facility]  PLAN: The patient will require 6 hours flattened bed with log roll only for hemostasis of the right common femoral artery after manual compression.  Agree with ICU management, including serial hemoglobin and hematocrit checks.   Electronically Signed   By: Corrie Mckusick D.O.   On: 05/05/2014 16:37   Ir Angiogram Selective Each Additional Vessel  05/05/2014   INDICATION: 34 year old female admitted to the hospital with upper GI bleeding and hematemesis.  She was admitted April 26, 2013. Upper endoscopy demonstrates duodenum ulcer.  She has been stable over 1 week, though developed syncopal episode and acute anemia with a hemoglobin of 5.7 on today's date. She has  been referred for evaluation and treatment of the duodenum ulcer hemorrhage.  EXAM: 1. AORTIC ANGIOGRAM 2. SUPERIOR MESENTERIC ARTERIOGRAM 3. SUB SELECTION OF FOUR TERTIARY BRANCHES FROM THE SUPERIOR MESENTERIC ARTERY 4. ULTRASOUND GUIDED VASCULAR ACCESS OF THE RIGHT COMMON FEMORAL ARTERY 5. MANUAL PRESSURE FOR HEMOSTASIS.  COMPARISON:  CT abdomen pelvis, 04/27/2013, 09/22/2001  MEDICATIONS: Versed 2.0 mg IV; Fentanyl 100 mcg IV  CONTRAST:  146m OMNIPAQUE IOHEXOL 300 MG/ML  SOLN  ANESTHESIA/SEDATION: One hundred fifty minutes  FLUOROSCOPY TIME:  Forty-one minutes.  Fifty-four seconds.  2941 mGy  ACCESS: Right common femoral artery; hemostasis achieved with manual compression.  COMPLICATIONS: None immediate  TECHNIQUE: Informed written consent was obtained from the patient after a discussion of the risks, benefits and alternatives to treatment. Questions regarding the procedure were encouraged and answered. A timeout was performed prior to the initiation of the procedure.  The right groin was prepped and draped in the usual sterile fashion, and a sterile drape was applied covering the operative field. Maximum barrier sterile technique with sterile gowns and gloves were used for the procedure. A timeout was performed prior to the initiation of the procedure. Local anesthesia was provided with 1% lidocaine.  Physical exam was performed to identify the location of the right common femoral artery. Ultrasound survey was performed with images stored sent to PACs.  Once the common femoral artery was identified under ultrasound, ultrasound guidance was used to generously infiltrate the skin and subcutaneous tissues with 1% lidocaine without epinephrine. A micropuncture access was then used to puncture the right common femoral artery. With excellent blood flow returned, and micro wire was advanced through the needle observed under fluoroscopy to enter the iliac system. The needle was removed from wire a micropuncture kit was  advanced over the wire.  The inner dilator and cannula were removed with a wire, and an 035 Bentson wire was advanced into the abdominal aorta under fluoroscopy the micro puncture was removed, and a 5 French sheath was advanced over the Bentson wire into the common femoral artery. The dilator was removed and the sheath was flushed.  A pigtail Flush catheter was advanced over the Bentson wire into the lower thoracic aorta. Aortic flush angiogram performed.  Pigtail catheter was then exchanged over wire for C2 Cobra catheter which was used to select the superior mesenteric artery. Superior mesenteric artery angiogram was performed.  Subsequently, a combination of the micro catheter a micro wire were used to select various branches of superior mesenteric artery in this patient with a celiacomesenteric trunk. A standard renegade micro catheter as well as a soft synchro an a standard .016 synchro wire were used to select branches of the superior mesenteric artery. Sub selection included:  Replaced common hepatic artery.  Right hepatic artery.  New middle colicky artery.  Proximal jejunal branches.  Two base catheter exchanges were made on the Bentson wire including exchange for Mickelson catheter and exchanged for a C2 Cobra catheter for formation of a Waltman loop.  After dedicated angiogram of each of the sub selected arteries, all catheters and wires were removed.  Angiogram through the sheath was performed with the common femoral artery.  Deployment of an Exoseal device for hemostasis failed without deployment. Manual pressure was used for hemostasis of the right common femoral artery.  Patient tolerated procedure well and remained hemodynamically stable throughout. At the end of the procedure, the patient's vital signs were within normal range. Systolic brought pressure 110. Heart rate of 80-90. No syncopal events, no altered mental status, no hematemesis, and no hematochezia/melena.  Marland Kitchen  FINDINGS: Aortic angiogram:   Normal course caliber and contour of the abdominal aorta. Multiple segmental vessels fill within the lower thoracic spine and lumbar spine. No aneurysm dissection flap identified. No significant atherosclerotic changes.  Steep angled take-off of the bilateral common iliac arteries.  Single bilateral renal arteries.  No celiac artery origin.  Superior mesenteric artery patent, with configuration of a celiacomesenteric trunk.  Late phase of the aortic rind demonstrates no evidence of bleeding.  Celiacomesenteric angiogram:  Normal caliber and contour of the origin of the trunk. Multiple jejunal branches present. Normal caliber and contour of the ileocolic artery. Multiple tortuous vessels overlie the proximal 3rd of the trunk, with evidence of a replaced common hepatic artery contributing to both right and left hepatic arteries. There is a replaced vessel representing a celiac vessel, with cross-filling at a bifurcation to the splenic artery and the hepatic arteries. Patent left gastric artery. New  Late phase of the trunk injection demonstrates no evidence of bleeding. No tumor blush or pooling of contrast. Patent portal venous system identified.  Common hepatic artery injection:  Replaced common hepatic artery with right hepatic arteries and left hepatic arteries from the replaced trunk. No evidence of tumor blush, aneurysm, dissection flap, pulling of contrast. No abnormal enhancement identified. No pseudoaneurysm identified.  No call it artery injection:  Patent middle call it artery of normal caliber. Significant cross flow from collateral vessels with no pooling of contrast, pseudoaneurysm, or tumor blush.  Proximal jejunal arcade:  Normal course caliber and contour. Normal blush of the proximal jejunal mucosa. No filling defects, or pseudoaneurysm.  No embolization was performed.  IMPRESSION: Status post diagnostic mesenteric angiogram.  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  No bleeding was identified from the replaced common hepatic artery, which would be the best candidate for supplying the pancreaticoduodenal arteries near the known proximal duodenum ulcer. Because no bleeding was identified of this variant anatomy, no empiric embolization was performed.  Signed,  Dulcy Fanny. Earleen Newport, DO  [facility]  [facility]  PLAN: The patient will require 6 hours flattened bed with log roll only for hemostasis of the right common femoral artery after manual compression.  Agree with ICU management, including serial hemoglobin and hematocrit checks.   Electronically Signed   By: Corrie Mckusick D.O.   On: 05/05/2014 16:37   Ir Angiogram Selective Each Additional Vessel  05/05/2014   INDICATION: 34 year old female admitted to the hospital with upper GI bleeding and hematemesis.  She was admitted April 26, 2013. Upper endoscopy demonstrates duodenum ulcer.  She has been stable over 1 week, though developed syncopal episode and acute anemia with a hemoglobin of 5.7 on today's date. She has been referred for evaluation and treatment of the duodenum ulcer hemorrhage.  EXAM: 1. AORTIC ANGIOGRAM 2. SUPERIOR MESENTERIC ARTERIOGRAM 3. SUB SELECTION OF FOUR TERTIARY BRANCHES FROM THE SUPERIOR MESENTERIC ARTERY 4. ULTRASOUND GUIDED VASCULAR ACCESS OF THE RIGHT COMMON FEMORAL ARTERY 5. MANUAL PRESSURE FOR HEMOSTASIS.  COMPARISON:  CT abdomen pelvis, 04/27/2013, 09/22/2001  MEDICATIONS: Versed 2.0 mg IV; Fentanyl 100 mcg IV  CONTRAST:  129m OMNIPAQUE IOHEXOL 300 MG/ML  SOLN  ANESTHESIA/SEDATION: One hundred fifty minutes  FLUOROSCOPY TIME:  Forty-one minutes.  Fifty-four seconds.  2941 mGy  ACCESS: Right common femoral artery; hemostasis achieved with manual compression.  COMPLICATIONS: None immediate  TECHNIQUE: Informed written consent was obtained from the patient after a discussion of the risks, benefits and  alternatives to treatment. Questions regarding the procedure were encouraged and answered. A timeout was performed prior to the initiation of the procedure.  The right groin was prepped and draped in the usual sterile fashion, and a sterile drape was applied covering the operative field. Maximum barrier sterile technique with sterile gowns and gloves were used for the procedure. A timeout was performed prior to the initiation of the procedure. Local anesthesia was provided with 1% lidocaine.  Physical exam was performed to identify the location of the right common femoral artery. Ultrasound survey was performed with images stored sent to PACs.  Once the common femoral artery was identified under ultrasound, ultrasound guidance was used to generously infiltrate the skin and subcutaneous tissues with 1% lidocaine without epinephrine. A micropuncture access was then used to puncture the right common femoral artery. With excellent blood flow returned, and micro wire was advanced through the needle observed under fluoroscopy to enter the iliac system. The needle was removed from wire a micropuncture kit was advanced over the wire. The inner dilator and cannula were removed with a wire, and an 035 Bentson wire was advanced into the abdominal aorta under fluoroscopy the micro puncture was removed, and a 5 French sheath was advanced over the Bentson wire into the common femoral artery. The dilator was removed and the sheath was flushed.  A pigtail Flush catheter was advanced over the Bentson wire into the lower thoracic aorta. Aortic flush angiogram performed.  Pigtail catheter was then exchanged over wire for C2 Cobra catheter which was used to select the superior mesenteric artery. Superior mesenteric artery angiogram was performed.  Subsequently, a combination of the micro catheter a micro wire were used to select various branches of  superior mesenteric artery in this patient with a celiacomesenteric trunk. A standard  renegade micro catheter as well as a soft synchro an a standard .016 synchro wire were used to select branches of the superior mesenteric artery. Sub selection included:  Replaced common hepatic artery.  Right hepatic artery.  New middle colicky artery.  Proximal jejunal branches.  Two base catheter exchanges were made on the Bentson wire including exchange for Mickelson catheter and exchanged for a C2 Cobra catheter for formation of a Waltman loop.  After dedicated angiogram of each of the sub selected arteries, all catheters and wires were removed.  Angiogram through the sheath was performed with the common femoral artery.  Deployment of an Exoseal device for hemostasis failed without deployment. Manual pressure was used for hemostasis of the right common femoral artery.  Patient tolerated procedure well and remained hemodynamically stable throughout. At the end of the procedure, the patient's vital signs were within normal range. Systolic brought pressure 110. Heart rate of 80-90. No syncopal events, no altered mental status, no hematemesis, and no hematochezia/melena.  Marland Kitchen  FINDINGS: Aortic angiogram:  Normal course caliber and contour of the abdominal aorta. Multiple segmental vessels fill within the lower thoracic spine and lumbar spine. No aneurysm dissection flap identified. No significant atherosclerotic changes.  Steep angled take-off of the bilateral common iliac arteries.  Single bilateral renal arteries.  No celiac artery origin.  Superior mesenteric artery patent, with configuration of a celiacomesenteric trunk.  Late phase of the aortic rind demonstrates no evidence of bleeding.  Celiacomesenteric angiogram:  Normal caliber and contour of the origin of the trunk. Multiple jejunal branches present. Normal caliber and contour of the ileocolic artery. Multiple tortuous vessels overlie the proximal 3rd of the trunk, with evidence of a replaced common hepatic artery contributing to both right and left  hepatic arteries. There is a replaced vessel representing a celiac vessel, with cross-filling at a bifurcation to the splenic artery and the hepatic arteries. Patent left gastric artery. New  Late phase of the trunk injection demonstrates no evidence of bleeding. No tumor blush or pooling of contrast. Patent portal venous system identified.  Common hepatic artery injection:  Replaced common hepatic artery with right hepatic arteries and left hepatic arteries from the replaced trunk. No evidence of tumor blush, aneurysm, dissection flap, pulling of contrast. No abnormal enhancement identified. No pseudoaneurysm identified.  No call it artery injection:  Patent middle call it artery of normal caliber. Significant cross flow from collateral vessels with no pooling of contrast, pseudoaneurysm, or tumor blush.  Proximal jejunal arcade:  Normal course caliber and contour. Normal blush of the proximal jejunal mucosa. No filling defects, or pseudoaneurysm.  No embolization was performed.  IMPRESSION: Status post diagnostic mesenteric angiogram.  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  No bleeding was identified from the replaced common hepatic artery, which would be the best candidate for supplying the pancreaticoduodenal arteries near the known proximal duodenum ulcer. Because no bleeding was identified of this variant anatomy, no empiric embolization was performed.  Signed,  Dulcy Fanny. Earleen Newport, DO  [facility]  [facility]  PLAN: The patient will require 6 hours flattened bed with log roll only for hemostasis of the right common femoral artery after manual compression.  Agree with ICU management, including serial hemoglobin and hematocrit checks.   Electronically Signed   By: Corrie Mckusick D.O.   On: 05/05/2014 16:37   Ir  US Guide Vasc Access Right  05/05/2014   INDICATION: 34 year old female admitted  to the hospital with upper GI bleeding and hematemesis.  She was admitted April 26, 2013. Upper endoscopy demonstrates duodenum ulcer.  She has been stable over 1 week, though developed syncopal episode and acute anemia with a hemoglobin of 5.7 on today's date. She has been referred for evaluation and treatment of the duodenum ulcer hemorrhage.  EXAM: 1. AORTIC ANGIOGRAM 2. SUPERIOR MESENTERIC ARTERIOGRAM 3. SUB SELECTION OF FOUR TERTIARY BRANCHES FROM THE SUPERIOR MESENTERIC ARTERY 4. ULTRASOUND GUIDED VASCULAR ACCESS OF THE RIGHT COMMON FEMORAL ARTERY 5. MANUAL PRESSURE FOR HEMOSTASIS.  COMPARISON:  CT abdomen pelvis, 04/27/2013, 09/22/2001  MEDICATIONS: Versed 2.0 mg IV; Fentanyl 100 mcg IV  CONTRAST:  160m OMNIPAQUE IOHEXOL 300 MG/ML  SOLN  ANESTHESIA/SEDATION: One hundred fifty minutes  FLUOROSCOPY TIME:  Forty-one minutes.  Fifty-four seconds.  2941 mGy  ACCESS: Right common femoral artery; hemostasis achieved with manual compression.  COMPLICATIONS: None immediate  TECHNIQUE: Informed written consent was obtained from the patient after a discussion of the risks, benefits and alternatives to treatment. Questions regarding the procedure were encouraged and answered. A timeout was performed prior to the initiation of the procedure.  The right groin was prepped and draped in the usual sterile fashion, and a sterile drape was applied covering the operative field. Maximum barrier sterile technique with sterile gowns and gloves were used for the procedure. A timeout was performed prior to the initiation of the procedure. Local anesthesia was provided with 1% lidocaine.  Physical exam was performed to identify the location of the right common femoral artery. Ultrasound survey was performed with images stored sent to PACs.  Once the common femoral artery was identified under ultrasound, ultrasound guidance was used to generously infiltrate the skin and subcutaneous tissues with 1% lidocaine without epinephrine. A  micropuncture access was then used to puncture the right common femoral artery. With excellent blood flow returned, and micro wire was advanced through the needle observed under fluoroscopy to enter the iliac system. The needle was removed from wire a micropuncture kit was advanced over the wire. The inner dilator and cannula were removed with a wire, and an 035 Bentson wire was advanced into the abdominal aorta under fluoroscopy the micro puncture was removed, and a 5 French sheath was advanced over the Bentson wire into the common femoral artery. The dilator was removed and the sheath was flushed.  A pigtail Flush catheter was advanced over the Bentson wire into the lower thoracic aorta. Aortic flush angiogram performed.  Pigtail catheter was then exchanged over wire for C2 Cobra catheter which was used to select the superior mesenteric artery. Superior mesenteric artery angiogram was performed.  Subsequently, a combination of the micro catheter a micro wire were used to select various branches of superior mesenteric artery in this patient with a celiacomesenteric trunk. A standard renegade micro catheter as well as a soft synchro an a standard .016 synchro wire were used to select branches of the superior mesenteric artery. Sub selection included:  Replaced common hepatic artery.  Right hepatic artery.  New middle colicky artery.  Proximal jejunal branches.  Two base catheter exchanges were made on the Bentson wire including exchange for Mickelson catheter and exchanged for a C2 Cobra catheter for formation of a Waltman loop.  After dedicated angiogram of each of the sub selected arteries, all catheters and wires were removed.  Angiogram through the sheath was performed with the common femoral artery.  Deployment of an Exoseal device for hemostasis failed without deployment. Manual pressure was used for hemostasis of the right common femoral artery.  Patient tolerated procedure well and remained hemodynamically  stable throughout. At the end of the procedure, the patient's vital signs were within normal range. Systolic brought pressure 110. Heart rate of 80-90. No syncopal events, no altered mental status, no hematemesis, and no hematochezia/melena.  Marland Kitchen  FINDINGS: Aortic angiogram:  Normal course caliber and contour of the abdominal aorta. Multiple segmental vessels fill within the lower thoracic spine and lumbar spine. No aneurysm dissection flap identified. No significant atherosclerotic changes.  Steep angled take-off of the bilateral common iliac arteries.  Single bilateral renal arteries.  No celiac artery origin.  Superior mesenteric artery patent, with configuration of a celiacomesenteric trunk.  Late phase of the aortic rind demonstrates no evidence of bleeding.  Celiacomesenteric angiogram:  Normal caliber and contour of the origin of the trunk. Multiple jejunal branches present. Normal caliber and contour of the ileocolic artery. Multiple tortuous vessels overlie the proximal 3rd of the trunk, with evidence of a replaced common hepatic artery contributing to both right and left hepatic arteries. There is a replaced vessel representing a celiac vessel, with cross-filling at a bifurcation to the splenic artery and the hepatic arteries. Patent left gastric artery. New  Late phase of the trunk injection demonstrates no evidence of bleeding. No tumor blush or pooling of contrast. Patent portal venous system identified.  Common hepatic artery injection:  Replaced common hepatic artery with right hepatic arteries and left hepatic arteries from the replaced trunk. No evidence of tumor blush, aneurysm, dissection flap, pulling of contrast. No abnormal enhancement identified. No pseudoaneurysm identified.  No call it artery injection:  Patent middle call it artery of normal caliber. Significant cross flow from collateral vessels with no pooling of contrast, pseudoaneurysm, or tumor blush.  Proximal jejunal arcade:  Normal  course caliber and contour. Normal blush of the proximal jejunal mucosa. No filling defects, or pseudoaneurysm.  No embolization was performed.  IMPRESSION: Status post diagnostic mesenteric angiogram.  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  No bleeding was identified from the replaced common hepatic artery, which would be the best candidate for supplying the pancreaticoduodenal arteries near the known proximal duodenum ulcer. Because no bleeding was identified of this variant anatomy, no empiric embolization was performed.  Signed,  Dulcy Fanny. Earleen Newport, DO  [facility]  [facility]  PLAN: The patient will require 6 hours flattened bed with log roll only for hemostasis of the right common femoral artery after manual compression.  Agree with ICU management, including serial hemoglobin and hematocrit checks.   Electronically Signed   By: Corrie Mckusick D.O.   On: 05/05/2014 16:37     CBC  Recent Labs Lab 05/08/14 0023  05/09/14 0345  05/10/14 0430 05/10/14 1100 05/10/14 1700 05/11/14 0500 05/12/14 0517  WBC 23.5*  --  14.7*  --  11.2*  --   --  8.2 7.8  HGB 11.7*  < > 9.5*  < > 8.1* 8.2* 8.0* 7.8* 8.0*  HCT 34.2*  < > 28.2*  < > 24.4* 24.7* 24.3* 23.9* 24.2*  PLT 149*  --  211  --  215  --   --  279 348  MCV 84.9  --  87.6  --  88.4  --   --  89.2 89.6  MCH 29.0  --  29.5  --  29.3  --   --  29.1 29.6  MCHC 34.2  --  33.7  --  33.2  --   --  32.6 33.1  RDW 14.7  --  16.1*  --  16.0*  --   --  15.5 14.9  < > = values in this interval not displayed.  Chemistries   Recent Labs Lab 05/05/14 1800  05/07/14 0500 05/08/14 0023 05/09/14 0310 05/10/14 0430 05/11/14 0500 05/12/14 0517  NA  --   < > 137 136 135 138 138 140  K 3.6  < > 3.7 4.0 3.5 3.4* 3.5 3.9  CL  --   < > 109 112 104 103 106 108  CO2  --   < > _0 GLUCOSE  --   < > 102* 152* 135* 109* 109*  106*  BUN  --   < > 14 12 <5* <5* <5* <5*  CREATININE  --   < > 0.53 0.49* 0.61 0.63 0.54 0.56  CALCIUM  --   < > 7.7* 7.3* 7.8* 7.9* 8.1* 8.3*  MG 1.8  --  2.0  --  1.8 1.9  --   --   < > = values in this interval not displayed. ------------------------------------------------------------------------------------------------------------------ estimated creatinine clearance is 98.4 mL/min (by C-G formula based on Cr of 0.56). ------------------------------------------------------------------------------------------------------------------ No results for input(s): HGBA1C in the last 72 hours. ------------------------------------------------------------------------------------------------------------------ No results for input(s): CHOL, HDL, LDLCALC, TRIG, CHOLHDL, LDLDIRECT in the last 72 hours. ------------------------------------------------------------------------------------------------------------------ No results for input(s): TSH, T4TOTAL, T3FREE, THYROIDAB in the last 72 hours.  Invalid input(s): FREET3 ------------------------------------------------------------------------------------------------------------------ No results for input(s): VITAMINB12, FOLATE, FERRITIN, TIBC, IRON, RETICCTPCT in the last 72 hours.  Coagulation profile  Recent Labs Lab 05/07/14 2020  INR 1.18    No results for input(s): DDIMER in the last 72 hours.  Cardiac Enzymes No results for input(s): CKMB, TROPONINI, MYOGLOBIN in the last 168 hours.  Invalid input(s): CK ------------------------------------------------------------------------------------------------------------------ Invalid input(s): POCBNP     Time Spent in minutes   45   Lala Lund K M.D on 05/12/2014 at 9:24 AM  Between 7am to 7pm - Pager - [phone]  After 7pm go to [email or web address] - password [facility]  [facility]   Office  [phone]

## 2014-05-12 NOTE — Progress Notes (Signed)
Patient ID: Kimberly Weiss, female   DOB: Dec 06, 1980, 34 y.o.   MRN: [id number]     [facility]      [address] [place], [place] [address]    Phone: [phone] FAX: [phone]     Subjective: Finally passing flatus.  Using very little of PCA.  Wants to keep it until day of DC, I explained to her that before DC she needs to be on PO pain meds.   Objective:  Vital signs:  Filed Vitals:   05/12/14 0142 05/12/14 0331 05/12/14 0642 05/12/14 0907  BP: 100/59  100/55 101/62  Pulse: 117  113 97  Temp: 99.2 F (37.3 C)  98.6 F (37 C) 98.1 F (36.7 C)  TempSrc: Oral  Oral Oral  Resp: '12 15 16 19  ' Height:      Weight:   173 lb 9.6 oz (78.744 kg)   SpO2: 98% 96% 97% 99%    Last BM Date: 05/09/14  Intake/Output   Yesterday:  03/12 0701 - 03/13 0700 In: 1400 [P.O.:300; I.V.:1100] Out: 1800 [Emesis/NG output:1800] This shift:    Physical Exam: General: Pt awake/alert/oriented x4 in no acute distress  Abdomen: Soft. Nondistended. JP drain with serosanguinous output. Mildly tender at incision only.dressing removed, staples in place, edges are approximated no drainage and no erythema.  No evidence of peritonitis. No incarcerated hernias.    Problem List:   Active Problems:   UGIB (upper gastrointestinal bleed)   Acute blood loss anemia   Head trauma   Tachycardia   Other specified hypotension   Duodenal ulcer   Hypovolemic shock   Tobacco abuse   Other specified hypothyroidism   Anxiety state   GI bleed   Gastrointestinal hemorrhage associated with duodenal ulcer   Duodenal ulcer with hemorrhage   Hemorrhagic shock    Results:   Labs: Results for orders placed or performed during the hospital encounter of 04/26/14 (from the past 48 hour(s))  Hemoglobin and hematocrit, blood     Status: Abnormal   Collection Time: 05/10/14 11:00 AM  Result Value Ref Range   Hemoglobin 8.2 (L) 12.0 - 15.0 g/dL   HCT  24.7 (L) 36.0 - 46.0 %  Hemoglobin and hematocrit, blood     Status: Abnormal   Collection Time: 05/10/14  5:00 PM  Result Value Ref Range   Hemoglobin 8.0 (L) 12.0 - 15.0 g/dL   HCT 24.3 (L) 36.0 - 46.0 %  CBC     Status: Abnormal   Collection Time: 05/11/14  5:00 AM  Result Value Ref Range   WBC 8.2 4.0 - 10.5 K/uL   RBC 2.68 (L) 3.87 - 5.11 MIL/uL   Hemoglobin 7.8 (L) 12.0 - 15.0 g/dL   HCT 23.9 (L) 36.0 - 46.0 %   MCV 89.2 78.0 - 100.0 fL   MCH 29.1 26.0 - 34.0 pg   MCHC 32.6 30.0 - 36.0 g/dL   RDW 15.5 11.5 - 15.5 %   Platelets 279 150 - 400 K/uL  Basic metabolic panel     Status: Abnormal   Collection Time: 05/11/14  5:00 AM  Result Value Ref Range   Sodium 138 135 - 145 mmol/L   Potassium 3.5 3.5 - 5.1 mmol/L   Chloride 106 96 - 112 mmol/L   CO2 23 19 - 32 mmol/L   Glucose, Bld 109 (H) 70 - 99 mg/dL   BUN <5 (L) 6 - 23 mg/dL   Creatinine, Ser 0.54  0.50 - 1.10 mg/dL   Calcium 8.1 (L) 8.4 - 10.5 mg/dL   GFR calc non Af Amer >90 >90 mL/min   GFR calc Af Amer >90 >90 mL/min    Comment: (NOTE) The eGFR has been calculated using the CKD EPI equation. This calculation has not been validated in all clinical situations. eGFR's persistently <90 mL/min signify possible Chronic Kidney Disease.    Anion gap 9 5 - 15  CBC     Status: Abnormal   Collection Time: 05/12/14  5:17 AM  Result Value Ref Range   WBC 7.8 4.0 - 10.5 K/uL   RBC 2.70 (L) 3.87 - 5.11 MIL/uL   Hemoglobin 8.0 (L) 12.0 - 15.0 g/dL   HCT 24.2 (L) 36.0 - 46.0 %   MCV 89.6 78.0 - 100.0 fL   MCH 29.6 26.0 - 34.0 pg   MCHC 33.1 30.0 - 36.0 g/dL   RDW 14.9 11.5 - 15.5 %   Platelets 348 150 - 400 K/uL  Basic metabolic panel     Status: Abnormal   Collection Time: 05/12/14  5:17 AM  Result Value Ref Range   Sodium 140 135 - 145 mmol/L   Potassium 3.9 3.5 - 5.1 mmol/L   Chloride 108 96 - 112 mmol/L   CO2 28 19 - 32 mmol/L   Glucose, Bld 106 (H) 70 - 99 mg/dL   BUN <5 (L) 6 - 23 mg/dL   Creatinine, Ser 0.56  0.50 - 1.10 mg/dL   Calcium 8.3 (L) 8.4 - 10.5 mg/dL   GFR calc non Af Amer >90 >90 mL/min   GFR calc Af Amer >90 >90 mL/min    Comment: (NOTE) The eGFR has been calculated using the CKD EPI equation. This calculation has not been validated in all clinical situations. eGFR's persistently <90 mL/min signify possible Chronic Kidney Disease.    Anion gap 4 (L) 5 - 15    Imaging / Studies: No results found.  Medications / Allergies:  Scheduled Meds: . antiseptic oral rinse  7 mL Mouth Rinse BID  . busPIRone  7.5 mg Oral BID  . fluticasone  2 spray Each Nare Daily  . levothyroxine  25 mcg Intravenous Daily  . nicotine  14 mg Transdermal Daily  . pantoprazole (PROTONIX) IV  40 mg Intravenous Q12H  . sodium chloride  1 spray Each Nare BID  . sucralfate  1 g Oral TID WC & HS  . venlafaxine XR  150 mg Oral Daily   Continuous Infusions: . dextrose 5 % and 0.45% NaCl 1,000 mL with potassium chloride 20 mEq infusion 100 mL/hr at 05/11/14 1342  . pantoprozole (PROTONIX) infusion 8 mg/hr (05/11/14 0439)   PRN Meds:.acetaminophen, albuterol, HYDROmorphone (DILAUDID) injection, LORazepam, methocarbamol (ROBAXIN)  IV, ondansetron (ZOFRAN) IV, oxyCODONE-acetaminophen, phenol, sodium chloride  Antibiotics: Anti-infectives    Start     Dose/Rate Route Frequency Ordered Stop   05/07/14 2100  [MAR Hold]  ceFAZolin (ANCEF) IVPB 2 g/50 mL premix  Status:  Discontinued     (MAR Hold since 05/07/14 1951)   2 g 100 mL/hr over 30 Minutes Intravenous  Once 05/07/14 1924 05/09/14 0720        Assessment/Plan Bleeding duodenal ulcer s/p POD #5 exploratory laparotomy, oversew of duodenal ulcer -ice to incision, heat to back -Mobilize to chair/rocking chair and in halls, IS, SCD's -DC NGT and allow clears -continue IVF -JP drain to remain until day of discharge -Staples to be removed on 05/17/14 -NO PHARM DVT PROPH for  now, SCD's -I discussed the criteria for discharge being tolerating food,  having BMs and pain controlled on PO pain meds.  Unfortunately, this patient has a substance abuse problem and is unrealistic of her pain control.  I explained to her in order to progress her, we need to DC PCA.  She may have IVP dilaudid for breakthrough pain, but once again if she wishes to be discharged soon, she cannot be taking IV pain meds. Ive added Percocet 1-2 tabs q4h PRN.  Primary team may adjust if desired. Leukocytosis -resolved ABL anemia -on menstrual cycle as well, no active bleeding -monitor CBC periodically  Chronic tobacco use Hypothyroidism Anxiety Chronic pelvic pain from interstitial cystitis Chronic back pain Chronic narcotic use   Erby Pian, ANP-BC [facility]   05/12/2014 10:43 AM

## 2014-05-12 NOTE — Progress Notes (Signed)
MD, patient is requesting iv Tylenol.  She is concerned that the PO Tylenol is being drawn out by the NG tube and is not helping her headache.

## 2014-05-12 NOTE — Plan of Care (Signed)
Problem: Phase II Progression Outcomes Goal: Tolerating diet Outcome: Progressing Nasogastric tube removed and begun on clear liquid diet today- tolerating well.

## 2014-05-13 LAB — BASIC METABOLIC PANEL
Anion gap: 6 (ref 5–15)
BUN: <5 mg/dL — ABNORMAL LOW (ref 6–23)
CO2: 27 mmol/L (ref 19–32)
Calcium: 8.1 mg/dL — ABNORMAL LOW (ref 8.4–10.5)
Chloride: 106 mmol/L (ref 96–112)
Creatinine, Ser: 0.56 mg/dL (ref 0.50–1.10)
GFR calc Af Amer: >90 mL/min (ref >90)
GFR calc non Af Amer: >90 mL/min (ref >90)
Glucose, Bld: 102 mg/dL — ABNORMAL HIGH (ref 70–99)
Potassium: 3.8 mmol/L (ref 3.5–5.1)
Sodium: 139 mmol/L (ref 135–145)

## 2014-05-13 LAB — CBC
HCT: 23.3 % — ABNORMAL LOW (ref 36.0–46.0)
Hemoglobin: 7.5 g/dL — ABNORMAL LOW (ref 12.0–15.0)
MCH: 29 pg (ref 26.0–34.0)
MCHC: 32.2 g/dL (ref 30.0–36.0)
MCV: 90 fL (ref 78.0–100.0)
Platelets: 368 10*3/uL (ref 150–400)
RBC: 2.59 MIL/uL — ABNORMAL LOW (ref 3.87–5.11)
RDW: 14.9 % (ref 11.5–15.5)
WBC: 5.6 10*3/uL (ref 4.0–10.5)

## 2014-05-13 LAB — MAGNESIUM: Magnesium: 1.9 mg/dL (ref 1.5–2.5)

## 2014-05-13 LAB — PHOSPHORUS: Phosphorus: 2.9 mg/dL (ref 2.3–4.6)

## 2014-05-13 MED ORDER — PANTOPRAZOLE SODIUM 40 MG PO TBEC
40.0000 mg | DELAYED_RELEASE_TABLET | Freq: Two times a day (BID) | ORAL | Status: DC
  Administered 2014-05-13 – 2014-05-14 (×3): 40 mg via ORAL
  Filled 2014-05-13 (×3): qty 1

## 2014-05-13 MED ORDER — HYDROMORPHONE HCL 1 MG/ML IJ SOLN
1.0000 mg | Freq: Four times a day (QID) | INTRAMUSCULAR | Status: DC | PRN
  Administered 2014-05-13 – 2014-05-14 (×3): 1 mg via INTRAVENOUS
  Filled 2014-05-13 (×4): qty 1

## 2014-05-13 MED ORDER — DOCUSATE SODIUM 100 MG PO CAPS
200.0000 mg | ORAL_CAPSULE | Freq: Two times a day (BID) | ORAL | Status: DC
  Administered 2014-05-13 – 2014-05-14 (×3): 200 mg via ORAL
  Filled 2014-05-13 (×3): qty 2

## 2014-05-13 MED ORDER — LEVOTHYROXINE SODIUM 50 MCG PO TABS
50.0000 ug | ORAL_TABLET | Freq: Every day | ORAL | Status: DC
  Administered 2014-05-14: 50 ug via ORAL
  Filled 2014-05-13: qty 1

## 2014-05-13 MED ORDER — FERROUS SULFATE 325 (65 FE) MG PO TABS
325.0000 mg | ORAL_TABLET | Freq: Two times a day (BID) | ORAL | Status: DC
  Administered 2014-05-13 – 2014-05-14 (×3): 325 mg via ORAL
  Filled 2014-05-13 (×3): qty 1

## 2014-05-13 NOTE — Progress Notes (Signed)
Patient ID: Kimberly Weiss, female   DOB: 08/20/1980, 34 y.o.   MRN: [id number]     [facility]      [address] [place], [place] [address]    Phone: [phone] FAX: [phone]     Subjective: Had a BM last night, loose.  Tolerated clears.  VSS.  Afebrile.   Objective:  Vital signs:  Filed Vitals:   05/12/14 0907 05/12/14 1325 05/12/14 2255 05/13/14 0509  BP: 101/62 98/64 100/59 106/63  Pulse: 97 93 94 85  Temp: 98.1 F (36.7 C) 97.5 F (36.4 C) 97.4 F (36.3 C) 98.3 F (36.8 C)  TempSrc: Oral Oral Oral Oral  Resp: _0 Height:      Weight:    170 lb 4.8 oz (77.248 kg)  SpO2: 99% 100% 100% 99%    Last BM Date: 05/12/14  Intake/Output   Yesterday:  03/13 0701 - 03/14 0700 In: 2300 [I.V.:2300] Out: 831 [Urine:800; Drains:30; Stool:1] This shift:    I/O last 3 completed shifts: In: 3400 [I.V.:3400] Out: 64 [Urine:800; Emesis/NG output:700; Drains:30; Stool:1]      Physical Exam: General: Pt awake/alert/oriented x4 in no acute distress  Abdomen: Soft. Nondistended. JP drain with serosanguinous output. Mildly tender at incision only.Staples in place, edges are approximated no drainage and no erythema. No evidence of peritonitis. No incarcerated hernias.   Problem List:   Active Problems:   UGIB (upper gastrointestinal bleed)   Acute blood loss anemia   Head trauma   Tachycardia   Other specified hypotension   Duodenal ulcer   Hypovolemic shock   Tobacco abuse   Other specified hypothyroidism   Anxiety state   GI bleed   Gastrointestinal hemorrhage associated with duodenal ulcer   Duodenal ulcer with hemorrhage   Hemorrhagic shock    Results:   Labs: Results for orders placed or performed during the hospital encounter of 04/26/14 (from the past 48 hour(s))  CBC     Status: Abnormal   Collection Time: 05/12/14  5:17 AM  Result Value Ref Range   WBC 7.8 4.0 - 10.5  K/uL   RBC 2.70 (L) 3.87 - 5.11 MIL/uL   Hemoglobin 8.0 (L) 12.0 - 15.0 g/dL   HCT 24.2 (L) 36.0 - 46.0 %   MCV 89.6 78.0 - 100.0 fL   MCH 29.6 26.0 - 34.0 pg   MCHC 33.1 30.0 - 36.0 g/dL   RDW 14.9 11.5 - 15.5 %   Platelets 348 150 - 400 K/uL  Basic metabolic panel     Status: Abnormal   Collection Time: 05/12/14  5:17 AM  Result Value Ref Range   Sodium 140 135 - 145 mmol/L   Potassium 3.9 3.5 - 5.1 mmol/L   Chloride 108 96 - 112 mmol/L   CO2 28 19 - 32 mmol/L   Glucose, Bld 106 (H) 70 - 99 mg/dL   BUN <5 (L) 6 - 23 mg/dL   Creatinine, Ser 0.56 0.50 - 1.10 mg/dL   Calcium 8.3 (L) 8.4 - 10.5 mg/dL   GFR calc non Af Amer >90 >90 mL/min   GFR calc Af Amer >90 >90 mL/min    Comment: (NOTE) The eGFR has been calculated using the CKD EPI equation. This calculation has not been validated in all clinical situations. eGFR's persistently <90 mL/min signify possible Chronic Kidney Disease.    Anion gap 4 (L) 5 - 15  Urinalysis, Routine w reflex microscopic  Status: Abnormal   Collection Time: 05/12/14 10:40 PM  Result Value Ref Range   Color, Urine YELLOW YELLOW   APPearance CLEAR CLEAR   Specific Gravity, Urine 1.006 1.005 - 1.030   pH 7.0 5.0 - 8.0   Glucose, UA NEGATIVE NEGATIVE mg/dL   Hgb urine dipstick TRACE (A) NEGATIVE   Bilirubin Urine NEGATIVE NEGATIVE   Ketones, ur NEGATIVE NEGATIVE mg/dL   Protein, ur NEGATIVE NEGATIVE mg/dL   Urobilinogen, UA 1.0 0.0 - 1.0 mg/dL   Nitrite NEGATIVE NEGATIVE   Leukocytes, UA TRACE (A) NEGATIVE  Urine microscopic-add on     Status: None   Collection Time: 05/12/14 10:40 PM  Result Value Ref Range   WBC, UA 0-2 <3 WBC/hpf   RBC / HPF 0-2 <3 RBC/hpf  CBC     Status: Abnormal   Collection Time: 05/13/14  5:45 AM  Result Value Ref Range   WBC 5.6 4.0 - 10.5 K/uL   RBC 2.59 (L) 3.87 - 5.11 MIL/uL   Hemoglobin 7.5 (L) 12.0 - 15.0 g/dL   HCT 23.3 (L) 36.0 - 46.0 %   MCV 90.0 78.0 - 100.0 fL   MCH 29.0 26.0 - 34.0 pg   MCHC 32.2  30.0 - 36.0 g/dL   RDW 14.9 11.5 - 15.5 %   Platelets 368 150 - 400 K/uL  Basic metabolic panel     Status: Abnormal   Collection Time: 05/13/14  5:45 AM  Result Value Ref Range   Sodium 139 135 - 145 mmol/L   Potassium 3.8 3.5 - 5.1 mmol/L   Chloride 106 96 - 112 mmol/L   CO2 27 19 - 32 mmol/L   Glucose, Bld 102 (H) 70 - 99 mg/dL   BUN <5 (L) 6 - 23 mg/dL   Creatinine, Ser 0.56 0.50 - 1.10 mg/dL   Calcium 8.1 (L) 8.4 - 10.5 mg/dL   GFR calc non Af Amer >90 >90 mL/min   GFR calc Af Amer >90 >90 mL/min    Comment: (NOTE) The eGFR has been calculated using the CKD EPI equation. This calculation has not been validated in all clinical situations. eGFR's persistently <90 mL/min signify possible Chronic Kidney Disease.    Anion gap 6 5 - 15  Magnesium     Status: None   Collection Time: 05/13/14  5:45 AM  Result Value Ref Range   Magnesium 1.9 1.5 - 2.5 mg/dL  Phosphorus     Status: None   Collection Time: 05/13/14  5:45 AM  Result Value Ref Range   Phosphorus 2.9 2.3 - 4.6 mg/dL    Imaging / Studies: No results found.  Medications / Allergies:  Scheduled Meds: . busPIRone  7.5 mg Oral BID  . ferrous sulfate  325 mg Oral BID WC  . fluticasone  2 spray Each Nare Daily  . levothyroxine  25 mcg Intravenous Daily  . nicotine  14 mg Transdermal Daily  . pantoprazole (PROTONIX) IV  40 mg Intravenous Q12H  . sodium chloride  1 spray Each Nare BID  . sucralfate  1 g Oral TID WC & HS  . venlafaxine XR  150 mg Oral Daily   Continuous Infusions:  PRN Meds:.acetaminophen, albuterol, HYDROmorphone (DILAUDID) injection, LORazepam, methocarbamol (ROBAXIN)  IV, ondansetron (ZOFRAN) IV, oxyCODONE-acetaminophen, phenol, sodium chloride  Antibiotics: Anti-infectives    Start     Dose/Rate Route Frequency Ordered Stop   05/07/14 2100  [MAR Hold]  ceFAZolin (ANCEF) IVPB 2 g/50 mL premix  Status:  Discontinued     (  MAR Hold since 05/07/14 1951)   2 g 100 mL/hr over 30 Minutes Intravenous   Once 05/07/14 1924 05/09/14 0720       Assessment/Plan Bleeding duodenal ulcer s/p POD #6 exploratory laparotomy, oversew of duodenal ulcer -ice to incision, heat to back -Mobilize to chair/rocking chair and in halls, IS, SCD's -FL diet, advance as tolerated -JP drain to remain until day of discharge -Staples to be removed on 05/17/14 -NO PHARM DVT PROPH for now, SCD's -will try to transition her from IV to PO meds.  Once able to tolerate diet and pain controlled with oral meds, may discharge which could be as early as tomorrow pending how she does with percocet Leukocytosis -resolved ABL anemia -on menstrual cycle as well, no active bleeding -monitor CBC periodically  Chronic tobacco use Hypothyroidism Anxiety Chronic pelvic pain from interstitial cystitis Chronic back pain Chronic narcotic use  Erby Pian, ANP-BC [facility] Pager [phone](7A-4:30P) For consults and floor pages call [phone](7A-4:30P)  05/13/2014 7:55 AM

## 2014-05-13 NOTE — Progress Notes (Signed)
Patient Demographics  Kimberly Weiss, is a 34 y.o. female, DOB - May 10, 1980, EXH:[id number]  Admit date - 04/26/2014   Admitting Physician Colbert Coyer, MD  Outpatient Primary MD for the patient is LUKING,SCOTT, MD  LOS - 16   Chief Complaint  Patient presents with  . Hypotension      Summary  34 y/o woman admitted 2/26 after syncopal episode. Work up concerning for UGI bleed likely due to NSAID usage. PCCM consulted for ICU admission 2/26 . Found to have large UGIB d/t duodenal ulcer on EGD .Initially required 5 u PRBC , tx w/ PPI stable to [place] on 3/1 . Reconsulted 3/6 for Hypotension/GI bleed. Pt transferred to ICU . IR eval for possible embolization which failed, she was subsequently taken to or and underwent exploratory lap with suturing of bleeding duodenal ulcer. She was stabilized in ICU and transferred under my care on 05/09/2014.   Subjective:   Alicen Donalson today has, No headache, No chest pain,  No new weakness tingling or numbness, No Cough - SOB.  Sleeping in bed, on Dilaudid PCA, somnolent, aroused, complains of 10 out of 10 pain and then goes back to sleep.  Assessment & Plan    1. Severe upper GI bleed with blood loss treated anemia secondary to duodenal ulcer. Seen by GI, general surgery and critical care. Status post exploratory laparotomy with [place] of duodenal ulcer by general surgery on 05/07/2014. Has received over 5 units of RBCs this admission, continue IV PPI, continue nothing by mouth. Diet advanced per general surgery had BM. Will try to minimize narcotics. Have counseled the patient.  Will continue to monitor H&H, there is some fall secondary to patient experiencing heavy menstrual periods starting 05/09/2014. We'll transfuse if hemoglobin drops below  7.5.    2. Hypovolemic hemorrhagic shock and hypotension. Currently resolved. Monitor H&H gentle IV fluids as nothing by mouth.     3. Chronic tobacco abuse, alcohol use. Counseled to quit both. Nicoderm patch.     4. Hypothyroidism on IV Synthroid.     5. Chr back pain. Has tremendous narcotic tolerance due home use. On Dilaudid PCA have reduced the dose on 05-11-14 as continues to have no Bowel activity 4 days post op, DC with mental patch. Counseled to minimize narcotic use and increase activity. She does have chronic pain. And is definitely exhibiting narcotic seeking behavior.     6. Mild leukocytosis. Likely reactionary. Stable 2 view chest x-ray, UA stable.     Patient encouraged to increase activity, encouraged to sit up in the bed, ambulating in the hallway and work with PT. Will provide IS      Code Status: Full  Family Communication: none present  Disposition Plan: Home   Procedures   2/26 Admit with syncope prior to presentation, concern for UGIB. Hgb 8.2 from 15 2/28 No further bleeding, pt seeking pain meds, getting out of bed without assistance 2/28 Massive cratered duodenal bulb ulcer noted on EGD 3/6 Near syncopal episode w/ black stool , sharp drop in hbg and hypotension  3/7 Hg stable, transfer out of the ICU to tele 3/8 Hg unstable, transfer out of tele to the ICU 05-07-14 - Exploratory laparotomy, oversew of duodenal ulcer   Consults  PCCM, GI, CCS   Medications  Scheduled Meds: . busPIRone  7.5 mg Oral BID  . ferrous sulfate  325 mg Oral BID WC  . fluticasone  2 spray Each Nare Daily  . [START ON 05/14/2014] levothyroxine  50 mcg Oral QAC breakfast  . nicotine  14 mg Transdermal Daily  . pantoprazole  40 mg Oral BID  . sodium chloride  1 spray Each Nare BID  . sucralfate  1 g Oral TID WC & HS  . venlafaxine XR  150 mg Oral Daily   Continuous Infusions:   PRN Meds:.acetaminophen, albuterol, HYDROmorphone (DILAUDID)  injection, LORazepam, methocarbamol (ROBAXIN)  IV, ondansetron (ZOFRAN) IV, oxyCODONE-acetaminophen, phenol, sodium chloride  DVT Prophylaxis   SCDs    Lab Results  Component Value Date   PLT 368 05/13/2014    Antibiotics     Anti-infectives    Start     Dose/Rate Route Frequency Ordered Stop   05/07/14 2100  [MAR Hold]  ceFAZolin (ANCEF) IVPB 2 g/50 mL premix  Status:  Discontinued     (MAR Hold since 05/07/14 1951)   2 g 100 mL/hr over 30 Minutes Intravenous  Once 05/07/14 1924 05/09/14 0720          Objective:   Filed Vitals:   05/12/14 0907 05/12/14 1325 05/12/14 2255 05/13/14 0509  BP: 101/62 98/64 100/59 106/63  Pulse: 97 93 94 85  Temp: 98.1 F (36.7 C) 97.5 F (36.4 C) 97.4 F (36.3 C) 98.3 F (36.8 C)  TempSrc: Oral Oral Oral Oral  Resp: '19 18 18 18  ' Height:      Weight:    77.248 kg (170 lb 4.8 oz)  SpO2: 99% 100% 100% 99%    Wt Readings from Last 3 Encounters:  05/13/14 77.248 kg (170 lb 4.8 oz)  02/04/14 68.493 kg (151 lb)  01/04/14 68.493 kg (151 lb)     Intake/Output Summary (Last 24 hours) at 05/13/14 1130 Last data filed at 05/13/14 0930  Gross per 24 hour  Intake   2780 ml  Output    831 ml  Net   1949 ml     Physical Exam  Somnolent, easily arousable, answers questions and follows commands, No new F.N deficits, Normal affect [place].AT,PERRAL Supple Neck,No JVD, No cervical lymphadenopathy appriciated.  Symmetrical Chest wall movement, Good air movement bilaterally, CTAB RRR,No Gallops,Rubs or new Murmurs, No Parasternal Heave Hypoactive but +ve B.Sounds, Abd Soft, No tenderness, No organomegaly appriciated, No rebound - guarding or rigidity.  NG in place, Abdominal incision site under bandage with JP drain in place.   No Cyanosis, Clubbing or edema, No new Rash or bruise      Data Review   Micro Results Recent Results (from the past 240 hour(s))  Urine culture     Status: None   Collection Time: 05/09/14 11:45 AM  Result Value  Ref Range Status   Specimen Description URINE, CLEAN CATCH  Final   Special Requests NONE  Final   Colony Count NO GROWTH Performed at [facility]   Final   Culture NO GROWTH Performed at [facility]   Final   Report Status 05/10/2014 FINAL  Final  Urine culture     Status: None   Collection Time: 05/10/14  7:18 AM  Result Value Ref Range Status   Specimen Description URINE, RANDOM  Final   Special Requests NONE  Final   Colony Count NO GROWTH Performed at [facility]   Final  Culture NO GROWTH Performed at [facility]   Final   Report Status 05/11/2014 FINAL  Final    Radiology Reports Dg Chest 2 View  05/09/2014   CLINICAL DATA:  Cough and postnasal drip for a few days.  Weakness.  EXAM: CHEST  2 VIEW  COMPARISON:  None.  FINDINGS: A right PICC is present and terminates near the cavoatrial junction. An enteric tube loops in the upper abdomen over the proximal stomach with side hole well beyond the GE junction and tip not imaged. There is slight elevation of the right hemidiaphragm. Cardiomediastinal silhouette is within normal limits. The lungs are slightly hypoinflated without evidence of airspace consolidation, edema, pleural effusion, or pneumothorax. Upper abdominal skin staples and a likely surgical drain are noted. No acute osseous abnormality is identified.  IMPRESSION: No active cardiopulmonary disease.   Electronically Signed   By: Logan Bores   On: 05/09/2014 14:59   Ct Head Wo Contrast  04/29/2014   CLINICAL DATA:  Fall. Hit left-sided head on dry air. Headaches. Neck pain.  EXAM: CT HEAD WITHOUT CONTRAST  CT CERVICAL SPINE WITHOUT CONTRAST  TECHNIQUE: Multidetector CT imaging of the head and cervical spine was performed following the standard protocol without intravenous contrast. Multiplanar CT image reconstructions of the cervical spine were also generated.  COMPARISON:  None.  FINDINGS: CT HEAD FINDINGS  No acute cortical infarct,  hemorrhage, or mass lesion ispresent. Ventricles are of normal size. No significant extra-axial fluid collection is present. The paranasal sinuses andmastoid air cells are clear. The osseous skull is intact.  CT CERVICAL SPINE FINDINGS  There is reversal of normal cervical lordosis which may reflect muscle spasm or patient positioning. The vertebral body heights are well preserved. The facet joints are all well aligned. The prevertebral soft tissue space is normal.  IMPRESSION: 1. No acute intracranial abnormalities. 2. Reversal of normal cervical lordosis which may reflect muscle spasm or patient positioning.   Electronically Signed   By: Kerby Moors M.D.   On: 04/29/2014 16:00   Ct Cervical Spine Wo Contrast  04/29/2014   CLINICAL DATA:  Fall. Hit left-sided head on dry air. Headaches. Neck pain.  EXAM: CT HEAD WITHOUT CONTRAST  CT CERVICAL SPINE WITHOUT CONTRAST  TECHNIQUE: Multidetector CT imaging of the head and cervical spine was performed following the standard protocol without intravenous contrast. Multiplanar CT image reconstructions of the cervical spine were also generated.  COMPARISON:  None.  FINDINGS: CT HEAD FINDINGS  No acute cortical infarct, hemorrhage, or mass lesion ispresent. Ventricles are of normal size. No significant extra-axial fluid collection is present. The paranasal sinuses andmastoid air cells are clear. The osseous skull is intact.  CT CERVICAL SPINE FINDINGS  There is reversal of normal cervical lordosis which may reflect muscle spasm or patient positioning. The vertebral body heights are well preserved. The facet joints are all well aligned. The prevertebral soft tissue space is normal.  IMPRESSION: 1. No acute intracranial abnormalities. 2. Reversal of normal cervical lordosis which may reflect muscle spasm or patient positioning.   Electronically Signed   By: Kerby Moors M.D.   On: 04/29/2014 16:00   Ir Angiogram Visceral Selective  05/05/2014   INDICATION: 34 year old  female admitted to the hospital with upper GI bleeding and hematemesis.  She was admitted April 26, 2013. Upper endoscopy demonstrates duodenum ulcer.  She has been stable over 1 week, though developed syncopal episode and acute anemia with a hemoglobin of 5.7 on today's date. She has  been referred for evaluation and treatment of the duodenum ulcer hemorrhage.  EXAM: 1. AORTIC ANGIOGRAM 2. SUPERIOR MESENTERIC ARTERIOGRAM 3. SUB SELECTION OF FOUR TERTIARY BRANCHES FROM THE SUPERIOR MESENTERIC ARTERY 4. ULTRASOUND GUIDED VASCULAR ACCESS OF THE RIGHT COMMON FEMORAL ARTERY 5. MANUAL PRESSURE FOR HEMOSTASIS.  COMPARISON:  CT abdomen pelvis, 04/27/2013, 09/22/2001  MEDICATIONS: Versed 2.0 mg IV; Fentanyl 100 mcg IV  CONTRAST:  160m OMNIPAQUE IOHEXOL 300 MG/ML  SOLN  ANESTHESIA/SEDATION: One hundred fifty minutes  FLUOROSCOPY TIME:  Forty-one minutes.  Fifty-four seconds.  2941 mGy  ACCESS: Right common femoral artery; hemostasis achieved with manual compression.  COMPLICATIONS: None immediate  TECHNIQUE: Informed written consent was obtained from the patient after a discussion of the risks, benefits and alternatives to treatment. Questions regarding the procedure were encouraged and answered. A timeout was performed prior to the initiation of the procedure.  The right groin was prepped and draped in the usual sterile fashion, and a sterile drape was applied covering the operative field. Maximum barrier sterile technique with sterile gowns and gloves were used for the procedure. A timeout was performed prior to the initiation of the procedure. Local anesthesia was provided with 1% lidocaine.  Physical exam was performed to identify the location of the right common femoral artery. Ultrasound survey was performed with images stored sent to PACs.  Once the common femoral artery was identified under ultrasound, ultrasound guidance was used to generously infiltrate the skin and subcutaneous tissues with 1% lidocaine without  epinephrine. A micropuncture access was then used to puncture the right common femoral artery. With excellent blood flow returned, and micro wire was advanced through the needle observed under fluoroscopy to enter the iliac system. The needle was removed from wire a micropuncture kit was advanced over the wire. The inner dilator and cannula were removed with a wire, and an 035 Bentson wire was advanced into the abdominal aorta under fluoroscopy the micro puncture was removed, and a 5 French sheath was advanced over the Bentson wire into the common femoral artery. The dilator was removed and the sheath was flushed.  A pigtail Flush catheter was advanced over the Bentson wire into the lower thoracic aorta. Aortic flush angiogram performed.  Pigtail catheter was then exchanged over wire for C2 Cobra catheter which was used to select the superior mesenteric artery. Superior mesenteric artery angiogram was performed.  Subsequently, a combination of the micro catheter a micro wire were used to select various branches of superior mesenteric artery in this patient with a celiacomesenteric trunk. A standard renegade micro catheter as well as a soft synchro an a standard .016 synchro wire were used to select branches of the superior mesenteric artery. Sub selection included:  Replaced common hepatic artery.  Right hepatic artery.  New middle colicky artery.  Proximal jejunal branches.  Two base catheter exchanges were made on the Bentson wire including exchange for Mickelson catheter and exchanged for a C2 Cobra catheter for formation of a Waltman loop.  After dedicated angiogram of each of the sub selected arteries, all catheters and wires were removed.  Angiogram through the sheath was performed with the common femoral artery.  Deployment of an Exoseal device for hemostasis failed without deployment. Manual pressure was used for hemostasis of the right common femoral artery.  Patient tolerated procedure well and remained  hemodynamically stable throughout. At the end of the procedure, the patient's vital signs were within normal range. Systolic brought pressure 110. Heart rate of 80-90. No syncopal events, no altered  mental status, no hematemesis, and no hematochezia/melena.  Marland Kitchen  FINDINGS: Aortic angiogram:  Normal course caliber and contour of the abdominal aorta. Multiple segmental vessels fill within the lower thoracic spine and lumbar spine. No aneurysm dissection flap identified. No significant atherosclerotic changes.  Steep angled take-off of the bilateral common iliac arteries.  Single bilateral renal arteries.  No celiac artery origin.  Superior mesenteric artery patent, with configuration of a celiacomesenteric trunk.  Late phase of the aortic rind demonstrates no evidence of bleeding.  Celiacomesenteric angiogram:  Normal caliber and contour of the origin of the trunk. Multiple jejunal branches present. Normal caliber and contour of the ileocolic artery. Multiple tortuous vessels overlie the proximal 3rd of the trunk, with evidence of a replaced common hepatic artery contributing to both right and left hepatic arteries. There is a replaced vessel representing a celiac vessel, with cross-filling at a bifurcation to the splenic artery and the hepatic arteries. Patent left gastric artery. New  Late phase of the trunk injection demonstrates no evidence of bleeding. No tumor blush or pooling of contrast. Patent portal venous system identified.  Common hepatic artery injection:  Replaced common hepatic artery with right hepatic arteries and left hepatic arteries from the replaced trunk. No evidence of tumor blush, aneurysm, dissection flap, pulling of contrast. No abnormal enhancement identified. No pseudoaneurysm identified.  No call it artery injection:  Patent middle call it artery of normal caliber. Significant cross flow from collateral vessels with no pooling of contrast, pseudoaneurysm, or tumor blush.  Proximal jejunal  arcade:  Normal course caliber and contour. Normal blush of the proximal jejunal mucosa. No filling defects, or pseudoaneurysm.  No embolization was performed.  IMPRESSION: Status post diagnostic mesenteric angiogram.  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  No bleeding was identified from the replaced common hepatic artery, which would be the best candidate for supplying the pancreaticoduodenal arteries near the known proximal duodenum ulcer. Because no bleeding was identified of this variant anatomy, no empiric embolization was performed.  Signed,  Dulcy Fanny. Earleen Newport, DO  [facility]  [facility]  PLAN: The patient will require 6 hours flattened bed with log roll only for hemostasis of the right common femoral artery after manual compression.  Agree with ICU management, including serial hemoglobin and hematocrit checks.   Electronically Signed   By: Corrie Mckusick D.O.   On: 05/05/2014 16:37   Ir Angiogram Selective Each Additional Vessel  05/05/2014   INDICATION: 34 year old female admitted to the hospital with upper GI bleeding and hematemesis.  She was admitted April 26, 2013. Upper endoscopy demonstrates duodenum ulcer.  She has been stable over 1 week, though developed syncopal episode and acute anemia with a hemoglobin of 5.7 on today's date. She has been referred for evaluation and treatment of the duodenum ulcer hemorrhage.  EXAM: 1. AORTIC ANGIOGRAM 2. SUPERIOR MESENTERIC ARTERIOGRAM 3. SUB SELECTION OF FOUR TERTIARY BRANCHES FROM THE SUPERIOR MESENTERIC ARTERY 4. ULTRASOUND GUIDED VASCULAR ACCESS OF THE RIGHT COMMON FEMORAL ARTERY 5. MANUAL PRESSURE FOR HEMOSTASIS.  COMPARISON:  CT abdomen pelvis, 04/27/2013, 09/22/2001  MEDICATIONS: Versed 2.0 mg IV; Fentanyl 100 mcg IV  CONTRAST:  139m OMNIPAQUE IOHEXOL 300 MG/ML  SOLN  ANESTHESIA/SEDATION: One hundred fifty minutes  FLUOROSCOPY  TIME:  Forty-one minutes.  Fifty-four seconds.  2941 mGy  ACCESS: Right common femoral artery; hemostasis achieved with manual compression.  COMPLICATIONS: None immediate  TECHNIQUE: Informed written consent was obtained from  the patient after a discussion of the risks, benefits and alternatives to treatment. Questions regarding the procedure were encouraged and answered. A timeout was performed prior to the initiation of the procedure.  The right groin was prepped and draped in the usual sterile fashion, and a sterile drape was applied covering the operative field. Maximum barrier sterile technique with sterile gowns and gloves were used for the procedure. A timeout was performed prior to the initiation of the procedure. Local anesthesia was provided with 1% lidocaine.  Physical exam was performed to identify the location of the right common femoral artery. Ultrasound survey was performed with images stored sent to PACs.  Once the common femoral artery was identified under ultrasound, ultrasound guidance was used to generously infiltrate the skin and subcutaneous tissues with 1% lidocaine without epinephrine. A micropuncture access was then used to puncture the right common femoral artery. With excellent blood flow returned, and micro wire was advanced through the needle observed under fluoroscopy to enter the iliac system. The needle was removed from wire a micropuncture kit was advanced over the wire. The inner dilator and cannula were removed with a wire, and an 035 Bentson wire was advanced into the abdominal aorta under fluoroscopy the micro puncture was removed, and a 5 French sheath was advanced over the Bentson wire into the common femoral artery. The dilator was removed and the sheath was flushed.  A pigtail Flush catheter was advanced over the Bentson wire into the lower thoracic aorta. Aortic flush angiogram performed.  Pigtail catheter was then exchanged over wire for C2 Cobra catheter which was used to  select the superior mesenteric artery. Superior mesenteric artery angiogram was performed.  Subsequently, a combination of the micro catheter a micro wire were used to select various branches of superior mesenteric artery in this patient with a celiacomesenteric trunk. A standard renegade micro catheter as well as a soft synchro an a standard .016 synchro wire were used to select branches of the superior mesenteric artery. Sub selection included:  Replaced common hepatic artery.  Right hepatic artery.  New middle colicky artery.  Proximal jejunal branches.  Two base catheter exchanges were made on the Bentson wire including exchange for Mickelson catheter and exchanged for a C2 Cobra catheter for formation of a Waltman loop.  After dedicated angiogram of each of the sub selected arteries, all catheters and wires were removed.  Angiogram through the sheath was performed with the common femoral artery.  Deployment of an Exoseal device for hemostasis failed without deployment. Manual pressure was used for hemostasis of the right common femoral artery.  Patient tolerated procedure well and remained hemodynamically stable throughout. At the end of the procedure, the patient's vital signs were within normal range. Systolic brought pressure 110. Heart rate of 80-90. No syncopal events, no altered mental status, no hematemesis, and no hematochezia/melena.  Marland Kitchen  FINDINGS: Aortic angiogram:  Normal course caliber and contour of the abdominal aorta. Multiple segmental vessels fill within the lower thoracic spine and lumbar spine. No aneurysm dissection flap identified. No significant atherosclerotic changes.  Steep angled take-off of the bilateral common iliac arteries.  Single bilateral renal arteries.  No celiac artery origin.  Superior mesenteric artery patent, with configuration of a celiacomesenteric trunk.  Late phase of the aortic rind demonstrates no evidence of bleeding.  Celiacomesenteric angiogram:  Normal caliber and  contour of the origin of the trunk. Multiple jejunal branches present. Normal caliber and contour of the ileocolic artery. Multiple tortuous vessels overlie  the proximal 3rd of the trunk, with evidence of a replaced common hepatic artery contributing to both right and left hepatic arteries. There is a replaced vessel representing a celiac vessel, with cross-filling at a bifurcation to the splenic artery and the hepatic arteries. Patent left gastric artery. New  Late phase of the trunk injection demonstrates no evidence of bleeding. No tumor blush or pooling of contrast. Patent portal venous system identified.  Common hepatic artery injection:  Replaced common hepatic artery with right hepatic arteries and left hepatic arteries from the replaced trunk. No evidence of tumor blush, aneurysm, dissection flap, pulling of contrast. No abnormal enhancement identified. No pseudoaneurysm identified.  No call it artery injection:  Patent middle call it artery of normal caliber. Significant cross flow from collateral vessels with no pooling of contrast, pseudoaneurysm, or tumor blush.  Proximal jejunal arcade:  Normal course caliber and contour. Normal blush of the proximal jejunal mucosa. No filling defects, or pseudoaneurysm.  No embolization was performed.  IMPRESSION: Status post diagnostic mesenteric angiogram.  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  No bleeding was identified from the replaced common hepatic artery, which would be the best candidate for supplying the pancreaticoduodenal arteries near the known proximal duodenum ulcer. Because no bleeding was identified of this variant anatomy, no empiric embolization was performed.  Signed,  Dulcy Fanny. Earleen Newport, DO  [facility]  [facility]  PLAN: The patient will require 6 hours flattened bed with log roll only for hemostasis of the right common femoral  artery after manual compression.  Agree with ICU management, including serial hemoglobin and hematocrit checks.   Electronically Signed   By: Corrie Mckusick D.O.   On: 05/05/2014 16:37   Ir Angiogram Selective Each Additional Vessel  05/05/2014   INDICATION: 34 year old female admitted to the hospital with upper GI bleeding and hematemesis.  She was admitted April 26, 2013. Upper endoscopy demonstrates duodenum ulcer.  She has been stable over 1 week, though developed syncopal episode and acute anemia with a hemoglobin of 5.7 on today's date. She has been referred for evaluation and treatment of the duodenum ulcer hemorrhage.  EXAM: 1. AORTIC ANGIOGRAM 2. SUPERIOR MESENTERIC ARTERIOGRAM 3. SUB SELECTION OF FOUR TERTIARY BRANCHES FROM THE SUPERIOR MESENTERIC ARTERY 4. ULTRASOUND GUIDED VASCULAR ACCESS OF THE RIGHT COMMON FEMORAL ARTERY 5. MANUAL PRESSURE FOR HEMOSTASIS.  COMPARISON:  CT abdomen pelvis, 04/27/2013, 09/22/2001  MEDICATIONS: Versed 2.0 mg IV; Fentanyl 100 mcg IV  CONTRAST:  137m OMNIPAQUE IOHEXOL 300 MG/ML  SOLN  ANESTHESIA/SEDATION: One hundred fifty minutes  FLUOROSCOPY TIME:  Forty-one minutes.  Fifty-four seconds.  2941 mGy  ACCESS: Right common femoral artery; hemostasis achieved with manual compression.  COMPLICATIONS: None immediate  TECHNIQUE: Informed written consent was obtained from the patient after a discussion of the risks, benefits and alternatives to treatment. Questions regarding the procedure were encouraged and answered. A timeout was performed prior to the initiation of the procedure.  The right groin was prepped and draped in the usual sterile fashion, and a sterile drape was applied covering the operative field. Maximum barrier sterile technique with sterile gowns and gloves were used for the procedure. A timeout was performed prior to the initiation of the procedure. Local anesthesia was provided with 1% lidocaine.  Physical exam was performed to identify the location of the  right common femoral artery. Ultrasound survey was performed with images stored sent to PACs.  Once the common femoral  artery was identified under ultrasound, ultrasound guidance was used to generously infiltrate the skin and subcutaneous tissues with 1% lidocaine without epinephrine. A micropuncture access was then used to puncture the right common femoral artery. With excellent blood flow returned, and micro wire was advanced through the needle observed under fluoroscopy to enter the iliac system. The needle was removed from wire a micropuncture kit was advanced over the wire. The inner dilator and cannula were removed with a wire, and an 035 Bentson wire was advanced into the abdominal aorta under fluoroscopy the micro puncture was removed, and a 5 French sheath was advanced over the Bentson wire into the common femoral artery. The dilator was removed and the sheath was flushed.  A pigtail Flush catheter was advanced over the Bentson wire into the lower thoracic aorta. Aortic flush angiogram performed.  Pigtail catheter was then exchanged over wire for C2 Cobra catheter which was used to select the superior mesenteric artery. Superior mesenteric artery angiogram was performed.  Subsequently, a combination of the micro catheter a micro wire were used to select various branches of superior mesenteric artery in this patient with a celiacomesenteric trunk. A standard renegade micro catheter as well as a soft synchro an a standard .016 synchro wire were used to select branches of the superior mesenteric artery. Sub selection included:  Replaced common hepatic artery.  Right hepatic artery.  New middle colicky artery.  Proximal jejunal branches.  Two base catheter exchanges were made on the Bentson wire including exchange for Mickelson catheter and exchanged for a C2 Cobra catheter for formation of a Waltman loop.  After dedicated angiogram of each of the sub selected arteries, all catheters and wires were removed.   Angiogram through the sheath was performed with the common femoral artery.  Deployment of an Exoseal device for hemostasis failed without deployment. Manual pressure was used for hemostasis of the right common femoral artery.  Patient tolerated procedure well and remained hemodynamically stable throughout. At the end of the procedure, the patient's vital signs were within normal range. Systolic brought pressure 110. Heart rate of 80-90. No syncopal events, no altered mental status, no hematemesis, and no hematochezia/melena.  Marland Kitchen  FINDINGS: Aortic angiogram:  Normal course caliber and contour of the abdominal aorta. Multiple segmental vessels fill within the lower thoracic spine and lumbar spine. No aneurysm dissection flap identified. No significant atherosclerotic changes.  Steep angled take-off of the bilateral common iliac arteries.  Single bilateral renal arteries.  No celiac artery origin.  Superior mesenteric artery patent, with configuration of a celiacomesenteric trunk.  Late phase of the aortic rind demonstrates no evidence of bleeding.  Celiacomesenteric angiogram:  Normal caliber and contour of the origin of the trunk. Multiple jejunal branches present. Normal caliber and contour of the ileocolic artery. Multiple tortuous vessels overlie the proximal 3rd of the trunk, with evidence of a replaced common hepatic artery contributing to both right and left hepatic arteries. There is a replaced vessel representing a celiac vessel, with cross-filling at a bifurcation to the splenic artery and the hepatic arteries. Patent left gastric artery. New  Late phase of the trunk injection demonstrates no evidence of bleeding. No tumor blush or pooling of contrast. Patent portal venous system identified.  Common hepatic artery injection:  Replaced common hepatic artery with right hepatic arteries and left hepatic arteries from the replaced trunk. No evidence of tumor blush, aneurysm, dissection flap, pulling of contrast.  No abnormal enhancement identified. No pseudoaneurysm identified.  No call it  artery injection:  Patent middle call it artery of normal caliber. Significant cross flow from collateral vessels with no pooling of contrast, pseudoaneurysm, or tumor blush.  Proximal jejunal arcade:  Normal course caliber and contour. Normal blush of the proximal jejunal mucosa. No filling defects, or pseudoaneurysm.  No embolization was performed.  IMPRESSION: Status post diagnostic mesenteric angiogram.  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  No bleeding was identified from the replaced common hepatic artery, which would be the best candidate for supplying the pancreaticoduodenal arteries near the known proximal duodenum ulcer. Because no bleeding was identified of this variant anatomy, no empiric embolization was performed.  Signed,  Dulcy Fanny. Earleen Newport, DO  [facility]  [facility]  PLAN: The patient will require 6 hours flattened bed with log roll only for hemostasis of the right common femoral artery after manual compression.  Agree with ICU management, including serial hemoglobin and hematocrit checks.   Electronically Signed   By: Corrie Mckusick D.O.   On: 05/05/2014 16:37   Ir Angiogram Selective Each Additional Vessel  05/05/2014   INDICATION: 34 year old female admitted to the hospital with upper GI bleeding and hematemesis.  She was admitted April 26, 2013. Upper endoscopy demonstrates duodenum ulcer.  She has been stable over 1 week, though developed syncopal episode and acute anemia with a hemoglobin of 5.7 on today's date. She has been referred for evaluation and treatment of the duodenum ulcer hemorrhage.  EXAM: 1. AORTIC ANGIOGRAM 2. SUPERIOR MESENTERIC ARTERIOGRAM 3. SUB SELECTION OF FOUR TERTIARY BRANCHES FROM THE SUPERIOR MESENTERIC ARTERY 4. ULTRASOUND GUIDED VASCULAR ACCESS OF THE RIGHT COMMON  FEMORAL ARTERY 5. MANUAL PRESSURE FOR HEMOSTASIS.  COMPARISON:  CT abdomen pelvis, 04/27/2013, 09/22/2001  MEDICATIONS: Versed 2.0 mg IV; Fentanyl 100 mcg IV  CONTRAST:  162m OMNIPAQUE IOHEXOL 300 MG/ML  SOLN  ANESTHESIA/SEDATION: One hundred fifty minutes  FLUOROSCOPY TIME:  Forty-one minutes.  Fifty-four seconds.  2941 mGy  ACCESS: Right common femoral artery; hemostasis achieved with manual compression.  COMPLICATIONS: None immediate  TECHNIQUE: Informed written consent was obtained from the patient after a discussion of the risks, benefits and alternatives to treatment. Questions regarding the procedure were encouraged and answered. A timeout was performed prior to the initiation of the procedure.  The right groin was prepped and draped in the usual sterile fashion, and a sterile drape was applied covering the operative field. Maximum barrier sterile technique with sterile gowns and gloves were used for the procedure. A timeout was performed prior to the initiation of the procedure. Local anesthesia was provided with 1% lidocaine.  Physical exam was performed to identify the location of the right common femoral artery. Ultrasound survey was performed with images stored sent to PACs.  Once the common femoral artery was identified under ultrasound, ultrasound guidance was used to generously infiltrate the skin and subcutaneous tissues with 1% lidocaine without epinephrine. A micropuncture access was then used to puncture the right common femoral artery. With excellent blood flow returned, and micro wire was advanced through the needle observed under fluoroscopy to enter the iliac system. The needle was removed from wire a micropuncture kit was advanced over the wire. The inner dilator and cannula were removed with a wire, and an 035 Bentson wire was advanced into the abdominal aorta under fluoroscopy the micro puncture was removed, and a 5 French sheath was advanced over the Bentson wire into the common femoral  artery. The dilator was  removed and the sheath was flushed.  A pigtail Flush catheter was advanced over the Bentson wire into the lower thoracic aorta. Aortic flush angiogram performed.  Pigtail catheter was then exchanged over wire for C2 Cobra catheter which was used to select the superior mesenteric artery. Superior mesenteric artery angiogram was performed.  Subsequently, a combination of the micro catheter a micro wire were used to select various branches of superior mesenteric artery in this patient with a celiacomesenteric trunk. A standard renegade micro catheter as well as a soft synchro an a standard .016 synchro wire were used to select branches of the superior mesenteric artery. Sub selection included:  Replaced common hepatic artery.  Right hepatic artery.  New middle colicky artery.  Proximal jejunal branches.  Two base catheter exchanges were made on the Bentson wire including exchange for Mickelson catheter and exchanged for a C2 Cobra catheter for formation of a Waltman loop.  After dedicated angiogram of each of the sub selected arteries, all catheters and wires were removed.  Angiogram through the sheath was performed with the common femoral artery.  Deployment of an Exoseal device for hemostasis failed without deployment. Manual pressure was used for hemostasis of the right common femoral artery.  Patient tolerated procedure well and remained hemodynamically stable throughout. At the end of the procedure, the patient's vital signs were within normal range. Systolic brought pressure 110. Heart rate of 80-90. No syncopal events, no altered mental status, no hematemesis, and no hematochezia/melena.  Marland Kitchen  FINDINGS: Aortic angiogram:  Normal course caliber and contour of the abdominal aorta. Multiple segmental vessels fill within the lower thoracic spine and lumbar spine. No aneurysm dissection flap identified. No significant atherosclerotic changes.  Steep angled take-off of the bilateral common iliac  arteries.  Single bilateral renal arteries.  No celiac artery origin.  Superior mesenteric artery patent, with configuration of a celiacomesenteric trunk.  Late phase of the aortic rind demonstrates no evidence of bleeding.  Celiacomesenteric angiogram:  Normal caliber and contour of the origin of the trunk. Multiple jejunal branches present. Normal caliber and contour of the ileocolic artery. Multiple tortuous vessels overlie the proximal 3rd of the trunk, with evidence of a replaced common hepatic artery contributing to both right and left hepatic arteries. There is a replaced vessel representing a celiac vessel, with cross-filling at a bifurcation to the splenic artery and the hepatic arteries. Patent left gastric artery. New  Late phase of the trunk injection demonstrates no evidence of bleeding. No tumor blush or pooling of contrast. Patent portal venous system identified.  Common hepatic artery injection:  Replaced common hepatic artery with right hepatic arteries and left hepatic arteries from the replaced trunk. No evidence of tumor blush, aneurysm, dissection flap, pulling of contrast. No abnormal enhancement identified. No pseudoaneurysm identified.  No call it artery injection:  Patent middle call it artery of normal caliber. Significant cross flow from collateral vessels with no pooling of contrast, pseudoaneurysm, or tumor blush.  Proximal jejunal arcade:  Normal course caliber and contour. Normal blush of the proximal jejunal mucosa. No filling defects, or pseudoaneurysm.  No embolization was performed.  IMPRESSION: Status post diagnostic mesenteric angiogram.  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  No bleeding was identified from the replaced common hepatic artery, which would be the best candidate for supplying the pancreaticoduodenal arteries near the known proximal duodenum ulcer. Because no bleeding was identified of this  variant anatomy, no empiric embolization was performed.  Signed,  Dulcy Fanny. Earleen Newport, DO  [facility]  [facility]  PLAN: The patient will require 6 hours flattened bed with log roll only for hemostasis of the right common femoral artery after manual compression.  Agree with ICU management, including serial hemoglobin and hematocrit checks.   Electronically Signed   By: Corrie Mckusick D.O.   On: 05/05/2014 16:37   Ir Angiogram Selective Each Additional Vessel  05/05/2014   INDICATION: 34 year old female admitted to the hospital with upper GI bleeding and hematemesis.  She was admitted April 26, 2013. Upper endoscopy demonstrates duodenum ulcer.  She has been stable over 1 week, though developed syncopal episode and acute anemia with a hemoglobin of 5.7 on today's date. She has been referred for evaluation and treatment of the duodenum ulcer hemorrhage.  EXAM: 1. AORTIC ANGIOGRAM 2. SUPERIOR MESENTERIC ARTERIOGRAM 3. SUB SELECTION OF FOUR TERTIARY BRANCHES FROM THE SUPERIOR MESENTERIC ARTERY 4. ULTRASOUND GUIDED VASCULAR ACCESS OF THE RIGHT COMMON FEMORAL ARTERY 5. MANUAL PRESSURE FOR HEMOSTASIS.  COMPARISON:  CT abdomen pelvis, 04/27/2013, 09/22/2001  MEDICATIONS: Versed 2.0 mg IV; Fentanyl 100 mcg IV  CONTRAST:  160m OMNIPAQUE IOHEXOL 300 MG/ML  SOLN  ANESTHESIA/SEDATION: One hundred fifty minutes  FLUOROSCOPY TIME:  Forty-one minutes.  Fifty-four seconds.  2941 mGy  ACCESS: Right common femoral artery; hemostasis achieved with manual compression.  COMPLICATIONS: None immediate  TECHNIQUE: Informed written consent was obtained from the patient after a discussion of the risks, benefits and alternatives to treatment. Questions regarding the procedure were encouraged and answered. A timeout was performed prior to the initiation of the procedure.  The right groin was prepped and draped in the usual sterile fashion, and a sterile drape was applied covering the operative  field. Maximum barrier sterile technique with sterile gowns and gloves were used for the procedure. A timeout was performed prior to the initiation of the procedure. Local anesthesia was provided with 1% lidocaine.  Physical exam was performed to identify the location of the right common femoral artery. Ultrasound survey was performed with images stored sent to PACs.  Once the common femoral artery was identified under ultrasound, ultrasound guidance was used to generously infiltrate the skin and subcutaneous tissues with 1% lidocaine without epinephrine. A micropuncture access was then used to puncture the right common femoral artery. With excellent blood flow returned, and micro wire was advanced through the needle observed under fluoroscopy to enter the iliac system. The needle was removed from wire a micropuncture kit was advanced over the wire. The inner dilator and cannula were removed with a wire, and an 035 Bentson wire was advanced into the abdominal aorta under fluoroscopy the micro puncture was removed, and a 5 French sheath was advanced over the Bentson wire into the common femoral artery. The dilator was removed and the sheath was flushed.  A pigtail Flush catheter was advanced over the Bentson wire into the lower thoracic aorta. Aortic flush angiogram performed.  Pigtail catheter was then exchanged over wire for C2 Cobra catheter which was used to select the superior mesenteric artery. Superior mesenteric artery angiogram was performed.  Subsequently, a combination of the micro catheter a micro wire were used to select various branches of superior mesenteric artery in this patient with a celiacomesenteric trunk. A standard renegade micro catheter as well as a soft synchro an a standard .016 synchro wire were used to select branches of the superior mesenteric artery. Sub selection included:  Replaced common hepatic artery.  Right hepatic  artery.  New middle colicky artery.  Proximal jejunal branches.   Two base catheter exchanges were made on the Bentson wire including exchange for Mickelson catheter and exchanged for a C2 Cobra catheter for formation of a Waltman loop.  After dedicated angiogram of each of the sub selected arteries, all catheters and wires were removed.  Angiogram through the sheath was performed with the common femoral artery.  Deployment of an Exoseal device for hemostasis failed without deployment. Manual pressure was used for hemostasis of the right common femoral artery.  Patient tolerated procedure well and remained hemodynamically stable throughout. At the end of the procedure, the patient's vital signs were within normal range. Systolic brought pressure 110. Heart rate of 80-90. No syncopal events, no altered mental status, no hematemesis, and no hematochezia/melena.  Marland Kitchen  FINDINGS: Aortic angiogram:  Normal course caliber and contour of the abdominal aorta. Multiple segmental vessels fill within the lower thoracic spine and lumbar spine. No aneurysm dissection flap identified. No significant atherosclerotic changes.  Steep angled take-off of the bilateral common iliac arteries.  Single bilateral renal arteries.  No celiac artery origin.  Superior mesenteric artery patent, with configuration of a celiacomesenteric trunk.  Late phase of the aortic rind demonstrates no evidence of bleeding.  Celiacomesenteric angiogram:  Normal caliber and contour of the origin of the trunk. Multiple jejunal branches present. Normal caliber and contour of the ileocolic artery. Multiple tortuous vessels overlie the proximal 3rd of the trunk, with evidence of a replaced common hepatic artery contributing to both right and left hepatic arteries. There is a replaced vessel representing a celiac vessel, with cross-filling at a bifurcation to the splenic artery and the hepatic arteries. Patent left gastric artery. New  Late phase of the trunk injection demonstrates no evidence of bleeding. No tumor blush or pooling  of contrast. Patent portal venous system identified.  Common hepatic artery injection:  Replaced common hepatic artery with right hepatic arteries and left hepatic arteries from the replaced trunk. No evidence of tumor blush, aneurysm, dissection flap, pulling of contrast. No abnormal enhancement identified. No pseudoaneurysm identified.  No call it artery injection:  Patent middle call it artery of normal caliber. Significant cross flow from collateral vessels with no pooling of contrast, pseudoaneurysm, or tumor blush.  Proximal jejunal arcade:  Normal course caliber and contour. Normal blush of the proximal jejunal mucosa. No filling defects, or pseudoaneurysm.  No embolization was performed.  IMPRESSION: Status post diagnostic mesenteric angiogram.  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  No bleeding was identified from the replaced common hepatic artery, which would be the best candidate for supplying the pancreaticoduodenal arteries near the known proximal duodenum ulcer. Because no bleeding was identified of this variant anatomy, no empiric embolization was performed.  Signed,  Dulcy Fanny. Earleen Newport, DO  [facility]  [facility]  PLAN: The patient will require 6 hours flattened bed with log roll only for hemostasis of the right common femoral artery after manual compression.  Agree with ICU management, including serial hemoglobin and hematocrit checks.   Electronically Signed   By: Corrie Mckusick D.O.   On: 05/05/2014 16:37   Ir US Guide Vasc Access Right  05/05/2014   INDICATION: 34 year old female admitted to the hospital with upper GI bleeding and hematemesis.  She was admitted April 26, 2013. Upper endoscopy demonstrates duodenum ulcer.  She has been stable over 1 week, though developed syncopal episode and acute  anemia with a hemoglobin of 5.7 on today's date. She has been referred for  evaluation and treatment of the duodenum ulcer hemorrhage.  EXAM: 1. AORTIC ANGIOGRAM 2. SUPERIOR MESENTERIC ARTERIOGRAM 3. SUB SELECTION OF FOUR TERTIARY BRANCHES FROM THE SUPERIOR MESENTERIC ARTERY 4. ULTRASOUND GUIDED VASCULAR ACCESS OF THE RIGHT COMMON FEMORAL ARTERY 5. MANUAL PRESSURE FOR HEMOSTASIS.  COMPARISON:  CT abdomen pelvis, 04/27/2013, 09/22/2001  MEDICATIONS: Versed 2.0 mg IV; Fentanyl 100 mcg IV  CONTRAST:  143m OMNIPAQUE IOHEXOL 300 MG/ML  SOLN  ANESTHESIA/SEDATION: One hundred fifty minutes  FLUOROSCOPY TIME:  Forty-one minutes.  Fifty-four seconds.  2941 mGy  ACCESS: Right common femoral artery; hemostasis achieved with manual compression.  COMPLICATIONS: None immediate  TECHNIQUE: Informed written consent was obtained from the patient after a discussion of the risks, benefits and alternatives to treatment. Questions regarding the procedure were encouraged and answered. A timeout was performed prior to the initiation of the procedure.  The right groin was prepped and draped in the usual sterile fashion, and a sterile drape was applied covering the operative field. Maximum barrier sterile technique with sterile gowns and gloves were used for the procedure. A timeout was performed prior to the initiation of the procedure. Local anesthesia was provided with 1% lidocaine.  Physical exam was performed to identify the location of the right common femoral artery. Ultrasound survey was performed with images stored sent to PACs.  Once the common femoral artery was identified under ultrasound, ultrasound guidance was used to generously infiltrate the skin and subcutaneous tissues with 1% lidocaine without epinephrine. A micropuncture access was then used to puncture the right common femoral artery. With excellent blood flow returned, and micro wire was advanced through the needle observed under fluoroscopy to enter the iliac system. The needle was removed from wire a micropuncture kit was advanced over the  wire. The inner dilator and cannula were removed with a wire, and an 035 Bentson wire was advanced into the abdominal aorta under fluoroscopy the micro puncture was removed, and a 5 French sheath was advanced over the Bentson wire into the common femoral artery. The dilator was removed and the sheath was flushed.  A pigtail Flush catheter was advanced over the Bentson wire into the lower thoracic aorta. Aortic flush angiogram performed.  Pigtail catheter was then exchanged over wire for C2 Cobra catheter which was used to select the superior mesenteric artery. Superior mesenteric artery angiogram was performed.  Subsequently, a combination of the micro catheter a micro wire were used to select various branches of superior mesenteric artery in this patient with a celiacomesenteric trunk. A standard renegade micro catheter as well as a soft synchro an a standard .016 synchro wire were used to select branches of the superior mesenteric artery. Sub selection included:  Replaced common hepatic artery.  Right hepatic artery.  New middle colicky artery.  Proximal jejunal branches.  Two base catheter exchanges were made on the Bentson wire including exchange for Mickelson catheter and exchanged for a C2 Cobra catheter for formation of a Waltman loop.  After dedicated angiogram of each of the sub selected arteries, all catheters and wires were removed.  Angiogram through the sheath was performed with the common femoral artery.  Deployment of an Exoseal device for hemostasis failed without deployment. Manual pressure was used for hemostasis of the right common femoral artery.  Patient tolerated procedure well and remained hemodynamically stable throughout. At the end of the procedure, the patient's vital signs were within normal range. Systolic brought  pressure 110. Heart rate of 80-90. No syncopal events, no altered mental status, no hematemesis, and no hematochezia/melena.  Marland Kitchen  FINDINGS: Aortic angiogram:  Normal course  caliber and contour of the abdominal aorta. Multiple segmental vessels fill within the lower thoracic spine and lumbar spine. No aneurysm dissection flap identified. No significant atherosclerotic changes.  Steep angled take-off of the bilateral common iliac arteries.  Single bilateral renal arteries.  No celiac artery origin.  Superior mesenteric artery patent, with configuration of a celiacomesenteric trunk.  Late phase of the aortic rind demonstrates no evidence of bleeding.  Celiacomesenteric angiogram:  Normal caliber and contour of the origin of the trunk. Multiple jejunal branches present. Normal caliber and contour of the ileocolic artery. Multiple tortuous vessels overlie the proximal 3rd of the trunk, with evidence of a replaced common hepatic artery contributing to both right and left hepatic arteries. There is a replaced vessel representing a celiac vessel, with cross-filling at a bifurcation to the splenic artery and the hepatic arteries. Patent left gastric artery. New  Late phase of the trunk injection demonstrates no evidence of bleeding. No tumor blush or pooling of contrast. Patent portal venous system identified.  Common hepatic artery injection:  Replaced common hepatic artery with right hepatic arteries and left hepatic arteries from the replaced trunk. No evidence of tumor blush, aneurysm, dissection flap, pulling of contrast. No abnormal enhancement identified. No pseudoaneurysm identified.  No call it artery injection:  Patent middle call it artery of normal caliber. Significant cross flow from collateral vessels with no pooling of contrast, pseudoaneurysm, or tumor blush.  Proximal jejunal arcade:  Normal course caliber and contour. Normal blush of the proximal jejunal mucosa. No filling defects, or pseudoaneurysm.  No embolization was performed.  IMPRESSION: Status post diagnostic mesenteric angiogram.  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  No bleeding was identified from the replaced common hepatic artery, which would be the best candidate for supplying the pancreaticoduodenal arteries near the known proximal duodenum ulcer. Because no bleeding was identified of this variant anatomy, no empiric embolization was performed.  Signed,  Dulcy Fanny. Earleen Newport, DO  [facility]  [facility]  PLAN: The patient will require 6 hours flattened bed with log roll only for hemostasis of the right common femoral artery after manual compression.  Agree with ICU management, including serial hemoglobin and hematocrit checks.   Electronically Signed   By: Corrie Mckusick D.O.   On: 05/05/2014 16:37     CBC  Recent Labs Lab 05/09/14 0345  05/10/14 0430 05/10/14 1100 05/10/14 1700 05/11/14 0500 05/12/14 0517 05/13/14 0545  WBC 14.7*  --  11.2*  --   --  8.2 7.8 5.6  HGB 9.5*  < > 8.1* 8.2* 8.0* 7.8* 8.0* 7.5*  HCT 28.2*  < > 24.4* 24.7* 24.3* 23.9* 24.2* 23.3*  PLT 211  --  215  --   --  279 348 368  MCV 87.6  --  88.4  --   --  89.2 89.6 90.0  MCH 29.5  --  29.3  --   --  29.1 29.6 29.0  MCHC 33.7  --  33.2  --   --  32.6 33.1 32.2  RDW 16.1*  --  16.0*  --   --  15.5 14.9 14.9  < > = values in this interval not displayed.  Chemistries   Recent Labs Lab 05/07/14 0500  05/09/14 0310 05/10/14 0430  05/11/14 0500 05/12/14 0517 05/13/14 0545  NA 137  < > 135 138 138 140 139  K 3.7  < > 3.5 3.4* 3.5 3.9 3.8  CL 109  < > 104 103 106 108 106  CO2 24  < > '28 28 23 28 27  ' GLUCOSE 102*  < > 135* 109* 109* 106* 102*  BUN 14  < > <5* <5* <5* <5* <5*  CREATININE 0.53  < > 0.61 0.63 0.54 0.56 0.56  CALCIUM 7.7*  < > 7.8* 7.9* 8.1* 8.3* 8.1*  MG 2.0  --  1.8 1.9  --   --  1.9  < > = values in this interval not displayed. ------------------------------------------------------------------------------------------------------------------ estimated creatinine clearance is  97.5 mL/min (by C-G formula based on Cr of 0.56). ------------------------------------------------------------------------------------------------------------------ No results for input(s): HGBA1C in the last 72 hours. ------------------------------------------------------------------------------------------------------------------ No results for input(s): CHOL, HDL, LDLCALC, TRIG, CHOLHDL, LDLDIRECT in the last 72 hours. ------------------------------------------------------------------------------------------------------------------ No results for input(s): TSH, T4TOTAL, T3FREE, THYROIDAB in the last 72 hours.  Invalid input(s): FREET3 ------------------------------------------------------------------------------------------------------------------ No results for input(s): VITAMINB12, FOLATE, FERRITIN, TIBC, IRON, RETICCTPCT in the last 72 hours.  Coagulation profile  Recent Labs Lab 05/07/14 2020  INR 1.18    No results for input(s): DDIMER in the last 72 hours.  Cardiac Enzymes No results for input(s): CKMB, TROPONINI, MYOGLOBIN in the last 168 hours.  Invalid input(s): CK ------------------------------------------------------------------------------------------------------------------ Invalid input(s): POCBNP     Time Spent in minutes   45   Lala Lund K M.D on 05/13/2014 at 11:30 AM  Between 7am to 7pm - Pager - [phone]  After 7pm go to [email or web address] - password [facility]  [facility]   Office  [phone]

## 2014-05-14 LAB — URINE CULTURE: Colony Count: 50000

## 2014-05-14 LAB — HEMOGLOBIN AND HEMATOCRIT, BLOOD
HCT: 25.2 % — ABNORMAL LOW (ref 36.0–46.0)
Hemoglobin: 8.1 g/dL — ABNORMAL LOW (ref 12.0–15.0)

## 2014-05-14 MED ORDER — NICOTINE 14 MG/24HR TD PT24: 14.0000 mg | MEDICATED_PATCH | Freq: Every day | TRANSDERMAL | Status: DC

## 2014-05-14 MED ORDER — DOCUSATE SODIUM 100 MG PO CAPS: 200.0000 mg | ORAL_CAPSULE | Freq: Two times a day (BID) | ORAL | Status: DC | PRN

## 2014-05-14 MED ORDER — SUCRALFATE 1 GM/10ML PO SUSP: 1.0000 g | Freq: Three times a day (TID) | ORAL | Status: DC

## 2014-05-14 MED ORDER — FERROUS SULFATE 325 (65 FE) MG PO TABS: 325.0000 mg | ORAL_TABLET | Freq: Two times a day (BID) | ORAL | Status: DC

## 2014-05-14 MED ORDER — PANTOPRAZOLE SODIUM 40 MG PO TBEC: 40.0000 mg | DELAYED_RELEASE_TABLET | Freq: Two times a day (BID) | ORAL | Status: DC

## 2014-05-14 MED ORDER — OXYCODONE-ACETAMINOPHEN 10-325 MG PO TABS: 1.0000 | ORAL_TABLET | Freq: Four times a day (QID) | ORAL | Status: DC | PRN

## 2014-05-14 NOTE — Discharge Instructions (Signed)
ABDOMINAL SURGERY: POST OP INSTRUCTIONS ° °1. DIET: Follow a light bland diet the first 24 hours after arrival home, such as soup, liquids, crackers, etc.  Be sure to include lots of fluids daily.  Avoid fast food or heavy meals as your are more likely to get nauseated.  Eat a low fat the next few days after surgery.   °2. Take your usually prescribed home medications unless otherwise directed. °3. PAIN CONTROL: °a. Pain is best controlled by a usual combination of three different methods TOGETHER: °i. Ice/Heat °ii. Over the counter pain medication °iii. Prescription pain medication °b. Most patients will experience some swelling and bruising around the incisions.  Ice packs or heating pads (30-60 minutes up to 6 times a day) will help. Use ice for the first few days to help decrease swelling and bruising, then switch to heat to help relax tight/sore spots and speed recovery.  Some people prefer to use ice alone, heat alone, alternating between ice & heat.  Experiment to what works for you.  Swelling and bruising can take several weeks to resolve.   °c. It is helpful to take an over-the-counter pain medication regularly for the first few weeks.  Choose one of the following that works best for you: °i. Naproxen (Aleve, etc)  Two 220mg tabs twice a day °ii. Ibuprofen (Advil, etc) Three 200mg tabs four times a day (every meal & bedtime) °iii. Acetaminophen (Tylenol, etc) 500-650mg four times a day (every meal & bedtime) °d. A  prescription for pain medication (such as oxycodone, hydrocodone, etc) should be given to you upon discharge.  Take your pain medication as prescribed.  °i. If you are having problems/concerns with the prescription medicine (does not control pain, nausea, vomiting, rash, itching, etc), please call us [phone] to see if we need to switch you to a different pain medicine that will work better for you and/or control your side effect better. °ii. If you need a refill on your pain medication,  please contact your pharmacy.  They will contact our office to request authorization. Prescriptions will not be filled after 5 pm or on week-ends. °4. Avoid getting constipated.  Between the surgery and the pain medications, it is common to experience some constipation.  Increasing fluid intake and taking a fiber supplement (such as Metamucil, Citrucel, FiberCon, MiraLax, etc) 1-2 times a day regularly will usually help prevent this problem from occurring.  A mild laxative (prune juice, Milk of Magnesia, MiraLax, etc) should be taken according to package directions if there are no bowel movements after 48 hours.   °5. Watch out for diarrhea.  If you have many loose bowel movements, simplify your diet to bland foods & liquids for a few days.  Stop any stool softeners and decrease your fiber supplement.  Switching to mild anti-diarrheal medications (Kayopectate, Pepto Bismol) can help.  If this worsens or does not improve, please call us. °6. Wash / shower every day.  You may shower over the incision / wound.  Avoid baths until the skin is fully healed.  Continue to shower over incision(s) after the dressing is off. °7. Remove your waterproof bandages 5 days after surgery.  You may leave the incision open to air.  You may replace a dressing/Band-Aid to cover the incision for comfort if you wish. °8. ACTIVITIES as tolerated:   °a. You may resume regular (light) daily activities beginning the next day--such as daily self-care, walking, climbing stairs--gradually increasing activities as tolerated.  If you can   walk 30 minutes without difficulty, it is safe to try more intense activity such as jogging, treadmill, bicycling, low-impact aerobics, swimming, etc. b. Save the most intensive and strenuous activity for last such as sit-ups, heavy lifting, contact sports, etc  Refrain from any heavy lifting or straining until you are off narcotics for pain control.   c. DO NOT PUSH THROUGH PAIN.  Let pain be your guide: If it  hurts to do something, don't do it.  Pain is your body warning you to avoid that activity for another week until the pain goes down. d. You may drive when you are no longer taking prescription pain medication, you can comfortably wear a seatbelt, and you can safely maneuver your car and apply brakes. e. Bonita QuinYou may have sexual intercourse when it is comfortable.  9. FOLLOW UP in our office a. Please call [facility] at [phone] to set up an appointment to see your surgeon in the office for a follow-up appointment approximately 1-2 weeks after your surgery. b. Make sure that you call for this appointment the day you arrive home to insure a convenient appointment time. 10. IF YOU HAVE DISABILITY OR FAMILY LEAVE FORMS, BRING THEM TO THE OFFICE FOR PROCESSING.  DO NOT GIVE THEM TO YOUR DOCTOR.   WHEN TO CALL US [phone]) [phone]: 1. Poor pain control 2. Reactions / problems with new medications (rash/itching, nausea, etc)  3. Fever over 101.5 F (38.5 C) 4. Inability to urinate 5. Nausea and/or vomiting 6. Worsening swelling or bruising 7. Continued bleeding from incision. 8. Increased pain, redness, or drainage from the incision  The clinic staff is available to answer your questions during regular business hours (8:30am-5pm).  Please dont hesitate to call and ask to speak to one of our nurses for clinical concerns.   A surgeon from [facility] is always on call at the hospitals   If you have a medical emergency, go to the nearest emergency room or call 911.    [facility] [address], [address], [place], [place]  [id number] ? MAIN: [phone]) [phone] ? TOLL FREE: [phone] ? FAX [phone] [email or web address]   Follow with Primary MD LUKING,SCOTT, MD in 7 days   Get CBC, CMP, 2 view Chest X ray checked  by Primary MD next visit.    Activity: As tolerated with Full fall precautions use walker/cane & assistance as needed   Disposition Home      For Heart failure patients - Check your Weight same time everyday, if you gain over 2 pounds, or you develop in leg swelling, experience more shortness of breath or chest pain, call your Primary MD immediately. Follow Cardiac Low Salt Diet and 1.5 lit/day fluid restriction.   On your next visit with your primary care physician please Get Medicines reviewed and adjusted.   Please request your Prim.MD to go over all Hospital Tests and Procedure/Radiological results at the follow up, please get all Hospital records sent to your Prim MD by signing hospital release before you go home.   If you experience worsening of your admission symptoms, develop shortness of breath, life threatening emergency, suicidal or homicidal thoughts you must seek medical attention immediately by calling 911 or calling your MD immediately  if symptoms less severe.  You Must read complete instructions/literature along with all the possible adverse reactions/side effects for all the Medicines you take and that have been prescribed to you. Take any new Medicines after you have completely understood and  accpet all the possible adverse reactions/side effects.   Do not drive, operating heavy machinery, perform activities at heights, swimming or participation in water activities or provide baby sitting services if your were admitted for syncope or siezures until you have seen by Primary MD or a Neurologist and advised to do so again.  Do not drive when taking Pain medications.    Do not take more than prescribed Pain, Sleep and Anxiety Medications  Special Instructions: If you have smoked or chewed Tobacco  in the last 2 yrs please stop smoking, stop any regular Alcohol  and or any Recreational drug use.  Wear Seat belts while driving.   Please note  You were cared for by a hospitalist during your hospital stay. If you have any questions about your discharge medications or the care you received while you were in the  hospital after you are discharged, you can call the unit and asked to speak with the hospitalist on call if the hospitalist that took care of you is not available. Once you are discharged, your primary care physician will handle any further medical issues. Please note that NO REFILLS for any discharge medications will be authorized once you are discharged, as it is imperative that you return to your primary care physician (or establish a relationship with a primary care physician if you do not have one) for your aftercare needs so that they can reassess your need for medications and monitor your lab values.

## 2014-05-14 NOTE — Progress Notes (Signed)
Patient ID: Kimberly Weiss, female   DOB: November 21, 1980, 34 y.o.   MRN: [id number]     [place]      [address] [place], [place] [address]    Phone: [phone] FAX: [phone]     Subjective: No issues.  Tolerating PO.  Having BMs and passing flatus.  VSS.  Afebrile.   Objective:  Vital signs:  Filed Vitals:   05/12/14 2255 05/13/14 0509 05/13/14 1330 05/14/14 0537  BP: 100/59 1'06/63 92/53 96/53 '  Pulse: 94 85 98 98  Temp: 97.4 F (36.3 C) 98.3 F (36.8 C) 98.3 F (36.8 C) 98.6 F (37 C)  TempSrc: Oral Oral Oral Oral  Resp: '18 18 18 18  ' Height:      Weight:  170 lb 4.8 oz (77.248 kg)  153 lb 4.8 oz (69.536 kg)  SpO2: 100% 99% 98% 97%    Last BM Date: 05/12/14  Intake/Output   Yesterday:  03/14 0701 - 03/15 0700 In: 1200 [P.O.:1200] Out: -  This shift: I/O last 3 completed shifts: In: 3500 [P.O.:1200; I.V.:2300] Out: 800 [Urine:800]      Physical Exam: General: Pt awake/alert/oriented x4 in no acute distress  Abdomen: Soft. Nondistended. JP drain with serosanguinous output. Mildly tender at incision only.Staples in place, edges are approximated no drainage and no erythema. No evidence of peritonitis. No incarcerated hernias.   Problem List:   Active Problems:   UGIB (upper gastrointestinal bleed)   Acute blood loss anemia   Head trauma   Tachycardia   Other specified hypotension   Duodenal ulcer   Hypovolemic shock   Tobacco abuse   Other specified hypothyroidism   Anxiety state   GI bleed   Gastrointestinal hemorrhage associated with duodenal ulcer   Duodenal ulcer with hemorrhage   Hemorrhagic shock    Results:   Labs: Results for orders placed or performed during the hospital encounter of 04/26/14 (from the past 48 hour(s))  Urinalysis, Routine w reflex microscopic     Status: Abnormal   Collection Time: 05/12/14 10:40 PM  Result Value Ref Range   Color, Urine YELLOW  YELLOW   APPearance CLEAR CLEAR   Specific Gravity, Urine 1.006 1.005 - 1.030   pH 7.0 5.0 - 8.0   Glucose, UA NEGATIVE NEGATIVE mg/dL   Hgb urine dipstick TRACE (A) NEGATIVE   Bilirubin Urine NEGATIVE NEGATIVE   Ketones, ur NEGATIVE NEGATIVE mg/dL   Protein, ur NEGATIVE NEGATIVE mg/dL   Urobilinogen, UA 1.0 0.0 - 1.0 mg/dL   Nitrite NEGATIVE NEGATIVE   Leukocytes, UA TRACE (A) NEGATIVE  Urine microscopic-add on     Status: None   Collection Time: 05/12/14 10:40 PM  Result Value Ref Range   WBC, UA 0-2 <3 WBC/hpf   RBC / HPF 0-2 <3 RBC/hpf  CBC     Status: Abnormal   Collection Time: 05/13/14  5:45 AM  Result Value Ref Range   WBC 5.6 4.0 - 10.5 K/uL   RBC 2.59 (L) 3.87 - 5.11 MIL/uL   Hemoglobin 7.5 (L) 12.0 - 15.0 g/dL   HCT 23.3 (L) 36.0 - 46.0 %   MCV 90.0 78.0 - 100.0 fL   MCH 29.0 26.0 - 34.0 pg   MCHC 32.2 30.0 - 36.0 g/dL   RDW 14.9 11.5 - 15.5 %   Platelets 368 150 - 400 K/uL  Basic metabolic panel     Status: Abnormal   Collection Time: 05/13/14  5:45 AM  Result  Value Ref Range   Sodium 139 135 - 145 mmol/L   Potassium 3.8 3.5 - 5.1 mmol/L   Chloride 106 96 - 112 mmol/L   CO2 27 19 - 32 mmol/L   Glucose, Bld 102 (H) 70 - 99 mg/dL   BUN <5 (L) 6 - 23 mg/dL   Creatinine, Ser 0.56 0.50 - 1.10 mg/dL   Calcium 8.1 (L) 8.4 - 10.5 mg/dL   GFR calc non Af Amer >90 >90 mL/min   GFR calc Af Amer >90 >90 mL/min    Comment: (NOTE) The eGFR has been calculated using the CKD EPI equation. This calculation has not been validated in all clinical situations. eGFR's persistently <90 mL/min signify possible Chronic Kidney Disease.    Anion gap 6 5 - 15  Magnesium     Status: None   Collection Time: 05/13/14  5:45 AM  Result Value Ref Range   Magnesium 1.9 1.5 - 2.5 mg/dL  Phosphorus     Status: None   Collection Time: 05/13/14  5:45 AM  Result Value Ref Range   Phosphorus 2.9 2.3 - 4.6 mg/dL  Hemoglobin and hematocrit, blood     Status: Abnormal   Collection Time:  05/14/14  5:20 AM  Result Value Ref Range   Hemoglobin 8.1 (L) 12.0 - 15.0 g/dL   HCT 25.2 (L) 36.0 - 46.0 %    Imaging / Studies: No results found.  Medications / Allergies:  Scheduled Meds: . busPIRone  7.5 mg Oral BID  . docusate sodium  200 mg Oral BID  . ferrous sulfate  325 mg Oral BID WC  . fluticasone  2 spray Each Nare Daily  . levothyroxine  50 mcg Oral QAC breakfast  . nicotine  14 mg Transdermal Daily  . pantoprazole  40 mg Oral BID  . sodium chloride  1 spray Each Nare BID  . sucralfate  1 g Oral TID WC & HS  . venlafaxine XR  150 mg Oral Daily   Continuous Infusions:  PRN Meds:.acetaminophen, albuterol, HYDROmorphone (DILAUDID) injection, LORazepam, methocarbamol (ROBAXIN)  IV, ondansetron (ZOFRAN) IV, oxyCODONE-acetaminophen, phenol, sodium chloride  Antibiotics: Anti-infectives    Start     Dose/Rate Route Frequency Ordered Stop   05/07/14 2100  [MAR Hold]  ceFAZolin (ANCEF) IVPB 2 g/50 mL premix  Status:  Discontinued     (MAR Hold since 05/07/14 1951)   2 g 100 mL/hr over 30 Minutes Intravenous  Once 05/07/14 1924 05/09/14 0720      Assessment/Plan Bleeding duodenal ulcer s/p POD #7 exploratory laparotomy, oversew of duodenal ulcer -tolerating diet, having BMs, ambulating.  Stable for DC from surgical standpoint.   -DC staples and apply steristrips -DC JP Drain -f/u with Dr. Grandville Silos in 2-3 weeks -restrictions discussed -JP drain to remain until day of discharge -Staples to be removed on 05/17/14 -pain meds per primary team.    Erby Pian, [facility] Pager [phone](7A-4:30P) For consults and floor pages call [phone](7A-4:30P)  05/14/2014 8:09 AM

## 2014-05-14 NOTE — Progress Notes (Signed)
Patient discharged home in stable condition. Verbalizes understanding of all discharge instructions, including home medications and follow up appointments. 

## 2014-05-14 NOTE — Discharge Summary (Signed)
Kimberly Weiss, is a 34 y.o. female  DOB 01-Jul-1980  MRN [id number].  Admission date:  04/26/2014  Admitting Physician  Colbert Coyer, MD  Discharge Date:  05/14/2014   Primary MD  Sallee Lange, MD  Recommendations for primary care physician for things to follow:   CBC, BMP, iron panel closely.   Admission Diagnosis  Other specified hypotension [I95.89] Lactic acidosis [E87.2] Acute blood loss anemia [D62] Tachycardia [R00.0] UGIB (upper gastrointestinal bleed) [K92.2]   Discharge Diagnosis  Other specified hypotension [I95.89] Lactic acidosis [E87.2] Acute blood loss anemia [D62] Tachycardia [R00.0] UGIB (upper gastrointestinal bleed) [K92.2]     Active Problems:   UGIB (upper gastrointestinal bleed)   Acute blood loss anemia   Head trauma   Tachycardia   Other specified hypotension   Duodenal ulcer   Hypovolemic shock   Tobacco abuse   Other specified hypothyroidism   Anxiety state   GI bleed   Gastrointestinal hemorrhage associated with duodenal ulcer   Duodenal ulcer with hemorrhage   Hemorrhagic shock      Past Medical History  Diagnosis Date  . Renal disorder   . Interstitial cystitis   . Thyroid disease   . Hypothyroidism   . Renal stones   . Back pain     Past Surgical History  Procedure Laterality Date  . Esophagogastroduodenoscopy N/A 04/27/2014    Procedure: ESOPHAGOGASTRODUODENOSCOPY (EGD);  Surgeon: Lear Ng, MD;  Location: [facility] ENDOSCOPY;  Service: Endoscopy;  Laterality: N/A;  . Esophagogastroduodenoscopy (egd) with propofol N/A 05/03/2014    Procedure: ESOPHAGOGASTRODUODENOSCOPY (EGD) WITH PROPOFOL;  Surgeon: Lear Ng, MD;  Location: [place];  Service: Endoscopy;  Laterality: N/A;  . Laparotomy N/A 05/07/2014    Procedure: EXPLORATORY  LAPAROTOMY;  Surgeon: Georganna Skeans, MD;  Location: [place];  Service: General;  Laterality: N/A;  . Repair of perforated ulcer N/A 05/07/2014    Procedure: Graylon Gunning DUODENAL ULCER;  Surgeon: Georganna Skeans, MD;  Location: [place];  Service: General;  Laterality: N/A;       History of present illness and  Hospital Course:     Kindly see H&P for history of present illness and admission details, please review complete Labs, Consult reports and Test reports for all details in brief  HPI - 34 y/o woman admitted 2/26 after syncopal episode. Work up concerning for UGI bleed likely due to NSAID usage. PCCM consulted for ICU admission 2/26 . Found to have large UGIB d/t duodenal ulcer on EGD .Initially required 5 u PRBC , tx w/ PPI stable to [facility] on 3/1 . Reconsulted 3/6 for Hypotension/GI bleed. Pt transferred to ICU . IR eval for possible embolization which failed, she was subsequently taken to or and underwent exploratory lap with suturing of bleeding duodenal ulcer. She was stabilized in ICU and transferred under my care on 05/09/2014.   Hospital Course     1. Severe upper GI bleed with blood loss treated anemia secondary to duodenal ulcer. Likely due to NSAID use, Seen by GI, general surgery and  critical care. Status post exploratory laparotomy with oversew of duodenal ulcer by general surgery on 05/07/2014. Has received over 5 units of RBCs this admission, now stable, tolerating diet, having bowel movements, no evidence of ongoing bleeding, seen by general surgery cleared for discharge, placed on twice a day PPI along with Carafate and iron supplement. To follow with general surgery, GI and PCP on a close basis post discharge. Her H pylori IgG was negative.    2. Hypovolemic hemorrhagic shock and hypotension. Currently resolved after IV fluid, packed RBC transfusion and supportive care.     3. Chronic tobacco abuse, alcohol use. Counseled to quit both. Nicoderm patch.     4.  Hypothyroidism - tinea home dose Synthroid     5. Chr back pain. Has tremendous narcotic tolerance due home use. She did have narcotic seeking behavior has been strictly counseled not to overuse narcotics. And to follow with her pain clinic as soon as possible.           Discharge Condition: Stable   Follow UP  Follow-up Information    Follow up with LUKING,SCOTT, MD. Schedule an appointment as soon as possible for a visit in 3 days.   Specialty:  Family Medicine   Contact information:   [place] [address] [phone]       Follow up with [facility]. Schedule an appointment as soon as possible for a visit in 3 days.   Why:  Gastric surgery   Contact information:   [address] [address] [phone]      Follow up with Lear Ng., MD. Schedule an appointment as soon as possible for a visit in 1 week.   Specialty:  Gastroenterology   Why:  Peptic Ulcer   Contact information:   [address], [place] [address] [phone]       Follow up with Zenovia Jarred, MD.   Specialty:  General Surgery   Why:  2-3 weeks for post op check   Contact information:   [place] [address] [phone]         Discharge Instructions  and  Discharge Medications     Discharge Instructions    Discharge instructions    Complete by:  As directed   ABDOMINAL SURGERY: POST OP INSTRUCTIONS  DIET: Follow a light bland diet the first 24 hours after arrival home, such as soup, liquids, crackers, etc.  Be sure to include lots of fluids daily.  Avoid fast food or heavy meals as your are more likely to get nauseated.  Eat a low fat the next few days after surgery.   Take your usually prescribed home medications unless otherwise directed. PAIN CONTROL: Pain is best controlled by a usual combination of three different methods TOGETHER: Ice/Heat Over  the counter pain medication Prescription pain medication Most patients will experience some swelling and bruising around the incisions.  Ice packs or heating pads (30-60 minutes up to 6 times a day) will help. Use ice for the first few days to help decrease swelling and bruising, then switch to heat to help relax tight/sore spots and speed recovery.  Some people prefer to use ice alone, heat alone, alternating between ice & heat.  Experiment to what works for you.  Swelling and bruising can take several weeks to resolve.   It is helpful to take an over-the-counter pain medication regularly for the first  few weeks.  Choose one of the following that works best for you: Naproxen (Aleve, etc)  Two 220mg  tabs twice a day Ibuprofen (Advil, etc) Three 200mg  tabs four times a day (every meal & bedtime) Acetaminophen (Tylenol, etc) 500-650mg  four times a day (every meal & bedtime) A  prescription for pain medication (such as oxycodone, hydrocodone, etc) should be given to you upon discharge.  Take your pain medication as prescribed.  If you are having problems/concerns with the prescription medicine (does not control pain, nausea, vomiting, rash, itching, etc), please call us [phone] to see if we need to switch you to a different pain medicine that will work better for you and/or control your side effect better. If you need a refill on your pain medication, please contact your pharmacy.  They will contact our office to request authorization. Prescriptions will not be filled after 5 pm or on week-ends. Avoid getting constipated.  Between the surgery and the pain medications, it is common to experience some constipation.  Increasing fluid intake and taking a fiber supplement (such as Metamucil, Citrucel, FiberCon, MiraLax, etc) 1-2 times a day regularly will usually help prevent this problem from occurring.  A mild laxative (prune juice, Milk of Magnesia, MiraLax, etc) should be taken according to package  directions if there are no bowel movements after 48 hours.   Watch out for diarrhea.  If you have many loose bowel movements, simplify your diet to bland foods & liquids for a few days.  Stop any stool softeners and decrease your fiber supplement.  Switching to mild anti-diarrheal medications (Kayopectate, Pepto Bismol) can help.  If this worsens or does not improve, please call us. Wash / shower every day.  You may shower over the incision / wound.  Avoid baths until the skin is fully healed.  Continue to shower over incision(s) after the dressing is off. Remove your waterproof bandages 5 days after surgery.  You may leave the incision open to air.  You may replace a dressing/Band-Aid to cover the incision for comfort if you wish. ACTIVITIES as tolerated:   You may resume regular (light) daily activities beginning the next day-such as daily self-care, walking, climbing stairs-gradually increasing activities as tolerated.  If you can walk 30 minutes without difficulty, it is safe to try more intense activity such as jogging, treadmill, bicycling, low-impact aerobics, swimming, etc. Save the most intensive and strenuous activity for last such as sit-ups, heavy lifting, contact sports, etc  Refrain from any heavy lifting or straining until you are off narcotics for pain control.   DO NOT PUSH THROUGH PAIN.  Let pain be your guide: If it hurts to do something, don't do it.  Pain is your body warning you to avoid that activity for another week until the pain goes down. You may drive when you are no longer taking prescription pain medication, you can comfortably wear a seatbelt, and you can safely maneuver your car and apply brakes. You may have sexual intercourse when it is comfortable.  FOLLOW UP in our office Please call [facility] at [phone] to set up an appointment to see your surgeon in the office for a follow-up appointment approximately 1-2 weeks after your surgery. Make sure that you call for this  appointment the day you arrive home to insure a convenient appointment time. 10. IF YOU HAVE DISABILITY OR FAMILY LEAVE FORMS, BRING THEM TO THE OFFICE FOR PROCESSING.  DO NOT GIVE THEM TO YOUR DOCTOR.   WHEN TO  CALL us [phone]: Poor pain control Reactions / problems with new medications (rash/itching, nausea, etc)  Fever over 101.5 F (38.5 C) Inability to urinate Nausea and/or vomiting Worsening swelling or bruising Continued bleeding from incision. Increased pain, redness, or drainage from the incision  The clinic staff is available to answer your questions during regular business hours (8:30am-5pm).  Please don't hesitate to call and ask to speak to one of our nurses for clinical concerns.   A surgeon from [facility] is always on call at the hospitals   If you have a medical emergency, go to the nearest emergency room or call 911.    [facility], [place], [place], [place], [place]  [address] ? MAIN: [phone] ? TOLL FREE: [phone] ? FAX [phone] [email or web address]   Follow with Primary MD LUKING,SCOTT, MD in 7 days   Get CBC, CMP, 2 view Chest X ray checked  by Primary MD next visit.    Activity: As tolerated with Full fall precautions use walker/cane & assistance as needed   Disposition Home     For Heart failure patients - Check your Weight same time everyday, if you gain over 2 pounds, or you develop in leg swelling, experience more shortness of breath or chest pain, call your Primary MD immediately. Follow Cardiac Low Salt Diet and 1.5 lit/day fluid restriction.   On your next visit with your primary care physician please Get Medicines reviewed and adjusted.   Please request your Prim.MD to go over all Hospital Tests and Procedure/Radiological results at the follow up, please get all Hospital records sent to your Prim MD by signing hospital release before you go home.   If you  experience worsening of your admission symptoms, develop shortness of breath, life threatening emergency, suicidal or homicidal thoughts you must seek medical attention immediately by calling 911 or calling your MD immediately  if symptoms less severe.  You Must read complete instructions/literature along with all the possible adverse reactions/side effects for all the Medicines you take and that have been prescribed to you. Take any new Medicines after you have completely understood and accpet all the possible adverse reactions/side effects.   Do not drive, operating heavy machinery, perform activities at heights, swimming or participation in water activities or provide baby sitting services if your were admitted for syncope or siezures until you have seen by Primary MD or a Neurologist and advised to do so again.  Do not drive when taking Pain medications.    Do not take more than prescribed Pain, Sleep and Anxiety Medications  Special Instructions: If you have smoked or chewed Tobacco  in the last 2 yrs please stop smoking, stop any regular Alcohol  and or any Recreational drug use.  Wear Seat belts while driving.   Please note  You were cared for by a hospitalist during your hospital stay. If you have any questions about your discharge medications or the care you received while you were in the hospital after you are discharged, you can call the unit and asked to speak with the hospitalist on call if the hospitalist that took care of you is not available. Once you are discharged, your primary care physician will handle any further medical issues. Please note that NO REFILLS for any discharge medications will be authorized once you are discharged, as it is imperative that you return to your primary care physician (or establish a relationship with a primary care physician if you  do not have one) for your aftercare needs so that they can reassess your need for medications and monitor your lab  values.     Increase activity slowly    Complete by:  As directed             Medication List    STOP taking these medications        EXCEDRIN PO     ranitidine 150 MG capsule  Commonly known as:  ZANTAC      TAKE these medications        albuterol 108 (90 BASE) MCG/ACT inhaler  Commonly known as:  PROVENTIL HFA;VENTOLIN HFA  Inhale 2 puffs into the lungs every 6 (six) hours as needed for wheezing or shortness of breath.     amitriptyline 50 MG tablet  Commonly known as:  ELAVIL  TAKE 1 TABLET (50 MG TOTAL) BY MOUTH AT BEDTIME.     clonazePAM 1 MG tablet  Commonly known as:  KLONOPIN  1/2-1 po BID prn panic attacks     docusate sodium 100 MG capsule  Commonly known as:  COLACE  Take 2 capsules (200 mg total) by mouth 2 (two) times daily as needed for mild constipation.     ferrous sulfate 325 (65 FE) MG tablet  Take 1 tablet (325 mg total) by mouth 2 (two) times daily with a meal.     hydrochlorothiazide 25 MG tablet  Commonly known as:  HYDRODIURIL  Take 25 mg by mouth daily.     levothyroxine 50 MCG tablet  Commonly known as:  SYNTHROID, LEVOTHROID  TAKE 1 TABLET EVERY DAY     nicotine 14 mg/24hr patch  Commonly known as:  NICODERM CQ - dosed in mg/24 hours  Place 1 patch (14 mg total) onto the skin daily.     oxyCODONE-acetaminophen 10-325 MG per tablet  Commonly known as:  PERCOCET  Take 1 tablet by mouth every 6 (six) hours as needed for pain.     pantoprazole 40 MG tablet  Commonly known as:  PROTONIX  Take 1 tablet (40 mg total) by mouth 2 (two) times daily.     sucralfate 1 GM/10ML suspension  Commonly known as:  CARAFATE  Take 10 mLs (1 g total) by mouth 4 (four) times daily -  with meals and at bedtime.     venlafaxine XR 150 MG 24 hr capsule  Commonly known as:  EFFEXOR-XR  Take 150 mg by mouth daily with breakfast.          Diet and Activity recommendation: See Discharge Instructions above   Procedures   2/26 Admit with syncope  prior to presentation, concern for UGIB. Hgb 8.2 from 15 2/28 No further bleeding, pt seeking pain meds, getting out of bed without assistance 2/28 Massive cratered duodenal bulb ulcer noted on EGD 3/6 Near syncopal episode w/ black stool , sharp drop in hbg and hypotension  3/7 Hg stable, transfer out of the ICU to tele 3/8 Hg unstable, transfer out of tele to the ICU 05-07-14 - Exploratory laparotomy, oversew of duodenal ulcer   Consults PCCM, GI, [facility]   Other Major procedures and Radiology Reports - PLEASE review detailed and final reports for all details, in brief -       Dg Chest 2 View  05/09/2014   CLINICAL DATA:  Cough and postnasal drip for a few days.  Weakness.  EXAM: CHEST  2 VIEW  COMPARISON:  None.  FINDINGS: A right PICC is present and  terminates near the cavoatrial junction. An enteric tube loops in the upper abdomen over the proximal stomach with side hole well beyond the GE junction and tip not imaged. There is slight elevation of the right hemidiaphragm. Cardiomediastinal silhouette is within normal limits. The lungs are slightly hypoinflated without evidence of airspace consolidation, edema, pleural effusion, or pneumothorax. Upper abdominal skin staples and a likely surgical drain are noted. No acute osseous abnormality is identified.  IMPRESSION: No active cardiopulmonary disease.   Electronically Signed   By: Logan Bores   On: 05/09/2014 14:59   Ct Head Wo Contrast  04/29/2014   CLINICAL DATA:  Fall. Hit left-sided head on dry air. Headaches. Neck pain.  EXAM: CT HEAD WITHOUT CONTRAST  CT CERVICAL SPINE WITHOUT CONTRAST  TECHNIQUE: Multidetector CT imaging of the head and cervical spine was performed following the standard protocol without intravenous contrast. Multiplanar CT image reconstructions of the cervical spine were also generated.  COMPARISON:  None.  FINDINGS: CT HEAD FINDINGS  No acute cortical infarct, hemorrhage, or mass lesion ispresent. Ventricles are of  normal size. No significant extra-axial fluid collection is present. The paranasal sinuses andmastoid air cells are clear. The osseous skull is intact.  CT CERVICAL SPINE FINDINGS  There is reversal of normal cervical lordosis which may reflect muscle spasm or patient positioning. The vertebral body heights are well preserved. The facet joints are all well aligned. The prevertebral soft tissue space is normal.  IMPRESSION: 1. No acute intracranial abnormalities. 2. Reversal of normal cervical lordosis which may reflect muscle spasm or patient positioning.   Electronically Signed   By: Kerby Moors M.D.   On: 04/29/2014 16:00   Ct Cervical Spine Wo Contrast  04/29/2014   CLINICAL DATA:  Fall. Hit left-sided head on dry air. Headaches. Neck pain.  EXAM: CT HEAD WITHOUT CONTRAST  CT CERVICAL SPINE WITHOUT CONTRAST  TECHNIQUE: Multidetector CT imaging of the head and cervical spine was performed following the standard protocol without intravenous contrast. Multiplanar CT image reconstructions of the cervical spine were also generated.  COMPARISON:  None.  FINDINGS: CT HEAD FINDINGS  No acute cortical infarct, hemorrhage, or mass lesion ispresent. Ventricles are of normal size. No significant extra-axial fluid collection is present. The paranasal sinuses andmastoid air cells are clear. The osseous skull is intact.  CT CERVICAL SPINE FINDINGS  There is reversal of normal cervical lordosis which may reflect muscle spasm or patient positioning. The vertebral body heights are well preserved. The facet joints are all well aligned. The prevertebral soft tissue space is normal.  IMPRESSION: 1. No acute intracranial abnormalities. 2. Reversal of normal cervical lordosis which may reflect muscle spasm or patient positioning.   Electronically Signed   By: Kerby Moors M.D.   On: 04/29/2014 16:00   Ir Angiogram Visceral Selective  05/05/2014   INDICATION: 34 year old female admitted to the hospital with upper GI bleeding  and hematemesis.  She was admitted April 26, 2013. Upper endoscopy demonstrates duodenum ulcer.  She has been stable over 1 week, though developed syncopal episode and acute anemia with a hemoglobin of 5.7 on today's date. She has been referred for evaluation and treatment of the duodenum ulcer hemorrhage.  EXAM: 1. AORTIC ANGIOGRAM 2. SUPERIOR MESENTERIC ARTERIOGRAM 3. SUB SELECTION OF FOUR TERTIARY BRANCHES FROM THE SUPERIOR MESENTERIC ARTERY 4. ULTRASOUND GUIDED VASCULAR ACCESS OF THE RIGHT COMMON FEMORAL ARTERY 5. MANUAL PRESSURE FOR HEMOSTASIS.  COMPARISON:  CT abdomen pelvis, 04/27/2013, 09/22/2001  MEDICATIONS: Versed 2.0 mg IV; Fentanyl  100 mcg IV  CONTRAST:  158m OMNIPAQUE IOHEXOL 300 MG/ML  SOLN  ANESTHESIA/SEDATION: One hundred fifty minutes  FLUOROSCOPY TIME:  Forty-one minutes.  Fifty-four seconds.  2941 mGy  ACCESS: Right common femoral artery; hemostasis achieved with manual compression.  COMPLICATIONS: None immediate  TECHNIQUE: Informed written consent was obtained from the patient after a discussion of the risks, benefits and alternatives to treatment. Questions regarding the procedure were encouraged and answered. A timeout was performed prior to the initiation of the procedure.  The right groin was prepped and draped in the usual sterile fashion, and a sterile drape was applied covering the operative field. Maximum barrier sterile technique with sterile gowns and gloves were used for the procedure. A timeout was performed prior to the initiation of the procedure. Local anesthesia was provided with 1% lidocaine.  Physical exam was performed to identify the location of the right common femoral artery. Ultrasound survey was performed with images stored sent to PACs.  Once the common femoral artery was identified under ultrasound, ultrasound guidance was used to generously infiltrate the skin and subcutaneous tissues with 1% lidocaine without epinephrine. A micropuncture access was then used to  puncture the right common femoral artery. With excellent blood flow returned, and micro wire was advanced through the needle observed under fluoroscopy to enter the iliac system. The needle was removed from wire a micropuncture kit was advanced over the wire. The inner dilator and cannula were removed with a wire, and an 035 Bentson wire was advanced into the abdominal aorta under fluoroscopy the micro puncture was removed, and a 5 French sheath was advanced over the Bentson wire into the common femoral artery. The dilator was removed and the sheath was flushed.  A pigtail Flush catheter was advanced over the Bentson wire into the lower thoracic aorta. Aortic flush angiogram performed.  Pigtail catheter was then exchanged over wire for C2 Cobra catheter which was used to select the superior mesenteric artery. Superior mesenteric artery angiogram was performed.  Subsequently, a combination of the micro catheter a micro wire were used to select various branches of superior mesenteric artery in this patient with a celiacomesenteric trunk. A standard renegade micro catheter as well as a soft synchro an a standard .016 synchro wire were used to select branches of the superior mesenteric artery. Sub selection included:  Replaced common hepatic artery.  Right hepatic artery.  New middle colicky artery.  Proximal jejunal branches.  Two base catheter exchanges were made on the Bentson wire including exchange for Mickelson catheter and exchanged for a C2 Cobra catheter for formation of a Waltman loop.  After dedicated angiogram of each of the sub selected arteries, all catheters and wires were removed.  Angiogram through the sheath was performed with the common femoral artery.  Deployment of an Exoseal device for hemostasis failed without deployment. Manual pressure was used for hemostasis of the right common femoral artery.  Patient tolerated procedure well and remained hemodynamically stable throughout. At the end of the  procedure, the patient's vital signs were within normal range. Systolic brought pressure 110. Heart rate of 80-90. No syncopal events, no altered mental status, no hematemesis, and no hematochezia/melena.  .Marland Kitchen FINDINGS: Aortic angiogram:  Normal course caliber and contour of the abdominal aorta. Multiple segmental vessels fill within the lower thoracic spine and lumbar spine. No aneurysm dissection flap identified. No significant atherosclerotic changes.  Steep angled take-off of the bilateral common iliac arteries.  Single bilateral renal arteries.  No celiac artery  origin.  Superior mesenteric artery patent, with configuration of a celiacomesenteric trunk.  Late phase of the aortic rind demonstrates no evidence of bleeding.  Celiacomesenteric angiogram:  Normal caliber and contour of the origin of the trunk. Multiple jejunal branches present. Normal caliber and contour of the ileocolic artery. Multiple tortuous vessels overlie the proximal 3rd of the trunk, with evidence of a replaced common hepatic artery contributing to both right and left hepatic arteries. There is a replaced vessel representing a celiac vessel, with cross-filling at a bifurcation to the splenic artery and the hepatic arteries. Patent left gastric artery. New  Late phase of the trunk injection demonstrates no evidence of bleeding. No tumor blush or pooling of contrast. Patent portal venous system identified.  Common hepatic artery injection:  Replaced common hepatic artery with right hepatic arteries and left hepatic arteries from the replaced trunk. No evidence of tumor blush, aneurysm, dissection flap, pulling of contrast. No abnormal enhancement identified. No pseudoaneurysm identified.  No call it artery injection:  Patent middle call it artery of normal caliber. Significant cross flow from collateral vessels with no pooling of contrast, pseudoaneurysm, or tumor blush.  Proximal jejunal arcade:  Normal course caliber and contour. Normal  blush of the proximal jejunal mucosa. No filling defects, or pseudoaneurysm.  No embolization was performed.  IMPRESSION: Status post diagnostic mesenteric angiogram.  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  No bleeding was identified from the replaced common hepatic artery, which would be the best candidate for supplying the pancreaticoduodenal arteries near the known proximal duodenum ulcer. Because no bleeding was identified of this variant anatomy, no empiric embolization was performed.  Signed,  Dulcy Fanny. Earleen Newport, DO  [facility]  [facility]  PLAN: The patient will require 6 hours flattened bed with log roll only for hemostasis of the right common femoral artery after manual compression.  Agree with ICU management, including serial hemoglobin and hematocrit checks.   Electronically Signed   By: Corrie Mckusick D.O.   On: 05/05/2014 16:37   Ir Angiogram Selective Each Additional Vessel  05/05/2014   INDICATION: 34 year old female admitted to the hospital with upper GI bleeding and hematemesis.  She was admitted April 26, 2013. Upper endoscopy demonstrates duodenum ulcer.  She has been stable over 1 week, though developed syncopal episode and acute anemia with a hemoglobin of 5.7 on today's date. She has been referred for evaluation and treatment of the duodenum ulcer hemorrhage.  EXAM: 1. AORTIC ANGIOGRAM 2. SUPERIOR MESENTERIC ARTERIOGRAM 3. SUB SELECTION OF FOUR TERTIARY BRANCHES FROM THE SUPERIOR MESENTERIC ARTERY 4. ULTRASOUND GUIDED VASCULAR ACCESS OF THE RIGHT COMMON FEMORAL ARTERY 5. MANUAL PRESSURE FOR HEMOSTASIS.  COMPARISON:  CT abdomen pelvis, 04/27/2013, 09/22/2001  MEDICATIONS: Versed 2.0 mg IV; Fentanyl 100 mcg IV  CONTRAST:  137m OMNIPAQUE IOHEXOL 300 MG/ML  SOLN  ANESTHESIA/SEDATION: One hundred fifty minutes  FLUOROSCOPY TIME:  Forty-one minutes.  Fifty-four seconds.  2941  mGy  ACCESS: Right common femoral artery; hemostasis achieved with manual compression.  COMPLICATIONS: None immediate  TECHNIQUE: Informed written consent was obtained from the patient after a discussion of the risks, benefits and alternatives to treatment. Questions regarding the procedure were encouraged and answered. A timeout was performed prior to the initiation of the procedure.  The right groin was prepped and draped in the usual sterile fashion, and a sterile drape was applied covering the operative field. Maximum barrier sterile technique with sterile gowns and  gloves were used for the procedure. A timeout was performed prior to the initiation of the procedure. Local anesthesia was provided with 1% lidocaine.  Physical exam was performed to identify the location of the right common femoral artery. Ultrasound survey was performed with images stored sent to PACs.  Once the common femoral artery was identified under ultrasound, ultrasound guidance was used to generously infiltrate the skin and subcutaneous tissues with 1% lidocaine without epinephrine. A micropuncture access was then used to puncture the right common femoral artery. With excellent blood flow returned, and micro wire was advanced through the needle observed under fluoroscopy to enter the iliac system. The needle was removed from wire a micropuncture kit was advanced over the wire. The inner dilator and cannula were removed with a wire, and an 035 Bentson wire was advanced into the abdominal aorta under fluoroscopy the micro puncture was removed, and a 5 French sheath was advanced over the Bentson wire into the common femoral artery. The dilator was removed and the sheath was flushed.  A pigtail Flush catheter was advanced over the Bentson wire into the lower thoracic aorta. Aortic flush angiogram performed.  Pigtail catheter was then exchanged over wire for C2 Cobra catheter which was used to select the superior mesenteric artery. Superior  mesenteric artery angiogram was performed.  Subsequently, a combination of the micro catheter a micro wire were used to select various branches of superior mesenteric artery in this patient with a celiacomesenteric trunk. A standard renegade micro catheter as well as a soft synchro an a standard .016 synchro wire were used to select branches of the superior mesenteric artery. Sub selection included:  Replaced common hepatic artery.  Right hepatic artery.  New middle colicky artery.  Proximal jejunal branches.  Two base catheter exchanges were made on the Bentson wire including exchange for Mickelson catheter and exchanged for a C2 Cobra catheter for formation of a Waltman loop.  After dedicated angiogram of each of the sub selected arteries, all catheters and wires were removed.  Angiogram through the sheath was performed with the common femoral artery.  Deployment of an Exoseal device for hemostasis failed without deployment. Manual pressure was used for hemostasis of the right common femoral artery.  Patient tolerated procedure well and remained hemodynamically stable throughout. At the end of the procedure, the patient's vital signs were within normal range. Systolic brought pressure 110. Heart rate of 80-90. No syncopal events, no altered mental status, no hematemesis, and no hematochezia/melena.  Marland Kitchen  FINDINGS: Aortic angiogram:  Normal course caliber and contour of the abdominal aorta. Multiple segmental vessels fill within the lower thoracic spine and lumbar spine. No aneurysm dissection flap identified. No significant atherosclerotic changes.  Steep angled take-off of the bilateral common iliac arteries.  Single bilateral renal arteries.  No celiac artery origin.  Superior mesenteric artery patent, with configuration of a celiacomesenteric trunk.  Late phase of the aortic rind demonstrates no evidence of bleeding.  Celiacomesenteric angiogram:  Normal caliber and contour of the origin of the trunk. Multiple  jejunal branches present. Normal caliber and contour of the ileocolic artery. Multiple tortuous vessels overlie the proximal 3rd of the trunk, with evidence of a replaced common hepatic artery contributing to both right and left hepatic arteries. There is a replaced vessel representing a celiac vessel, with cross-filling at a bifurcation to the splenic artery and the hepatic arteries. Patent left gastric artery. New  Late phase of the trunk injection demonstrates no evidence of bleeding. No tumor  blush or pooling of contrast. Patent portal venous system identified.  Common hepatic artery injection:  Replaced common hepatic artery with right hepatic arteries and left hepatic arteries from the replaced trunk. No evidence of tumor blush, aneurysm, dissection flap, pulling of contrast. No abnormal enhancement identified. No pseudoaneurysm identified.  No call it artery injection:  Patent middle call it artery of normal caliber. Significant cross flow from collateral vessels with no pooling of contrast, pseudoaneurysm, or tumor blush.  Proximal jejunal arcade:  Normal course caliber and contour. Normal blush of the proximal jejunal mucosa. No filling defects, or pseudoaneurysm.  No embolization was performed.  IMPRESSION: Status post diagnostic mesenteric angiogram.  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  No bleeding was identified from the replaced common hepatic artery, which would be the best candidate for supplying the pancreaticoduodenal arteries near the known proximal duodenum ulcer. Because no bleeding was identified of this variant anatomy, no empiric embolization was performed.  Signed,  Dulcy Fanny. Earleen Newport, DO  [facility]  [facility]  PLAN: The patient will require 6 hours flattened bed with log roll only for hemostasis of the right common femoral artery after manual compression.  Agree with  ICU management, including serial hemoglobin and hematocrit checks.   Electronically Signed   By: Corrie Mckusick D.O.   On: 05/05/2014 16:37   Ir Angiogram Selective Each Additional Vessel  05/05/2014   INDICATION: 34 year old female admitted to the hospital with upper GI bleeding and hematemesis.  She was admitted April 26, 2013. Upper endoscopy demonstrates duodenum ulcer.  She has been stable over 1 week, though developed syncopal episode and acute anemia with a hemoglobin of 5.7 on today's date. She has been referred for evaluation and treatment of the duodenum ulcer hemorrhage.  EXAM: 1. AORTIC ANGIOGRAM 2. SUPERIOR MESENTERIC ARTERIOGRAM 3. SUB SELECTION OF FOUR TERTIARY BRANCHES FROM THE SUPERIOR MESENTERIC ARTERY 4. ULTRASOUND GUIDED VASCULAR ACCESS OF THE RIGHT COMMON FEMORAL ARTERY 5. MANUAL PRESSURE FOR HEMOSTASIS.  COMPARISON:  CT abdomen pelvis, 04/27/2013, 09/22/2001  MEDICATIONS: Versed 2.0 mg IV; Fentanyl 100 mcg IV  CONTRAST:  125m OMNIPAQUE IOHEXOL 300 MG/ML  SOLN  ANESTHESIA/SEDATION: One hundred fifty minutes  FLUOROSCOPY TIME:  Forty-one minutes.  Fifty-four seconds.  2941 mGy  ACCESS: Right common femoral artery; hemostasis achieved with manual compression.  COMPLICATIONS: None immediate  TECHNIQUE: Informed written consent was obtained from the patient after a discussion of the risks, benefits and alternatives to treatment. Questions regarding the procedure were encouraged and answered. A timeout was performed prior to the initiation of the procedure.  The right groin was prepped and draped in the usual sterile fashion, and a sterile drape was applied covering the operative field. Maximum barrier sterile technique with sterile gowns and gloves were used for the procedure. A timeout was performed prior to the initiation of the procedure. Local anesthesia was provided with 1% lidocaine.  Physical exam was performed to identify the location of the right common femoral artery. Ultrasound  survey was performed with images stored sent to PACs.  Once the common femoral artery was identified under ultrasound, ultrasound guidance was used to generously infiltrate the skin and subcutaneous tissues with 1% lidocaine without epinephrine. A micropuncture access was then used to puncture the right common femoral artery. With excellent blood flow returned, and micro wire was advanced through the needle observed under fluoroscopy to enter the iliac system. The needle was removed from wire a  micropuncture kit was advanced over the wire. The inner dilator and cannula were removed with a wire, and an 035 Bentson wire was advanced into the abdominal aorta under fluoroscopy the micro puncture was removed, and a 5 French sheath was advanced over the Bentson wire into the common femoral artery. The dilator was removed and the sheath was flushed.  A pigtail Flush catheter was advanced over the Bentson wire into the lower thoracic aorta. Aortic flush angiogram performed.  Pigtail catheter was then exchanged over wire for C2 Cobra catheter which was used to select the superior mesenteric artery. Superior mesenteric artery angiogram was performed.  Subsequently, a combination of the micro catheter a micro wire were used to select various branches of superior mesenteric artery in this patient with a celiacomesenteric trunk. A standard renegade micro catheter as well as a soft synchro an a standard .016 synchro wire were used to select branches of the superior mesenteric artery. Sub selection included:  Replaced common hepatic artery.  Right hepatic artery.  New middle colicky artery.  Proximal jejunal branches.  Two base catheter exchanges were made on the Bentson wire including exchange for Mickelson catheter and exchanged for a C2 Cobra catheter for formation of a Waltman loop.  After dedicated angiogram of each of the sub selected arteries, all catheters and wires were removed.  Angiogram through the sheath was performed  with the common femoral artery.  Deployment of an Exoseal device for hemostasis failed without deployment. Manual pressure was used for hemostasis of the right common femoral artery.  Patient tolerated procedure well and remained hemodynamically stable throughout. At the end of the procedure, the patient's vital signs were within normal range. Systolic brought pressure 110. Heart rate of 80-90. No syncopal events, no altered mental status, no hematemesis, and no hematochezia/melena.  Marland Kitchen  FINDINGS: Aortic angiogram:  Normal course caliber and contour of the abdominal aorta. Multiple segmental vessels fill within the lower thoracic spine and lumbar spine. No aneurysm dissection flap identified. No significant atherosclerotic changes.  Steep angled take-off of the bilateral common iliac arteries.  Single bilateral renal arteries.  No celiac artery origin.  Superior mesenteric artery patent, with configuration of a celiacomesenteric trunk.  Late phase of the aortic rind demonstrates no evidence of bleeding.  Celiacomesenteric angiogram:  Normal caliber and contour of the origin of the trunk. Multiple jejunal branches present. Normal caliber and contour of the ileocolic artery. Multiple tortuous vessels overlie the proximal 3rd of the trunk, with evidence of a replaced common hepatic artery contributing to both right and left hepatic arteries. There is a replaced vessel representing a celiac vessel, with cross-filling at a bifurcation to the splenic artery and the hepatic arteries. Patent left gastric artery. New  Late phase of the trunk injection demonstrates no evidence of bleeding. No tumor blush or pooling of contrast. Patent portal venous system identified.  Common hepatic artery injection:  Replaced common hepatic artery with right hepatic arteries and left hepatic arteries from the replaced trunk. No evidence of tumor blush, aneurysm, dissection flap, pulling of contrast. No abnormal enhancement identified. No  pseudoaneurysm identified.  No call it artery injection:  Patent middle call it artery of normal caliber. Significant cross flow from collateral vessels with no pooling of contrast, pseudoaneurysm, or tumor blush.  Proximal jejunal arcade:  Normal course caliber and contour. Normal blush of the proximal jejunal mucosa. No filling defects, or pseudoaneurysm.  No embolization was performed.  IMPRESSION: Status post diagnostic mesenteric angiogram.  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  No bleeding was identified from the replaced common hepatic artery, which would be the best candidate for supplying the pancreaticoduodenal arteries near the known proximal duodenum ulcer. Because no bleeding was identified of this variant anatomy, no empiric embolization was performed.  Signed,  Dulcy Fanny. Earleen Newport, DO  [facility]  [facility]  PLAN: The patient will require 6 hours flattened bed with log roll only for hemostasis of the right common femoral artery after manual compression.  Agree with ICU management, including serial hemoglobin and hematocrit checks.   Electronically Signed   By: Corrie Mckusick D.O.   On: 05/05/2014 16:37   Ir Angiogram Selective Each Additional Vessel  05/05/2014   INDICATION: 34 year old female admitted to the hospital with upper GI bleeding and hematemesis.  She was admitted April 26, 2013. Upper endoscopy demonstrates duodenum ulcer.  She has been stable over 1 week, though developed syncopal episode and acute anemia with a hemoglobin of 5.7 on today's date. She has been referred for evaluation and treatment of the duodenum ulcer hemorrhage.  EXAM: 1. AORTIC ANGIOGRAM 2. SUPERIOR MESENTERIC ARTERIOGRAM 3. SUB SELECTION OF FOUR TERTIARY BRANCHES FROM THE SUPERIOR MESENTERIC ARTERY 4. ULTRASOUND GUIDED VASCULAR ACCESS OF THE RIGHT COMMON FEMORAL ARTERY 5. MANUAL PRESSURE FOR  HEMOSTASIS.  COMPARISON:  CT abdomen pelvis, 04/27/2013, 09/22/2001  MEDICATIONS: Versed 2.0 mg IV; Fentanyl 100 mcg IV  CONTRAST:  156m OMNIPAQUE IOHEXOL 300 MG/ML  SOLN  ANESTHESIA/SEDATION: One hundred fifty minutes  FLUOROSCOPY TIME:  Forty-one minutes.  Fifty-four seconds.  2941 mGy  ACCESS: Right common femoral artery; hemostasis achieved with manual compression.  COMPLICATIONS: None immediate  TECHNIQUE: Informed written consent was obtained from the patient after a discussion of the risks, benefits and alternatives to treatment. Questions regarding the procedure were encouraged and answered. A timeout was performed prior to the initiation of the procedure.  The right groin was prepped and draped in the usual sterile fashion, and a sterile drape was applied covering the operative field. Maximum barrier sterile technique with sterile gowns and gloves were used for the procedure. A timeout was performed prior to the initiation of the procedure. Local anesthesia was provided with 1% lidocaine.  Physical exam was performed to identify the location of the right common femoral artery. Ultrasound survey was performed with images stored sent to PACs.  Once the common femoral artery was identified under ultrasound, ultrasound guidance was used to generously infiltrate the skin and subcutaneous tissues with 1% lidocaine without epinephrine. A micropuncture access was then used to puncture the right common femoral artery. With excellent blood flow returned, and micro wire was advanced through the needle observed under fluoroscopy to enter the iliac system. The needle was removed from wire a micropuncture kit was advanced over the wire. The inner dilator and cannula were removed with a wire, and an 035 Bentson wire was advanced into the abdominal aorta under fluoroscopy the micro puncture was removed, and a 5 French sheath was advanced over the Bentson wire into the common femoral artery. The dilator was removed and the  sheath was flushed.  A pigtail Flush catheter was advanced over the Bentson wire into the lower thoracic aorta. Aortic flush angiogram performed.  Pigtail catheter was then exchanged over wire for C2 Cobra catheter which was used to select the superior mesenteric artery. Superior mesenteric artery angiogram was performed.  Subsequently, a combination of the micro catheter a micro  wire were used to select various branches of superior mesenteric artery in this patient with a celiacomesenteric trunk. A standard renegade micro catheter as well as a soft synchro an a standard .016 synchro wire were used to select branches of the superior mesenteric artery. Sub selection included:  Replaced common hepatic artery.  Right hepatic artery.  New middle colicky artery.  Proximal jejunal branches.  Two base catheter exchanges were made on the Bentson wire including exchange for Mickelson catheter and exchanged for a C2 Cobra catheter for formation of a Waltman loop.  After dedicated angiogram of each of the sub selected arteries, all catheters and wires were removed.  Angiogram through the sheath was performed with the common femoral artery.  Deployment of an Exoseal device for hemostasis failed without deployment. Manual pressure was used for hemostasis of the right common femoral artery.  Patient tolerated procedure well and remained hemodynamically stable throughout. At the end of the procedure, the patient's vital signs were within normal range. Systolic brought pressure 110. Heart rate of 80-90. No syncopal events, no altered mental status, no hematemesis, and no hematochezia/melena.  Marland Kitchen  FINDINGS: Aortic angiogram:  Normal course caliber and contour of the abdominal aorta. Multiple segmental vessels fill within the lower thoracic spine and lumbar spine. No aneurysm dissection flap identified. No significant atherosclerotic changes.  Steep angled take-off of the bilateral common iliac arteries.  Single bilateral renal  arteries.  No celiac artery origin.  Superior mesenteric artery patent, with configuration of a celiacomesenteric trunk.  Late phase of the aortic rind demonstrates no evidence of bleeding.  Celiacomesenteric angiogram:  Normal caliber and contour of the origin of the trunk. Multiple jejunal branches present. Normal caliber and contour of the ileocolic artery. Multiple tortuous vessels overlie the proximal 3rd of the trunk, with evidence of a replaced common hepatic artery contributing to both right and left hepatic arteries. There is a replaced vessel representing a celiac vessel, with cross-filling at a bifurcation to the splenic artery and the hepatic arteries. Patent left gastric artery. New  Late phase of the trunk injection demonstrates no evidence of bleeding. No tumor blush or pooling of contrast. Patent portal venous system identified.  Common hepatic artery injection:  Replaced common hepatic artery with right hepatic arteries and left hepatic arteries from the replaced trunk. No evidence of tumor blush, aneurysm, dissection flap, pulling of contrast. No abnormal enhancement identified. No pseudoaneurysm identified.  No call it artery injection:  Patent middle call it artery of normal caliber. Significant cross flow from collateral vessels with no pooling of contrast, pseudoaneurysm, or tumor blush.  Proximal jejunal arcade:  Normal course caliber and contour. Normal blush of the proximal jejunal mucosa. No filling defects, or pseudoaneurysm.  No embolization was performed.  IMPRESSION: Status post diagnostic mesenteric angiogram.  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  No bleeding was identified from the replaced common hepatic artery, which would be the best candidate for supplying the pancreaticoduodenal arteries near the known proximal duodenum ulcer. Because no bleeding was identified of this variant anatomy, no empiric  embolization was performed.  Signed,  Dulcy Fanny. Earleen Newport, DO  [facility]  [facility]  PLAN: The patient will require 6 hours flattened bed with log roll only for hemostasis of the right common femoral artery after manual compression.  Agree with ICU management, including serial hemoglobin and hematocrit checks.   Electronically Signed   By: Corrie Mckusick D.O.  On: 05/05/2014 16:37   Ir Angiogram Selective Each Additional Vessel  05/05/2014   INDICATION: 34 year old female admitted to the hospital with upper GI bleeding and hematemesis.  She was admitted April 26, 2013. Upper endoscopy demonstrates duodenum ulcer.  She has been stable over 1 week, though developed syncopal episode and acute anemia with a hemoglobin of 5.7 on today's date. She has been referred for evaluation and treatment of the duodenum ulcer hemorrhage.  EXAM: 1. AORTIC ANGIOGRAM 2. SUPERIOR MESENTERIC ARTERIOGRAM 3. SUB SELECTION OF FOUR TERTIARY BRANCHES FROM THE SUPERIOR MESENTERIC ARTERY 4. ULTRASOUND GUIDED VASCULAR ACCESS OF THE RIGHT COMMON FEMORAL ARTERY 5. MANUAL PRESSURE FOR HEMOSTASIS.  COMPARISON:  CT abdomen pelvis, 04/27/2013, 09/22/2001  MEDICATIONS: Versed 2.0 mg IV; Fentanyl 100 mcg IV  CONTRAST:  173m OMNIPAQUE IOHEXOL 300 MG/ML  SOLN  ANESTHESIA/SEDATION: One hundred fifty minutes  FLUOROSCOPY TIME:  Forty-one minutes.  Fifty-four seconds.  2941 mGy  ACCESS: Right common femoral artery; hemostasis achieved with manual compression.  COMPLICATIONS: None immediate  TECHNIQUE: Informed written consent was obtained from the patient after a discussion of the risks, benefits and alternatives to treatment. Questions regarding the procedure were encouraged and answered. A timeout was performed prior to the initiation of the procedure.  The right groin was prepped and draped in the usual sterile fashion, and a sterile drape was applied covering the operative field. Maximum barrier  sterile technique with sterile gowns and gloves were used for the procedure. A timeout was performed prior to the initiation of the procedure. Local anesthesia was provided with 1% lidocaine.  Physical exam was performed to identify the location of the right common femoral artery. Ultrasound survey was performed with images stored sent to PACs.  Once the common femoral artery was identified under ultrasound, ultrasound guidance was used to generously infiltrate the skin and subcutaneous tissues with 1% lidocaine without epinephrine. A micropuncture access was then used to puncture the right common femoral artery. With excellent blood flow returned, and micro wire was advanced through the needle observed under fluoroscopy to enter the iliac system. The needle was removed from wire a micropuncture kit was advanced over the wire. The inner dilator and cannula were removed with a wire, and an 035 Bentson wire was advanced into the abdominal aorta under fluoroscopy the micro puncture was removed, and a 5 French sheath was advanced over the Bentson wire into the common femoral artery. The dilator was removed and the sheath was flushed.  A pigtail Flush catheter was advanced over the Bentson wire into the lower thoracic aorta. Aortic flush angiogram performed.  Pigtail catheter was then exchanged over wire for C2 Cobra catheter which was used to select the superior mesenteric artery. Superior mesenteric artery angiogram was performed.  Subsequently, a combination of the micro catheter a micro wire were used to select various branches of superior mesenteric artery in this patient with a celiacomesenteric trunk. A standard renegade micro catheter as well as a soft synchro an a standard .016 synchro wire were used to select branches of the superior mesenteric artery. Sub selection included:  Replaced common hepatic artery.  Right hepatic artery.  New middle colicky artery.  Proximal jejunal branches.  Two base catheter  exchanges were made on the Bentson wire including exchange for Mickelson catheter and exchanged for a C2 Cobra catheter for formation of a Waltman loop.  After dedicated angiogram of each of the sub selected arteries, all catheters and wires were removed.  Angiogram through the sheath was performed  with the common femoral artery.  Deployment of an Exoseal device for hemostasis failed without deployment. Manual pressure was used for hemostasis of the right common femoral artery.  Patient tolerated procedure well and remained hemodynamically stable throughout. At the end of the procedure, the patient's vital signs were within normal range. Systolic brought pressure 110. Heart rate of 80-90. No syncopal events, no altered mental status, no hematemesis, and no hematochezia/melena.  Marland Kitchen  FINDINGS: Aortic angiogram:  Normal course caliber and contour of the abdominal aorta. Multiple segmental vessels fill within the lower thoracic spine and lumbar spine. No aneurysm dissection flap identified. No significant atherosclerotic changes.  Steep angled take-off of the bilateral common iliac arteries.  Single bilateral renal arteries.  No celiac artery origin.  Superior mesenteric artery patent, with configuration of a celiacomesenteric trunk.  Late phase of the aortic rind demonstrates no evidence of bleeding.  Celiacomesenteric angiogram:  Normal caliber and contour of the origin of the trunk. Multiple jejunal branches present. Normal caliber and contour of the ileocolic artery. Multiple tortuous vessels overlie the proximal 3rd of the trunk, with evidence of a replaced common hepatic artery contributing to both right and left hepatic arteries. There is a replaced vessel representing a celiac vessel, with cross-filling at a bifurcation to the splenic artery and the hepatic arteries. Patent left gastric artery. New  Late phase of the trunk injection demonstrates no evidence of bleeding. No tumor blush or pooling of contrast.  Patent portal venous system identified.  Common hepatic artery injection:  Replaced common hepatic artery with right hepatic arteries and left hepatic arteries from the replaced trunk. No evidence of tumor blush, aneurysm, dissection flap, pulling of contrast. No abnormal enhancement identified. No pseudoaneurysm identified.  No call it artery injection:  Patent middle call it artery of normal caliber. Significant cross flow from collateral vessels with no pooling of contrast, pseudoaneurysm, or tumor blush.  Proximal jejunal arcade:  Normal course caliber and contour. Normal blush of the proximal jejunal mucosa. No filling defects, or pseudoaneurysm.  No embolization was performed.  IMPRESSION: Status post diagnostic mesenteric angiogram.  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  No bleeding was identified from the replaced common hepatic artery, which would be the best candidate for supplying the pancreaticoduodenal arteries near the known proximal duodenum ulcer. Because no bleeding was identified of this variant anatomy, no empiric embolization was performed.  Signed,  Dulcy Fanny. Earleen Newport, DO  [facility]  [facility]  PLAN: The patient will require 6 hours flattened bed with log roll only for hemostasis of the right common femoral artery after manual compression.  Agree with ICU management, including serial hemoglobin and hematocrit checks.   Electronically Signed   By: Corrie Mckusick D.O.   On: 05/05/2014 16:37   Ir US Guide Vasc Access Right  05/05/2014   INDICATION: 34 year old female admitted to the hospital with upper GI bleeding and hematemesis.  She was admitted April 26, 2013. Upper endoscopy demonstrates duodenum ulcer.  She has been stable over 1 week, though developed syncopal episode and acute anemia with a hemoglobin of 5.7 on today's date. She has been referred for evaluation  and treatment of the duodenum ulcer hemorrhage.  EXAM: 1. AORTIC ANGIOGRAM 2. SUPERIOR MESENTERIC ARTERIOGRAM 3. SUB SELECTION OF FOUR TERTIARY BRANCHES FROM THE SUPERIOR MESENTERIC ARTERY 4. ULTRASOUND GUIDED VASCULAR ACCESS OF THE RIGHT COMMON FEMORAL ARTERY 5. MANUAL PRESSURE FOR HEMOSTASIS.  COMPARISON:  CT  abdomen pelvis, 04/27/2013, 09/22/2001  MEDICATIONS: Versed 2.0 mg IV; Fentanyl 100 mcg IV  CONTRAST:  156m OMNIPAQUE IOHEXOL 300 MG/ML  SOLN  ANESTHESIA/SEDATION: One hundred fifty minutes  FLUOROSCOPY TIME:  Forty-one minutes.  Fifty-four seconds.  2941 mGy  ACCESS: Right common femoral artery; hemostasis achieved with manual compression.  COMPLICATIONS: None immediate  TECHNIQUE: Informed written consent was obtained from the patient after a discussion of the risks, benefits and alternatives to treatment. Questions regarding the procedure were encouraged and answered. A timeout was performed prior to the initiation of the procedure.  The right groin was prepped and draped in the usual sterile fashion, and a sterile drape was applied covering the operative field. Maximum barrier sterile technique with sterile gowns and gloves were used for the procedure. A timeout was performed prior to the initiation of the procedure. Local anesthesia was provided with 1% lidocaine.  Physical exam was performed to identify the location of the right common femoral artery. Ultrasound survey was performed with images stored sent to PACs.  Once the common femoral artery was identified under ultrasound, ultrasound guidance was used to generously infiltrate the skin and subcutaneous tissues with 1% lidocaine without epinephrine. A micropuncture access was then used to puncture the right common femoral artery. With excellent blood flow returned, and micro wire was advanced through the needle observed under fluoroscopy to enter the iliac system. The needle was removed from wire a micropuncture kit was advanced over the wire. The  inner dilator and cannula were removed with a wire, and an 035 Bentson wire was advanced into the abdominal aorta under fluoroscopy the micro puncture was removed, and a 5 French sheath was advanced over the Bentson wire into the common femoral artery. The dilator was removed and the sheath was flushed.  A pigtail Flush catheter was advanced over the Bentson wire into the lower thoracic aorta. Aortic flush angiogram performed.  Pigtail catheter was then exchanged over wire for C2 Cobra catheter which was used to select the superior mesenteric artery. Superior mesenteric artery angiogram was performed.  Subsequently, a combination of the micro catheter a micro wire were used to select various branches of superior mesenteric artery in this patient with a celiacomesenteric trunk. A standard renegade micro catheter as well as a soft synchro an a standard .016 synchro wire were used to select branches of the superior mesenteric artery. Sub selection included:  Replaced common hepatic artery.  Right hepatic artery.  New middle colicky artery.  Proximal jejunal branches.  Two base catheter exchanges were made on the Bentson wire including exchange for Mickelson catheter and exchanged for a C2 Cobra catheter for formation of a Waltman loop.  After dedicated angiogram of each of the sub selected arteries, all catheters and wires were removed.  Angiogram through the sheath was performed with the common femoral artery.  Deployment of an Exoseal device for hemostasis failed without deployment. Manual pressure was used for hemostasis of the right common femoral artery.  Patient tolerated procedure well and remained hemodynamically stable throughout. At the end of the procedure, the patient's vital signs were within normal range. Systolic brought pressure 110. Heart rate of 80-90. No syncopal events, no altered mental status, no hematemesis, and no hematochezia/melena.  .Marland Kitchen FINDINGS: Aortic angiogram:  Normal course caliber and  contour of the abdominal aorta. Multiple segmental vessels fill within the lower thoracic spine and lumbar spine. No aneurysm dissection flap identified. No significant atherosclerotic changes.  Steep angled take-off of the bilateral common  iliac arteries.  Single bilateral renal arteries.  No celiac artery origin.  Superior mesenteric artery patent, with configuration of a celiacomesenteric trunk.  Late phase of the aortic rind demonstrates no evidence of bleeding.  Celiacomesenteric angiogram:  Normal caliber and contour of the origin of the trunk. Multiple jejunal branches present. Normal caliber and contour of the ileocolic artery. Multiple tortuous vessels overlie the proximal 3rd of the trunk, with evidence of a replaced common hepatic artery contributing to both right and left hepatic arteries. There is a replaced vessel representing a celiac vessel, with cross-filling at a bifurcation to the splenic artery and the hepatic arteries. Patent left gastric artery. New  Late phase of the trunk injection demonstrates no evidence of bleeding. No tumor blush or pooling of contrast. Patent portal venous system identified.  Common hepatic artery injection:  Replaced common hepatic artery with right hepatic arteries and left hepatic arteries from the replaced trunk. No evidence of tumor blush, aneurysm, dissection flap, pulling of contrast. No abnormal enhancement identified. No pseudoaneurysm identified.  No call it artery injection:  Patent middle call it artery of normal caliber. Significant cross flow from collateral vessels with no pooling of contrast, pseudoaneurysm, or tumor blush.  Proximal jejunal arcade:  Normal course caliber and contour. Normal blush of the proximal jejunal mucosa. No filling defects, or pseudoaneurysm.  No embolization was performed.  IMPRESSION: Status post diagnostic mesenteric angiogram.  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  No bleeding was identified from the replaced common hepatic artery, which would be the best candidate for supplying the pancreaticoduodenal arteries near the known proximal duodenum ulcer. Because no bleeding was identified of this variant anatomy, no empiric embolization was performed.  Signed,  Dulcy Fanny. Earleen Newport, DO  [facility]  [facility]  PLAN: The patient will require 6 hours flattened bed with log roll only for hemostasis of the right common femoral artery after manual compression.  Agree with ICU management, including serial hemoglobin and hematocrit checks.   Electronically Signed   By: Corrie Mckusick D.O.   On: 05/05/2014 16:37    Micro Results     Recent Results (from the past 240 hour(s))  Urine culture     Status: None   Collection Time: 05/09/14 11:45 AM  Result Value Ref Range Status   Specimen Description URINE, CLEAN CATCH  Final   Special Requests NONE  Final   Colony Count NO GROWTH Performed at [facility]   Final   Culture NO GROWTH Performed at [facility]   Final   Report Status 05/10/2014 FINAL  Final  Urine culture     Status: None   Collection Time: 05/10/14  7:18 AM  Result Value Ref Range Status   Specimen Description URINE, RANDOM  Final   Special Requests NONE  Final   Colony Count NO GROWTH Performed at [facility]   Final   Culture NO GROWTH Performed at [facility]   Final   Report Status 05/11/2014 FINAL  Final       Today   Subjective:   Kimberly Weiss today has no headache,no chest abdominal pain,no new weakness tingling or numbness, feels much better wants to go home today.   Objective:   Blood pressure 96/53, pulse 98, temperature 98.6 F (37 C), temperature source Oral, resp. rate 18, height _0  (1.6 m), weight 69.536 kg (153 lb 4.8 oz), last menstrual period  04/12/2014, SpO2 97 %.   Intake/Output Summary (Last 24 hours) at  05/14/14 0909 Last data filed at 05/13/14 1858  Gross per 24 hour  Intake   1200 ml  Output      0 ml  Net   1200 ml    Exam Awake Alert, Oriented x 3, No new F.N deficits, Normal affect [place].AT,PERRAL Supple Neck,No JVD, No cervical lymphadenopathy appriciated.  Symmetrical Chest wall movement, Good air movement bilaterally, CTAB RRR,No Gallops,Rubs or new Murmurs, No Parasternal Heave +ve B.Sounds, Abd Soft, Non tender, No organomegaly appriciated, No rebound -guarding or rigidity. Abdominal incision site clean, staples and JP drain in place. Likely to be removed by surgery today. No Cyanosis, Clubbing or edema, No new Rash or bruise  Data Review   CBC w Diff: Lab Results  Component Value Date   WBC 5.6 05/13/2014   HGB 8.1* 05/14/2014   HGB 13.2 06/08/2012   HCT 25.2* 05/14/2014   PLT 368 05/13/2014   LYMPHOPCT 24 11/14/2013   MONOPCT 8 11/14/2013   EOSPCT 2 11/14/2013   BASOPCT 0 11/14/2013    CMP: Lab Results  Component Value Date   NA 139 05/13/2014   K 3.8 05/13/2014   CL 106 05/13/2014   CO2 27 05/13/2014   BUN <5* 05/13/2014   CREATININE 0.56 05/13/2014   PROT 4.6* 05/04/2014   ALBUMIN 2.3* 05/04/2014   BILITOT 0.5 05/04/2014   ALKPHOS 59 05/04/2014   AST 13 05/04/2014   ALT 11 05/04/2014  .   Total Time in preparing paper work, data evaluation and todays exam - 35 minutes  Thurnell Lose M.D on 05/14/2014 at 9:09 AM  [facility]   Office  [phone]

## 2014-05-16 ENCOUNTER — Encounter (HOSPITAL_COMMUNITY): Payer: Self-pay | Admitting: *Deleted

## 2014-05-16 ENCOUNTER — Inpatient Hospital Stay (HOSPITAL_COMMUNITY)
Admission: EM | Admit: 2014-05-16 | Discharge: 2014-05-20 | DRG: 391 | Disposition: A | Discharge status: Home / Self Care | Payer: Medicaid Other | Attending: Internal Medicine | Admitting: Internal Medicine

## 2014-05-16 DIAGNOSIS — R195 Other fecal abnormalities: Secondary | ICD-10-CM

## 2014-05-16 DIAGNOSIS — Z87442 Personal history of urinary calculi: Secondary | ICD-10-CM

## 2014-05-16 DIAGNOSIS — G8929 Other chronic pain: Secondary | ICD-10-CM | POA: Diagnosis present

## 2014-05-16 DIAGNOSIS — Z8711 Personal history of peptic ulcer disease: Secondary | ICD-10-CM

## 2014-05-16 DIAGNOSIS — K529 Noninfective gastroenteritis and colitis, unspecified: Principal | ICD-10-CM | POA: Diagnosis present

## 2014-05-16 DIAGNOSIS — R1084 Generalized abdominal pain: Secondary | ICD-10-CM

## 2014-05-16 DIAGNOSIS — N289 Disorder of kidney and ureter, unspecified: Secondary | ICD-10-CM | POA: Diagnosis present

## 2014-05-16 DIAGNOSIS — M549 Dorsalgia, unspecified: Secondary | ICD-10-CM | POA: Diagnosis present

## 2014-05-16 DIAGNOSIS — F1120 Opioid dependence, uncomplicated: Secondary | ICD-10-CM | POA: Diagnosis present

## 2014-05-16 DIAGNOSIS — K264 Chronic or unspecified duodenal ulcer with hemorrhage: Secondary | ICD-10-CM | POA: Diagnosis present

## 2014-05-16 DIAGNOSIS — Z888 Allergy status to other drugs, medicaments and biological substances status: Secondary | ICD-10-CM

## 2014-05-16 DIAGNOSIS — E039 Hypothyroidism, unspecified: Secondary | ICD-10-CM | POA: Diagnosis present

## 2014-05-16 DIAGNOSIS — R197 Diarrhea, unspecified: Secondary | ICD-10-CM | POA: Diagnosis present

## 2014-05-16 DIAGNOSIS — F1190 Opioid use, unspecified, uncomplicated: Secondary | ICD-10-CM

## 2014-05-16 DIAGNOSIS — F17210 Nicotine dependence, cigarettes, uncomplicated: Secondary | ICD-10-CM | POA: Diagnosis present

## 2014-05-16 DIAGNOSIS — R109 Unspecified abdominal pain: Secondary | ICD-10-CM

## 2014-05-16 DIAGNOSIS — R571 Hypovolemic shock: Secondary | ICD-10-CM | POA: Diagnosis present

## 2014-05-16 DIAGNOSIS — Z720 Tobacco use: Secondary | ICD-10-CM | POA: Diagnosis present

## 2014-05-16 DIAGNOSIS — I959 Hypotension, unspecified: Secondary | ICD-10-CM | POA: Diagnosis present

## 2014-05-16 HISTORY — DX: Gastrointestinal hemorrhage, unspecified: K92.2

## 2014-05-16 LAB — CBC WITH DIFFERENTIAL/PLATELET
Basophils Absolute: 0 10*3/uL (ref 0.0–0.1)
Basophils Relative: 0 % (ref 0–1)
Eosinophils Absolute: 0 10*3/uL (ref 0.0–0.7)
Eosinophils Relative: 0 % (ref 0–5)
HCT: 33.9 % — ABNORMAL LOW (ref 36.0–46.0)
Hemoglobin: 11.2 g/dL — ABNORMAL LOW (ref 12.0–15.0)
Lymphocytes Relative: 14 % (ref 12–46)
Lymphs Abs: 1.5 10*3/uL (ref 0.7–4.0)
MCH: 29.2 pg (ref 26.0–34.0)
MCHC: 33 g/dL (ref 30.0–36.0)
MCV: 88.5 fL (ref 78.0–100.0)
Monocytes Absolute: 1 10*3/uL (ref 0.1–1.0)
Monocytes Relative: 9 % (ref 3–12)
Neutro Abs: 8.3 10*3/uL — ABNORMAL HIGH (ref 1.7–7.7)
Neutrophils Relative %: 77 % (ref 43–77)
Platelets: 643 10*3/uL — ABNORMAL HIGH (ref 150–400)
RBC: 3.83 MIL/uL — ABNORMAL LOW (ref 3.87–5.11)
RDW: 14.3 % (ref 11.5–15.5)
WBC: 10.8 10*3/uL — ABNORMAL HIGH (ref 4.0–10.5)

## 2014-05-16 LAB — I-STAT CG4 LACTIC ACID, ED
Lactic Acid, Venous: 2.64 mmol/L (ref 0.5–2.0)
Lactic Acid, Venous: 1.38 mmol/L (ref 0.5–2.0)

## 2014-05-16 LAB — URINE MICROSCOPIC-ADD ON

## 2014-05-16 LAB — BASIC METABOLIC PANEL
Anion gap: 12 (ref 5–15)
BUN: 10 mg/dL (ref 6–23)
CO2: 22 mmol/L (ref 19–32)
Calcium: 9.2 mg/dL (ref 8.4–10.5)
Chloride: 104 mmol/L (ref 96–112)
Creatinine, Ser: 0.87 mg/dL (ref 0.50–1.10)
GFR calc Af Amer: >90 mL/min (ref >90)
GFR calc non Af Amer: 86 mL/min — ABNORMAL LOW (ref >90)
Glucose, Bld: 109 mg/dL — ABNORMAL HIGH (ref 70–99)
Potassium: 4.2 mmol/L (ref 3.5–5.1)
Sodium: 138 mmol/L (ref 135–145)

## 2014-05-16 LAB — URINALYSIS, ROUTINE W REFLEX MICROSCOPIC
Bilirubin Urine: NEGATIVE
Glucose, UA: NEGATIVE mg/dL
Ketones, ur: 40 mg/dL — AB
Nitrite: NEGATIVE
Protein, ur: NEGATIVE mg/dL
Specific Gravity, Urine: 1.021 (ref 1.005–1.030)
Urobilinogen, UA: 1 mg/dL (ref 0.0–1.0)
pH: 7.5 (ref 5.0–8.0)

## 2014-05-16 LAB — TYPE AND SCREEN
ABO/RH(D): O POS
Antibody Screen: NEGATIVE

## 2014-05-16 LAB — TSH: TSH: 1.069 u[IU]/mL (ref 0.350–4.500)

## 2014-05-16 LAB — CLOSTRIDIUM DIFFICILE BY PCR: Toxigenic C. Difficile by PCR: NEGATIVE

## 2014-05-16 LAB — POC OCCULT BLOOD, ED: Fecal Occult Bld: POSITIVE — AB

## 2014-05-16 MED ORDER — SODIUM CHLORIDE 0.9 % IV BOLUS (SEPSIS)
1000.0000 mL | Freq: Once | INTRAVENOUS | Status: AC
  Administered 2014-05-16: 1000 mL via INTRAVENOUS

## 2014-05-16 MED ORDER — PROMETHAZINE HCL 25 MG/ML IJ SOLN
12.5000 mg | Freq: Four times a day (QID) | INTRAMUSCULAR | Status: DC | PRN
  Administered 2014-05-16 – 2014-05-20 (×12): 12.5 mg via INTRAVENOUS
  Filled 2014-05-16 (×13): qty 1

## 2014-05-16 MED ORDER — OXYCODONE-ACETAMINOPHEN 10-325 MG PO TABS: 1.0000 | ORAL_TABLET | Freq: Four times a day (QID) | ORAL | Status: DC | PRN

## 2014-05-16 MED ORDER — SODIUM CHLORIDE 0.9 % IV SOLN
INTRAVENOUS | Status: AC
  Administered 2014-05-16: 125 mL/h via INTRAVENOUS
  Administered 2014-05-17: 03:00:00 via INTRAVENOUS

## 2014-05-16 MED ORDER — HYDROCODONE-ACETAMINOPHEN 5-325 MG PO TABS
1.0000 | ORAL_TABLET | ORAL | Status: DC | PRN
  Administered 2014-05-17: 2 via ORAL
  Filled 2014-05-16 (×2): qty 2

## 2014-05-16 MED ORDER — FENTANYL CITRATE 0.05 MG/ML IJ SOLN
50.0000 ug | Freq: Once | INTRAMUSCULAR | Status: AC
  Administered 2014-05-16: 50 ug via INTRAVENOUS
  Filled 2014-05-16: qty 2

## 2014-05-16 MED ORDER — METRONIDAZOLE IN NACL 5-0.79 MG/ML-% IV SOLN
500.0000 mg | Freq: Three times a day (TID) | INTRAVENOUS | Status: DC
  Filled 2014-05-16: qty 100

## 2014-05-16 MED ORDER — METRONIDAZOLE IN NACL 5-0.79 MG/ML-% IV SOLN
500.0000 mg | Freq: Three times a day (TID) | INTRAVENOUS | Status: DC
  Administered 2014-05-17 (×2): 500 mg via INTRAVENOUS
  Filled 2014-05-16 (×5): qty 100

## 2014-05-16 MED ORDER — OXYCODONE HCL 5 MG PO TABS
5.0000 mg | ORAL_TABLET | Freq: Four times a day (QID) | ORAL | Status: DC | PRN
  Administered 2014-05-16 – 2014-05-20 (×11): 5 mg via ORAL
  Filled 2014-05-16 (×12): qty 1

## 2014-05-16 MED ORDER — MORPHINE SULFATE 2 MG/ML IJ SOLN
2.0000 mg | INTRAMUSCULAR | Status: DC | PRN
  Administered 2014-05-16 – 2014-05-17 (×3): 2 mg via INTRAVENOUS
  Filled 2014-05-16 (×4): qty 1

## 2014-05-16 MED ORDER — CLONAZEPAM 0.5 MG PO TABS
0.2500 mg | ORAL_TABLET | Freq: Two times a day (BID) | ORAL | Status: DC
  Administered 2014-05-16 – 2014-05-20 (×7): 0.25 mg via ORAL
  Filled 2014-05-16 (×8): qty 1

## 2014-05-16 MED ORDER — VENLAFAXINE HCL ER 150 MG PO CP24
150.0000 mg | ORAL_CAPSULE | Freq: Every day | ORAL | Status: DC
  Administered 2014-05-17 – 2014-05-20 (×4): 150 mg via ORAL
  Filled 2014-05-16 (×5): qty 1

## 2014-05-16 MED ORDER — NICOTINE 14 MG/24HR TD PT24
14.0000 mg | MEDICATED_PATCH | Freq: Every day | TRANSDERMAL | Status: DC
  Filled 2014-05-16 (×5): qty 1

## 2014-05-16 MED ORDER — FERROUS SULFATE 325 (65 FE) MG PO TABS
325.0000 mg | ORAL_TABLET | Freq: Two times a day (BID) | ORAL | Status: DC
  Administered 2014-05-17 – 2014-05-19 (×5): 325 mg via ORAL
  Filled 2014-05-16 (×7): qty 1

## 2014-05-16 MED ORDER — LEVOTHYROXINE SODIUM 50 MCG PO TABS
50.0000 ug | ORAL_TABLET | Freq: Every day | ORAL | Status: DC
  Administered 2014-05-17 – 2014-05-20 (×4): 50 ug via ORAL
  Filled 2014-05-16 (×6): qty 1

## 2014-05-16 MED ORDER — GUAIFENESIN-DM 100-10 MG/5ML PO SYRP
5.0000 mL | ORAL_SOLUTION | ORAL | Status: DC | PRN
  Filled 2014-05-16: qty 5

## 2014-05-16 MED ORDER — MORPHINE SULFATE 4 MG/ML IJ SOLN
4.0000 mg | Freq: Once | INTRAMUSCULAR | Status: AC
Start: 2014-05-16 — End: 2014-05-16
  Administered 2014-05-16: 4 mg via INTRAVENOUS
  Filled 2014-05-16: qty 1

## 2014-05-16 MED ORDER — METRONIDAZOLE 500 MG PO TABS: 500.0000 mg | ORAL_TABLET | Freq: Three times a day (TID) | ORAL | Status: DC

## 2014-05-16 MED ORDER — PROMETHAZINE HCL 25 MG/ML IJ SOLN
25.0000 mg | Freq: Once | INTRAMUSCULAR | Status: AC
  Administered 2014-05-16: 25 mg via INTRAVENOUS
  Filled 2014-05-16: qty 1

## 2014-05-16 MED ORDER — ONDANSETRON HCL 4 MG/2ML IJ SOLN
4.0000 mg | Freq: Once | INTRAMUSCULAR | Status: AC
  Administered 2014-05-16: 4 mg via INTRAVENOUS
  Filled 2014-05-16: qty 2

## 2014-05-16 MED ORDER — PANTOPRAZOLE SODIUM 40 MG PO TBEC
40.0000 mg | DELAYED_RELEASE_TABLET | Freq: Two times a day (BID) | ORAL | Status: DC
  Administered 2014-05-16: 40 mg via ORAL
  Filled 2014-05-16: qty 1

## 2014-05-16 MED ORDER — ONDANSETRON HCL 4 MG/2ML IJ SOLN
4.0000 mg | Freq: Four times a day (QID) | INTRAMUSCULAR | Status: DC | PRN
  Administered 2014-05-16 – 2014-05-20 (×5): 4 mg via INTRAVENOUS
  Filled 2014-05-16 (×6): qty 2

## 2014-05-16 MED ORDER — ENSURE COMPLETE PO LIQD
237.0000 mL | Freq: Two times a day (BID) | ORAL | Status: DC
  Administered 2014-05-19 – 2014-05-20 (×3): 237 mL via ORAL

## 2014-05-16 MED ORDER — METRONIDAZOLE IN NACL 5-0.79 MG/ML-% IV SOLN
500.0000 mg | Freq: Once | INTRAVENOUS | Status: AC
  Administered 2014-05-16: 500 mg via INTRAVENOUS
  Filled 2014-05-16: qty 100

## 2014-05-16 MED ORDER — OXYCODONE-ACETAMINOPHEN 5-325 MG PO TABS
1.0000 | ORAL_TABLET | Freq: Four times a day (QID) | ORAL | Status: DC | PRN
  Administered 2014-05-17 – 2014-05-20 (×10): 1 via ORAL
  Filled 2014-05-16 (×11): qty 1

## 2014-05-16 MED ORDER — ONDANSETRON HCL 4 MG PO TABS: 4.0000 mg | ORAL_TABLET | Freq: Four times a day (QID) | ORAL | Status: DC | PRN

## 2014-05-16 MED ORDER — SUCRALFATE 1 GM/10ML PO SUSP
1.0000 g | Freq: Three times a day (TID) | ORAL | Status: DC
  Administered 2014-05-16 – 2014-05-20 (×14): 1 g via ORAL
  Filled 2014-05-16 (×19): qty 10

## 2014-05-16 NOTE — ED Notes (Signed)
Attempted to give report 

## 2014-05-16 NOTE — ED Notes (Signed)
Patient states she is unable to provide a urine sample at this time.

## 2014-05-16 NOTE — ED Notes (Signed)
Lab results reported to Dr.Beaton. 

## 2014-05-16 NOTE — ED Notes (Signed)
Occult blood stool result is POS.

## 2014-05-16 NOTE — ED Notes (Signed)
MD aware of pain; medication requested; no new orders at this time

## 2014-05-16 NOTE — ED Provider Notes (Signed)
CSN: [id number]     Arrival date & time 05/16/14  1305 History   First MD Initiated Contact with Patient 05/16/14 1310     Chief Complaint  Patient presents with  . Weakness     (Consider location/radiation/quality/duration/timing/severity/associated sxs/prior Treatment) HPI   [age] year old female with past medical history of chronic back pain, recent admission for perforated gastric ulcer, s/p x-lap w/ repair of the ulcer who was d/c on 3/15.  Since d/c she has felt persistently weak.  She has also been having N/V/D.  The nausea/vomiting has been persistent and she has been having trouble keeping anything down.  She is also having multiple daily episodes of NB diarrhea.   These symptoms have been associated with abdominal pain which she has been having since her x-lap and which hasn't changed.    No fever/chills, no dysuria.  No cough.  Past Medical History  Diagnosis Date  . Renal disorder   . Interstitial cystitis   . Thyroid disease   . Hypothyroidism   . Renal stones   . Back pain   . GI bleed    Past Surgical History  Procedure Laterality Date  . Esophagogastroduodenoscopy N/A 04/27/2014    Procedure: ESOPHAGOGASTRODUODENOSCOPY (EGD);  Surgeon: Shirley FriarVincent C. Schooler, MD;  [facility];  Service: Endoscopy;  Laterality: N/A;  . Esophagogastroduodenoscopy (egd) with propofol N/A 05/03/2014    Procedure: ESOPHAGOGASTRODUODENOSCOPY (EGD) WITH PROPOFOL;  Surgeon: Shirley FriarVincent C. Schooler, MD;  Location: [facility] ENDOSCOPY;  Service: Endoscopy;  Laterality: N/A;  . Laparotomy N/A 05/07/2014    Procedure: EXPLORATORY LAPAROTOMY;  Surgeon: Violeta GelinasBurke Thompson, MD;  [facility];  Service: General;  Laterality: N/A;  . Repair of perforated ulcer N/A 05/07/2014    Procedure: Velna HatchetVERSOWE DUODENAL ULCER;  Surgeon: Violeta GelinasBurke Thompson, MD;  [facility];  Service: General;  Laterality: N/A;   No family history on file. History  Substance Use Topics  . Smoking status: Current Every Day Smoker  .  Smokeless tobacco: Not on file  . Alcohol Use: Yes     Comment: rare occas   OB History    No data available     Review of Systems  Constitutional: Negative for fever and chills.  HENT: Negative for nosebleeds.   Eyes: Negative for visual disturbance.  Respiratory: Negative for cough and shortness of breath.   Cardiovascular: Negative for chest pain.  Gastrointestinal: Positive for nausea, vomiting, abdominal pain and diarrhea. Negative for constipation.  Genitourinary: Negative for dysuria.  Skin: Negative for rash.  Neurological: Positive for weakness.  All other systems reviewed and are negative.     Allergies  Gabapentin and Zofran  Home Medications   Prior to Admission medications   Medication Sig Start Date End Date Taking? Authorizing Provider  albuterol (PROVENTIL HFA;VENTOLIN HFA) 108 (90 BASE) MCG/ACT inhaler Inhale 2 puffs into the lungs every 6 (six) hours as needed for wheezing or shortness of breath.   Yes Historical Provider, MD  amitriptyline (ELAVIL) 50 MG tablet TAKE 1 TABLET (50 MG TOTAL) BY MOUTH AT BEDTIME. 03/04/14  Yes Babs SciaraScott A Luking, MD  docusate sodium (COLACE) 100 MG capsule Take 2 capsules (200 mg total) by mouth 2 (two) times daily as needed for mild constipation. 05/14/14  Yes Leroy SeaPrashant K Singh, MD  ferrous sulfate 325 (65 FE) MG tablet Take 1 tablet (325 mg total) by mouth 2 (two) times daily with a meal. 05/14/14  Yes Leroy SeaPrashant K Singh, MD  hydrochlorothiazide (HYDRODIURIL) 25 MG tablet Take 25 mg by mouth  daily.   Yes Historical Provider, MD  levothyroxine (SYNTHROID, LEVOTHROID) 50 MCG tablet TAKE 1 TABLET EVERY DAY 04/17/14  Yes Babs Sciara, MD  nicotine (NICODERM CQ - DOSED IN MG/24 HOURS) 14 mg/24hr patch Place 1 patch (14 mg total) onto the skin daily. 05/14/14  Yes Leroy Sea, MD  oxyCODONE-acetaminophen (PERCOCET) 10-325 MG per tablet Take 1 tablet by mouth every 6 (six) hours as needed for pain. 05/14/14  Yes Leroy Sea, MD   pantoprazole (PROTONIX) 40 MG tablet Take 1 tablet (40 mg total) by mouth 2 (two) times daily. 05/14/14  Yes Leroy Sea, MD  sucralfate (CARAFATE) 1 GM/10ML suspension Take 10 mLs (1 g total) by mouth 4 (four) times daily -  with meals and at bedtime. 05/14/14  Yes Leroy Sea, MD  venlafaxine XR (EFFEXOR-XR) 150 MG 24 hr capsule Take 150 mg by mouth daily with breakfast.   Yes Historical Provider, MD  clonazePAM (KLONOPIN) 1 MG tablet 1/2-1 po BID prn panic attacks Patient not taking: Reported on 05/16/2014 04/01/14   Campbell Riches, NP   BP 93/56 mmHg  Pulse 84  Temp(Src) 99.7 F (37.6 C) (Rectal)  Resp 17  SpO2 100%  LMP 05/09/2014 Physical Exam  Constitutional: She is oriented to person, place, and time. No distress.  HENT:  Head: Normocephalic and atraumatic.  Eyes: EOM are normal. Pupils are equal, round, and reactive to light.  Neck: Normal range of motion. Neck supple.  Cardiovascular: Normal rate and intact distal pulses.   Pulmonary/Chest: No respiratory distress.  Abdominal: Soft. There is tenderness (right sided). There is no rebound and no guarding.  Musculoskeletal: Normal range of motion.  Neurological: She is alert and oriented to person, place, and time.  Skin: No rash noted. She is not diaphoretic.  Psychiatric: She has a normal mood and affect.    ED Course  Procedures (including critical care time) Labs Review Labs Reviewed  CBC WITH DIFFERENTIAL/PLATELET - Abnormal; Notable for the following:    WBC 10.8 (*)    RBC 3.83 (*)    Hemoglobin 11.2 (*)    HCT 33.9 (*)    Platelets 643 (*)    Neutro Abs 8.3 (*)    All other components within normal limits  BASIC METABOLIC PANEL - Abnormal; Notable for the following:    Glucose, Bld 109 (*)    GFR calc non Af Amer 86 (*)    All other components within normal limits  I-STAT CG4 LACTIC ACID, ED - Abnormal; Notable for the following:    Lactic Acid, Venous 2.64 (*)    All other components within  normal limits  POC OCCULT BLOOD, ED - Abnormal; Notable for the following:    Fecal Occult Bld POSITIVE (*)    All other components within normal limits  CLOSTRIDIUM DIFFICILE BY PCR  URINALYSIS, ROUTINE W REFLEX MICROSCOPIC  URINE RAPID DRUG SCREEN (HOSP PERFORMED)  GI PATHOGEN PANEL BY PCR, STOOL  TSH  POC URINE PREG, ED  I-STAT CG4 LACTIC ACID, ED  TYPE AND SCREEN    Imaging Review No results found.   EKG Interpretation None      MDM   Final diagnoses:  Diarrhea   34 year old female with past medical history of chronic back pain, recent admission for perforated gastric ulcer, s/p x-lap w/ repair of the ulcer who was d/c on 3/15.  Since d/c she has felt persistently weak.  She has also been having N/V/D.  Exam as above,  afebrile, tachycardic, good bp.  Right sided abdominal ttp but abdomen soft.  Will obtain labs, ua.  Labs sig for hemoconcentration, elevated lactic acid.  I have given 2L NS, zofran, morphine for pain. Have sent C. Diff.  Patient will be admitted to hosp service for dehydration, possible infectious diarrhea.    Silas Flood, MD 05/16/14 1722  Nelva Nay, MD 05/25/14 [phone]

## 2014-05-16 NOTE — ED Notes (Signed)
Pt requesting medications for nausea and pain; Dr.Singh paged for orders.

## 2014-05-16 NOTE — ED Notes (Signed)
Pt c/o pain and NV on assessment. Clear emesis noted in trashcan; reports drinking water earlier. Pt also assisted to bedside commode; yellow liquid stool BM noted.

## 2014-05-16 NOTE — ED Notes (Signed)
Pt arrives via [facility] c/o weakness and N/V/D x several days. [facility] to [facility].  Pt intially hypotensive 88/43 and tachycardic 100-127 with EMS.  BP-111/65 O2-100% RA.  4mg  Zofran given in route.  Pt alert x 4, NAD.

## 2014-05-16 NOTE — Progress Notes (Addendum)
NURSING PROGRESS NOTE  Kimberly CookeyKatherine N Weiss [id number] Admission Data: 05/16/2014 7:29 PM Attending Provider: Leroy SeaPrashant K Singh, MD WUJ:WJXBJY,NWGNFPCP:LUKING,SCOTT, MD Code Status: Full  Allergies:  Gabapentin and Zofran Past Medical History:   has a past medical history of Renal disorder; Interstitial cystitis; Thyroid disease; Hypothyroidism; Renal stones; Back pain; and GI bleed. Past Surgical History:   has past surgical history that includes Esophagogastroduodenoscopy (N/A, 04/27/2014); Esophagogastroduodenoscopy (egd) with propofol (N/A, 05/03/2014); laparotomy (N/A, 05/07/2014); and Repair of perforated ulcer (N/A, 05/07/2014). Social History:   reports that she has been smoking.  She does not have any smokeless tobacco history on file. She reports that she drinks alcohol. She reports that she does not use illicit drugs.  Kimberly CookeyKatherine N Weiss is a 34 y.o. female patient admitted from ED:   Last Documented Vital Signs: Blood pressure 93/52, pulse 102, temperature 99.1 F (37.3 C), temperature source Oral, resp. rate 14, last menstrual period 05/09/2014, SpO2 100 %.  IV Fluids:  IV in place, occlusive dsg intact without redness, IV cath forearm left, condition patent and no redness normal saline.   Skin: WDL  Patient/Family orientated to room. Information packet given to patient/family. Admission inpatient armband information verified with patient/family to include name and date of birth and placed on patient arm. Side rails up x 2, fall assessment and education completed with patient/family. Patient/family able to verbalize understanding of risk associated with falls and verbalized understanding to call for assistance before getting out of bed. Call light within reach. Patient/family able to voice and demonstrate understanding of unit orientation instructions.    Will continue to evaluate and treat per MD orders.  Patient admitted on floor at 1900 during shift change oncoming RN given report, all questions answered  about patient during report. Oncoming RN verbalized she would release all orders.  Leane PlattSpencer Gwendelyn Lanting RN, BS, BSN

## 2014-05-16 NOTE — H&P (Addendum)
Patient Demographics  Kimberly Weiss, is a 34 y.o. female  MRN: [id number]   DOB - 1980/08/24  Admit Date - 05/16/2014  Outpatient Primary MD for the patient is Lilyan Punt, MD   With History of -  Past Medical History  Diagnosis Date  . Renal disorder   . Interstitial cystitis   . Thyroid disease   . Hypothyroidism   . Renal stones   . Back pain   . GI bleed       Past Surgical History  Procedure Laterality Date  . Esophagogastroduodenoscopy N/A 04/27/2014    Procedure: ESOPHAGOGASTRODUODENOSCOPY (EGD);  Surgeon: Shirley Friar, MD;  Location[facility] ENDOSCOPY;  Service: Endoscopy;  Laterality: N/A;  . Esophagogastroduodenoscopy (egd) with propofol N/A 05/03/2014    Procedure: ESOPHAGOGASTRODUODENOSCOPY (EGD) WITH PROPOFOL;  Surgeon: Shirley Friar, MD;  Location: [facility] ENDOSCOPY;  Service: Endoscopy;  Laterality: N/A;  . Laparotomy N/A 05/07/2014    Procedure: EXPLORATORY LAPAROTOMY;  Surgeon: Violeta Gelinas, MD;  Location: [facility] OR;  Service: General;  Laterality: N/A;  . Repair of perforated ulcer N/A 05/07/2014    Procedure: Velna Hatchet DUODENAL ULCER;  Surgeon: Violeta Gelinas, MD;  Location: [facility] OR;  Service: General;  Laterality: N/A;    in for   Chief Complaint  Patient presents with  . Weakness     HPI  Kimberly Weiss  is a 34 y.o. female, with history of chronic back pain and narcotic dependence, hypothyroidism, recent admission and discharge for Severe upper GI bleed with blood loss treated anemia secondary to duodenal ulcer. Likely due to NSAID use. Status post exploratory laparotomy with oversew of duodenal ulcer by general surgery on 05/07/2014, was discharged home on Protonix and Carafate comes to the ER with chief complaints of diarrhea, nausea vomiting and generalized weakness starting  yesterday.   In the ER workup consistent with severe dehydration with elevated lactic acid level, hypotension patient was experiencing ongoing diarrhea in the ER along with mild nausea vomiting and I was called to admit the patient.    Review of Systems    In addition to the HPI above,   No Fever-chills, No Headache, No changes with Vision or hearing, No problems swallowing food or Liquids, No Chest pain, Cough or Shortness of Breath Positive but improving postop abdominal pain, has ongoing diarrhea along with mild nausea vomiting.  No Blood in stool or Urine, No dysuria, No new skin rashes or bruises, No new joints pains-aches,  No new weakness, tingling, numbness in any extremity, but does have generalized weakness No recent weight gain or loss, No polyuria, polydypsia or polyphagia, No significant Mental Stressors.  A full 10 point Review of Systems was done, except as stated above, all other Review of Systems were negative.   Social History History  Substance Use Topics  . Smoking status: Current Every Day Smoker  . Smokeless tobacco: Not on file  . Alcohol Use: Yes  Comment: rare occas      Family History No family history of peptic ulcer disease  Prior to Admission medications   Medication Sig Start Date End Date Taking? Authorizing Provider  albuterol (PROVENTIL HFA;VENTOLIN HFA) 108 (90 BASE) MCG/ACT inhaler Inhale 2 puffs into the lungs every 6 (six) hours as needed for wheezing or shortness of breath.   Yes Historical Provider, MD  amitriptyline (ELAVIL) 50 MG tablet TAKE 1 TABLET (50 MG TOTAL) BY MOUTH AT BEDTIME. 03/04/14  Yes Babs Sciara, MD  docusate sodium (COLACE) 100 MG capsule Take 2 capsules (200 mg total) by mouth 2 (two) times daily as needed for mild constipation. 05/14/14  Yes Leroy Sea, MD  ferrous sulfate 325 (65 FE) MG tablet Take 1 tablet (325 mg total) by mouth 2 (two) times daily with a meal. 05/14/14  Yes Leroy Sea, MD    hydrochlorothiazide (HYDRODIURIL) 25 MG tablet Take 25 mg by mouth daily.   Yes Historical Provider, MD  levothyroxine (SYNTHROID, LEVOTHROID) 50 MCG tablet TAKE 1 TABLET EVERY DAY 04/17/14  Yes Babs Sciara, MD  nicotine (NICODERM CQ - DOSED IN MG/24 HOURS) 14 mg/24hr patch Place 1 patch (14 mg total) onto the skin daily. 05/14/14  Yes Leroy Sea, MD  oxyCODONE-acetaminophen (PERCOCET) 10-325 MG per tablet Take 1 tablet by mouth every 6 (six) hours as needed for pain. 05/14/14  Yes Leroy Sea, MD  pantoprazole (PROTONIX) 40 MG tablet Take 1 tablet (40 mg total) by mouth 2 (two) times daily. 05/14/14  Yes Leroy Sea, MD  sucralfate (CARAFATE) 1 GM/10ML suspension Take 10 mLs (1 g total) by mouth 4 (four) times daily -  with meals and at bedtime. 05/14/14  Yes Leroy Sea, MD  venlafaxine XR (EFFEXOR-XR) 150 MG 24 hr capsule Take 150 mg by mouth daily with breakfast.   Yes Historical Provider, MD  clonazePAM (KLONOPIN) 1 MG tablet 1/2-1 po BID prn panic attacks Patient not taking: Reported on 05/16/2014 04/01/14   Campbell Riches, NP    Allergies  Allergen Reactions  . Gabapentin Other (See Comments)    "Walking in a fog"  . Zofran [Ondansetron Hcl] Other (See Comments)    Loopy / Spaced out feeling    Physical Exam  Vitals  Blood pressure 106/68, pulse 101, temperature 99.7 F (37.6 C), temperature source Rectal, resp. rate 12, last menstrual period 05/09/2014, SpO2 100 %.   1. General Young Caucasian female lying in bed in NAD,     2. Normal affect and insight, Not Suicidal or Homicidal, Awake Alert, Oriented X 3.  3. No F.N deficits, ALL C.Nerves Intact, Strength 5/5 all 4 extremities, Sensation intact all 4 extremities, Plantars down going.  4. Ears and Eyes appear Normal, Conjunctivae clear, PERRLA. Moist Oral Mucosa.  5. Supple Neck, No JVD, No cervical lymphadenopathy appriciated, No Carotid Bruits.  6. Symmetrical Chest wall movement, Good air movement  bilaterally, CTAB.  7. RRR, No Gallops, Rubs or Murmurs, No Parasternal Heave.  8. Positive Bowel Sounds, Abdomen Soft, midline abdominal incision scar healing well, mild postop generalized tenderness, No organomegaly appriciated,No rebound -guarding or rigidity.  9.  No Cyanosis, Normal Skin Turgor, No Skin Rash or Bruise.  10. Good muscle tone,  joints appear normal , no effusions, Normal ROM.  11. No Palpable Lymph Nodes in Neck or Axillae     Data Review  CBC  Recent Labs Lab 05/10/14 0430  05/11/14 0500 05/12/14 0517 05/13/14 0545  05/14/14 0520 05/16/14 1330  WBC 11.2*  --  8.2 7.8 5.6  --  10.8*  HGB 8.1*  < > 7.8* 8.0* 7.5* 8.1* 11.2*  HCT 24.4*  < > 23.9* 24.2* 23.3* 25.2* 33.9*  PLT 215  --  279 348 368  --  643*  MCV 88.4  --  89.2 89.6 90.0  --  88.5  MCH 29.3  --  29.1 29.6 29.0  --  29.2  MCHC 33.2  --  32.6 33.1 32.2  --  33.0  RDW 16.0*  --  15.5 14.9 14.9  --  14.3  LYMPHSABS  --   --   --   --   --   --  1.5  MONOABS  --   --   --   --   --   --  1.0  EOSABS  --   --   --   --   --   --  0.0  BASOSABS  --   --   --   --   --   --  0.0  < > = values in this interval not displayed. ------------------------------------------------------------------------------------------------------------------  Chemistries   Recent Labs Lab 05/10/14 0430 05/11/14 0500 05/12/14 0517 05/13/14 0545 05/16/14 1330  NA 138 138 140 139 138  K 3.4* 3.5 3.9 3.8 4.2  CL 103 106 108 106 104  CO2 28 23 28 27 22   GLUCOSE 109* 109* 106* 102* 109*  BUN <5* <5* <5* <5* 10  CREATININE 0.63 0.54 0.56 0.56 0.87  CALCIUM 7.9* 8.1* 8.3* 8.1* 9.2  MG 1.9  --   --  1.9  --    ------------------------------------------------------------------------------------------------------------------ estimated creatinine clearance is 85.2 mL/min (by C-G formula based on Cr of  0.87). ------------------------------------------------------------------------------------------------------------------ No results for input(s): TSH, T4TOTAL, T3FREE, THYROIDAB in the last 72 hours.  Invalid input(s): FREET3   Coagulation profile No results for input(s): INR, PROTIME in the last 168 hours. ------------------------------------------------------------------------------------------------------------------- No results for input(s): DDIMER in the last 72 hours. -------------------------------------------------------------------------------------------------------------------  Cardiac Enzymes No results for input(s): CKMB, TROPONINI, MYOGLOBIN in the last 168 hours.  Invalid input(s): CK ------------------------------------------------------------------------------------------------------------------ Invalid input(s): POCBNP   ---------------------------------------------------------------------------------------------------------------  Urinalysis    Component Value Date/Time   COLORURINE YELLOW 05/12/2014 2240   APPEARANCEUR CLEAR 05/12/2014 2240   LABSPEC 1.006 05/12/2014 2240   PHURINE 7.0 05/12/2014 2240   GLUCOSEU NEGATIVE 05/12/2014 2240   HGBUR TRACE* 05/12/2014 2240   BILIRUBINUR NEGATIVE 05/12/2014 2240   KETONESUR NEGATIVE 05/12/2014 2240   PROTEINUR NEGATIVE 05/12/2014 2240   UROBILINOGEN 1.0 05/12/2014 2240   NITRITE NEGATIVE 05/12/2014 2240   LEUKOCYTESUR TRACE* 05/12/2014 2240    ----------------------------------------------------------------------------------------------------------------  Imaging results:   No results found.      Assessment & Plan   1. Diarrhea, nausea vomiting  causing dehydration and hypotension. Will be kept for 23 observation, IV fluid bolus and maintenance, check C. difficile and GI pathogen panel, if C. difficile negative Will place on Imodium. Soft diet, supportive care with Zofran for nausea and monitor. For  now place on Flagyl   2. Recent GI bleed secondary to duodenal ulcer with hemorrhage. Status post surgical suturing as above, stable H&H, continue Protonix and Carafate.   3. Chronic back pain. Chronic narcotic dependence. Home narcotics continued, counseled not to overuse narcotics.   4. Hypothyroidism. On Synthroid. Will check TSH.   5. Smoking. Counseled to quit, continue nicotine patch.    Patient requests that she has poor living situation and needs  help. We'll request case manager to evaluate.    DVT Prophylaxis  SCDs    AM Labs Ordered, also please review Full Orders  Family Communication: Admission, patients condition and plan of care including tests being ordered have been discussed with the patient  who indicates understanding and agree with the plan and Code Status.  Code Status full  Likely DC to home  Condition fair  Time spent in minutes : 35    Susa RaringSINGH,PRASHANT K M.D on 05/16/2014 at 3:57 PM  Between 7am to 7pm - Pager - [phone]  After 7pm go to [email or web address] - password [email or web address]  [facility]  Office  [phone]

## 2014-05-17 ENCOUNTER — Encounter (HOSPITAL_COMMUNITY): Payer: Self-pay | Admitting: Radiology

## 2014-05-17 ENCOUNTER — Observation Stay (HOSPITAL_COMMUNITY): Payer: Medicaid Other

## 2014-05-17 DIAGNOSIS — Z87442 Personal history of urinary calculi: Secondary | ICD-10-CM | POA: Diagnosis not present

## 2014-05-17 DIAGNOSIS — G8929 Other chronic pain: Secondary | ICD-10-CM | POA: Diagnosis present

## 2014-05-17 DIAGNOSIS — N289 Disorder of kidney and ureter, unspecified: Secondary | ICD-10-CM | POA: Diagnosis present

## 2014-05-17 DIAGNOSIS — Z888 Allergy status to other drugs, medicaments and biological substances status: Secondary | ICD-10-CM | POA: Diagnosis not present

## 2014-05-17 DIAGNOSIS — R571 Hypovolemic shock: Secondary | ICD-10-CM | POA: Diagnosis present

## 2014-05-17 DIAGNOSIS — E039 Hypothyroidism, unspecified: Secondary | ICD-10-CM | POA: Diagnosis present

## 2014-05-17 DIAGNOSIS — I959 Hypotension, unspecified: Secondary | ICD-10-CM | POA: Diagnosis present

## 2014-05-17 DIAGNOSIS — K529 Noninfective gastroenteritis and colitis, unspecified: Secondary | ICD-10-CM | POA: Diagnosis present

## 2014-05-17 DIAGNOSIS — F1120 Opioid dependence, uncomplicated: Secondary | ICD-10-CM | POA: Diagnosis present

## 2014-05-17 DIAGNOSIS — Z8711 Personal history of peptic ulcer disease: Secondary | ICD-10-CM | POA: Diagnosis not present

## 2014-05-17 DIAGNOSIS — R531 Weakness: Secondary | ICD-10-CM | POA: Diagnosis present

## 2014-05-17 DIAGNOSIS — F17210 Nicotine dependence, cigarettes, uncomplicated: Secondary | ICD-10-CM | POA: Diagnosis present

## 2014-05-17 DIAGNOSIS — M549 Dorsalgia, unspecified: Secondary | ICD-10-CM | POA: Diagnosis present

## 2014-05-17 LAB — BASIC METABOLIC PANEL
Anion gap: 4 — ABNORMAL LOW (ref 5–15)
BUN: 8 mg/dL (ref 6–23)
CO2: 21 mmol/L (ref 19–32)
Calcium: 7.8 mg/dL — ABNORMAL LOW (ref 8.4–10.5)
Chloride: 114 mmol/L — ABNORMAL HIGH (ref 96–112)
Creatinine, Ser: 0.65 mg/dL (ref 0.50–1.10)
GFR calc Af Amer: >90 mL/min (ref >90)
GFR calc non Af Amer: >90 mL/min (ref >90)
Glucose, Bld: 123 mg/dL — ABNORMAL HIGH (ref 70–99)
Potassium: 3.6 mmol/L (ref 3.5–5.1)
Sodium: 139 mmol/L (ref 135–145)

## 2014-05-17 LAB — CBC
HCT: 28.3 % — ABNORMAL LOW (ref 36.0–46.0)
Hemoglobin: 8.9 g/dL — ABNORMAL LOW (ref 12.0–15.0)
MCH: 28.6 pg (ref 26.0–34.0)
MCHC: 31.4 g/dL (ref 30.0–36.0)
MCV: 91 fL (ref 78.0–100.0)
Platelets: 486 10*3/uL — ABNORMAL HIGH (ref 150–400)
RBC: 3.11 MIL/uL — ABNORMAL LOW (ref 3.87–5.11)
RDW: 14.6 % (ref 11.5–15.5)
WBC: 7.8 10*3/uL (ref 4.0–10.5)

## 2014-05-17 MED ORDER — SODIUM CHLORIDE 0.9 % IV BOLUS (SEPSIS)
500.0000 mL | Freq: Once | INTRAVENOUS | Status: AC
  Administered 2014-05-17: 500 mL via INTRAVENOUS

## 2014-05-17 MED ORDER — PANTOPRAZOLE SODIUM 40 MG IV SOLR
40.0000 mg | Freq: Two times a day (BID) | INTRAVENOUS | Status: DC
  Administered 2014-05-17 – 2014-05-18 (×3): 40 mg via INTRAVENOUS
  Filled 2014-05-17 (×4): qty 40

## 2014-05-17 MED ORDER — CEFTRIAXONE SODIUM IN DEXTROSE 20 MG/ML IV SOLN
1.0000 g | INTRAVENOUS | Status: DC
  Administered 2014-05-17: 1 g via INTRAVENOUS
  Filled 2014-05-17: qty 50

## 2014-05-17 MED ORDER — MORPHINE SULFATE 2 MG/ML IJ SOLN
2.0000 mg | INTRAMUSCULAR | Status: DC | PRN
  Administered 2014-05-17 – 2014-05-18 (×6): 2 mg via INTRAVENOUS
  Filled 2014-05-17 (×7): qty 1

## 2014-05-17 MED ORDER — IOHEXOL 300 MG/ML  SOLN
100.0000 mL | Freq: Once | INTRAMUSCULAR | Status: AC | PRN
  Administered 2014-05-17: 100 mL via INTRAVENOUS

## 2014-05-17 NOTE — Evaluation (Signed)
Physical Therapy Evaluation Patient Details Name: Kimberly CookeyKatherine N Wichman MRN: [id number] DOB: 09/16/1980 Today's Date: 05/17/2014   History of Present Illness  34 y/o admitted with diarrhea, N/V and weakness.  Pt had just been d/c 3/15 after lengthy hospital stay wtih severe upper GI bleed with duodenal ulcer.  Clinical Impression  PT evaluation limited by 10/10 abd pain and pt very emotional about not being there for her son, as well as waiting for CT scan.  Will attempt gait next session to fully assess mobility.  Do not anticipate that pt will need any follow up PT.    Follow Up Recommendations No PT follow up    Equipment Recommendations  None recommended by PT    Recommendations for Other Services       Precautions / Restrictions Precautions Precautions: Fall Restrictions Weight Bearing Restrictions: No      Mobility  Bed Mobility       Sidelying to sit: HOB elevated;Modified independent (Device/Increase time) Supine to sit: Modified independent (Device/Increase time) Sit to supine: Modified independent (Device/Increase time)      Transfers     Transfers: Sit to/from [employer]GI CorporationStand;Stand Pivot Transfers Sit to Stand: Supervision Stand pivot transfers: Supervision       General transfer comment: S for IV and low BP, but no LOB  Ambulation/Gait             General Gait Details: Pt deferred gait due to 10/10 pain and waiting for CT scan  Stairs            Wheelchair Mobility    Modified Rankin (Stroke Patients Only)       Balance Overall balance assessment: No apparent balance deficits (not formally assessed)                                           Pertinent Vitals/Pain Pain Assessment: 0-10 Pain Score: 10-Worst pain ever Pain Location: abd Pain Intervention(s): Limited activity within patient's tolerance;Monitored during session;Relaxation;Repositioned    Home Living Family/patient expects to be discharged to:: Private  residence Living Arrangements: Parent Available Help at Discharge: Family;Available PRN/intermittently (lives with dad who is disabled) Type of Home: Mobile home Home Access: Stairs to enter Entrance Stairs-Rails: None Entrance Stairs-Number of Steps: 5 Home Layout: One level Home Equipment: None      Prior Function Level of Independence: Independent               Hand Dominance        Extremity/Trunk Assessment   Upper Extremity Assessment: Overall WFL for tasks assessed           Lower Extremity Assessment: Overall WFL for tasks assessed;Generalized weakness (difficult to assess due to pain)         Communication   Communication: No difficulties  Cognition Arousal/Alertness: Awake/alert Behavior During Therapy: Anxious;Restless Overall Cognitive Status: Within Functional Limits for tasks assessed                      General Comments General comments (skin integrity, edema, etc.): Pt tearful and crying throughout limited evaluation.  Pt very concerned that her son is going to be taken from her.      Exercises        Assessment/Plan    PT Assessment Patient needs continued PT services  PT Diagnosis Acute pain   PT Problem List Pain;Decreased mobility  PT Treatment Interventions Gait training;Functional mobility training;Therapeutic activities;Therapeutic exercise   PT Goals (Current goals can be found in the Care Plan section) Acute Rehab PT Goals Patient Stated Goal: Feel better and not lose her son PT Goal Formulation: With patient Time For Goal Achievement: 05/24/14 Potential to Achieve Goals: Good    Frequency Min 3X/week   Barriers to discharge Decreased caregiver support Pt does not feel her father can help her as much as she needs    Co-evaluation               End of Session   Activity Tolerance: Patient limited by pain Patient left: in bed;with bed alarm set;with call bell/phone within reach Nurse Communication:  Mobility status    Functional Assessment Tool Used: objective findings Functional Limitation: Mobility: Walking and moving around Mobility: Walking and Moving Around Current Status (Z6109): At least 1 percent but less than 20 percent impaired, limited or restricted Mobility: Walking and Moving Around Goal Status [phone]): 0 percent impaired, limited or restricted    Time: 1000-1015 PT Time Calculation (min) (ACUTE ONLY): 15 min   Charges:   PT Evaluation $Initial PT Evaluation Tier I: 1 Procedure     PT G Codes:   PT G-Codes **NOT FOR INPATIENT CLASS** Functional Assessment Tool Used: objective findings Functional Limitation: Mobility: Walking and moving around Mobility: Walking and Moving Around Current Status (U9811): At least 1 percent but less than 20 percent impaired, limited or restricted Mobility: Walking and Moving Around Goal Status [phone]): 0 percent impaired, limited or restricted    Rodrick Payson LUBECK 05/17/2014, 10:30 AM

## 2014-05-17 NOTE — Discharge Summary (Deleted)
Subjective: Pt known to our service for recent surgery for oversew of a bleeding duodenal ulcer.  The patient was discharged 3 days ago doing very well.  The patient states she came back to the [facility] last night due to nausea, vomiting, and diarrhea.  She denies any blood in her emesis or stool.  She states she is having pain at her incision.  She denies fevers.  She was readmitted and we have been asked to reconsult on her due to her recent surgery.  Objective: Vital signs in last 24 hours: Temp:  [98.4 F (36.9 C)-99.7 F (37.6 C)] 98.6 F (37 C) (03/18 0545) Pulse Rate:  [59-108] 59 (03/18 0545) Resp:  [12-19] 18 (03/18 0545) BP: (84-106)/(44-74) 92/64 mmHg (03/18 0618) SpO2:  [100 %] 100 % (03/18 0545) Weight:  [66.225 kg (146 lb)] 66.225 kg (146 lb) (03/18 0518) Last BM Date: 05/17/14  Intake/Output from previous day: 03/17 0701 - 03/18 0700 In: 2180 [P.O.:180; I.V.:2000] Out: -  Intake/Output this shift: Total I/O In: 120 [P.O.:120] Out: -   PE: Abd: soft, minimally tender with palpation, incision well healed with no evidence of erythema or infection, +BS, ND Heart: regular Lungs: CTAB  Lab Results:   Recent Labs  05/16/14 1330 05/17/14 0704  WBC 10.8* 7.8  HGB 11.2* 8.9*  HCT 33.9* 28.3*  PLT 643* 486*   BMET  Recent Labs  05/16/14 1330 05/17/14 0704  NA 138 139  K 4.2 3.6  CL 104 114*  CO2 22 21  GLUCOSE 109* 123*  BUN 10 8  CREATININE 0.87 0.65  CALCIUM 9.2 7.8*   PT/INR No results for input(s): LABPROT, INR in the last 72 hours. CMP     Component Value Date/Time   NA 139 05/17/2014 0704   K 3.6 05/17/2014 0704   CL 114* 05/17/2014 0704   CO2 21 05/17/2014 0704   GLUCOSE 123* 05/17/2014 0704   BUN 8 05/17/2014 0704   CREATININE 0.65 05/17/2014 0704   CALCIUM 7.8* 05/17/2014 0704   PROT 4.6* 05/04/2014 0518   ALBUMIN 2.3* 05/04/2014 0518   AST 13 05/04/2014 0518   ALT 11 05/04/2014 0518   ALKPHOS 59 05/04/2014 0518   BILITOT 0.5  05/04/2014 0518   GFRNONAA >90 05/17/2014 0704   GFRAA >90 05/17/2014 0704   Lipase     Component Value Date/Time   LIPASE 28 04/26/2014 2314       Studies/Results: No results found.  Anti-infectives: Anti-infectives    Start     Dose/Rate Route Frequency Ordered Stop   05/17/14 1030  cefTRIAXone (ROCEPHIN) 1 g in dextrose 5 % 50 mL IVPB - Premix     1 g 100 mL/hr over 30 Minutes Intravenous Every 24 hours 05/17/14 1016     05/17/14 0000  metroNIDAZOLE (FLAGYL) IVPB 500 mg     500 mg 100 mL/hr over 60 Minutes Intravenous Every 8 hours 05/16/14 1906     05/16/14 1830  metroNIDAZOLE (FLAGYL) IVPB 500 mg  Status:  Discontinued     500 mg 100 mL/hr over 60 Minutes Intravenous Every 8 hours 05/16/14 1829 05/16/14 1905   05/16/14 1645  metroNIDAZOLE (FLAGYL) IVPB 500 mg     500 mg 100 mL/hr over 60 Minutes Intravenous  Once 05/16/14 1635 05/16/14 1752   05/16/14 1600  metroNIDAZOLE (FLAGYL) tablet 500 mg  Status:  Discontinued     500 mg Oral 3 times per day 05/16/14 1553 05/16/14 1829       Assessment/Plan  POD 10, s/p ex lap with oversew of bleeding duodenal ulcer -patient's discharge hgb was 7.5.  It was 11 on admission and trended down to 8.6.  This is likely dilutional given her hgb is still higher than her discharge hgb.  She is heme +, but is likely still having some old blood mixed in her bowel movements.   -she is AF and has no elevation of her WBC.  She has great BS and her abdominal exam is benign.  I have a very low suspicion for post op complication.  I doubt she really needs a CT scan, but this has already been ordered.  Will follow for the results.  I otherwise agree with symptomatic control for now and diet advancement as able if CT is negative. - i explained to her that she will likely have pain for up to 6 weeks from this operation and that pain due to her incision is normal.  There is no evidence of infection at her incision.   Kimberly Weiss E 05/17/2014,  10:45 AM Pager: [phone]

## 2014-05-17 NOTE — Progress Notes (Signed)
INITIAL NUTRITION ASSESSMENT  DOCUMENTATION CODES Per approved criteria  -Not Applicable   INTERVENTION: -When diet advanced recommend ordering Boost BID, each providing 360 kcal and 14 g protein  NUTRITION DIAGNOSIS: Inadequate oral intake related to decreased appetite as evidenced by nausea, vomiting, diarrhea x 3 days.   Goal: Pt to meet >/= 90% of estimated needs  Monitor:  Diet advancement, weight trends, labs  Reason for Assessment: MST  34 y.o. female  Admitting Dx: Diarrhea  ASSESSMENT: Pt with history of chronic back pain and narcotic dependence, hypothyroidism, recent admission and discharge for severe upper GI bleed.  S/p exploratory laparotomy with oversew of duodenal ulcer. Complains of diarrhea, nausea/vomiting and generalized weakness since discharge.  Per wt records pt has lost 7 lbs in the last 3 days. Otherwise weight has been stable over the past year. Pt reports only drinking Boost and energy drinks since discharge.  Pt says its the only thing she had at home and did not have resources to get to the store.   No signs of fat or muscle wasting.  Height: Ht Readings from Last 1 Encounters:  05/17/14  (1.6 m)    Weight: Wt Readings from Last 1 Encounters:  05/17/14 146 lb (66.225 kg)    Ideal Body Weight: 115 lbs (52 kg)  % Ideal Body Weight: 127%  Wt Readings from Last 10 Encounters:  05/17/14 146 lb (66.225 kg)  05/14/14 153 lb 4.8 oz (69.536 kg)  02/04/14 151 lb (68.493 kg)  01/04/14 151 lb (68.493 kg)  08/30/13 153 lb (69.4 kg)  07/19/13 155 lb (70.308 kg)  01/22/13 159 lb 4 oz (72.235 kg)  11/16/12 170 lb (77.111 kg)  11/02/12 164 lb (74.39 kg)  06/08/12 160 lb 5 oz (72.717 kg)    Usual Body Weight: n/a  % Usual Body Weight: n/a  BMI:  Body mass index is 25.87 kg/(m^2).  Estimated Nutritional Needs: Kcal: 1700-1900 Protein: 75-90 g protein Fluid: >/= 1.8 L  Skin: Incisions on abdomen and right groin  Diet Order: Diet NPO  time specified  EDUCATION NEEDS: -No education needs identified at this time   Intake/Output Summary (Last 24 hours) at 05/17/14 1436 Last data filed at 05/17/14 0915  Gross per 24 hour  Intake   2300 ml  Output      0 ml  Net   2300 ml    Last BM: none charted for this encounter   Labs:   Recent Labs Lab 05/13/14 0545 05/16/14 1330 05/17/14 0704  NA 139 138 139  K 3.8 4.2 3.6  CL 106 104 114*  CO2 BUN <5* 10 8  CREATININE 0.56 0.87 0.65  CALCIUM 8.1* 9.2 7.8*  MG 1.9  --   --   PHOS 2.9  --   --   GLUCOSE 102* 109* 123*    CBG (last 3)  No results for input(s): GLUCAP in the last 72 hours.  Scheduled Meds: . cefTRIAXone (ROCEPHIN)  IV  1 g Intravenous Q24H  . clonazePAM  0.25 mg Oral BID  . feeding supplement (ENSURE COMPLETE)  237 mL Oral BID BM  . ferrous sulfate  325 mg Oral BID WC  . levothyroxine  50 mcg Oral QAC breakfast  . metronidazole  500 mg Intravenous Q8H  . nicotine  14 mg Transdermal Daily  . pantoprazole (PROTONIX) IV  40 mg Intravenous Q12H  . sucralfate  1 g Oral TID WC & HS  . venlafaxine XR  150 mg Oral Q breakfast    Continuous Infusions: . sodium chloride 125 mL/hr at 05/17/14 [id number]    Past Medical History  Diagnosis Date  . Renal disorder   . Interstitial cystitis   . Thyroid disease   . Hypothyroidism   . Renal stones   . Back pain   . GI bleed     Past Surgical History  Procedure Laterality Date  . Esophagogastroduodenoscopy N/A 04/27/2014    Procedure: ESOPHAGOGASTRODUODENOSCOPY (EGD);  Surgeon: Shirley FriarVincent C. Schooler, MD;  [facility];  Service: Endoscopy;  Laterality: N/A;  . Esophagogastroduodenoscopy (egd) with propofol N/A 05/03/2014    Procedure: ESOPHAGOGASTRODUODENOSCOPY (EGD) WITH PROPOFOL;  Surgeon: Shirley FriarVincent C. Schooler, MD;  Location: [facility] ENDOSCOPY;  Service: Endoscopy;  Laterality: N/A;  . Laparotomy N/A 05/07/2014    Procedure: EXPLORATORY LAPAROTOMY;  Surgeon: Violeta GelinasBurke Thompson, MD;  [facility];  Service: General;  Laterality: N/A;  . Repair of perforated ulcer N/A 05/07/2014    Procedure: Velna HatchetVERSOWE DUODENAL ULCER;  Surgeon: Violeta GelinasBurke Thompson, MD;  [facility];  Service: General;  Laterality: N/A;    Magdalen SpatzLauren Frisco Cordts MS Dietetic Intern Pager Number [phone]

## 2014-05-17 NOTE — Progress Notes (Signed)
Subjective: Pt known to our service for recent surgery for oversew of a bleeding duodenal ulcer.  The patient was discharged 3 days ago doing very well.  The patient states she came [facility] to the [facility] last night due to nausea, vomiting, and diarrhea.  She denies any blood in her emesis or stool.  She states she is having pain at her incision.  She denies fevers.  She was readmitted and we have been asked to reconsult on her due to her recent surgery.  Objective: Vital signs in last 24 hours: Temp:  [98.4 F (36.9 C)-99.7 F (37.6 C)] 98.6 F (37 C) (03/18 0545) Pulse Rate:  [59-108] 59 (03/18 0545) Resp:  [12-19] 18 (03/18 0545) BP: (84-106)/(44-74) 92/64 mmHg (03/18 0618) SpO2:  [100 %] 100 % (03/18 0545) Weight:  [66.225 kg (146 lb)] 66.225 kg (146 lb) (03/18 0518) Last BM Date: 05/17/14  Intake/Output from previous day: 03/17 0701 - 03/18 0700 In: 2180 [P.O.:180; I.V.:2000] Out: -  Intake/Output this shift: Total I/O In: 120 [P.O.:120] Out: -   PE: Abd: soft, minimally tender with palpation, incision well healed with no evidence of erythema or infection, +BS, ND Heart: regular Lungs: CTAB  Lab Results:   Recent Labs  05/16/14 1330 05/17/14 0704  WBC 10.8* 7.8  HGB 11.2* 8.9*  HCT 33.9* 28.3*  PLT 643* 486*   BMET  Recent Labs  05/16/14 1330 05/17/14 0704  NA 138 139  K 4.2 3.6  CL 104 114*  CO2 22 21  GLUCOSE 109* 123*  BUN 10 8  CREATININE 0.87 0.65  CALCIUM 9.2 7.8*   PT/INR No results for input(s): LABPROT, INR in the last 72 hours. CMP     Component Value Date/Time   NA 139 05/17/2014 0704   K 3.6 05/17/2014 0704   CL 114* 05/17/2014 0704   CO2 21 05/17/2014 0704   GLUCOSE 123* 05/17/2014 0704   BUN 8 05/17/2014 0704   CREATININE 0.65 05/17/2014 0704   CALCIUM 7.8* 05/17/2014 0704   PROT 4.6* 05/04/2014 0518   ALBUMIN 2.3* 05/04/2014 0518   AST 13 05/04/2014 0518   ALT 11 05/04/2014 0518   ALKPHOS 59 05/04/2014 0518   BILITOT 0.5  05/04/2014 0518   GFRNONAA >90 05/17/2014 0704   GFRAA >90 05/17/2014 0704   Lipase     Component Value Date/Time   LIPASE 28 04/26/2014 2314       Studies/Results: No results found.  Anti-infectives: Anti-infectives    Start     Dose/Rate Route Frequency Ordered Stop   05/17/14 1030  cefTRIAXone (ROCEPHIN) 1 g in dextrose 5 % 50 mL IVPB - Premix     1 g 100 mL/hr over 30 Minutes Intravenous Every 24 hours 05/17/14 1016     05/17/14 0000  metroNIDAZOLE (FLAGYL) IVPB 500 mg     500 mg 100 mL/hr over 60 Minutes Intravenous Every 8 hours 05/16/14 1906     05/16/14 1830  metroNIDAZOLE (FLAGYL) IVPB 500 mg  Status:  Discontinued     500 mg 100 mL/hr over 60 Minutes Intravenous Every 8 hours 05/16/14 1829 05/16/14 1905   05/16/14 1645  metroNIDAZOLE (FLAGYL) IVPB 500 mg     500 mg 100 mL/hr over 60 Minutes Intravenous  Once 05/16/14 1635 05/16/14 1752   05/16/14 1600  metroNIDAZOLE (FLAGYL) tablet 500 mg  Status:  Discontinued     500 mg Oral 3 times per day 05/16/14 1553 05/16/14 1829       Assessment/Plan    POD 10, s/p ex lap with oversew of bleeding duodenal ulcer -patient's discharge hgb was 7.5.  It was 11 on admission and trended down to 8.6.  This is likely dilutional given her hgb is still higher than her discharge hgb.  She is heme +, but is likely still having some old blood mixed in her bowel movements.   -she is AF and has no elevation of her WBC.  She has great BS and her abdominal exam is benign.  I have a very low suspicion for post op complication.  I doubt she really needs a CT scan, but this has already been ordered.  Will follow for the results.  I otherwise agree with symptomatic control for now and diet advancement as able if CT is negative. - i explained to her that she will likely have pain for up to 6 weeks from this operation and that pain due to her incision is normal.  There is no evidence of infection at her incision.   Kimberly Weiss E 05/17/2014,  10:45 AM Pager: [phone]  

## 2014-05-17 NOTE — Progress Notes (Signed)
Pt's is hypotensive with manual BP 88/56, pt states that her baseline BP is near 90s/60s . On-call MD paged, a bolus was ordered. Will continue to monitor.

## 2014-05-17 NOTE — Consult Note (Signed)
Referring Provider: Dr. Randol Kern Primary Care Physician:  Lilyan Punt, MD Primary Gastroenterologist:  UNASSIGNED  Reason for Consultation:  Abdominal pain; Heme positive stool  HPI: Kimberly Weiss is a 34 y.o. female who was recently in the hospital for a bleeding duodenal ulcer (NSAID-induced) and had surgery for that on 05/07/14 with exploratory laparotomy and oversew of the duodenal ulcer. She was discharged on 05/14/14 and reports having worsened diffuse abdominal pain (worse in epigastric, RUQ and radiation to her back) yesterday along with onset of recurrent bilious vomiting. Reports very dark brown stool but denies black tarry stools or hematochezia. Denies hematemesis. Felt very weak yesterday and came in to the ER for further evaluation. Reports having several days of nonbloody loose stools. C. Diff negative. Hgb 11.2 on admit and 8.9 today but it was 8.1 at discharge on 05/14/14. Heme positive. Denies NSAIDs since discharge. Denies alcohol.    Past Medical History  Diagnosis Date  . Renal disorder   . Interstitial cystitis   . Thyroid disease   . Hypothyroidism   . Renal stones   . Back pain   . GI bleed     Past Surgical History  Procedure Laterality Date  . Esophagogastroduodenoscopy N/A 04/27/2014    Procedure: ESOPHAGOGASTRODUODENOSCOPY (EGD);  Surgeon: Shirley Friar, MD;  Location: [facility] ENDOSCOPY;  Service: Endoscopy;  Laterality: N/A;  . Esophagogastroduodenoscopy (egd) with propofol N/A 05/03/2014    Procedure: ESOPHAGOGASTRODUODENOSCOPY (EGD) WITH PROPOFOL;  Surgeon: Shirley Friar, MD;  Location: [facility] ENDOSCOPY;  Service: Endoscopy;  Laterality: N/A;  . Laparotomy N/A 05/07/2014    Procedure: EXPLORATORY LAPAROTOMY;  Surgeon: Violeta Gelinas, MD;  Location: [facility] OR;  Service: General;  Laterality: N/A;  . Repair of perforated ulcer N/A 05/07/2014    Procedure: Velna Hatchet DUODENAL ULCER;  Surgeon: Violeta Gelinas, MD;  Location: [facility] OR;  Service: General;  Laterality:  N/A;    Prior to Admission medications   Medication Sig Start Date End Date Taking? Authorizing Provider  albuterol (PROVENTIL HFA;VENTOLIN HFA) 108 (90 BASE) MCG/ACT inhaler Inhale 2 puffs into the lungs every 6 (six) hours as needed for wheezing or shortness of breath.   Yes Historical Provider, MD  amitriptyline (ELAVIL) 50 MG tablet TAKE 1 TABLET (50 MG TOTAL) BY MOUTH AT BEDTIME. 03/04/14  Yes Babs Sciara, MD  docusate sodium (COLACE) 100 MG capsule Take 2 capsules (200 mg total) by mouth 2 (two) times daily as needed for mild constipation. 05/14/14  Yes Leroy Sea, MD  ferrous sulfate 325 (65 FE) MG tablet Take 1 tablet (325 mg total) by mouth 2 (two) times daily with a meal. 05/14/14  Yes Leroy Sea, MD  hydrochlorothiazide (HYDRODIURIL) 25 MG tablet Take 25 mg by mouth daily.   Yes Historical Provider, MD  levothyroxine (SYNTHROID, LEVOTHROID) 50 MCG tablet TAKE 1 TABLET EVERY DAY 04/17/14  Yes Babs Sciara, MD  nicotine (NICODERM CQ - DOSED IN MG/24 HOURS) 14 mg/24hr patch Place 1 patch (14 mg total) onto the skin daily. 05/14/14  Yes Leroy Sea, MD  oxyCODONE-acetaminophen (PERCOCET) 10-325 MG per tablet Take 1 tablet by mouth every 6 (six) hours as needed for pain. 05/14/14  Yes Leroy Sea, MD  pantoprazole (PROTONIX) 40 MG tablet Take 1 tablet (40 mg total) by mouth 2 (two) times daily. 05/14/14  Yes Leroy Sea, MD  sucralfate (CARAFATE) 1 GM/10ML suspension Take 10 mLs (1 g total) by mouth 4 (four) times daily -  with meals and at bedtime.  05/14/14  Yes Leroy Sea, MD  venlafaxine XR (EFFEXOR-XR) 150 MG 24 hr capsule Take 150 mg by mouth daily with breakfast.   Yes Historical Provider, MD  clonazePAM (KLONOPIN) 1 MG tablet 1/2-1 po BID prn panic attacks Patient not taking: Reported on 05/16/2014 04/01/14   Campbell Riches, NP    Scheduled Meds: . clonazePAM  0.25 mg Oral BID  . feeding supplement (ENSURE COMPLETE)  237 mL Oral BID BM  . ferrous  sulfate  325 mg Oral BID WC  . levothyroxine  50 mcg Oral QAC breakfast  . metronidazole  500 mg Intravenous Q8H  . nicotine  14 mg Transdermal Daily  . pantoprazole (PROTONIX) IV  40 mg Intravenous Q12H  . sucralfate  1 g Oral TID WC & HS  . venlafaxine XR  150 mg Oral Q breakfast   Continuous Infusions: . sodium chloride 125 mL/hr at 05/17/14 0253   PRN Meds:.guaiFENesin-dextromethorphan, HYDROcodone-acetaminophen, morphine injection, ondansetron **OR** ondansetron (ZOFRAN) IV, oxyCODONE-acetaminophen **AND** oxyCODONE, promethazine  Allergies as of 05/16/2014 - Review Complete 05/16/2014  Allergen Reaction Noted  . Gabapentin Other (See Comments) 07/19/2013  . Zofran [ondansetron hcl] Other (See Comments) 01/22/2013    No family history on file.  History   Social History  . Marital Status: Single    Spouse Name: N/A  . Number of Children: N/A  . Years of Education: N/A   Occupational History  . Not on file.   Social History Main Topics  . Smoking status: Current Every Day Smoker  . Smokeless tobacco: Not on file  . Alcohol Use: Yes     Comment: rare occas  . Drug Use: No  . Sexual Activity: Not on file   Other Topics Concern  . Not on file   Social History Narrative    Review of Systems: All negative except as stated above in HPI.  Physical Exam: Vital signs: Filed Vitals:   05/17/14 0618  BP: 92/64  Pulse: 59  Temp: 98.6  Resp: 18   Last BM Date: 05/17/14 General:   Lethargic, uncomfortable, Well-developed HEENT: anicteric Lungs:  Clear throughout to auscultation.   No wheezes, crackles, or rhonchi. No acute distress. Heart:  Regular rate and rhythm; no murmurs, clicks, rubs,  or gallops. Abdomen: diffuse tenderness with guarding, soft, nondistended, +BS, healing surgical incisions  Rectal:  Deferred Ext: no edema  GI:  Lab Results:  Recent Labs  05/16/14 1330 05/17/14 0704  WBC 10.8* 7.8  HGB 11.2* 8.9*  HCT 33.9* 28.3*  PLT 643* 486*    BMET  Recent Labs  05/16/14 1330 05/17/14 0704  NA 138 139  K 4.2 3.6  CL 104 114*  CO2 22 21  GLUCOSE 109* 123*  BUN 10 8  CREATININE 0.87 0.65  CALCIUM 9.2 7.8*   LFT No results for input(s): PROT, ALBUMIN, AST, ALT, ALKPHOS, BILITOT, BILIDIR, IBILI in the last 72 hours. PT/INR No results for input(s): LABPROT, INR in the last 72 hours.   Studies/Results: No results found.  Impression/Plan: 34 yo with recent bleeding duodenal ulcer that required surgery presents with N/V/D without any overt bleeding. Heme positive stool. Initial Hgb likely falsely elevated due to dehydration and repeat of 8.9 is stable from d/c Hgb. She needs a abd/pelvis CT due to abdominal pain and recent surgery for a large duodenal ulcer. I do not think an urgent EGD is needed at this time. Surgery needs to be consulted and hospitalist aware of my recommendations. Strict NPO. IVFs.  Change to Protonix drip. Dr. Matthias HughsBuccini to see tomorrow.      Kimberly Weiss C.  05/17/2014, 10:00 AM

## 2014-05-17 NOTE — Progress Notes (Signed)
Patient Demographics  Kimberly Weiss, is a 34 y.o. female, DOB - May 09, 1980, [id number]  Admit date - 05/16/2014   Admitting Physician Leroy Sea, MD  Outpatient Primary MD for the patient is Lilyan Punt, MD  LOS -    Chief Complaint  Patient presents with  . Weakness      Admission history of present illness/brief narrative: Kimberly Weiss is a 34 y.o. female, with history of chronic back pain and narcotic dependence, hypothyroidism, recent admission and discharge for Severe upper GI bleed with blood loss treated anemia secondary to duodenal ulcer. Likely due to NSAID use. Status post exploratory laparotomy with oversew of duodenal ulcer by general surgery on 05/07/2014, was discharged home on Protonix and Carafate comes to the ER with chief complaints of diarrhea, nausea vomiting and generalized weakness starting yesterday.   In the ER workup consistent with severe dehydration with elevated lactic acid level, hypotension, patient started on aggressive IV fluid resuscitation,     Subjective:   Kimberly Weiss today has, No headache, No chest pain,  - No Nausea, No new weakness tingling or numbness, No Cough - SOB. Still complains of abdominal pain.  Assessment & Plan    Principal Problem:   Diarrhea Active Problems:   Hypovolemic shock   Tobacco abuse   Duodenal ulcer with hemorrhage   Chronic narcotic use   Abdominal pain/history of duodenal ulcer - Status post exploratory laparotomy with oversew of bleeding ulcer 05/07/14. - CT abdomen only showing postoperative changes. - Drop in hemoglobin most likely delusional, hemoglobin on discharge was 7.5, 11 on admission, trended down to 8.6 after hydration, Hemoccult positive, but most likely related to her recent GI bleed. - Continue with Protonix twice a day, monitor H&H closely, keep nothing by  mouth. - DC IV Flagyl.  Diarrhea - C. difficile negative, possible gastroenteritis. - Follow on GI pathogen - Continue with IV fluids  Chronic back pain - Chronic narcotic dependence  hypothyroidism - continue with Synthroid  Tobacco abuse - continue with nicotine patch  Code Status: Full  Family Communication: none at bedside  Disposition Plan: home when stable   Procedures  none   Consults   Surgery GI   Medications  Scheduled Meds: . cefTRIAXone (ROCEPHIN)  IV  1 g Intravenous Q24H  . clonazePAM  0.25 mg Oral BID  . feeding supplement (ENSURE COMPLETE)  237 mL Oral BID BM  . ferrous sulfate  325 mg Oral BID WC  . levothyroxine  50 mcg Oral QAC breakfast  . metronidazole  500 mg Intravenous Q8H  . nicotine  14 mg Transdermal Daily  . pantoprazole (PROTONIX) IV  40 mg Intravenous Q12H  . sucralfate  1 g Oral TID WC & HS  . venlafaxine XR  150 mg Oral Q breakfast   Continuous Infusions: . sodium chloride 125 mL/hr at 05/17/14 0253   PRN Meds:.guaiFENesin-dextromethorphan, HYDROcodone-acetaminophen, morphine injection, ondansetron **OR** ondansetron (ZOFRAN) IV, oxyCODONE-acetaminophen **AND** oxyCODONE, promethazine  DVT Prophylaxis   SCDs  Lab Results  Component Value Date   PLT 486* 05/17/2014    Antibiotics    Anti-infectives    Start     Dose/Rate Route Frequency Ordered Stop   05/17/14 1030  cefTRIAXone (ROCEPHIN) 1 g in dextrose 5 %  50 mL IVPB - Premix     1 g 100 mL/hr over 30 Minutes Intravenous Every 24 hours 05/17/14 1016     05/17/14 0000  metroNIDAZOLE (FLAGYL) IVPB 500 mg     500 mg 100 mL/hr over 60 Minutes Intravenous Every 8 hours 05/16/14 1906     05/16/14 1830  metroNIDAZOLE (FLAGYL) IVPB 500 mg  Status:  Discontinued     500 mg 100 mL/hr over 60 Minutes Intravenous Every 8 hours 05/16/14 1829 05/16/14 1905   05/16/14 1645  metroNIDAZOLE (FLAGYL) IVPB 500 mg     500 mg 100 mL/hr over 60 Minutes Intravenous  Once 05/16/14 1635  05/16/14 1752   05/16/14 1600  metroNIDAZOLE (FLAGYL) tablet 500 mg  Status:  Discontinued     500 mg Oral 3 times per day 05/16/14 1553 05/16/14 1829          Objective:   Filed Vitals:   05/16/14 2136 05/17/14 0518 05/17/14 0545 05/17/14 0618  BP: 95/49 88/56 84/44  92/64  Pulse: 92 61 59   Temp: 98.7 F (37.1 C) 98.4 F (36.9 C) 98.6 F (37 C)   TempSrc: Oral Oral Oral   Resp: 18  18   Height:  5\' 3"  (1.6 m)    Weight:  66.225 kg (146 lb)    SpO2: 100% 100% 100%     Wt Readings from Last 3 Encounters:  05/17/14 66.225 kg (146 lb)  05/14/14 69.536 kg (153 lb 4.8 oz)  02/04/14 68.493 kg (151 lb)     Intake/Output Summary (Last 24 hours) at 05/17/14 1439 Last data filed at 05/17/14 0915  Gross per 24 hour  Intake   2300 ml  Output      0 ml  Net   2300 ml     Physical Exam  Awake Alert, Oriented X 3, No new F.N deficits, Normal affect [place].AT,PERRAL Supple Neck,No JVD, No cervical lymphadenopathy appriciated.  Symmetrical Chest wall movement, Good air movement bilaterally, CTAB RRR,No Gallops,Rubs or new Murmurs, No Parasternal Heave +ve B.Sounds, Abd Soft, mild tenderness to palpation, No organomegaly appriciated, No rebound - guarding or rigidity. No Cyanosis, Clubbing or edema, No new Rash or bruise     Data Review   Micro Results Recent Results (from the past 240 hour(s))  Urine culture     Status: None   Collection Time: 05/09/14 11:45 AM  Result Value Ref Range Status   Specimen Description URINE, CLEAN CATCH  Final   Special Requests NONE  Final   Colony Count NO GROWTH Performed at [facility]   Final   Culture NO GROWTH Performed at [facility]   Final   Report Status 05/10/2014 FINAL  Final  Urine culture     Status: None   Collection Time: 05/10/14  7:18 AM  Result Value Ref Range Status   Specimen Description URINE, RANDOM  Final   Special Requests NONE  Final   Colony Count NO GROWTH Performed at [facility]   Final   Culture NO GROWTH Performed at [facility]   Final   Report Status 05/11/2014 FINAL  Final  Urine culture     Status: None   Collection Time: 05/12/14 10:40 PM  Result Value Ref Range Status   Specimen Description URINE, CLEAN CATCH  Final   Special Requests NONE  Final   Colony Count   Final    50,000 COLONIES/ML Performed at [facility]    Culture  Final    Multiple bacterial morphotypes present, none predominant. Suggest appropriate recollection if clinically indicated. Performed at [facility]    Report Status 05/14/2014 FINAL  Final  Clostridium Difficile by PCR     Status: None   Collection Time: 05/16/14  3:04 PM  Result Value Ref Range Status   C difficile by pcr NEGATIVE NEGATIVE Final    Radiology Reports Ct Abdomen Pelvis W Contrast  05/17/2014   CLINICAL DATA:  Abdominal pain, diarrhea, nausea/ vomiting. Status post exploratory laparotomy for duodenal ulcer repair on 05/07/2014.  EXAM: CT ABDOMEN AND PELVIS WITH CONTRAST  TECHNIQUE: Multidetector CT imaging of the abdomen and pelvis was performed using the standard protocol following bolus administration of intravenous contrast.  CONTRAST:  OMNIPAQUE IOHEXOL 300 MG/ML  SOLN  COMPARISON:  MRI abdomen dated 02/04/2014.  FINDINGS: Lower chest:  Lung bases are clear.  Hepatobiliary: Liver is notable for focal fat/altered perfusion along the falciform ligament. No suspicious/ enhancing lesions.  Gallbladder wall thickening/ edema. Associated localized gas along the superior wall/gallbladder fossa (series 2/images 19-20), in a linear configuration, related to prior drain tract.  Pancreas: Within normal limits.  Spleen: Within normal limits.  Adrenals/Urinary Tract: Adrenal glands are within normal limits.  Right kidney is within normal limits. Heterogeneous perfusion of the left kidney (series 2/image 28). No hydronephrosis.  Bladder is mildly thick-walled although underdistended.   Stomach/Bowel: Wall thickening/inflammatory changes along the anterior aspect of the gastric antrum/duodenal bulb (series 2/image 23), related to known gastroduodenal ulcer with recent repair.  Linear tract with localized gas along the anterior wall of the stomach/duodenum bulb (series 5/image 31).  No evidence of bowel obstruction.  Normal appendix.  Vascular/Lymphatic: No evidence of abdominal aortic aneurysm.  No suspicious abdominopelvic lymphadenopathy.  Reproductive: Uterus and bilateral ovaries are within normal limits.  Other: No abdominopelvic ascites.  Tiny foci of free air under the diaphragm (series 2/ image 15), likely postoperative.  Musculoskeletal: Degenerative changes at L5-S1.  IMPRESSION: Wall thickening/inflammatory changes along the anterior aspect of the gastric antrum/ duodenal bulb, related to known gastroduodenal ulcer with recent repair.  Associated localized gas along the anterior surgical bed and in the gallbladder fossa, in a linear configuration, related to recent drain tract.  Secondary gallbladder wall thickening/inflammatory changes.  Small volume free air under the diaphragm, postoperative.   Electronically Signed   By: Charline Bills M.D.   On: 05/17/2014 13:25    CBC  Recent Labs Lab 05/11/14 0500 05/12/14 0517 05/13/14 0545 05/14/14 0520 05/16/14 1330 05/17/14 0704  WBC 8.2 7.8 5.6  --  10.8* 7.8  HGB 7.8* 8.0* 7.5* 8.1* 11.2* 8.9*  HCT 23.9* 24.2* 23.3* 25.2* 33.9* 28.3*  PLT 279 348 368  --  643* 486*  MCV 89.2 89.6 90.0  --  88.5 91.0  MCH 29.1 29.6 29.0  --  29.2 28.6  MCHC 32.6 33.1 32.2  --  33.0 31.4  RDW 15.5 14.9 14.9  --  14.3 14.6  LYMPHSABS  --   --   --   --  1.5  --   MONOABS  --   --   --   --  1.0  --   EOSABS  --   --   --   --  0.0  --   BASOSABS  --   --   --   --  0.0  --     Chemistries   Recent Labs Lab 05/11/14 0500 05/12/14 0517 05/13/14 0545 05/16/14  1330 05/17/14 0704  NA 138 140 139 138 139  K 3.5 3.9 3.8 4.2 3.6    CL 106 108 106 104 114*  CO2 GLUCOSE 109* 106* 102* 109* 123*  BUN <5* <5* <5* 10 8  CREATININE 0.54 0.56 0.56 0.87 0.65  CALCIUM 8.1* 8.3* 8.1* 9.2 7.8*  MG  --   --  1.9  --   --    ------------------------------------------------------------------------------------------------------------------ estimated creatinine clearance is 90.6 mL/min (by C-G formula based on Cr of 0.65). ------------------------------------------------------------------------------------------------------------------ No results for input(s): HGBA1C in the last 72 hours. ------------------------------------------------------------------------------------------------------------------ No results for input(s): CHOL, HDL, LDLCALC, TRIG, CHOLHDL, LDLDIRECT in the last 72 hours. ------------------------------------------------------------------------------------------------------------------  Recent Labs  05/16/14 1737  TSH 1.069   ------------------------------------------------------------------------------------------------------------------ No results for input(s): VITAMINB12, FOLATE, FERRITIN, TIBC, IRON, RETICCTPCT in the last 72 hours.  Coagulation profile No results for input(s): INR, PROTIME in the last 168 hours.  No results for input(s): DDIMER in the last 72 hours.  Cardiac Enzymes No results for input(s): CKMB, TROPONINI, MYOGLOBIN in the last 168 hours.  Invalid input(s): CK ------------------------------------------------------------------------------------------------------------------ Invalid input(s): POCBNP     Time Spent in minutes   35 minutes   ELGERGAWY, DAWOOD M.D on 05/17/2014 at 2:39 PM  Between 7am to 7pm - Pager - [phone]  After 7pm go to [email or web address] - password [email or web address]  And look for the night coverage person covering for me after hours  [facility] Office  [phone]   **Disclaimer: This note may have been dictated with voice  recognition software. Similar sounding words can inadvertently be transcribed and this note may contain transcription errors which may not have been corrected upon publication of note.**

## 2014-05-18 LAB — BASIC METABOLIC PANEL
Anion gap: 7 (ref 5–15)
BUN: <5 mg/dL — ABNORMAL LOW (ref 6–23)
CO2: 21 mmol/L (ref 19–32)
Calcium: 8.2 mg/dL — ABNORMAL LOW (ref 8.4–10.5)
Chloride: 109 mmol/L (ref 96–112)
Creatinine, Ser: 0.68 mg/dL (ref 0.50–1.10)
GFR calc Af Amer: >90 mL/min (ref >90)
GFR calc non Af Amer: >90 mL/min (ref >90)
Glucose, Bld: 81 mg/dL (ref 70–99)
Potassium: 3.8 mmol/L (ref 3.5–5.1)
Sodium: 137 mmol/L (ref 135–145)

## 2014-05-18 LAB — CBC
HCT: 28 % — ABNORMAL LOW (ref 36.0–46.0)
Hemoglobin: 9 g/dL — ABNORMAL LOW (ref 12.0–15.0)
MCH: 29 pg (ref 26.0–34.0)
MCHC: 32.1 g/dL (ref 30.0–36.0)
MCV: 90.3 fL (ref 78.0–100.0)
Platelets: 488 10*3/uL — ABNORMAL HIGH (ref 150–400)
RBC: 3.1 MIL/uL — ABNORMAL LOW (ref 3.87–5.11)
RDW: 14.3 % (ref 11.5–15.5)
WBC: 12 10*3/uL — ABNORMAL HIGH (ref 4.0–10.5)

## 2014-05-18 LAB — URINALYSIS, ROUTINE W REFLEX MICROSCOPIC
Bilirubin Urine: NEGATIVE
Glucose, UA: NEGATIVE mg/dL
Hgb urine dipstick: NEGATIVE
Ketones, ur: 15 mg/dL — AB
Leukocytes, UA: NEGATIVE
Nitrite: NEGATIVE
Protein, ur: NEGATIVE mg/dL
Specific Gravity, Urine: 1.01 (ref 1.005–1.030)
Urobilinogen, UA: 0.2 mg/dL (ref 0.0–1.0)
pH: 6 (ref 5.0–8.0)

## 2014-05-18 LAB — PREGNANCY, URINE: Preg Test, Ur: NEGATIVE

## 2014-05-18 MED ORDER — SODIUM CHLORIDE 0.9 % IV SOLN
INTRAVENOUS | Status: DC
  Administered 2014-05-18 – 2014-05-19 (×4): via INTRAVENOUS

## 2014-05-18 MED ORDER — MORPHINE SULFATE 2 MG/ML IJ SOLN
2.0000 mg | INTRAMUSCULAR | Status: DC | PRN
Start: 2014-05-18 — End: 2014-05-19
  Administered 2014-05-18 – 2014-05-19 (×5): 2 mg via INTRAVENOUS
  Filled 2014-05-18 (×4): qty 1

## 2014-05-18 MED ORDER — PANTOPRAZOLE SODIUM 40 MG PO TBEC
40.0000 mg | DELAYED_RELEASE_TABLET | Freq: Two times a day (BID) | ORAL | Status: DC
  Administered 2014-05-18 – 2014-05-20 (×4): 40 mg via ORAL
  Filled 2014-05-18 (×4): qty 1

## 2014-05-18 NOTE — Progress Notes (Signed)
"  Kimberly Weiss" states she can keep down clr liq's as long as she has some Phenergan to prevent nausea.  No c/o pain at present.  Rather, she seems anxious, and states she's "scared" about the possibility of passing out again.  On exam, she has reproducible RUQ guarding and tenderness w/out peritoneal findings.  Wound appears to be healing well, and is w/out apparent herniation. She is in no distress.  IMPR:  Chronic pain tendencies, w/out objective evidence of complication of recent surgery.  PLAN:    1.  Offered reassurance that she doesn't have to worry about future ulcers and bleeding in the absence of ulcerogenic medications.    2.  As a safeguard, she should probably have lifelong ulcer prophylaxis, at least w/ an H2 blocker, once the duodenal ulcer has had a chance to heal on PPI therapy for a couple of months.    3.  Would favor endoscopic confirmation of healing 2 months from now--would refer back to Dr. Bosie ClosSchooler for that purpose.  4.  Advance diet as tolerated.  5.  Will follow at a distance; please call us at any time if more immediate input is needed.   Florencia Reasonsobert V. Laurianne Floresca, M.D. [phone]

## 2014-05-18 NOTE — Progress Notes (Signed)
Patient Demographics  Kimberly Weiss, is a 34 y.o. female, DOB - [birth year], ZOX:[id number]  Admit date - 05/16/2014   Admitting Physician Leroy Sea, MD  Outpatient Primary MD for the patient is LUKING,SCOTT, MD  LOS - 1   Chief Complaint  Patient presents with  . Weakness      Admission history of present illness/brief narrative: Kimberly Weiss is a 34 y.o. female, with history of chronic back pain and narcotic dependence, hypothyroidism, recent admission and discharge for Severe upper GI bleed with blood loss treated anemia secondary to duodenal ulcer. Likely due to NSAID use. Status post exploratory laparotomy with oversew of duodenal ulcer by general surgery on 05/07/2014, was discharged home on Protonix and Carafate comes to the ER with chief complaints of diarrhea, nausea vomiting and generalized weakness starting yesterday.   In the ER workup consistent with severe dehydration with elevated lactic acid level, hypotension, patient started on aggressive IV fluid resuscitation,     Subjective:   Kimberly Weiss today has, No headache, No chest pain,  - No Nausea, No new weakness tingling or numbness, No Cough - SOB. Still complains of abdominal pain.  Assessment & Plan    Principal Problem:   Diarrhea Active Problems:   Hypovolemic shock   Tobacco abuse   Duodenal ulcer with hemorrhage   Chronic narcotic use   Abdominal pain/history of duodenal ulcer - Status post exploratory laparotomy with oversew of bleeding ulcer 05/07/14. - CT abdomen only showing postoperative changes. - Drop in hemoglobin most likely delusional, hemoglobin on discharge was 7.5, 11 on admission, trended down to 8.6 after hydration, Hemoccult positive, but most likely related to her recent GI bleed. - Continue with Protonix twice a day, monitor H&H closely, keep nothing by  mouth. - DC IV Flagyl. - Advanced to clear liquid diet.  Diarrhea - C. difficile negative, possible gastroenteritis. - Follow on GI pathogen - Continue with IV fluids  Chronic back pain - Chronic narcotic dependence  hypothyroidism - continue with Synthroid  Tobacco abuse - continue with nicotine patch  Code Status: Full  Family Communication: none at bedside  Disposition Plan: home when stable   Procedures  none   Consults   Surgery GI   Medications  Scheduled Meds: . clonazePAM  0.25 mg Oral BID  . feeding supplement (ENSURE COMPLETE)  237 mL Oral BID BM  . ferrous sulfate  325 mg Oral BID WC  . levothyroxine  50 mcg Oral QAC breakfast  . nicotine  14 mg Transdermal Daily  . pantoprazole (PROTONIX) IV  40 mg Intravenous Q12H  . sucralfate  1 g Oral TID WC & HS  . venlafaxine XR  150 mg Oral Q breakfast   Continuous Infusions: . sodium chloride 100 mL/hr at 05/18/14 1422   PRN Meds:.guaiFENesin-dextromethorphan, HYDROcodone-acetaminophen, morphine injection, ondansetron **OR** ondansetron (ZOFRAN) IV, oxyCODONE-acetaminophen **AND** oxyCODONE, promethazine  DVT Prophylaxis   SCDs  Lab Results  Component Value Date   PLT 488* 05/18/2014    Antibiotics    Anti-infectives    Start     Dose/Rate Route Frequency Ordered Stop   05/17/14 1030  cefTRIAXone (ROCEPHIN) 1 g in dextrose 5 % 50 mL IVPB - Premix  Status:  Discontinued  1 g 100 mL/hr over 30 Minutes Intravenous Every 24 hours 05/17/14 1016 05/17/14 1442   05/17/14 0000  metroNIDAZOLE (FLAGYL) IVPB 500 mg  Status:  Discontinued     500 mg 100 mL/hr over 60 Minutes Intravenous Every 8 hours 05/16/14 1906 05/17/14 1441   05/16/14 1830  metroNIDAZOLE (FLAGYL) IVPB 500 mg  Status:  Discontinued     500 mg 100 mL/hr over 60 Minutes Intravenous Every 8 hours 05/16/14 1829 05/16/14 1905   05/16/14 1645  metroNIDAZOLE (FLAGYL) IVPB 500 mg     500 mg 100 mL/hr over 60 Minutes Intravenous  Once  05/16/14 1635 05/16/14 1752   05/16/14 1600  metroNIDAZOLE (FLAGYL) tablet 500 mg  Status:  Discontinued     500 mg Oral 3 times per day 05/16/14 1553 05/16/14 1829          Objective:   Filed Vitals:   05/17/14 2317 05/18/14 0202 05/18/14 0620 05/18/14 1441  BP: 97/57 100/59 93/59 96/53   Pulse: 82     Temp: 98.9 F (37.2 C)  99 F (37.2 C) 98 F (36.7 C)  TempSrc: Oral  Oral Oral  Resp: 18  18 16   Height:      Weight:   66.86 kg (147 lb 6.4 oz)   SpO2: 100%  99% 97%    Wt Readings from Last 3 Encounters:  05/18/14 66.86 kg (147 lb 6.4 oz)  05/14/14 69.536 kg (153 lb 4.8 oz)  02/04/14 68.493 kg (151 lb)     Intake/Output Summary (Last 24 hours) at 05/18/14 1619 Last data filed at 05/18/14 1527  Gross per 24 hour  Intake   1350 ml  Output    750 ml  Net    600 ml     Physical Exam  Awake Alert, Oriented X 3, No new F.N deficits, Normal affect [place].AT,PERRAL Supple Neck,No JVD, No cervical lymphadenopathy appriciated.  Symmetrical Chest wall movement, Good air movement bilaterally, CTAB RRR,No Gallops,Rubs or new Murmurs, No Parasternal Heave +ve B.Sounds, Abd Soft, mild tenderness to palpation, midline surgical scar healed nicely, No organomegaly appriciated, No rebound - guarding or rigidity. No Cyanosis, Clubbing or edema, No new Rash or bruise     Data Review   Micro Results Recent Results (from the past 240 hour(s))  Urine culture     Status: None   Collection Time: 05/09/14 11:45 AM  Result Value Ref Range Status   Specimen Description URINE, CLEAN CATCH  Final   Special Requests NONE  Final   Colony Count NO GROWTH Performed at [facility]   Final   Culture NO GROWTH Performed at [facility]   Final   Report Status 05/10/2014 FINAL  Final  Urine culture     Status: None   Collection Time: 05/10/14  7:18 AM  Result Value Ref Range Status   Specimen Description URINE, RANDOM  Final   Special Requests NONE  Final   Colony  Count NO GROWTH Performed at [facility]   Final   Culture NO GROWTH Performed at [facility]   Final   Report Status 05/11/2014 FINAL  Final  Urine culture     Status: None   Collection Time: 05/12/14 10:40 PM  Result Value Ref Range Status   Specimen Description URINE, CLEAN CATCH  Final   Special Requests NONE  Final   Colony Count   Final    50,000 COLONIES/ML Performed at [facility]    Culture  Final    Multiple bacterial morphotypes present, none predominant. Suggest appropriate recollection if clinically indicated. Performed at [facility]    Report Status 05/14/2014 FINAL  Final  Clostridium Difficile by PCR     Status: None   Collection Time: 05/16/14  3:04 PM  Result Value Ref Range Status   C difficile by pcr NEGATIVE NEGATIVE Final    Radiology Reports Ct Abdomen Pelvis W Contrast  05/17/2014   CLINICAL DATA:  Abdominal pain, diarrhea, nausea/ vomiting. Status post exploratory laparotomy for duodenal ulcer repair on 05/07/2014.  EXAM: CT ABDOMEN AND PELVIS WITH CONTRAST  TECHNIQUE: Multidetector CT imaging of the abdomen and pelvis was performed using the standard protocol following bolus administration of intravenous contrast.  CONTRAST:  OMNIPAQUE IOHEXOL 300 MG/ML  SOLN  COMPARISON:  MRI abdomen dated 02/04/2014.  FINDINGS: Lower chest:  Lung bases are clear.  Hepatobiliary: Liver is notable for focal fat/altered perfusion along the falciform ligament. No suspicious/ enhancing lesions.  Gallbladder wall thickening/ edema. Associated localized gas along the superior wall/gallbladder fossa (series 2/images 19-20), in a linear configuration, related to prior drain tract.  Pancreas: Within normal limits.  Spleen: Within normal limits.  Adrenals/Urinary Tract: Adrenal glands are within normal limits.  Right kidney is within normal limits. Heterogeneous perfusion of the left kidney (series 2/image 28). No hydronephrosis.  Bladder is  mildly thick-walled although underdistended.  Stomach/Bowel: Wall thickening/inflammatory changes along the anterior aspect of the gastric antrum/duodenal bulb (series 2/image 23), related to known gastroduodenal ulcer with recent repair.  Linear tract with localized gas along the anterior wall of the stomach/duodenum bulb (series 5/image 31).  No evidence of bowel obstruction.  Normal appendix.  Vascular/Lymphatic: No evidence of abdominal aortic aneurysm.  No suspicious abdominopelvic lymphadenopathy.  Reproductive: Uterus and bilateral ovaries are within normal limits.  Other: No abdominopelvic ascites.  Tiny foci of free air under the diaphragm (series 2/ image 15), likely postoperative.  Musculoskeletal: Degenerative changes at L5-S1.  IMPRESSION: Wall thickening/inflammatory changes along the anterior aspect of the gastric antrum/ duodenal bulb, related to known gastroduodenal ulcer with recent repair.  Associated localized gas along the anterior surgical bed and in the gallbladder fossa, in a linear configuration, related to recent drain tract.  Secondary gallbladder wall thickening/inflammatory changes.  Small volume free air under the diaphragm, postoperative.   Electronically Signed   By: Charline Bills M.D.   On: 05/17/2014 13:25    CBC  Recent Labs Lab 05/12/14 0517 05/13/14 0545 05/14/14 0520 05/16/14 1330 05/17/14 0704 05/18/14 0500  WBC 7.8 5.6  --  10.8* 7.8 12.0*  HGB 8.0* 7.5* 8.1* 11.2* 8.9* 9.0*  HCT 24.2* 23.3* 25.2* 33.9* 28.3* 28.0*  PLT 348 368  --  643* 486* 488*  MCV 89.6 90.0  --  88.5 91.0 90.3  MCH 29.6 29.0  --  29.2 28.6 29.0  MCHC 33.1 32.2  --  33.0 31.4 32.1  RDW 14.9 14.9  --  14.3 14.6 14.3  LYMPHSABS  --   --   --  1.5  --   --   MONOABS  --   --   --  1.0  --   --   EOSABS  --   --   --  0.0  --   --   BASOSABS  --   --   --  0.0  --   --     Chemistries   Recent Labs Lab 05/12/14 0517 05/13/14 0545 05/16/14 1330 05/17/14  1610 05/18/14 0500   NA 140 139 138 139 137  K 3.9 3.8 4.2 3.6 3.8  CL 108 106 104 114* 109  CO2 GLUCOSE 106* 102* 109* 123* 81  BUN <5* <5* 10 8 <5*  CREATININE 0.56 0.56 0.87 0.65 0.68  CALCIUM 8.3* 8.1* 9.2 7.8* 8.2*  MG  --  1.9  --   --   --    ------------------------------------------------------------------------------------------------------------------ estimated creatinine clearance is 91 mL/min (by C-G formula based on Cr of 0.68). ------------------------------------------------------------------------------------------------------------------ No results for input(s): HGBA1C in the last 72 hours. ------------------------------------------------------------------------------------------------------------------ No results for input(s): CHOL, HDL, LDLCALC, TRIG, CHOLHDL, LDLDIRECT in the last 72 hours. ------------------------------------------------------------------------------------------------------------------  Recent Labs  05/16/14 1737  TSH 1.069   ------------------------------------------------------------------------------------------------------------------ No results for input(s): VITAMINB12, FOLATE, FERRITIN, TIBC, IRON, RETICCTPCT in the last 72 hours.  Coagulation profile No results for input(s): INR, PROTIME in the last 168 hours.  No results for input(s): DDIMER in the last 72 hours.  Cardiac Enzymes No results for input(s): CKMB, TROPONINI, MYOGLOBIN in the last 168 hours.  Invalid input(s): CK ------------------------------------------------------------------------------------------------------------------ Invalid input(s): POCBNP     Time Spent in minutes   35 minutes   Kendra Woolford M.D on 05/18/2014 at 4:19 PM  Between 7am to 7pm - Pager - [phone]  After 7pm go to [email or web address] - password [email or web address]  And look for the night coverage person covering for me after hours  [facility] Office  [phone]   **Disclaimer:  This note may have been dictated with voice recognition software. Similar sounding words can inadvertently be transcribed and this note may contain transcription errors which may not have been corrected upon publication of note.**

## 2014-05-18 NOTE — Progress Notes (Signed)
I have read and reviewed CSW Intern Tonette BihariJeneya McLean's note and I agree with her assessment.  Pollyann SavoyJody Sabriya Yono, LCSW

## 2014-05-18 NOTE — Progress Notes (Signed)
Patient ID: Kimberly Weiss, female   DOB: 07/28/1980, 34 y.o.   MRN: [id number]    Subjective: Patient still with RUQ pain.  Still has some nausea, but no emesis.  Had a BM last night  Objective: Vital signs in last 24 hours: Temp:  [98.8 F (37.1 C)-99 F (37.2 C)] 99 F (37.2 C) (03/19 0620) Pulse Rate:  [76-82] 82 (03/18 2317) Resp:  [16-18] 18 (03/19 0620) BP: (93-100)/(57-60) 93/59 mmHg (03/19 0620) SpO2:  [99 %-100 %] 99 % (03/19 0620) Weight:  [66.86 kg (147 lb 6.4 oz)] 66.86 kg (147 lb 6.4 oz) (03/19 0620) Last BM Date: 05/17/14  Intake/Output from previous day: 03/18 0701 - 03/19 0700 In: 1290 [P.O.:240; I.V.:1050] Out: 450 [Urine:450] Intake/Output this shift: Total I/O In: -  Out: 300 [Urine:300]  PE: Abd: soft, seems minimally tender, despite what she says verbally, +BS, ND, incision healing well Heart: regular Lungs: CTAB  Lab Results:   Recent Labs  05/17/14 0704 05/18/14 0500  WBC 7.8 12.0*  HGB 8.9* 9.0*  HCT 28.3* 28.0*  PLT 486* 488*   BMET  Recent Labs  05/17/14 0704 05/18/14 0500  NA 139 137  K 3.6 3.8  CL 114* 109  CO2 21 21  GLUCOSE 123* 81  BUN 8 <5*  CREATININE 0.65 0.68  CALCIUM 7.8* 8.2*   PT/INR No results for input(s): LABPROT, INR in the last 72 hours. CMP     Component Value Date/Time   NA 137 05/18/2014 0500   K 3.8 05/18/2014 0500   CL 109 05/18/2014 0500   CO2 21 05/18/2014 0500   GLUCOSE 81 05/18/2014 0500   BUN <5* 05/18/2014 0500   CREATININE 0.68 05/18/2014 0500   CALCIUM 8.2* 05/18/2014 0500   PROT 4.6* 05/04/2014 0518   ALBUMIN 2.3* 05/04/2014 0518   AST 13 05/04/2014 0518   ALT 11 05/04/2014 0518   ALKPHOS 59 05/04/2014 0518   BILITOT 0.5 05/04/2014 0518   GFRNONAA >90 05/18/2014 0500   GFRAA >90 05/18/2014 0500   Lipase     Component Value Date/Time   LIPASE 28 04/26/2014 2314       Studies/Results: Ct Abdomen Pelvis W Contrast  05/17/2014   CLINICAL DATA:  Abdominal pain, diarrhea,  nausea/ vomiting. Status post exploratory laparotomy for duodenal ulcer repair on 05/07/2014.  EXAM: CT ABDOMEN AND PELVIS WITH CONTRAST  TECHNIQUE: Multidetector CT imaging of the abdomen and pelvis was performed using the standard protocol following bolus administration of intravenous contrast.  CONTRAST:  100mL OMNIPAQUE IOHEXOL 300 MG/ML  SOLN  COMPARISON:  MRI abdomen dated 02/04/2014.  FINDINGS: Lower chest:  Lung bases are clear.  Hepatobiliary: Liver is notable for focal fat/altered perfusion along the falciform ligament. No suspicious/ enhancing lesions.  Gallbladder wall thickening/ edema. Associated localized gas along the superior wall/gallbladder fossa (series 2/images 19-20), in a linear configuration, related to prior drain tract.  Pancreas: Within normal limits.  Spleen: Within normal limits.  Adrenals/Urinary Tract: Adrenal glands are within normal limits.  Right kidney is within normal limits. Heterogeneous perfusion of the left kidney (series 2/image 28). No hydronephrosis.  Bladder is mildly thick-walled although underdistended.  Stomach/Bowel: Wall thickening/inflammatory changes along the anterior aspect of the gastric antrum/duodenal bulb (series 2/image 23), related to known gastroduodenal ulcer with recent repair.  Linear tract with localized gas along the anterior wall of the stomach/duodenum bulb (series 5/image 31).  No evidence of bowel obstruction.  Normal appendix.  Vascular/Lymphatic: No evidence of abdominal aortic aneurysm.  No suspicious abdominopelvic lymphadenopathy.  Reproductive: Uterus and bilateral ovaries are within normal limits.  Other: No abdominopelvic ascites.  Tiny foci of free air under the diaphragm (series 2/ image 15), likely postoperative.  Musculoskeletal: Degenerative changes at L5-S1.  IMPRESSION: Wall thickening/inflammatory changes along the anterior aspect of the gastric antrum/ duodenal bulb, related to known gastroduodenal ulcer with recent repair.   Associated localized gas along the anterior surgical bed and in the gallbladder fossa, in a linear configuration, related to recent drain tract.  Secondary gallbladder wall thickening/inflammatory changes.  Small volume free air under the diaphragm, postoperative.   Electronically Signed   By: Charline Bills M.D.   On: 05/17/2014 13:25    Anti-infectives: Anti-infectives    Start     Dose/Rate Route Frequency Ordered Stop   05/17/14 1030  cefTRIAXone (ROCEPHIN) 1 g in dextrose 5 % 50 mL IVPB - Premix  Status:  Discontinued     1 g 100 mL/hr over 30 Minutes Intravenous Every 24 hours 05/17/14 1016 05/17/14 1442   05/17/14 0000  metroNIDAZOLE (FLAGYL) IVPB 500 mg  Status:  Discontinued     500 mg 100 mL/hr over 60 Minutes Intravenous Every 8 hours 05/16/14 1906 05/17/14 1441   05/16/14 1830  metroNIDAZOLE (FLAGYL) IVPB 500 mg  Status:  Discontinued     500 mg 100 mL/hr over 60 Minutes Intravenous Every 8 hours 05/16/14 1829 05/16/14 1905   05/16/14 1645  metroNIDAZOLE (FLAGYL) IVPB 500 mg     500 mg 100 mL/hr over 60 Minutes Intravenous  Once 05/16/14 1635 05/16/14 1752   05/16/14 1600  metroNIDAZOLE (FLAGYL) tablet 500 mg  Status:  Discontinued     500 mg Oral 3 times per day 05/16/14 1553 05/16/14 1829       Assessment/Plan  POD 11, s/p ex lap with oversew of bleeding duodenal ulcer -CT scan showed post surgical changes to the RUQ as generally expected after this type of an operation.  She likely still has pain due to a large operation and the fact that she still has an ulcer  -cont PPI and carafate -ok to try clears today from our standpoint.  LOS: 1 day    Aricela Bertagnolli E 05/18/2014, 9:11 AM Pager: [phone]

## 2014-05-18 NOTE — Clinical Social Work Psychosocial (Deleted)
Clinical Social Work Department BRIEF PSYCHOSOCIAL ASSESSMENT 05/18/2014  Patient:  Kimberly Weiss,Kimberly Weiss     Account Number:  [id number]     Admit date:  05/16/2014  Clinical Social Worker:  Tamzin Bertling, CLINICAL SOCIAL WORKER  Date/Time:  05/18/2014 03:29 PM  Referred by:  Physician  Date Referred:  05/18/2014 Referred for  SNF Placement   Other Referral:   Interview type:  Patient Other interview type:    PSYCHOSOCIAL DATA Living Status:  HUSBAND Admitted from facility:   Level of care:   Primary support name:  Kimberly Weiss Primary support relationship to patient:  SPOUSE Degree of support available:   Patient states husband is supportive. He was not currently present at bedside.    CURRENT CONCERNS Current Concerns  Post-Acute Placement   Other Concerns:    SOCIAL WORK ASSESSMENT / PLAN CSW intern spoke with patient about possible SNF placement. Patient had already spoken with admission director, Rhonda Draper, at [facility] and wants to go to [facility] for rehab. Patient agrees for CSW Intern to complete FL-2 and initiate SNF search for bed offers within [place], which would include [facility]. CSW to follow up with patient to provide available bed offers. CSW remains available for support and to facilitate patient discharge need once medically ready.   Assessment/plan status:  Psychosocial Support/Ongoing Assessment of Needs Other assessment/ plan:   Information/referral to community resources:   CSW to provide patient with facility list once bed offers are available.    PATIENT'S/FAMILY'S RESPONSE TO PLAN OF CARE: Patient alert and oriented x3 in the bed. No family or friends currently present at bedside. Patient engaged in CSW Intern assessment and agreeable with discharge plan. Patient understanding of Social Work role and appreciative of support.        

## 2014-05-19 LAB — CBC
HCT: 24.8 % — ABNORMAL LOW (ref 36.0–46.0)
Hemoglobin: 7.9 g/dL — ABNORMAL LOW (ref 12.0–15.0)
MCH: 28.7 pg (ref 26.0–34.0)
MCHC: 31.9 g/dL (ref 30.0–36.0)
MCV: 90.2 fL (ref 78.0–100.0)
Platelets: 393 10*3/uL (ref 150–400)
RBC: 2.75 MIL/uL — ABNORMAL LOW (ref 3.87–5.11)
RDW: 14.5 % (ref 11.5–15.5)
WBC: 6.7 10*3/uL (ref 4.0–10.5)

## 2014-05-19 LAB — BASIC METABOLIC PANEL
Anion gap: 5 (ref 5–15)
BUN: <5 mg/dL — ABNORMAL LOW (ref 6–23)
CO2: 24 mmol/L (ref 19–32)
Calcium: 7.9 mg/dL — ABNORMAL LOW (ref 8.4–10.5)
Chloride: 111 mmol/L (ref 96–112)
Creatinine, Ser: 0.65 mg/dL (ref 0.50–1.10)
GFR calc Af Amer: >90 mL/min (ref >90)
GFR calc non Af Amer: >90 mL/min (ref >90)
Glucose, Bld: 89 mg/dL (ref 70–99)
Potassium: 3.5 mmol/L (ref 3.5–5.1)
Sodium: 140 mmol/L (ref 135–145)

## 2014-05-19 MED ORDER — SODIUM CHLORIDE 0.9 % IV BOLUS (SEPSIS)
500.0000 mL | Freq: Once | INTRAVENOUS | Status: AC
  Administered 2014-05-19: 500 mL via INTRAVENOUS

## 2014-05-19 MED ORDER — SACCHAROMYCES BOULARDII 250 MG PO CAPS
250.0000 mg | ORAL_CAPSULE | Freq: Two times a day (BID) | ORAL | Status: DC
  Administered 2014-05-19 – 2014-05-20 (×3): 250 mg via ORAL
  Filled 2014-05-19 (×4): qty 1

## 2014-05-19 MED ORDER — ONDANSETRON HCL 4 MG/2ML IJ SOLN: 4.0000 mg | Freq: Four times a day (QID) | INTRAMUSCULAR | Status: DC | PRN

## 2014-05-19 NOTE — Progress Notes (Signed)
Per weekend handoff report- MSW Intern Alena Bills met with patient on 05/17/14 to discuss transportation concerns.  As the discussion progressed other issues were identified and the intern provided patient with information re: Disability Assistance, [place] facilities list, Medicaid transportation list for [facility] and a [place] application. CSW discussed with MSW Intern's Supervisor- Creta Levin, LCSW and request was made to follow up with Alena Bills to complete a full Psychosocial Assessment for patient. This CSW will sign off.  Lorie Phenix. Pauline Good, [place]

## 2014-05-19 NOTE — Progress Notes (Signed)
Patient ID: Kimberly Weiss, female   DOB: 04/13/1980, 34 y.o.   MRN: [id number]    Subjective: Pt c/o diarrhea, but c diff negative.  On a regular diet this morning.  Objective: Vital signs in last 24 hours: Temp:  [98 F (36.7 C)-99.2 F (37.3 C)] 98.3 F (36.8 C) (03/20 0523) Pulse Rate:  [71-74] 71 (03/20 0523) Resp:  [16-18] 18 (03/20 0523) BP: (72-96)/(39-60) 90/50 mmHg (03/20 0602) SpO2:  [97 %-99 %] 99 % (03/20 0523) Weight:  [69 kg (152 lb 1.9 oz)] 69 kg (152 lb 1.9 oz) (03/20 0523) Last BM Date: 05/18/14  Intake/Output from previous day: 03/19 0701 - 03/20 0700 In: 2406.7 [P.O.:300; I.V.:2106.7] Out: 2150 [Urine:2150] Intake/Output this shift:    PE: Abd: soft, minimally tender, +BS, ND Heart: regular  Lab Results:   Recent Labs  05/17/14 0704 05/18/14 0500  WBC 7.8 12.0*  HGB 8.9* 9.0*  HCT 28.3* 28.0*  PLT 486* 488*   BMET  Recent Labs  05/17/14 0704 05/18/14 0500  NA 139 137  K 3.6 3.8  CL 114* 109  CO2 21 21  GLUCOSE 123* 81  BUN 8 <5*  CREATININE 0.65 0.68  CALCIUM 7.8* 8.2*   PT/INR No results for input(s): LABPROT, INR in the last 72 hours. CMP     Component Value Date/Time   NA 137 05/18/2014 0500   K 3.8 05/18/2014 0500   CL 109 05/18/2014 0500   CO2 21 05/18/2014 0500   GLUCOSE 81 05/18/2014 0500   BUN <5* 05/18/2014 0500   CREATININE 0.68 05/18/2014 0500   CALCIUM 8.2* 05/18/2014 0500   PROT 4.6* 05/04/2014 0518   ALBUMIN 2.3* 05/04/2014 0518   AST 13 05/04/2014 0518   ALT 11 05/04/2014 0518   ALKPHOS 59 05/04/2014 0518   BILITOT 0.5 05/04/2014 0518   GFRNONAA >90 05/18/2014 0500   GFRAA >90 05/18/2014 0500   Lipase     Component Value Date/Time   LIPASE 28 04/26/2014 2314       Studies/Results: Ct Abdomen Pelvis W Contrast  05/17/2014   CLINICAL DATA:  Abdominal pain, diarrhea, nausea/ vomiting. Status post exploratory laparotomy for duodenal ulcer repair on 05/07/2014.  EXAM: CT ABDOMEN AND PELVIS WITH  CONTRAST  TECHNIQUE: Multidetector CT imaging of the abdomen and pelvis was performed using the standard protocol following bolus administration of intravenous contrast.  CONTRAST:  100mL OMNIPAQUE IOHEXOL 300 MG/ML  SOLN  COMPARISON:  MRI abdomen dated 02/04/2014.  FINDINGS: Lower chest:  Lung bases are clear.  Hepatobiliary: Liver is notable for focal fat/altered perfusion along the falciform ligament. No suspicious/ enhancing lesions.  Gallbladder wall thickening/ edema. Associated localized gas along the superior wall/gallbladder fossa (series 2/images 19-20), in a linear configuration, related to prior drain tract.  Pancreas: Within normal limits.  Spleen: Within normal limits.  Adrenals/Urinary Tract: Adrenal glands are within normal limits.  Right kidney is within normal limits. Heterogeneous perfusion of the left kidney (series 2/image 28). No hydronephrosis.  Bladder is mildly thick-walled although underdistended.  Stomach/Bowel: Wall thickening/inflammatory changes along the anterior aspect of the gastric antrum/duodenal bulb (series 2/image 23), related to known gastroduodenal ulcer with recent repair.  Linear tract with localized gas along the anterior wall of the stomach/duodenum bulb (series 5/image 31).  No evidence of bowel obstruction.  Normal appendix.  Vascular/Lymphatic: No evidence of abdominal aortic aneurysm.  No suspicious abdominopelvic lymphadenopathy.  Reproductive: Uterus and bilateral ovaries are within normal limits.  Other: No abdominopelvic ascites.  Tiny  foci of free air under the diaphragm (series 2/ image 15), likely postoperative.  Musculoskeletal: Degenerative changes at L5-S1.  IMPRESSION: Wall thickening/inflammatory changes along the anterior aspect of the gastric antrum/ duodenal bulb, related to known gastroduodenal ulcer with recent repair.  Associated localized gas along the anterior surgical bed and in the gallbladder fossa, in a linear configuration, related to recent  drain tract.  Secondary gallbladder wall thickening/inflammatory changes.  Small volume free air under the diaphragm, postoperative.   Electronically Signed   By: Charline Bills M.D.   On: 05/17/2014 13:25    Anti-infectives: Anti-infectives    Start     Dose/Rate Route Frequency Ordered Stop   05/17/14 1030  cefTRIAXone (ROCEPHIN) 1 g in dextrose 5 % 50 mL IVPB - Premix  Status:  Discontinued     1 g 100 mL/hr over 30 Minutes Intravenous Every 24 hours 05/17/14 1016 05/17/14 1442   05/17/14 0000  metroNIDAZOLE (FLAGYL) IVPB 500 mg  Status:  Discontinued     500 mg 100 mL/hr over 60 Minutes Intravenous Every 8 hours 05/16/14 1906 05/17/14 1441   05/16/14 1830  metroNIDAZOLE (FLAGYL) IVPB 500 mg  Status:  Discontinued     500 mg 100 mL/hr over 60 Minutes Intravenous Every 8 hours 05/16/14 1829 05/16/14 1905   05/16/14 1645  metroNIDAZOLE (FLAGYL) IVPB 500 mg     500 mg 100 mL/hr over 60 Minutes Intravenous  Once 05/16/14 1635 05/16/14 1752   05/16/14 1600  metroNIDAZOLE (FLAGYL) tablet 500 mg  Status:  Discontinued     500 mg Oral 3 times per day 05/16/14 1553 05/16/14 1829       Assessment/Plan   POD 12, s/p ex lap with oversew of bleeding duodenal ulcer -cont PPI and carafate -on regular diet.  Patient is surgically stable when felt medically stable for dc home.  She should have follow up with Dr. Janee Morn already scheduled since last admission.  No other recommendations at this time.  LOS: 2 days    Chioke Noxon E 05/19/2014, 9:17 AM Pager: [phone]

## 2014-05-19 NOTE — Progress Notes (Addendum)
Pt reports still having diarrhea 3-4 times per day last two days. Stools have been witnessed to be loose.

## 2014-05-19 NOTE — Progress Notes (Signed)
Patient states she is having "continuous" diarrhea although on further questioning, it sounds as though yesterday she only had 4 bowel movements. They are loose in character. This morning, there was some blood with one of her bowel movements, as noted in the nurse's progress note. There has been a 1 g drop in hemoglobin, but no frank or overt active GI bleeding. GI pathogen panel is still pending, but C. difficile is negative. The patient is now on a soft diet and tolerating that okay. She continues to have pain and nausea and is quite focused on getting Phenergan and would like to be back on Dilaudid, which she was on during her previous hospitalization. On exam, she is lying quietly in bed, in no distress. There is still right upper quadrant tenderness that seems reproducible. The abdomen is nondistended and soft.  Impression:  1. Abdominal pain approximately 11 days status post emergency ulcer oversewing 2. Previous recent history of refractory GI bleeding from duodenal ulcer, unable to be stopped by endoscopy or embolization and requiring duodenal ulcer oversewing as noted above 3. Diarrhea, recently on multiple antibiotics 4. Minimal blood with stool today, and 1 g drop in hemoglobin (felt most likely to be equilibration) 5. Nausea, which could be related to ulcer healing, iron supplementation, or recent metronidazole  Recommendations:  1. Continue aggressive antipeptic therapy 2. Monitor hemoglobin in view of recent minimal hematochezia, and consider possible sigmoidoscopy if it persists 3. Add probiotics which may help with diarrhea especially in view of recent antibiotic exposure 4. Consider temporary cessation of iron supplementation, which could be contributing to her nausea  Kimberly Weiss, M.D. [phone]

## 2014-05-19 NOTE — Progress Notes (Addendum)
Patient Demographics  Kimberly Weiss, is a 34 y.o. female, DOB - [birth year], ZOX:[id number]  Admit date - 05/16/2014   Admitting Physician Leroy Sea, MD  Outpatient Primary MD for the patient is LUKING,SCOTT, MD  LOS - 2   Chief Complaint  Patient presents with  . Weakness      Admission history of present illness/brief narrative: Kimberly Weiss is a 34 y.o. female, with history of chronic back pain and narcotic dependence, hypothyroidism, recent admission and discharge for Severe upper GI bleed with blood loss treated anemia secondary to duodenal ulcer. Likely due to NSAID use. Status post exploratory laparotomy with oversew of duodenal ulcer by general surgery on 05/07/2014, was discharged home on Protonix and Carafate comes to the ER with chief complaints of diarrhea, nausea vomiting and generalized weakness starting yesterday.   In the ER workup consistent with severe dehydration with elevated lactic acid level, hypotension, patient started on aggressive IV fluid resuscitation,  Subjective:   Kimberly Weiss today has, No headache, No chest pain,  - No Nausea, No new weakness tingling or numbness, No Cough - SOB. Still complains of abdominal pain.  Assessment & Plan    Principal Problem:   Diarrhea Active Problems:   Hypovolemic shock   Tobacco abuse   Duodenal ulcer with hemorrhage   Chronic narcotic use   Abdominal pain/history of duodenal ulcer - Status post exploratory laparotomy with oversew of bleeding ulcer 05/07/14. - CT abdomen only showing postoperative changes. - Drop in hemoglobin most likely delusional, hemoglobin on discharge was 7.5, 11 on admission, trended down to 7.9  after hydration, Hemoccult positive, but most likely related to her recent GI bleed. - Continue with Protonix twice a day, monitor H&H closely,  - tolerated clear  liquid diet, and currently tolerating soft diet  Diarrhea - C. difficile negative, possible gastroenteritis. - Follow on GI pathogen  Hypotension - Patient has blood pressure on the lower side, this usually after IV pain medications, will stop IV morphine.  Chronic back pain - Chronic narcotic dependence  hypothyroidism - continue with Synthroid  Tobacco abuse - continue with nicotine patch  Code Status: Full  Family Communication: none at bedside  Disposition Plan: home when stable   Procedures  none   Consults   Surgery GI   Medications  Scheduled Meds: . clonazePAM  0.25 mg Oral BID  . feeding supplement (ENSURE COMPLETE)  237 mL Oral BID BM  . levothyroxine  50 mcg Oral QAC breakfast  . nicotine  14 mg Transdermal Daily  . pantoprazole  40 mg Oral BID  . saccharomyces boulardii  250 mg Oral BID  . sucralfate  1 g Oral TID WC & HS  . venlafaxine XR  150 mg Oral Q breakfast   Continuous Infusions:   PRN Meds:.guaiFENesin-dextromethorphan, HYDROcodone-acetaminophen, ondansetron **OR** ondansetron (ZOFRAN) IV, oxyCODONE-acetaminophen **AND** oxyCODONE, promethazine  DVT Prophylaxis   SCDs  Lab Results  Component Value Date   PLT 393 05/19/2014    Antibiotics    Anti-infectives    Start     Dose/Rate Route Frequency Ordered Stop   05/17/14 1030  cefTRIAXone (ROCEPHIN) 1 g in dextrose 5 % 50 mL IVPB - Premix  Status:  Discontinued     1  g 100 mL/hr over 30 Minutes Intravenous Every 24 hours 05/17/14 1016 05/17/14 1442   05/17/14 0000  metroNIDAZOLE (FLAGYL) IVPB 500 mg  Status:  Discontinued     500 mg 100 mL/hr over 60 Minutes Intravenous Every 8 hours 05/16/14 1906 05/17/14 1441   05/16/14 1830  metroNIDAZOLE (FLAGYL) IVPB 500 mg  Status:  Discontinued     500 mg 100 mL/hr over 60 Minutes Intravenous Every 8 hours 05/16/14 1829 05/16/14 1905   05/16/14 1645  metroNIDAZOLE (FLAGYL) IVPB 500 mg     500 mg 100 mL/hr over 60 Minutes Intravenous   Once 05/16/14 1635 05/16/14 1752   05/16/14 1600  metroNIDAZOLE (FLAGYL) tablet 500 mg  Status:  Discontinued     500 mg Oral 3 times per day 05/16/14 1553 05/16/14 1829          Objective:   Filed Vitals:   05/18/14 2130 05/19/14 0523 05/19/14 0602 05/19/14 1433  BP: 101/53  Pulse: 74 71    Temp: 99.2 F (37.3 C) 98.3 F (36.8 C)  99.7 F (37.6 C)  TempSrc: Oral Oral  Oral  Resp: Height:      Weight:  69 kg (152 lb 1.9 oz)    SpO2: 97% 99%  97%    Wt Readings from Last 3 Encounters:  05/19/14 69 kg (152 lb 1.9 oz)  05/14/14 69.536 kg (153 lb 4.8 oz)  02/04/14 68.493 kg (151 lb)     Intake/Output Summary (Last 24 hours) at 05/19/14 1512 Last data filed at 05/19/14 1115  Gross per 24 hour  Intake 2526.67 ml  Output   2350 ml  Net 176.67 ml     Physical Exam  Awake Alert, Oriented X 3, No new F.N deficits, Normal affect Ivy.AT,PERRAL Supple Neck,No JVD, No cervical lymphadenopathy appriciated.  Symmetrical Chest wall movement, Good air movement bilaterally, CTAB RRR,No Gallops,Rubs or new Murmurs, No Parasternal Heave +ve B.Sounds, Abd Soft, mild tenderness to palpation, midline surgical scar healed nicely, No organomegaly appriciated, No rebound - guarding or rigidity. No Cyanosis, Clubbing or edema, No new Rash or bruise     Data Review   Micro Results Recent Results (from the past 240 hour(s))  Urine culture     Status: None   Collection Time: 05/10/14  7:18 AM  Result Value Ref Range Status   Specimen Description URINE, RANDOM  Final   Special Requests NONE  Final   Colony Count NO GROWTH Performed at [facility]   Final   Culture NO GROWTH Performed at [facility]   Final   Report Status 05/11/2014 FINAL  Final  Urine culture     Status: None   Collection Time: 05/12/14 10:40 PM  Result Value Ref Range Status   Specimen Description URINE, CLEAN CATCH  Final   Special Requests NONE  Final    Colony Count   Final    50,000 COLONIES/ML Performed at [facility]   Final    Multiple bacterial morphotypes present, none predominant. Suggest appropriate recollection if clinically indicated. Performed at [facility]    Report Status 05/14/2014 FINAL  Final  Clostridium Difficile by PCR     Status: None   Collection Time: 05/16/14  3:04 PM  Result Value Ref Range Status   C difficile by pcr NEGATIVE NEGATIVE Final    Radiology Reports No results found.  CBC  Recent Labs Lab 05/13/14 0545  05/14/14 0520 05/16/14 1330 05/17/14 0704 05/18/14 0500 05/19/14 0610  WBC 5.6  --  10.8* 7.8 12.0* 6.7  HGB 7.5* 8.1* 11.2* 8.9* 9.0* 7.9*  HCT 23.3* 25.2* 33.9* 28.3* 28.0* 24.8*  PLT 368  --  643* 486* 488* 393  MCV 90.0  --  88.5 91.0 90.3 90.2  MCH 29.0  --  29.2 28.6 29.0 28.7  MCHC 32.2  --  33.0 31.4 32.1 31.9  RDW 14.9  --  14.3 14.6 14.3 14.5  LYMPHSABS  --   --  1.5  --   --   --   MONOABS  --   --  1.0  --   --   --   EOSABS  --   --  0.0  --   --   --   BASOSABS  --   --  0.0  --   --   --     Chemistries   Recent Labs Lab 05/13/14 0545 05/16/14 1330 05/17/14 0704 05/18/14 0500 05/19/14 0610  NA 139 138 139 137 140  K 3.8 4.2 3.6 3.8 3.5  CL 106 104 114* 109 111  CO2 27 22 21 21 24   GLUCOSE 102* 109* 123* 81 89  BUN <5* 10 8 <5* <5*  CREATININE 0.56 0.87 0.65 0.68 0.65  CALCIUM 8.1* 9.2 7.8* 8.2* 7.9*  MG 1.9  --   --   --   --    ------------------------------------------------------------------------------------------------------------------ estimated creatinine clearance is 92.3 mL/min (by C-G formula based on Cr of 0.65). ------------------------------------------------------------------------------------------------------------------ No results for input(s): HGBA1C in the last 72 hours. ------------------------------------------------------------------------------------------------------------------ No results for  input(s): CHOL, HDL, LDLCALC, TRIG, CHOLHDL, LDLDIRECT in the last 72 hours. ------------------------------------------------------------------------------------------------------------------  Recent Labs  05/16/14 1737  TSH 1.069   ------------------------------------------------------------------------------------------------------------------ No results for input(s): VITAMINB12, FOLATE, FERRITIN, TIBC, IRON, RETICCTPCT in the last 72 hours.  Coagulation profile No results for input(s): INR, PROTIME in the last 168 hours.  No results for input(s): DDIMER in the last 72 hours.  Cardiac Enzymes No results for input(s): CKMB, TROPONINI, MYOGLOBIN in the last 168 hours.  Invalid input(s): CK ------------------------------------------------------------------------------------------------------------------ Invalid input(s): POCBNP     Time Spent in minutes   35 minutes   Donnielle Addison M.D on 05/19/2014 at 3:12 PM  Between 7am to 7pm - Pager - [phone]  After 7pm go to [email or web address] - password [email or web address]  And look for the night coverage person covering for me after hours  [facility] Office  [phone]   **Disclaimer: This note may have been dictated with voice recognition software. Similar sounding words can inadvertently be transcribed and this note may contain transcription errors which may not have been corrected upon publication of note.**

## 2014-05-19 NOTE — Progress Notes (Signed)
Pt had two more loose stools today (witnessed). Last one appeared red blood-tinged.

## 2014-05-19 NOTE — Plan of Care (Signed)
Problem: Phase II Progression Outcomes Goal: Discharge plan established Outcome: Completed/Met Date Met:  05/19/14 SNF for rehab

## 2014-05-20 LAB — CBC
HCT: 25.7 % — ABNORMAL LOW (ref 36.0–46.0)
Hemoglobin: 8.1 g/dL — ABNORMAL LOW (ref 12.0–15.0)
MCH: 28.8 pg (ref 26.0–34.0)
MCHC: 31.5 g/dL (ref 30.0–36.0)
MCV: 91.5 fL (ref 78.0–100.0)
Platelets: 373 10*3/uL (ref 150–400)
RBC: 2.81 MIL/uL — ABNORMAL LOW (ref 3.87–5.11)
RDW: 14.3 % (ref 11.5–15.5)
WBC: 8.1 10*3/uL (ref 4.0–10.5)

## 2014-05-20 MED ORDER — SACCHAROMYCES BOULARDII 250 MG PO CAPS: 250.0000 mg | ORAL_CAPSULE | Freq: Two times a day (BID) | ORAL | Status: DC

## 2014-05-20 MED ORDER — PANTOPRAZOLE SODIUM 40 MG PO TBEC: 40.0000 mg | DELAYED_RELEASE_TABLET | Freq: Two times a day (BID) | ORAL | Status: DC

## 2014-05-20 MED ORDER — SUCRALFATE 1 GM/10ML PO SUSP: 1.0000 g | Freq: Three times a day (TID) | ORAL | Status: DC

## 2014-05-20 MED ORDER — PROMETHAZINE HCL 12.5 MG PO TABS: 12.5000 mg | ORAL_TABLET | Freq: Three times a day (TID) | ORAL | Status: DC | PRN

## 2014-05-20 MED ORDER — SODIUM CHLORIDE 0.9 % IV BOLUS (SEPSIS)
250.0000 mL | Freq: Once | INTRAVENOUS | Status: AC
  Administered 2014-05-20: 250 mL via INTRAVENOUS

## 2014-05-20 NOTE — Progress Notes (Signed)
Pt had BP 86/55 mm of hg. Pt is asymptomatic. On-call provider Merdis DelayK Schorr, NP notified. 250 cc  of NS bolus given as ordered. Will cont to monitor .

## 2014-05-20 NOTE — Progress Notes (Signed)
Patient ID: Kimberly Weiss, female   DOB: 10/06/1980, 34 y.o.   MRN: [id number]    Subjective: Pt nervous about going home.  Eating well.  Still has abdominal pain  Objective: Vital signs in last 24 hours: Temp:  [98.8 F (37.1 C)-99.7 F (37.6 C)] 98.8 F (37.1 C) (03/21 0544) Pulse Rate:  [62-83] 62 (03/21 0544) Resp:  [16-18] 18 (03/21 0544) BP: (86-101)/(50-60) 90/60 mmHg (03/21 0753) SpO2:  [97 %-100 %] 99 % (03/21 0544) Weight:  [68.901 kg (151 lb 14.4 oz)] 68.901 kg (151 lb 14.4 oz) (03/21 0627) Last BM Date: 05/19/14  Intake/Output from previous day: 03/20 0701 - 03/21 0700 In: 1750 [P.O.:850; I.V.:900] Out: 850 [Urine:850] Intake/Output this shift: Total I/O In: 240 [P.O.:240] Out: -   PE: Abd: soft, minimal pain, +BS, ND, incision healing well  Lab Results:   Recent Labs  05/19/14 0610 05/20/14 0618  WBC 6.7 8.1  HGB 7.9* 8.1*  HCT 24.8* 25.7*  PLT 393 373   BMET  Recent Labs  05/18/14 0500 05/19/14 0610  NA 137 140  K 3.8 3.5  CL 109 111  CO2 21 24  GLUCOSE 81 89  BUN <5* <5*  CREATININE 0.68 0.65  CALCIUM 8.2* 7.9*   PT/INR No results for input(s): LABPROT, INR in the last 72 hours. CMP     Component Value Date/Time   NA 140 05/19/2014 0610   K 3.5 05/19/2014 0610   CL 111 05/19/2014 0610   CO2 24 05/19/2014 0610   GLUCOSE 89 05/19/2014 0610   BUN <5* 05/19/2014 0610   CREATININE 0.65 05/19/2014 0610   CALCIUM 7.9* 05/19/2014 0610   PROT 4.6* 05/04/2014 0518   ALBUMIN 2.3* 05/04/2014 0518   AST 13 05/04/2014 0518   ALT 11 05/04/2014 0518   ALKPHOS 59 05/04/2014 0518   BILITOT 0.5 05/04/2014 0518   GFRNONAA >90 05/19/2014 0610   GFRAA >90 05/19/2014 0610   Lipase     Component Value Date/Time   LIPASE 28 04/26/2014 2314       Studies/Results: No results found.  Anti-infectives: Anti-infectives    Start     Dose/Rate Route Frequency Ordered Stop   05/17/14 1030  cefTRIAXone (ROCEPHIN) 1 g in dextrose 5 % 50 mL IVPB  - Premix  Status:  Discontinued     1 g 100 mL/hr over 30 Minutes Intravenous Every 24 hours 05/17/14 1016 05/17/14 1442   05/17/14 0000  metroNIDAZOLE (FLAGYL) IVPB 500 mg  Status:  Discontinued     500 mg 100 mL/hr over 60 Minutes Intravenous Every 8 hours 05/16/14 1906 05/17/14 1441   05/16/14 1830  metroNIDAZOLE (FLAGYL) IVPB 500 mg  Status:  Discontinued     500 mg 100 mL/hr over 60 Minutes Intravenous Every 8 hours 05/16/14 1829 05/16/14 1905   05/16/14 1645  metroNIDAZOLE (FLAGYL) IVPB 500 mg     500 mg 100 mL/hr over 60 Minutes Intravenous  Once 05/16/14 1635 05/16/14 1752   05/16/14 1600  metroNIDAZOLE (FLAGYL) tablet 500 mg  Status:  Discontinued     500 mg Oral 3 times per day 05/16/14 1553 05/16/14 1829       Assessment/Plan  POD 13, s/p ex lap with oversew of bleeding duodenal ulcer -cont PPI and carafate -on regular diet. Patient is surgically stable when felt medically stable for dc home. She should have follow up with Dr. Janee Mornhompson already scheduled since last admission. No other recommendations at this time. We will sign off.  LOS: 3 days    [name] 05/20/2014, 10:03 AM Pager: [phone]

## 2014-05-20 NOTE — Progress Notes (Signed)
CARE MANAGEMENT NOTE 05/20/2014  Patient:  Toy CookeyHAYES,Kendyl N   Account Number:  [id number]  Date Initiated:  05/20/2014  Documentation initiated by:  [facility]Orelia Brandstetter  Subjective/Objective Assessment:   gi bleed     Action/Plan:   home with family   Anticipated DC Date:  05/20/2014   Anticipated DC Plan:  HOME W HOME HEALTH SERVICES      DC Planning Services  CM consult      Choice offered to / List presented to:             Status of service:  Completed, signed off Medicare Important Message given?  NO (If response is "NO", the following Medicare IM given date fields will be blank) Date Medicare IM given:   Medicare IM given by:   Date Additional Medicare IM given:   Additional Medicare IM given by:    Discharge Disposition:  HOME W HOME HEALTH SERVICES  Per UR Regulation:    If discussed at Long Length of Stay Meetings, dates discussed:    Comments:  05/20/2014 1330 [facility] Pt requesting [facility] for home. Notified [facility] for DME for home. [facility]. Isidoro DonningAlesia Salih Williamson RN CCM Case Mgmt phone [phone]

## 2014-05-20 NOTE — Progress Notes (Signed)
Pt. Received discharge instructions and prescriptions. Educated pt. On follow-up appointments. Removed IV. No skin issues noted. Return home meds to patient. Home Health [facility] been set [facility] and [facility] bedside. All questions answered. No further needs noted at this time.

## 2014-05-20 NOTE — Progress Notes (Signed)
Pt states she's "scared" b/c she's not sure she'll be well enough to care for her son at home.  C/o diarr, but only 1 bm so far this morning (she's not aware of any bld w/ it).  Hgb stable this a.m.  Ate 85% of breakfast.  Also concerned about low BP and nausea.  Recommendations:  Please see yesterday's and Saturday's notes.  Pt should probably continue current antipeptic therapy as outpt for at least the next month.  I told her that she could use Imodium in low doses if needed to control diarr.  If there are concerns about suitability for dischg, it might help to have a PT eval to ascertain pt's stability for ambulation.  If there are concerns about the diarr and rectal bleeding, could have pt save all stools in a hat for nurse inspection.  I have made pt an appt for f/u w/ Dr. Bosie ClosSchooler for April 12 at 2pm.  I will continue to follow pt in-house if you elect not to dischg her today.  Florencia Reasonsobert V. Aryaa Bunting, M.D. [phone]

## 2014-05-20 NOTE — Progress Notes (Signed)
PT Cancellation Note  Patient Details Name: Toy CookeyKatherine N Rackley MRN: [id number] DOB: 06/07/1980   Cancelled Treatment:    Reason Eval/Treat Not Completed: Actively being discharged   Marquin Patino 05/20/2014, 5:05 PM  Vella RaringKailee Minh Jasper, SPT (student physical therapist) Office phone: [phone]

## 2014-05-20 NOTE — Discharge Instructions (Signed)
Follow with Primary MD Lilyan PuntLUKING,SCOTT, MD in 7 days   Get CBC, CMP, 2 view Chest X ray checked  by Primary MD next visit.    Activity: As tolerated with Full fall precautions use walker/cane & assistance as needed   Disposition Home    Diet: Heart Healthy  , with feeding assistance and aspiration precautions as needed.  For Heart failure patients - Check your Weight same time everyday, if you gain over 2 pounds, or you develop in leg swelling, experience more shortness of breath or chest pain, call your Primary MD immediately. Follow Cardiac Low Salt Diet and 1.5 lit/day fluid restriction.   On your next visit with your primary care physician please Get Medicines reviewed and adjusted.   Please request your Prim.MD to go over all Hospital Tests and Procedure/Radiological results at the follow up, please get all Hospital records sent to your Prim MD by signing hospital release before you go home.   If you experience worsening of your admission symptoms, develop shortness of breath, life threatening emergency, suicidal or homicidal thoughts you must seek medical attention immediately by calling 911 or calling your MD immediately  if symptoms less severe.  You Must read complete instructions/literature along with all the possible adverse reactions/side effects for all the Medicines you take and that have been prescribed to you. Take any new Medicines after you have completely understood and accpet all the possible adverse reactions/side effects.   Do not drive, operating heavy machinery, perform activities at heights, swimming or participation in water activities or provide baby sitting services if your were admitted for syncope or siezures until you have seen by Primary MD or a Neurologist and advised to do so again.  Do not drive when taking Pain medications.    Do not take more than prescribed Pain, Sleep and Anxiety Medications  Special Instructions: If you have smoked or chewed  Tobacco  in the last 2 yrs please stop smoking, stop any regular Alcohol  and or any Recreational drug use.  Wear Seat belts while driving.   Please note  You were cared for by a hospitalist during your hospital stay. If you have any questions about your discharge medications or the care you received while you were in the hospital after you are discharged, you can call the unit and asked to speak with the hospitalist on call if the hospitalist that took care of you is not available. Once you are discharged, your primary care physician will handle any further medical issues. Please note that NO REFILLS for any discharge medications will be authorized once you are discharged, as it is imperative that you return to your primary care physician (or establish a relationship with a primary care physician if you do not have one) for your aftercare needs so that they can reassess your need for medications and monitor your lab values.

## 2014-05-20 NOTE — Clinical Social Work Note (Signed)
Per patient's request, [facility] referral faxed.   Roddie McBryant Ritta Hammes MSW, [place], [place], [phone]

## 2014-05-20 NOTE — Discharge Summary (Signed)
Kimberly QUIZHPI, [place] y.o., DOB 02-01-1981, MRN [id number]. Admission date: 05/16/2014 Discharge Date 05/20/2014 Primary MD Sallee Lange, MD Admitting Physician Thurnell Lose, MD   PCP please follow-up on: - Check CBC, BMP during next visit.  Admission Diagnosis  Diarrhea [R19.7]  Discharge Diagnosis   Principal Problem:   Diarrhea Active Problems:   Hypovolemic shock   Tobacco abuse   Duodenal ulcer with hemorrhage   Chronic narcotic use    Past Medical History  Diagnosis Date  . Renal disorder   . Interstitial cystitis   . Thyroid disease   . Hypothyroidism   . Renal stones   . Back pain   . GI bleed     Past Surgical History  Procedure Laterality Date  . Esophagogastroduodenoscopy N/A 04/27/2014    Procedure: ESOPHAGOGASTRODUODENOSCOPY (EGD);  Surgeon: Lear Ng, MD;  Location: [facility] ENDOSCOPY;  Service: Endoscopy;  Laterality: N/A;  . Esophagogastroduodenoscopy (egd) with propofol N/A 05/03/2014    Procedure: ESOPHAGOGASTRODUODENOSCOPY (EGD) WITH PROPOFOL;  Surgeon: Lear Ng, MD;  Location: [place];  Service: Endoscopy;  Laterality: N/A;  . Laparotomy N/A 05/07/2014    Procedure: EXPLORATORY LAPAROTOMY;  Surgeon: Georganna Skeans, MD;  Location: [place];  Service: General;  Laterality: N/A;  . Repair of perforated ulcer N/A 05/07/2014    Procedure: Graylon Gunning DUODENAL ULCER;  Surgeon: Georganna Skeans, MD;  Location: [place];  Service: General;  Laterality: N/A;     Hospital Course See H&P, Labs, Consult and Test reports for all details in brief, patient was admitted for **  Principal Problem:   Diarrhea Active Problems:   Hypovolemic shock   Tobacco abuse   Duodenal ulcer with hemorrhage   Chronic narcotic use   Kimberly Weiss is a 34 y.o. female, with history of chronic back pain and narcotic dependence, hypothyroidism, recent admission and discharge for Severe upper GI bleed with blood loss treated anemia secondary to duodenal ulcer. Likely due to  NSAID use. Status post exploratory laparotomy with oversew of duodenal ulcer by general surgery on 05/07/2014, was discharged home on Protonix and Carafate comes to the ER with chief complaints of diarrhea, nausea vomiting and generalized weakness starting yesterday.   In the ER workup consistent with severe dehydration with elevated lactic acid level, hypotension, patient started on aggressive IV fluid resuscitation.  Abdominal pain/history of duodenal ulcer - Status post exploratory laparotomy with oversew of bleeding ulcer 05/07/14. - CT abdomen only showing postoperative changes. - Hemoglobin at baseline, Hemoccult positive, but most likely related to her recent GI bleed. - Continue with Protonix twice a day, and Carafate. - Tolerating soft diet for the last 48 hours - Gen. surgery and gastrocolic consult appreciated, follow-up as an outpatient.  Diarrhea - C. difficile negative, possible gastroenteritis. - No further diarrhea  Hypotension - Patient colic blood pressure usually in the 90s ( viewed previous admission as well), DC hydrochlorothiazide.  Chronic back pain - Chronic narcotic dependence  hypothyroidism - continue with Synthroid  Tobacco abuse - continue with nicotine patch  Consults   Gastroenterology Gen. surgery  Significant Tests:  See full reports for all details    Dg Chest 2 View  05/09/2014   CLINICAL DATA:  Cough and postnasal drip for a few days.  Weakness.  EXAM: CHEST  2 VIEW  COMPARISON:  None.  FINDINGS: A right PICC is present and terminates near the cavoatrial junction. An enteric tube loops in the upper abdomen over the proximal stomach with side hole well beyond the GE  junction and tip not imaged. There is slight elevation of the right hemidiaphragm. Cardiomediastinal silhouette is within normal limits. The lungs are slightly hypoinflated without evidence of airspace consolidation, edema, pleural effusion, or pneumothorax. Upper abdominal skin staples  and a likely surgical drain are noted. No acute osseous abnormality is identified.  IMPRESSION: No active cardiopulmonary disease.   Electronically Signed   By: Logan Bores   On: 05/09/2014 14:59   Ct Head Wo Contrast  04/29/2014   CLINICAL DATA:  Fall. Hit left-sided head on dry air. Headaches. Neck pain.  EXAM: CT HEAD WITHOUT CONTRAST  CT CERVICAL SPINE WITHOUT CONTRAST  TECHNIQUE: Multidetector CT imaging of the head and cervical spine was performed following the standard protocol without intravenous contrast. Multiplanar CT image reconstructions of the cervical spine were also generated.  COMPARISON:  None.  FINDINGS: CT HEAD FINDINGS  No acute cortical infarct, hemorrhage, or mass lesion ispresent. Ventricles are of normal size. No significant extra-axial fluid collection is present. The paranasal sinuses andmastoid air cells are clear. The osseous skull is intact.  CT CERVICAL SPINE FINDINGS  There is reversal of normal cervical lordosis which may reflect muscle spasm or patient positioning. The vertebral body heights are well preserved. The facet joints are all well aligned. The prevertebral soft tissue space is normal.  IMPRESSION: 1. No acute intracranial abnormalities. 2. Reversal of normal cervical lordosis which may reflect muscle spasm or patient positioning.   Electronically Signed   By: Kerby Moors M.D.   On: 04/29/2014 16:00   Ct Cervical Spine Wo Contrast  04/29/2014   CLINICAL DATA:  Fall. Hit left-sided head on dry air. Headaches. Neck pain.  EXAM: CT HEAD WITHOUT CONTRAST  CT CERVICAL SPINE WITHOUT CONTRAST  TECHNIQUE: Multidetector CT imaging of the head and cervical spine was performed following the standard protocol without intravenous contrast. Multiplanar CT image reconstructions of the cervical spine were also generated.  COMPARISON:  None.  FINDINGS: CT HEAD FINDINGS  No acute cortical infarct, hemorrhage, or mass lesion ispresent. Ventricles are of normal size. No significant  extra-axial fluid collection is present. The paranasal sinuses andmastoid air cells are clear. The osseous skull is intact.  CT CERVICAL SPINE FINDINGS  There is reversal of normal cervical lordosis which may reflect muscle spasm or patient positioning. The vertebral body heights are well preserved. The facet joints are all well aligned. The prevertebral soft tissue space is normal.  IMPRESSION: 1. No acute intracranial abnormalities. 2. Reversal of normal cervical lordosis which may reflect muscle spasm or patient positioning.   Electronically Signed   By: Kerby Moors M.D.   On: 04/29/2014 16:00   Ct Abdomen Pelvis W Contrast  05/17/2014   CLINICAL DATA:  Abdominal pain, diarrhea, nausea/ vomiting. Status post exploratory laparotomy for duodenal ulcer repair on 05/07/2014.  EXAM: CT ABDOMEN AND PELVIS WITH CONTRAST  TECHNIQUE: Multidetector CT imaging of the abdomen and pelvis was performed using the standard protocol following bolus administration of intravenous contrast.  CONTRAST:  166m OMNIPAQUE IOHEXOL 300 MG/ML  SOLN  COMPARISON:  MRI abdomen dated 02/04/2014.  FINDINGS: Lower chest:  Lung bases are clear.  Hepatobiliary: Liver is notable for focal fat/altered perfusion along the falciform ligament. No suspicious/ enhancing lesions.  Gallbladder wall thickening/ edema. Associated localized gas along the superior wall/gallbladder fossa (series 2/images 19-20), in a linear configuration, related to prior drain tract.  Pancreas: Within normal limits.  Spleen: Within normal limits.  Adrenals/Urinary Tract: Adrenal glands are within normal limits.  Right kidney is within normal limits. Heterogeneous perfusion of the left kidney (series 2/image 28). No hydronephrosis.  Bladder is mildly thick-walled although underdistended.  Stomach/Bowel: Wall thickening/inflammatory changes along the anterior aspect of the gastric antrum/duodenal bulb (series 2/image 23), related to known gastroduodenal ulcer with recent  repair.  Linear tract with localized gas along the anterior wall of the stomach/duodenum bulb (series 5/image 31).  No evidence of bowel obstruction.  Normal appendix.  Vascular/Lymphatic: No evidence of abdominal aortic aneurysm.  No suspicious abdominopelvic lymphadenopathy.  Reproductive: Uterus and bilateral ovaries are within normal limits.  Other: No abdominopelvic ascites.  Tiny foci of free air under the diaphragm (series 2/ image 15), likely postoperative.  Musculoskeletal: Degenerative changes at L5-S1.  IMPRESSION: Wall thickening/inflammatory changes along the anterior aspect of the gastric antrum/ duodenal bulb, related to known gastroduodenal ulcer with recent repair.  Associated localized gas along the anterior surgical bed and in the gallbladder fossa, in a linear configuration, related to recent drain tract.  Secondary gallbladder wall thickening/inflammatory changes.  Small volume free air under the diaphragm, postoperative.   Electronically Signed   By: Julian Hy M.D.   On: 05/17/2014 13:25   Ir Angiogram Visceral Selective  05/05/2014   INDICATION: 34 year old female admitted to the hospital with upper GI bleeding and hematemesis.  She was admitted April 26, 2013. Upper endoscopy demonstrates duodenum ulcer.  She has been stable over 1 week, though developed syncopal episode and acute anemia with a hemoglobin of 5.7 on today's date. She has been referred for evaluation and treatment of the duodenum ulcer hemorrhage.  EXAM: 1. AORTIC ANGIOGRAM 2. SUPERIOR MESENTERIC ARTERIOGRAM 3. SUB SELECTION OF FOUR TERTIARY BRANCHES FROM THE SUPERIOR MESENTERIC ARTERY 4. ULTRASOUND GUIDED VASCULAR ACCESS OF THE RIGHT COMMON FEMORAL ARTERY 5. MANUAL PRESSURE FOR HEMOSTASIS.  COMPARISON:  CT abdomen pelvis, 04/27/2013, 09/22/2001  MEDICATIONS: Versed 2.0 mg IV; Fentanyl 100 mcg IV  CONTRAST:  132m OMNIPAQUE IOHEXOL 300 MG/ML  SOLN  ANESTHESIA/SEDATION: One hundred fifty minutes  FLUOROSCOPY TIME:   Forty-one minutes.  Fifty-four seconds.  2941 mGy  ACCESS: Right common femoral artery; hemostasis achieved with manual compression.  COMPLICATIONS: None immediate  TECHNIQUE: Informed written consent was obtained from the patient after a discussion of the risks, benefits and alternatives to treatment. Questions regarding the procedure were encouraged and answered. A timeout was performed prior to the initiation of the procedure.  The right groin was prepped and draped in the usual sterile fashion, and a sterile drape was applied covering the operative field. Maximum barrier sterile technique with sterile gowns and gloves were used for the procedure. A timeout was performed prior to the initiation of the procedure. Local anesthesia was provided with 1% lidocaine.  Physical exam was performed to identify the location of the right common femoral artery. Ultrasound survey was performed with images stored sent to PACs.  Once the common femoral artery was identified under ultrasound, ultrasound guidance was used to generously infiltrate the skin and subcutaneous tissues with 1% lidocaine without epinephrine. A micropuncture access was then used to puncture the right common femoral artery. With excellent blood flow returned, and micro wire was advanced through the needle observed under fluoroscopy to enter the iliac system. The needle was removed from wire a micropuncture kit was advanced over the wire. The inner dilator and cannula were removed with a wire, and an 035 Bentson wire was advanced into the abdominal aorta under fluoroscopy the micro puncture was removed, and a 5 FPakistansheath  was advanced over the Bentson wire into the common femoral artery. The dilator was removed and the sheath was flushed.  A pigtail Flush catheter was advanced over the Bentson wire into the lower thoracic aorta. Aortic flush angiogram performed.  Pigtail catheter was then exchanged over wire for C2 Cobra catheter which was used to select  the superior mesenteric artery. Superior mesenteric artery angiogram was performed.  Subsequently, a combination of the micro catheter a micro wire were used to select various branches of superior mesenteric artery in this patient with a celiacomesenteric trunk. A standard renegade micro catheter as well as a soft synchro an a standard .016 synchro wire were used to select branches of the superior mesenteric artery. Sub selection included:  Replaced common hepatic artery.  Right hepatic artery.  New middle colicky artery.  Proximal jejunal branches.  Two base catheter exchanges were made on the Bentson wire including exchange for Mickelson catheter and exchanged for a C2 Cobra catheter for formation of a Waltman loop.  After dedicated angiogram of each of the sub selected arteries, all catheters and wires were removed.  Angiogram through the sheath was performed with the common femoral artery.  Deployment of an Exoseal device for hemostasis failed without deployment. Manual pressure was used for hemostasis of the right common femoral artery.  Patient tolerated procedure well and remained hemodynamically stable throughout. At the end of the procedure, the patient's vital signs were within normal range. Systolic brought pressure 110. Heart rate of 80-90. No syncopal events, no altered mental status, no hematemesis, and no hematochezia/melena.  Marland Kitchen  FINDINGS: Aortic angiogram:  Normal course caliber and contour of the abdominal aorta. Multiple segmental vessels fill within the lower thoracic spine and lumbar spine. No aneurysm dissection flap identified. No significant atherosclerotic changes.  Steep angled take-off of the bilateral common iliac arteries.  Single bilateral renal arteries.  No celiac artery origin.  Superior mesenteric artery patent, with configuration of a celiacomesenteric trunk.  Late phase of the aortic rind demonstrates no evidence of bleeding.  Celiacomesenteric angiogram:  Normal caliber and  contour of the origin of the trunk. Multiple jejunal branches present. Normal caliber and contour of the ileocolic artery. Multiple tortuous vessels overlie the proximal 3rd of the trunk, with evidence of a replaced common hepatic artery contributing to both right and left hepatic arteries. There is a replaced vessel representing a celiac vessel, with cross-filling at a bifurcation to the splenic artery and the hepatic arteries. Patent left gastric artery. New  Late phase of the trunk injection demonstrates no evidence of bleeding. No tumor blush or pooling of contrast. Patent portal venous system identified.  Common hepatic artery injection:  Replaced common hepatic artery with right hepatic arteries and left hepatic arteries from the replaced trunk. No evidence of tumor blush, aneurysm, dissection flap, pulling of contrast. No abnormal enhancement identified. No pseudoaneurysm identified.  No call it artery injection:  Patent middle call it artery of normal caliber. Significant cross flow from collateral vessels with no pooling of contrast, pseudoaneurysm, or tumor blush.  Proximal jejunal arcade:  Normal course caliber and contour. Normal blush of the proximal jejunal mucosa. No filling defects, or pseudoaneurysm.  No embolization was performed.  IMPRESSION: Status post diagnostic mesenteric angiogram.  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  No bleeding was identified from the replaced common hepatic artery, which would be the best candidate for supplying the pancreaticoduodenal arteries near the known proximal duodenum ulcer.  Because no bleeding was identified of this variant anatomy, no empiric embolization was performed.  Signed,  Dulcy Fanny. Earleen Newport, DO  [facility]  [facility]  PLAN: The patient will require 6 hours flattened bed with log roll only for hemostasis of the right common femoral  artery after manual compression.  Agree with ICU management, including serial hemoglobin and hematocrit checks.   Electronically Signed   By: Corrie Mckusick D.O.   On: 05/05/2014 16:37   Ir Angiogram Selective Each Additional Vessel  05/05/2014   INDICATION: 34 year old female admitted to the hospital with upper GI bleeding and hematemesis.  She was admitted April 26, 2013. Upper endoscopy demonstrates duodenum ulcer.  She has been stable over 1 week, though developed syncopal episode and acute anemia with a hemoglobin of 5.7 on today's date. She has been referred for evaluation and treatment of the duodenum ulcer hemorrhage.  EXAM: 1. AORTIC ANGIOGRAM 2. SUPERIOR MESENTERIC ARTERIOGRAM 3. SUB SELECTION OF FOUR TERTIARY BRANCHES FROM THE SUPERIOR MESENTERIC ARTERY 4. ULTRASOUND GUIDED VASCULAR ACCESS OF THE RIGHT COMMON FEMORAL ARTERY 5. MANUAL PRESSURE FOR HEMOSTASIS.  COMPARISON:  CT abdomen pelvis, 04/27/2013, 09/22/2001  MEDICATIONS: Versed 2.0 mg IV; Fentanyl 100 mcg IV  CONTRAST:  154m OMNIPAQUE IOHEXOL 300 MG/ML  SOLN  ANESTHESIA/SEDATION: One hundred fifty minutes  FLUOROSCOPY TIME:  Forty-one minutes.  Fifty-four seconds.  2941 mGy  ACCESS: Right common femoral artery; hemostasis achieved with manual compression.  COMPLICATIONS: None immediate  TECHNIQUE: Informed written consent was obtained from the patient after a discussion of the risks, benefits and alternatives to treatment. Questions regarding the procedure were encouraged and answered. A timeout was performed prior to the initiation of the procedure.  The right groin was prepped and draped in the usual sterile fashion, and a sterile drape was applied covering the operative field. Maximum barrier sterile technique with sterile gowns and gloves were used for the procedure. A timeout was performed prior to the initiation of the procedure. Local anesthesia was provided with 1% lidocaine.  Physical exam was performed to identify the location of the  right common femoral artery. Ultrasound survey was performed with images stored sent to PACs.  Once the common femoral artery was identified under ultrasound, ultrasound guidance was used to generously infiltrate the skin and subcutaneous tissues with 1% lidocaine without epinephrine. A micropuncture access was then used to puncture the right common femoral artery. With excellent blood flow returned, and micro wire was advanced through the needle observed under fluoroscopy to enter the iliac system. The needle was removed from wire a micropuncture kit was advanced over the wire. The inner dilator and cannula were removed with a wire, and an 035 Bentson wire was advanced into the abdominal aorta under fluoroscopy the micro puncture was removed, and a 5 French sheath was advanced over the Bentson wire into the common femoral artery. The dilator was removed and the sheath was flushed.  A pigtail Flush catheter was advanced over the Bentson wire into the lower thoracic aorta. Aortic flush angiogram performed.  Pigtail catheter was then exchanged over wire for C2 Cobra catheter which was used to select the superior mesenteric artery. Superior mesenteric artery angiogram was performed.  Subsequently, a combination of the micro catheter a micro wire were used to select various branches of superior mesenteric artery in this patient with a celiacomesenteric trunk. A standard renegade micro catheter as well as a soft synchro an a standard .016 synchro wire were used to select branches  of the superior mesenteric artery. Sub selection included:  Replaced common hepatic artery.  Right hepatic artery.  New middle colicky artery.  Proximal jejunal branches.  Two base catheter exchanges were made on the Bentson wire including exchange for Mickelson catheter and exchanged for a C2 Cobra catheter for formation of a Waltman loop.  After dedicated angiogram of each of the sub selected arteries, all catheters and wires were removed.   Angiogram through the sheath was performed with the common femoral artery.  Deployment of an Exoseal device for hemostasis failed without deployment. Manual pressure was used for hemostasis of the right common femoral artery.  Patient tolerated procedure well and remained hemodynamically stable throughout. At the end of the procedure, the patient's vital signs were within normal range. Systolic brought pressure 110. Heart rate of 80-90. No syncopal events, no altered mental status, no hematemesis, and no hematochezia/melena.  Marland Kitchen  FINDINGS: Aortic angiogram:  Normal course caliber and contour of the abdominal aorta. Multiple segmental vessels fill within the lower thoracic spine and lumbar spine. No aneurysm dissection flap identified. No significant atherosclerotic changes.  Steep angled take-off of the bilateral common iliac arteries.  Single bilateral renal arteries.  No celiac artery origin.  Superior mesenteric artery patent, with configuration of a celiacomesenteric trunk.  Late phase of the aortic rind demonstrates no evidence of bleeding.  Celiacomesenteric angiogram:  Normal caliber and contour of the origin of the trunk. Multiple jejunal branches present. Normal caliber and contour of the ileocolic artery. Multiple tortuous vessels overlie the proximal 3rd of the trunk, with evidence of a replaced common hepatic artery contributing to both right and left hepatic arteries. There is a replaced vessel representing a celiac vessel, with cross-filling at a bifurcation to the splenic artery and the hepatic arteries. Patent left gastric artery. New  Late phase of the trunk injection demonstrates no evidence of bleeding. No tumor blush or pooling of contrast. Patent portal venous system identified.  Common hepatic artery injection:  Replaced common hepatic artery with right hepatic arteries and left hepatic arteries from the replaced trunk. No evidence of tumor blush, aneurysm, dissection flap, pulling of contrast.  No abnormal enhancement identified. No pseudoaneurysm identified.  No call it artery injection:  Patent middle call it artery of normal caliber. Significant cross flow from collateral vessels with no pooling of contrast, pseudoaneurysm, or tumor blush.  Proximal jejunal arcade:  Normal course caliber and contour. Normal blush of the proximal jejunal mucosa. No filling defects, or pseudoaneurysm.  No embolization was performed.  IMPRESSION: Status post diagnostic mesenteric angiogram.  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  No bleeding was identified from the replaced common hepatic artery, which would be the best candidate for supplying the pancreaticoduodenal arteries near the known proximal duodenum ulcer. Because no bleeding was identified of this variant anatomy, no empiric embolization was performed.  Signed,  Dulcy Fanny. Earleen Newport, DO  [facility]  [facility]  PLAN: The patient will require 6 hours flattened bed with log roll only for hemostasis of the right common femoral artery after manual compression.  Agree with ICU management, including serial hemoglobin and hematocrit checks.   Electronically Signed   By: Corrie Mckusick D.O.   On: 05/05/2014 16:37   Ir Angiogram Selective Each Additional Vessel  05/05/2014   INDICATION: 34 year old female admitted to the hospital with upper GI bleeding and hematemesis.  She was admitted April 26, 2013. Upper endoscopy demonstrates  duodenum ulcer.  She has been stable over 1 week, though developed syncopal episode and acute anemia with a hemoglobin of 5.7 on today's date. She has been referred for evaluation and treatment of the duodenum ulcer hemorrhage.  EXAM: 1. AORTIC ANGIOGRAM 2. SUPERIOR MESENTERIC ARTERIOGRAM 3. SUB SELECTION OF FOUR TERTIARY BRANCHES FROM THE SUPERIOR MESENTERIC ARTERY 4. ULTRASOUND GUIDED VASCULAR ACCESS OF THE RIGHT COMMON  FEMORAL ARTERY 5. MANUAL PRESSURE FOR HEMOSTASIS.  COMPARISON:  CT abdomen pelvis, 04/27/2013, 09/22/2001  MEDICATIONS: Versed 2.0 mg IV; Fentanyl 100 mcg IV  CONTRAST:  122m OMNIPAQUE IOHEXOL 300 MG/ML  SOLN  ANESTHESIA/SEDATION: One hundred fifty minutes  FLUOROSCOPY TIME:  Forty-one minutes.  Fifty-four seconds.  2941 mGy  ACCESS: Right common femoral artery; hemostasis achieved with manual compression.  COMPLICATIONS: None immediate  TECHNIQUE: Informed written consent was obtained from the patient after a discussion of the risks, benefits and alternatives to treatment. Questions regarding the procedure were encouraged and answered. A timeout was performed prior to the initiation of the procedure.  The right groin was prepped and draped in the usual sterile fashion, and a sterile drape was applied covering the operative field. Maximum barrier sterile technique with sterile gowns and gloves were used for the procedure. A timeout was performed prior to the initiation of the procedure. Local anesthesia was provided with 1% lidocaine.  Physical exam was performed to identify the location of the right common femoral artery. Ultrasound survey was performed with images stored sent to PACs.  Once the common femoral artery was identified under ultrasound, ultrasound guidance was used to generously infiltrate the skin and subcutaneous tissues with 1% lidocaine without epinephrine. A micropuncture access was then used to puncture the right common femoral artery. With excellent blood flow returned, and micro wire was advanced through the needle observed under fluoroscopy to enter the iliac system. The needle was removed from wire a micropuncture kit was advanced over the wire. The inner dilator and cannula were removed with a wire, and an 035 Bentson wire was advanced into the abdominal aorta under fluoroscopy the micro puncture was removed, and a 5 French sheath was advanced over the Bentson wire into the common femoral  artery. The dilator was removed and the sheath was flushed.  A pigtail Flush catheter was advanced over the Bentson wire into the lower thoracic aorta. Aortic flush angiogram performed.  Pigtail catheter was then exchanged over wire for C2 Cobra catheter which was used to select the superior mesenteric artery. Superior mesenteric artery angiogram was performed.  Subsequently, a combination of the micro catheter a micro wire were used to select various branches of superior mesenteric artery in this patient with a celiacomesenteric trunk. A standard renegade micro catheter as well as a soft synchro an a standard .016 synchro wire were used to select branches of the superior mesenteric artery. Sub selection included:  Replaced common hepatic artery.  Right hepatic artery.  New middle colicky artery.  Proximal jejunal branches.  Two base catheter exchanges were made on the Bentson wire including exchange for Mickelson catheter and exchanged for a C2 Cobra catheter for formation of a Waltman loop.  After dedicated angiogram of each of the sub selected arteries, all catheters and wires were removed.  Angiogram through the sheath was performed with the common femoral artery.  Deployment of an Exoseal device for hemostasis failed without deployment. Manual pressure was used for hemostasis of the right common femoral artery.  Patient tolerated procedure well and remained hemodynamically stable throughout.  At the end of the procedure, the patient's vital signs were within normal range. Systolic brought pressure 110. Heart rate of 80-90. No syncopal events, no altered mental status, no hematemesis, and no hematochezia/melena.  Marland Kitchen  FINDINGS: Aortic angiogram:  Normal course caliber and contour of the abdominal aorta. Multiple segmental vessels fill within the lower thoracic spine and lumbar spine. No aneurysm dissection flap identified. No significant atherosclerotic changes.  Steep angled take-off of the bilateral common iliac  arteries.  Single bilateral renal arteries.  No celiac artery origin.  Superior mesenteric artery patent, with configuration of a celiacomesenteric trunk.  Late phase of the aortic rind demonstrates no evidence of bleeding.  Celiacomesenteric angiogram:  Normal caliber and contour of the origin of the trunk. Multiple jejunal branches present. Normal caliber and contour of the ileocolic artery. Multiple tortuous vessels overlie the proximal 3rd of the trunk, with evidence of a replaced common hepatic artery contributing to both right and left hepatic arteries. There is a replaced vessel representing a celiac vessel, with cross-filling at a bifurcation to the splenic artery and the hepatic arteries. Patent left gastric artery. New  Late phase of the trunk injection demonstrates no evidence of bleeding. No tumor blush or pooling of contrast. Patent portal venous system identified.  Common hepatic artery injection:  Replaced common hepatic artery with right hepatic arteries and left hepatic arteries from the replaced trunk. No evidence of tumor blush, aneurysm, dissection flap, pulling of contrast. No abnormal enhancement identified. No pseudoaneurysm identified.  No call it artery injection:  Patent middle call it artery of normal caliber. Significant cross flow from collateral vessels with no pooling of contrast, pseudoaneurysm, or tumor blush.  Proximal jejunal arcade:  Normal course caliber and contour. Normal blush of the proximal jejunal mucosa. No filling defects, or pseudoaneurysm.  No embolization was performed.  IMPRESSION: Status post diagnostic mesenteric angiogram.  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  No bleeding was identified from the replaced common hepatic artery, which would be the best candidate for supplying the pancreaticoduodenal arteries near the known proximal duodenum ulcer. Because no bleeding was identified of this  variant anatomy, no empiric embolization was performed.  Signed,  Dulcy Fanny. Earleen Newport, DO  [facility]  [facility]  PLAN: The patient will require 6 hours flattened bed with log roll only for hemostasis of the right common femoral artery after manual compression.  Agree with ICU management, including serial hemoglobin and hematocrit checks.   Electronically Signed   By: Corrie Mckusick D.O.   On: 05/05/2014 16:37   Ir Angiogram Selective Each Additional Vessel  05/05/2014   INDICATION: 34 year old female admitted to the hospital with upper GI bleeding and hematemesis.  She was admitted April 26, 2013. Upper endoscopy demonstrates duodenum ulcer.  She has been stable over 1 week, though developed syncopal episode and acute anemia with a hemoglobin of 5.7 on today's date. She has been referred for evaluation and treatment of the duodenum ulcer hemorrhage.  EXAM: 1. AORTIC ANGIOGRAM 2. SUPERIOR MESENTERIC ARTERIOGRAM 3. SUB SELECTION OF FOUR TERTIARY BRANCHES FROM THE SUPERIOR MESENTERIC ARTERY 4. ULTRASOUND GUIDED VASCULAR ACCESS OF THE RIGHT COMMON FEMORAL ARTERY 5. MANUAL PRESSURE FOR HEMOSTASIS.  COMPARISON:  CT abdomen pelvis, 04/27/2013, 09/22/2001  MEDICATIONS: Versed 2.0 mg IV; Fentanyl 100 mcg IV  CONTRAST:  157m OMNIPAQUE IOHEXOL 300 MG/ML  SOLN  ANESTHESIA/SEDATION: One hundred fifty minutes  FLUOROSCOPY TIME:  Forty-one minutes.  FCadiz  seconds.  2941 mGy  ACCESS: Right common femoral artery; hemostasis achieved with manual compression.  COMPLICATIONS: None immediate  TECHNIQUE: Informed written consent was obtained from the patient after a discussion of the risks, benefits and alternatives to treatment. Questions regarding the procedure were encouraged and answered. A timeout was performed prior to the initiation of the procedure.  The right groin was prepped and draped in the usual sterile fashion, and a sterile drape was applied covering the operative  field. Maximum barrier sterile technique with sterile gowns and gloves were used for the procedure. A timeout was performed prior to the initiation of the procedure. Local anesthesia was provided with 1% lidocaine.  Physical exam was performed to identify the location of the right common femoral artery. Ultrasound survey was performed with images stored sent to PACs.  Once the common femoral artery was identified under ultrasound, ultrasound guidance was used to generously infiltrate the skin and subcutaneous tissues with 1% lidocaine without epinephrine. A micropuncture access was then used to puncture the right common femoral artery. With excellent blood flow returned, and micro wire was advanced through the needle observed under fluoroscopy to enter the iliac system. The needle was removed from wire a micropuncture kit was advanced over the wire. The inner dilator and cannula were removed with a wire, and an 035 Bentson wire was advanced into the abdominal aorta under fluoroscopy the micro puncture was removed, and a 5 French sheath was advanced over the Bentson wire into the common femoral artery. The dilator was removed and the sheath was flushed.  A pigtail Flush catheter was advanced over the Bentson wire into the lower thoracic aorta. Aortic flush angiogram performed.  Pigtail catheter was then exchanged over wire for C2 Cobra catheter which was used to select the superior mesenteric artery. Superior mesenteric artery angiogram was performed.  Subsequently, a combination of the micro catheter a micro wire were used to select various branches of superior mesenteric artery in this patient with a celiacomesenteric trunk. A standard renegade micro catheter as well as a soft synchro an a standard .016 synchro wire were used to select branches of the superior mesenteric artery. Sub selection included:  Replaced common hepatic artery.  Right hepatic artery.  New middle colicky artery.  Proximal jejunal branches.   Two base catheter exchanges were made on the Bentson wire including exchange for Mickelson catheter and exchanged for a C2 Cobra catheter for formation of a Waltman loop.  After dedicated angiogram of each of the sub selected arteries, all catheters and wires were removed.  Angiogram through the sheath was performed with the common femoral artery.  Deployment of an Exoseal device for hemostasis failed without deployment. Manual pressure was used for hemostasis of the right common femoral artery.  Patient tolerated procedure well and remained hemodynamically stable throughout. At the end of the procedure, the patient's vital signs were within normal range. Systolic brought pressure 110. Heart rate of 80-90. No syncopal events, no altered mental status, no hematemesis, and no hematochezia/melena.  Marland Kitchen  FINDINGS: Aortic angiogram:  Normal course caliber and contour of the abdominal aorta. Multiple segmental vessels fill within the lower thoracic spine and lumbar spine. No aneurysm dissection flap identified. No significant atherosclerotic changes.  Steep angled take-off of the bilateral common iliac arteries.  Single bilateral renal arteries.  No celiac artery origin.  Superior mesenteric artery patent, with configuration of a celiacomesenteric trunk.  Late phase of the aortic rind demonstrates no evidence of bleeding.  Celiacomesenteric angiogram:  Normal caliber and contour of the origin of the trunk. Multiple jejunal branches present. Normal caliber and contour of the ileocolic artery. Multiple tortuous vessels overlie the proximal 3rd of the trunk, with evidence of a replaced common hepatic artery contributing to both right and left hepatic arteries. There is a replaced vessel representing a celiac vessel, with cross-filling at a bifurcation to the splenic artery and the hepatic arteries. Patent left gastric artery. New  Late phase of the trunk injection demonstrates no evidence of bleeding. No tumor blush or pooling  of contrast. Patent portal venous system identified.  Common hepatic artery injection:  Replaced common hepatic artery with right hepatic arteries and left hepatic arteries from the replaced trunk. No evidence of tumor blush, aneurysm, dissection flap, pulling of contrast. No abnormal enhancement identified. No pseudoaneurysm identified.  No call it artery injection:  Patent middle call it artery of normal caliber. Significant cross flow from collateral vessels with no pooling of contrast, pseudoaneurysm, or tumor blush.  Proximal jejunal arcade:  Normal course caliber and contour. Normal blush of the proximal jejunal mucosa. No filling defects, or pseudoaneurysm.  No embolization was performed.  IMPRESSION: Status post diagnostic mesenteric angiogram.  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  No bleeding was identified from the replaced common hepatic artery, which would be the best candidate for supplying the pancreaticoduodenal arteries near the known proximal duodenum ulcer. Because no bleeding was identified of this variant anatomy, no empiric embolization was performed.  Signed,  Dulcy Fanny. Earleen Newport, DO  [facility]  [facility] Radiology  PLAN: The patient will require 6 hours flattened bed with log roll only for hemostasis of the right common femoral artery after manual compression.  Agree with ICU management, including serial hemoglobin and hematocrit checks.   Electronically Signed   By: Corrie Mckusick D.O.   On: 05/05/2014 16:37   Ir Angiogram Selective Each Additional Vessel  05/05/2014   INDICATION: 34 year old female admitted to the hospital with upper GI bleeding and hematemesis.  She was admitted April 26, 2013. Upper endoscopy demonstrates duodenum ulcer.  She has been stable over 1 week, though developed syncopal episode and acute anemia with a hemoglobin of 5.7 on today's date. She has  been referred for evaluation and treatment of the duodenum ulcer hemorrhage.  EXAM: 1. AORTIC ANGIOGRAM 2. SUPERIOR MESENTERIC ARTERIOGRAM 3. SUB SELECTION OF FOUR TERTIARY BRANCHES FROM THE SUPERIOR MESENTERIC ARTERY 4. ULTRASOUND GUIDED VASCULAR ACCESS OF THE RIGHT COMMON FEMORAL ARTERY 5. MANUAL PRESSURE FOR HEMOSTASIS.  COMPARISON:  CT abdomen pelvis, 04/27/2013, 09/22/2001  MEDICATIONS: Versed 2.0 mg IV; Fentanyl 100 mcg IV  CONTRAST:  151m OMNIPAQUE IOHEXOL 300 MG/ML  SOLN  ANESTHESIA/SEDATION: One hundred fifty minutes  FLUOROSCOPY TIME:  Forty-one minutes.  Fifty-four seconds.  2941 mGy  ACCESS: Right common femoral artery; hemostasis achieved with manual compression.  COMPLICATIONS: None immediate  TECHNIQUE: Informed written consent was obtained from the patient after a discussion of the risks, benefits and alternatives to treatment. Questions regarding the procedure were encouraged and answered. A timeout was performed prior to the initiation of the procedure.  The right groin was prepped and draped in the usual sterile fashion, and a sterile drape was applied covering the operative field. Maximum barrier sterile technique with sterile gowns and gloves were used for the procedure. A timeout was performed prior to the initiation of the procedure. Local anesthesia was provided with 1% lidocaine.  Physical exam was  performed to identify the location of the right common femoral artery. Ultrasound survey was performed with images stored sent to PACs.  Once the common femoral artery was identified under ultrasound, ultrasound guidance was used to generously infiltrate the skin and subcutaneous tissues with 1% lidocaine without epinephrine. A micropuncture access was then used to puncture the right common femoral artery. With excellent blood flow returned, and micro wire was advanced through the needle observed under fluoroscopy to enter the iliac system. The needle was removed from wire a micropuncture kit was  advanced over the wire. The inner dilator and cannula were removed with a wire, and an 035 Bentson wire was advanced into the abdominal aorta under fluoroscopy the micro puncture was removed, and a 5 French sheath was advanced over the Bentson wire into the common femoral artery. The dilator was removed and the sheath was flushed.  A pigtail Flush catheter was advanced over the Bentson wire into the lower thoracic aorta. Aortic flush angiogram performed.  Pigtail catheter was then exchanged over wire for C2 Cobra catheter which was used to select the superior mesenteric artery. Superior mesenteric artery angiogram was performed.  Subsequently, a combination of the micro catheter a micro wire were used to select various branches of superior mesenteric artery in this patient with a celiacomesenteric trunk. A standard renegade micro catheter as well as a soft synchro an a standard .016 synchro wire were used to select branches of the superior mesenteric artery. Sub selection included:  Replaced common hepatic artery.  Right hepatic artery.  New middle colicky artery.  Proximal jejunal branches.  Two base catheter exchanges were made on the Bentson wire including exchange for Mickelson catheter and exchanged for a C2 Cobra catheter for formation of a Waltman loop.  After dedicated angiogram of each of the sub selected arteries, all catheters and wires were removed.  Angiogram through the sheath was performed with the common femoral artery.  Deployment of an Exoseal device for hemostasis failed without deployment. Manual pressure was used for hemostasis of the right common femoral artery.  Patient tolerated procedure well and remained hemodynamically stable throughout. At the end of the procedure, the patient's vital signs were within normal range. Systolic brought pressure 110. Heart rate of 80-90. No syncopal events, no altered mental status, no hematemesis, and no hematochezia/melena.  Marland Kitchen  FINDINGS: Aortic angiogram:   Normal course caliber and contour of the abdominal aorta. Multiple segmental vessels fill within the lower thoracic spine and lumbar spine. No aneurysm dissection flap identified. No significant atherosclerotic changes.  Steep angled take-off of the bilateral common iliac arteries.  Single bilateral renal arteries.  No celiac artery origin.  Superior mesenteric artery patent, with configuration of a celiacomesenteric trunk.  Late phase of the aortic rind demonstrates no evidence of bleeding.  Celiacomesenteric angiogram:  Normal caliber and contour of the origin of the trunk. Multiple jejunal branches present. Normal caliber and contour of the ileocolic artery. Multiple tortuous vessels overlie the proximal 3rd of the trunk, with evidence of a replaced common hepatic artery contributing to both right and left hepatic arteries. There is a replaced vessel representing a celiac vessel, with cross-filling at a bifurcation to the splenic artery and the hepatic arteries. Patent left gastric artery. New  Late phase of the trunk injection demonstrates no evidence of bleeding. No tumor blush or pooling of contrast. Patent portal venous system identified.  Common hepatic artery injection:  Replaced common hepatic artery with right hepatic arteries and left hepatic arteries  from the replaced trunk. No evidence of tumor blush, aneurysm, dissection flap, pulling of contrast. No abnormal enhancement identified. No pseudoaneurysm identified.  No call it artery injection:  Patent middle call it artery of normal caliber. Significant cross flow from collateral vessels with no pooling of contrast, pseudoaneurysm, or tumor blush.  Proximal jejunal arcade:  Normal course caliber and contour. Normal blush of the proximal jejunal mucosa. No filling defects, or pseudoaneurysm.  No embolization was performed.  IMPRESSION: Status post diagnostic mesenteric angiogram.  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  No bleeding was identified from the replaced common hepatic artery, which would be the best candidate for supplying the pancreaticoduodenal arteries near the known proximal duodenum ulcer. Because no bleeding was identified of this variant anatomy, no empiric embolization was performed.  Signed,  Dulcy Fanny. Earleen Newport, DO  [facility]  [facility] Radiology  PLAN: The patient will require 6 hours flattened bed with log roll only for hemostasis of the right common femoral artery after manual compression.  Agree with ICU management, including serial hemoglobin and hematocrit checks.   Electronically Signed   By: Corrie Mckusick D.O.   On: 05/05/2014 16:37   Ir US Guide Vasc Access Right  05/05/2014   INDICATION: 34 year old female admitted to the hospital with upper GI bleeding and hematemesis.  She was admitted April 26, 2013. Upper endoscopy demonstrates duodenum ulcer.  She has been stable over 1 week, though developed syncopal episode and acute anemia with a hemoglobin of 5.7 on today's date. She has been referred for evaluation and treatment of the duodenum ulcer hemorrhage.  EXAM: 1. AORTIC ANGIOGRAM 2. SUPERIOR MESENTERIC ARTERIOGRAM 3. SUB SELECTION OF FOUR TERTIARY BRANCHES FROM THE SUPERIOR MESENTERIC ARTERY 4. ULTRASOUND GUIDED VASCULAR ACCESS OF THE RIGHT COMMON FEMORAL ARTERY 5. MANUAL PRESSURE FOR HEMOSTASIS.  COMPARISON:  CT abdomen pelvis, 04/27/2013, 09/22/2001  MEDICATIONS: Versed 2.0 mg IV; Fentanyl 100 mcg IV  CONTRAST:  113m OMNIPAQUE IOHEXOL 300 MG/ML  SOLN  ANESTHESIA/SEDATION: One hundred fifty minutes  FLUOROSCOPY TIME:  Forty-one minutes.  Fifty-four seconds.  2941 mGy  ACCESS: Right common femoral artery; hemostasis achieved with manual compression.  COMPLICATIONS: None immediate  TECHNIQUE: Informed written consent was obtained from the patient after a discussion of the risks, benefits and alternatives to  treatment. Questions regarding the procedure were encouraged and answered. A timeout was performed prior to the initiation of the procedure.  The right groin was prepped and draped in the usual sterile fashion, and a sterile drape was applied covering the operative field. Maximum barrier sterile technique with sterile gowns and gloves were used for the procedure. A timeout was performed prior to the initiation of the procedure. Local anesthesia was provided with 1% lidocaine.  Physical exam was performed to identify the location of the right common femoral artery. Ultrasound survey was performed with images stored sent to PACs.  Once the common femoral artery was identified under ultrasound, ultrasound guidance was used to generously infiltrate the skin and subcutaneous tissues with 1% lidocaine without epinephrine. A micropuncture access was then used to puncture the right common femoral artery. With excellent blood flow returned, and micro wire was advanced through the needle observed under fluoroscopy to enter the iliac system. The needle was removed from wire a micropuncture kit was advanced over the wire. The inner dilator and cannula were removed with a wire, and an 035 Bentson wire was advanced into the abdominal aorta  under fluoroscopy the micro puncture was removed, and a 5 French sheath was advanced over the Bentson wire into the common femoral artery. The dilator was removed and the sheath was flushed.  A pigtail Flush catheter was advanced over the Bentson wire into the lower thoracic aorta. Aortic flush angiogram performed.  Pigtail catheter was then exchanged over wire for C2 Cobra catheter which was used to select the superior mesenteric artery. Superior mesenteric artery angiogram was performed.  Subsequently, a combination of the micro catheter a micro wire were used to select various branches of superior mesenteric artery in this patient with a celiacomesenteric trunk. A standard renegade micro  catheter as well as a soft synchro an a standard .016 synchro wire were used to select branches of the superior mesenteric artery. Sub selection included:  Replaced common hepatic artery.  Right hepatic artery.  New middle colicky artery.  Proximal jejunal branches.  Two base catheter exchanges were made on the Bentson wire including exchange for Mickelson catheter and exchanged for a C2 Cobra catheter for formation of a Waltman loop.  After dedicated angiogram of each of the sub selected arteries, all catheters and wires were removed.  Angiogram through the sheath was performed with the common femoral artery.  Deployment of an Exoseal device for hemostasis failed without deployment. Manual pressure was used for hemostasis of the right common femoral artery.  Patient tolerated procedure well and remained hemodynamically stable throughout. At the end of the procedure, the patient's vital signs were within normal range. Systolic brought pressure 110. Heart rate of 80-90. No syncopal events, no altered mental status, no hematemesis, and no hematochezia/melena.  Marland Kitchen  FINDINGS: Aortic angiogram:  Normal course caliber and contour of the abdominal aorta. Multiple segmental vessels fill within the lower thoracic spine and lumbar spine. No aneurysm dissection flap identified. No significant atherosclerotic changes.  Steep angled take-off of the bilateral common iliac arteries.  Single bilateral renal arteries.  No celiac artery origin.  Superior mesenteric artery patent, with configuration of a celiacomesenteric trunk.  Late phase of the aortic rind demonstrates no evidence of bleeding.  Celiacomesenteric angiogram:  Normal caliber and contour of the origin of the trunk. Multiple jejunal branches present. Normal caliber and contour of the ileocolic artery. Multiple tortuous vessels overlie the proximal 3rd of the trunk, with evidence of a replaced common hepatic artery contributing to both right and left hepatic arteries.  There is a replaced vessel representing a celiac vessel, with cross-filling at a bifurcation to the splenic artery and the hepatic arteries. Patent left gastric artery. New  Late phase of the trunk injection demonstrates no evidence of bleeding. No tumor blush or pooling of contrast. Patent portal venous system identified.  Common hepatic artery injection:  Replaced common hepatic artery with right hepatic arteries and left hepatic arteries from the replaced trunk. No evidence of tumor blush, aneurysm, dissection flap, pulling of contrast. No abnormal enhancement identified. No pseudoaneurysm identified.  No call it artery injection:  Patent middle call it artery of normal caliber. Significant cross flow from collateral vessels with no pooling of contrast, pseudoaneurysm, or tumor blush.  Proximal jejunal arcade:  Normal course caliber and contour. Normal blush of the proximal jejunal mucosa. No filling defects, or pseudoaneurysm.  No embolization was performed.  IMPRESSION: Status post diagnostic mesenteric angiogram.  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  No bleeding was identified from the replaced common hepatic artery, which would be the best  candidate for supplying the pancreaticoduodenal arteries near the known proximal duodenum ulcer. Because no bleeding was identified of this variant anatomy, no empiric embolization was performed.  Signed,  Dulcy Fanny. Earleen Newport, DO  [facility]  [facility]  PLAN: The patient will require 6 hours flattened bed with log roll only for hemostasis of the right common femoral artery after manual compression.  Agree with ICU management, including serial hemoglobin and hematocrit checks.   Electronically Signed   By: Corrie Mckusick D.O.   On: 05/05/2014 16:37     Today   Subjective:   Kimberly Weiss today has no headache,no chest abdominal pain,no new  weakness tingling or numbness, feels much better TODAY.  Objective:   Blood pressure 88/60, pulse 62, temperature 98.8 F (37.1 C), temperature source Oral, resp. rate 18, height '5\' 3"'  (1.6 m), weight 68.901 kg (151 lb 14.4 oz), last menstrual period 05/09/2014, SpO2 99 %.  Intake/Output Summary (Last 24 hours) at 05/20/14 1518 Last data filed at 05/20/14 1435  Gross per 24 hour  Intake   2227 ml  Output    350 ml  Net   1877 ml    Exam Awake Alert, Oriented *3, No new F.N deficits, Normal affect [place].AT,PERRAL Supple Neck,No JVD, No cervical lymphadenopathy appriciated.  Symmetrical Chest wall movement, Good air movement bilaterally, CTAB RRR,No Gallops,Rubs or new Murmurs, No Parasternal Heave +ve B.Sounds, Abd Soft, minimal pain, No organomegaly appriciated, No rebound -guarding or rigidity. No Cyanosis, Clubbing or edema, No new Rash or bruise  Data Review   Cultures -  Results for orders placed or performed during the hospital encounter of 05/16/14  Clostridium Difficile by PCR     Status: None   Collection Time: 05/16/14  3:04 PM  Result Value Ref Range Status   C difficile by pcr NEGATIVE NEGATIVE Final     CBC w Diff: Lab Results  Component Value Date   WBC 8.1 05/20/2014   HGB 8.1* 05/20/2014   HGB 13.2 06/08/2012   HCT 25.7* 05/20/2014   PLT 373 05/20/2014   LYMPHOPCT 14 05/16/2014   MONOPCT 9 05/16/2014   EOSPCT 0 05/16/2014   BASOPCT 0 05/16/2014   CMP: Lab Results  Component Value Date   NA 140 05/19/2014   K 3.5 05/19/2014   CL 111 05/19/2014   CO2 24 05/19/2014   BUN <5* 05/19/2014   CREATININE 0.65 05/19/2014   PROT 4.6* 05/04/2014   ALBUMIN 2.3* 05/04/2014   BILITOT 0.5 05/04/2014   ALKPHOS 59 05/04/2014   AST 13 05/04/2014   ALT 11 05/04/2014  .  Micro Results Recent Results (from the past 240 hour(s))  Urine culture     Status: None   Collection Time: 05/12/14 10:40 PM  Result Value Ref Range Status   Specimen Description URINE,  CLEAN CATCH  Final   Special Requests NONE  Final   Colony Count   Final    50,000 COLONIES/ML Performed at [facility]    Culture   Final    Multiple bacterial morphotypes present, none predominant. Suggest appropriate recollection if clinically indicated. Performed at [facility]    Report Status 05/14/2014 FINAL  Final  Clostridium Difficile by PCR     Status: None   Collection Time: 05/16/14  3:04 PM  Result Value Ref Range Status   C difficile by pcr NEGATIVE NEGATIVE Final     Discharge Instructions      Follow-up Information  Follow up with [facility] E, MD. Schedule an appointment as soon as possible for a visit in 2 weeks.   Specialty:  General Surgery   Contact information:   [place] [address] [phone]       Follow up with [facility].   Why:  Home Health RN   Contact information:   [address] [place] [address] [phone]       Follow up with Lear Ng., MD.   Specialty:  Gastroenterology   Why:  [id number]  AT 2:00 PM   Contact information:   [address] [place] [address] [phone]       Follow up with Sallee Lange, MD. Schedule an appointment as soon as possible for a visit in 1 week.   Specialty:  Family Medicine   Why:  Posthospitalization follow-up   Contact information:   [place] [address] [phone]       Discharge Medications     Medication List    STOP taking these medications        docusate sodium 100 MG capsule  Commonly known as:  COLACE     hydrochlorothiazide 25 MG tablet  Commonly known as:  HYDRODIURIL     oxyCODONE-acetaminophen 10-325 MG per tablet  Commonly known as:  PERCOCET      TAKE these medications        albuterol 108 (90 BASE) MCG/ACT inhaler  Commonly known as:  PROVENTIL HFA;VENTOLIN HFA  Inhale 2 puffs into the lungs every 6 (six) hours as needed for  wheezing or shortness of breath.     amitriptyline 50 MG tablet  Commonly known as:  ELAVIL  TAKE 1 TABLET (50 MG TOTAL) BY MOUTH AT BEDTIME.     clonazePAM 1 MG tablet  Commonly known as:  KLONOPIN  1/2-1 po BID prn panic attacks     ferrous sulfate 325 (65 FE) MG tablet  Take 1 tablet (325 mg total) by mouth 2 (two) times daily with a meal.     levothyroxine 50 MCG tablet  Commonly known as:  SYNTHROID, LEVOTHROID  TAKE 1 TABLET EVERY DAY     nicotine 14 mg/24hr patch  Commonly known as:  NICODERM CQ - dosed in mg/24 hours  Place 1 patch (14 mg total) onto the skin daily.     pantoprazole 40 MG tablet  Commonly known as:  PROTONIX  Take 1 tablet (40 mg total) by mouth 2 (two) times daily.     saccharomyces boulardii 250 MG capsule  Commonly known as:  FLORASTOR  Take 1 capsule (250 mg total) by mouth 2 (two) times daily.     sucralfate 1 GM/10ML suspension  Commonly known as:  CARAFATE  Take 10 mLs (1 g total) by mouth 4 (four) times daily -  with meals and at bedtime.     venlafaxine XR 150 MG 24 hr capsule  Commonly known as:  EFFEXOR-XR  Take 150 mg by mouth daily with breakfast.         Total Time in preparing paper work, data evaluation and todays exam - 35 minutes  Shirlee Whitmire M.D on 05/20/2014 at 3:18 PM  [facility] Office  [phone]

## 2014-05-20 NOTE — Progress Notes (Signed)
NUTRITION FOLLOW-UP  INTERVENTION: Continue providing Ensure Enlive, each provides 350 kcal, 20 grams of protein and 180 ml of free water  NUTRITION DIAGNOSIS: Inadequate oral intake related to decreased appetite as evidenced by poor PO intake; ongoing  Goal: Pt to meet >/= 90% of estimated needs; ongoing  Monitor:  Diet advancement, PO intake, weight trends, labs  ASSESSMENT: Pt with history of chronic back pain and narcotic dependence, hypothyroidism, recent admission and discharge for severe upper GI bleed.  S/p exploratory laparotomy with oversew of duodenal ulcer. Complains of diarrhea, nausea/vomiting and generalized weakness since discharge.  3/18- Per wt records pt has lost 7 lbs in the last 3 days. Otherwise weight has been stable over the past year. Pt reports only drinking Boost and energy drinks since discharge.    3/21- Pt had episode of emesis this morning. She is feeling better, and tolerates the Ensure. PO intake ranges from 25-100%.   Labs and medications reviewed: low Ca, Creatinine   Height: Ht Readings from Last 1 Encounters:  05/17/14 5\' 3"  (1.6 m)    Weight: Wt Readings from Last 1 Encounters:  05/20/14 151 lb 14.4 oz (68.901 kg)    BMI:  Body mass index is 26.91 kg/(m^2). Overweight  Estimated Nutritional Needs: Kcal: 1700-1900 Protein: 75-90 g protein Fluid: >/= 1.8 L  Skin: Incisions on abdomen and right groin  Diet Order: DIET SOFT  Intake/Output Summary (Last 24 hours) at 05/20/14 1311 Last data filed at 05/20/14 0947  Gross per 24 hour  Intake   1870 ml  Output    350 ml  Net   1520 ml    Last BM: 3/21  Labs:   Recent Labs Lab 05/17/14 0704 05/18/14 0500 05/19/14 0610  NA 139 137 140  K 3.6 3.8 3.5  CL 114* 109 111  CO2 21 21 24   BUN 8 <5* <5*  CREATININE 0.65 0.68 0.65  CALCIUM 7.8* 8.2* 7.9*  GLUCOSE 123* 81 89    CBG (last 3)  No results for input(s): GLUCAP in the last 72 hours.  Scheduled Meds: . clonazePAM   0.25 mg Oral BID  . feeding supplement (ENSURE COMPLETE)  237 mL Oral BID BM  . levothyroxine  50 mcg Oral QAC breakfast  . nicotine  14 mg Transdermal Daily  . pantoprazole  40 mg Oral BID  . saccharomyces boulardii  250 mg Oral BID  . sucralfate  1 g Oral TID WC & HS  . venlafaxine XR  150 mg Oral Q breakfast    Continuous Infusions:    Cristela FeltElisa Grant, MS Dietetic Intern Pager: [phone]

## 2014-05-21 LAB — GI PATHOGEN PANEL BY PCR, STOOL
C difficile toxin A/B: NOT DETECTED
Campylobacter by PCR: NOT DETECTED
Cryptosporidium by PCR: NOT DETECTED
E coli (ETEC) LT/ST: NOT DETECTED
E coli (STEC): NOT DETECTED
E coli 0157 by PCR: NOT DETECTED
G lamblia by PCR: NOT DETECTED
Norovirus GI/GII: NOT DETECTED
Rotavirus A by PCR: NOT DETECTED
Salmonella by PCR: NOT DETECTED
Shigella by PCR: NOT DETECTED

## 2014-05-23 NOTE — Psychosocial Assessment (Cosign Needed)
Clinical Social Work Department BRIEF PSYCHOSOCIAL ASSESSMENT 05/23/2014  Patient:  Kimberly Weiss,Kimberly Weiss     Account Number:  [id number]     Admit date:  05/16/2014  Clinical Social Worker:  Corlis HoveMCLEAN,Kasaundra Fahrney, CLINICAL SOCIAL WORKER  Date/Time:  05/18/2014 12:00 M  Referred by:  Physician  Date Referred:  05/18/2014 Referred for  Transportation assistance   Other Referral:   Interview type:  Patient Other interview type:    PSYCHOSOCIAL DATA Living Status:  FAMILY Admitted from facility:   Level of care:   Primary support name:  Kimberly AxonJim Weiss Primary support relationship to patient:  PARENT Degree of support available:   Patient states father is supportive. He was not currently present at bedside. Patient states her 35 year-old son also lives with her.    CURRENT CONCERNS Current Concerns  Other - See comment   Other Concerns:   Transportation Assistance    SOCIAL WORK ASSESSMENT / PLAN CSW Intern spoke with patient about concern for transportation assistance. Patient stated she was afraid to drive because she previously fell unconscious at home. Patient stated she did not know how she was going to get to doctors appointments. Patient also stated she did not have any income and has been trying to get disability for years. She also stated that she has anxiety and has no one to talk to anymore. Patient agrees for CSW Intern to bring back medical transportation resources, outpatient therapy resources, and disability assistance resources. CSW Intern to follow up with patient to provide resources. CSW remains available for support.   Assessment/plan status:  Psychosocial Support/Ongoing Assessment of Needs Other assessment/ plan:   Information/referral to community resources:   CSW Intern to provide patient with medical transportation information, outpatient therapy and behavioral health listings, and a disability assistance referral.    PATIENT'S/FAMILY'S RESPONSE TO PLAN OF CARE: Patient  alert and oriented x3 in bed. No family or friends currently present at bedside. Patient tearful and crying about her medical condition. CSW Intern offered support. Patient engaged in CSW Intern's assessment and agreeable with discharge plan. Patient understanding of Social Work role and appreciative of support.

## 2014-06-03 ENCOUNTER — Ambulatory Visit (INDEPENDENT_AMBULATORY_CARE_PROVIDER_SITE_OTHER): Payer: Medicaid Other | Admitting: Family Medicine

## 2014-06-03 ENCOUNTER — Encounter: Payer: Self-pay | Admitting: Family Medicine

## 2014-06-03 VITALS — BP 128/80 | Ht 62.0 in | Wt 156.0 lb

## 2014-06-03 DIAGNOSIS — R52 Pain, unspecified: Secondary | ICD-10-CM

## 2014-06-03 DIAGNOSIS — K264 Chronic or unspecified duodenal ulcer with hemorrhage: Secondary | ICD-10-CM

## 2014-06-03 DIAGNOSIS — F419 Anxiety disorder, unspecified: Secondary | ICD-10-CM

## 2014-06-03 DIAGNOSIS — F329 Major depressive disorder, single episode, unspecified: Secondary | ICD-10-CM

## 2014-06-03 DIAGNOSIS — F418 Other specified anxiety disorders: Secondary | ICD-10-CM | POA: Diagnosis not present

## 2014-06-03 DIAGNOSIS — F32A Anxiety disorder, unspecified: Secondary | ICD-10-CM

## 2014-06-03 DIAGNOSIS — Z5189 Encounter for other specified aftercare: Secondary | ICD-10-CM

## 2014-06-03 MED ORDER — CLONAZEPAM 1 MG PO TABS: ORAL_TABLET | ORAL | Status: DC

## 2014-06-03 MED ORDER — PANTOPRAZOLE SODIUM 40 MG PO TBEC: 40.0000 mg | DELAYED_RELEASE_TABLET | Freq: Two times a day (BID) | ORAL | Status: DC

## 2014-06-03 MED ORDER — OXYCODONE-ACETAMINOPHEN 10-325 MG PO TABS: 1.0000 | ORAL_TABLET | Freq: Four times a day (QID) | ORAL | Status: DC | PRN

## 2014-06-03 MED ORDER — SUCRALFATE 1 GM/10ML PO SUSP: 1.0000 g | Freq: Three times a day (TID) | ORAL | Status: DC

## 2014-06-03 NOTE — Progress Notes (Signed)
   Subjective:    Patient ID: Kimberly Weiss, female    DOB: 07/14/1980, 34 y.o.   MRN: [id number]  HPI Patient is here today for a hospital follow up.  Went for diarrhea/abdominal pain.  Had a ulcer that ruptured and lost a lot of blood.  Surgery She has chronic back pain she is under the care of pain management but they cannot see her until later in April she is out of medications currently she denies abusing medications  Greater than 25 minutes was spent with this patient going over hospitalization going over all surgical going over treatment will blood transfusions anemia the fatigue and tiredness she's been having as well as being stressed and anxious. She denies being suicidal.  Review of Systems  Constitutional: Negative for activity change, appetite change and fatigue.  HENT: Negative for congestion.   Respiratory: Negative for cough.   Cardiovascular: Negative for chest pain.  Gastrointestinal: Negative for nausea, vomiting, abdominal pain, blood in stool and rectal pain.  Endocrine: Negative for polydipsia and polyphagia.  Neurological: Negative for weakness.  Psychiatric/Behavioral: Negative for confusion.       Objective:   Physical Exam  Constitutional: She appears well-nourished. No distress.  Cardiovascular: Normal rate, regular rhythm and normal heart sounds.   No murmur heard. Pulmonary/Chest: Effort normal and breath sounds normal. No respiratory distress.  Musculoskeletal: She exhibits no edema.  Lymphadenopathy:    She has no cervical adenopathy.  Neurological: She is alert. She exhibits normal muscle tone.  Psychiatric: Her behavior is normal.  Vitals reviewed.         Assessment & Plan:  1. Duodenal ulcer with hemorrhage This was taking care of with surgery she is very tender in her surgical scar but doing better no signs of bleeding check CBC - CBC with Differential/Platelet  2. Hypocalcemia She had hypocalcemia while in the hospital we will  check metabolic profile she is eating more regularly. - Basic metabolic panel  3. Pain management She has chronic pain in her back oxycodone prescribed maximum 4 times per day she is to follow-up with pain management later this month they will reassume treatment of this issue if she needs to have any surgery I would recommend waiting until at least June or July.  4. Anxiety and depression She is very stressed out anxious having panic attacks related to recent illness she may use clonazepam maximum of 3 times a day over the next couple months then I would recommend tapering it down

## 2014-06-04 ENCOUNTER — Encounter: Payer: Self-pay | Admitting: Family Medicine

## 2014-06-04 LAB — CBC WITH DIFFERENTIAL/PLATELET
Basophils Absolute: 0.1 10*3/uL (ref 0.0–0.2)
Basos: 1 %
Eos: 5 %
Eosinophils Absolute: 0.4 10*3/uL (ref 0.0–0.4)
HCT: 34.4 % (ref 34.0–46.6)
Hemoglobin: 10.4 g/dL — ABNORMAL LOW (ref 11.1–15.9)
Immature Grans (Abs): 0 10*3/uL (ref 0.0–0.1)
Immature Granulocytes: 0 %
Lymphocytes Absolute: 2.1 10*3/uL (ref 0.7–3.1)
Lymphs: 23 %
MCH: 27.2 pg (ref 26.6–33.0)
MCHC: 30.2 g/dL — ABNORMAL LOW (ref 31.5–35.7)
MCV: 90 fL (ref 79–97)
Monocytes Absolute: 0.9 10*3/uL (ref 0.1–0.9)
Monocytes: 9 %
Neutrophils Absolute: 5.9 10*3/uL (ref 1.4–7.0)
Neutrophils Relative %: 62 %
Platelets: 369 10*3/uL (ref 150–379)
RBC: 3.82 x10E6/uL (ref 3.77–5.28)
RDW: 14.4 % (ref 12.3–15.4)
WBC: 9.4 10*3/uL (ref 3.4–10.8)

## 2014-06-04 LAB — BASIC METABOLIC PANEL
BUN/Creatinine Ratio: 13 (ref 8–20)
BUN: 10 mg/dL (ref 6–20)
CO2: 23 mmol/L (ref 18–29)
Calcium: 9.5 mg/dL (ref 8.7–10.2)
Chloride: 103 mmol/L (ref 97–108)
Creatinine, Ser: 0.76 mg/dL (ref 0.57–1.00)
GFR calc Af Amer: 118 mL/min/{1.73_m2} (ref >59)
GFR calc non Af Amer: 103 mL/min/{1.73_m2} (ref >59)
Glucose: 81 mg/dL (ref 65–99)
Potassium: 4.5 mmol/L (ref 3.5–5.2)
Sodium: 141 mmol/L (ref 134–144)

## 2014-06-07 ENCOUNTER — Other Ambulatory Visit: Payer: Self-pay | Admitting: Family Medicine

## 2014-06-21 ENCOUNTER — Telehealth: Payer: Self-pay | Admitting: Family Medicine

## 2014-06-21 NOTE — Telephone Encounter (Signed)
#  1 no, no refill #2- she would need to have the record from this pain management doctor sent here including the urine drug screen before we will make any decision ( if pt doesn't know- have [place]  See if we referred to pain management ,who it was then call and get what is going on or have UDT sent here

## 2014-06-21 NOTE — Telephone Encounter (Signed)
I spoke with pain management. The patient failed a urine drug screen. It showed soma as being in her system. The patient admitted that she had taken some of her dad's soma to the pain management facility. The pain management facility stated that sometimes her pill count is off and they are having her go through a drug addiction evaluation before they prescribed her more medicine. We will not prescribe pain medication for this patient nor are we taking back that responsibility. This is under the guidance of the pain management center. (Secondly it is our office policy not to reaccept pain management when they have been under the care of a pain management center unless everything was reviewed and 100% kosher. Given what is going on with this patient our practice will not be prescribing controlled medications now or in the future for pain. A letter regarding this will be sent to the patient.)  Nurses please call patient relating that we are not prescribing pain medicine.

## 2014-06-21 NOTE — Telephone Encounter (Signed)
Patient was notified. Awaiting call back from pain management specialist.

## 2014-06-21 NOTE — Telephone Encounter (Signed)
Patient called this morning after her visit at pain management who cancelled her appointment after urine test and wanted a refill on hydrocodone 10/325.

## 2014-06-21 NOTE — Telephone Encounter (Signed)
Notified patient Dr. Lorin PicketScott spoke with pain management. The patient failed a urine drug screen. It showed soma as being in her system. The patient admitted that she had taken some of her dad's soma to the pain management facility. The pain management facility stated that sometimes her pill count is off and they are having her go through a drug addiction evaluation before they prescribed her more medicine. We will not prescribe pain medication for this patient nor are we taking back that responsibility. This is under the guidance of the pain management center. (Secondly it is our office policy not to reaccept pain management when they have been under the care of a pain management center unless everything was reviewed and 100% kosher. Given what is going on with this patient our practice will not be prescribing controlled medications now or in the future for pain. A letter regarding this will be sent to the patient.) Patient was extremely upset and threatened to transfer her records from our practice.

## 2014-06-23 ENCOUNTER — Telehealth: Payer: Self-pay | Admitting: Family Medicine

## 2014-06-23 NOTE — Telephone Encounter (Signed)
It should be noted that this patient has had several different issues recently. Recently she stated after getting out of the hospital that she did not have pain medications and would not be seen the pain management doctor until later in the month. She was given a prescription to cover this timeframe. According to the pain management center and this is something that she should not have done. It was also noted that she called our office at the end of this week stating that the pain management doctor would not refill her medicines and wanted us to refill these. Through discussion it was found out that she failed a urine drug screen. I spoke with the pain management center and they stated that the patient showed positive for soma. Apparently the patient states that she took some of her father's soma. The pain management center also stated that on several different times her pill counts were not right. They also stated that one time a bottle that had medicines in it was not the pain medicine that they prescribed. They told the patient that she had to go for further evaluation before they would resume any pain medicine. After reviewing of this previous episode and further person-to-person discussion with the pain management center of her recent issues it was determined that our office should not prescribe pain medicine. It is also are standing policy not to reaccept management of pain prescriptions when the patient has been under the care of a pain management doctor. All of this was informed to the patient by our nurse Janalyn Shyalandra Johnson. This was handled in a very professional manner. The patient was very mad. She was mad that the pain management doctor caused her problems and she was also mad that our office would not prescribe medications. She stated that she would be getting a different doctor. It is in the patient's best interest that she will remain with the pain management center and we will not be able to resume  any pain management for this patient ever. Our office will be sending the patient a letter reaffirming that we will not be doing pain management. In addition to this she will need to seek care through mental health for any ongoing nerve medication. According to the patient's phone call she is more than likely to transfer to a different physician.

## 2014-06-25 NOTE — Telephone Encounter (Signed)
noted 

## 2014-06-26 ENCOUNTER — Telehealth: Payer: Self-pay | Admitting: Family Medicine

## 2014-06-26 NOTE — Telephone Encounter (Signed)
Patient was notified that per Dr. Lorin PicketScott under no circumstances will anyone in our office prescribe narcotic pain medications to her now or in the future. Patient was not happy but she verbalized understanding.

## 2014-06-26 NOTE — Telephone Encounter (Signed)
Pt wants Dr. Lorin PicketScott to personally call him back. Pt states that she is no Longer going to be going to the pain management center. Pt states that She does not want to talk to the nurse because the nurse didn't understand What she was trying to say. She wants Dr. Lorin PicketScott to call her after 5pm.

## 2014-07-19 ENCOUNTER — Telehealth: Payer: Self-pay | Admitting: Family Medicine

## 2014-07-19 NOTE — Telephone Encounter (Signed)
Discussed with pt

## 2014-07-19 NOTE — Telephone Encounter (Signed)
Likely ok on occasion

## 2014-07-19 NOTE — Telephone Encounter (Signed)
Patient had emergency surgery in March due to ulcer.  She was advised not to drink alcohol or caffeine.  She said she has been doing great with it and wants to know if she can have a glass of red wine since it has been so long.  She is hoping to hear something today.

## 2014-07-23 ENCOUNTER — Other Ambulatory Visit: Payer: Self-pay | Admitting: Family Medicine

## 2014-07-30 ENCOUNTER — Telehealth: Payer: Self-pay | Admitting: Family Medicine

## 2014-07-30 ENCOUNTER — Other Ambulatory Visit: Payer: Self-pay | Admitting: Family Medicine

## 2014-07-30 MED ORDER — HYDROCHLOROTHIAZIDE 25 MG PO TABS: 25.0000 mg | ORAL_TABLET | Freq: Every day | ORAL | Status: DC

## 2014-07-30 MED ORDER — POTASSIUM CHLORIDE ER 10 MEQ PO TBCR: EXTENDED_RELEASE_TABLET | ORAL | Status: DC

## 2014-07-30 NOTE — Telephone Encounter (Signed)
Pt wants to know if she can go back on the hydrochlorothiazide. Pt states that she is bloated from drinking water.

## 2014-07-30 NOTE — Telephone Encounter (Signed)
May use occasionally, but needs to take potassium 10 meq 1 qd with the HCTZ, may send in 30 with 3 rf of each, follow up within 3 months will need labs in  Sept

## 2014-07-30 NOTE — Telephone Encounter (Signed)
Rx sent electronically to pharmacy. Patient notified. 

## 2014-07-30 NOTE — Telephone Encounter (Signed)
Patient advised May use occasionally, but needs to take potassium 10 meq 1 qd with the HCTZ, may send in 30 with 3 rf of each, follow up within 3 months will need labs in Sept. Patient verbalized understanding.

## 2014-08-05 ENCOUNTER — Other Ambulatory Visit: Payer: Self-pay | Admitting: Family Medicine

## 2014-08-05 NOTE — Telephone Encounter (Signed)
1 refill 

## 2014-08-30 ENCOUNTER — Other Ambulatory Visit: Payer: Self-pay | Admitting: Family Medicine

## 2014-09-12 ENCOUNTER — Other Ambulatory Visit: Payer: Self-pay | Admitting: Internal Medicine

## 2014-09-26 ENCOUNTER — Other Ambulatory Visit: Payer: Self-pay | Admitting: Family Medicine

## 2014-10-23 ENCOUNTER — Other Ambulatory Visit: Payer: Self-pay | Admitting: Family Medicine

## 2014-10-28 ENCOUNTER — Telehealth: Payer: Self-pay | Admitting: Family Medicine

## 2014-10-28 ENCOUNTER — Other Ambulatory Visit: Payer: Self-pay | Admitting: *Deleted

## 2014-10-28 DIAGNOSIS — F4323 Adjustment disorder with mixed anxiety and depressed mood: Secondary | ICD-10-CM

## 2014-10-28 MED ORDER — CLONAZEPAM 1 MG PO TABS: ORAL_TABLET | ORAL | Status: DC

## 2014-10-28 NOTE — Telephone Encounter (Signed)
Pt would like for her Psych referral to be high priority to get this  In order before she runs out of meds again. She does not feel like  She can be without this med due to how she feels not being on it.   Please an thank you

## 2014-10-29 ENCOUNTER — Encounter: Payer: Self-pay | Admitting: Family Medicine

## 2014-10-30 NOTE — Telephone Encounter (Signed)
Referral faxed awaiting scheduling info

## 2014-10-31 ENCOUNTER — Telehealth (HOSPITAL_COMMUNITY): Payer: Self-pay | Admitting: Licensed Clinical Social Worker

## 2014-11-06 NOTE — Telephone Encounter (Signed)
fvazf

## 2014-11-06 NOTE — Telephone Encounter (Signed)
xsgt

## 2014-11-07 ENCOUNTER — Other Ambulatory Visit: Payer: Self-pay | Admitting: Family Medicine

## 2014-11-07 ENCOUNTER — Other Ambulatory Visit: Payer: Self-pay | Admitting: *Deleted

## 2014-11-07 MED ORDER — LEVOTHYROXINE SODIUM 50 MCG PO TABS: 50.0000 ug | ORAL_TABLET | Freq: Every day | ORAL | Status: DC

## 2014-11-10 ENCOUNTER — Other Ambulatory Visit: Payer: Self-pay | Admitting: Family Medicine

## 2014-11-11 ENCOUNTER — Ambulatory Visit (INDEPENDENT_AMBULATORY_CARE_PROVIDER_SITE_OTHER): Payer: Medicaid Other | Admitting: Family Medicine

## 2014-11-11 ENCOUNTER — Encounter: Payer: Self-pay | Admitting: Family Medicine

## 2014-11-11 VITALS — BP 112/74 | Temp 98.9°F | Ht 63.0 in | Wt 166.0 lb

## 2014-11-11 DIAGNOSIS — J019 Acute sinusitis, unspecified: Secondary | ICD-10-CM | POA: Diagnosis not present

## 2014-11-11 DIAGNOSIS — N760 Acute vaginitis: Secondary | ICD-10-CM | POA: Diagnosis not present

## 2014-11-11 DIAGNOSIS — Z029 Encounter for administrative examinations, unspecified: Secondary | ICD-10-CM

## 2014-11-11 DIAGNOSIS — B9689 Other specified bacterial agents as the cause of diseases classified elsewhere: Secondary | ICD-10-CM

## 2014-11-11 DIAGNOSIS — K219 Gastro-esophageal reflux disease without esophagitis: Secondary | ICD-10-CM

## 2014-11-11 DIAGNOSIS — A499 Bacterial infection, unspecified: Secondary | ICD-10-CM

## 2014-11-11 MED ORDER — PROMETHAZINE HCL 25 MG PO TABS: 25.0000 mg | ORAL_TABLET | Freq: Three times a day (TID) | ORAL | Status: DC | PRN

## 2014-11-11 MED ORDER — CEFTRIAXONE SODIUM 1 G IJ SOLR
500.0000 mg | Freq: Once | INTRAMUSCULAR | Status: AC
  Administered 2014-11-11: 500 mg via INTRAMUSCULAR

## 2014-11-11 MED ORDER — FLUCONAZOLE 150 MG PO TABS: 150.0000 mg | ORAL_TABLET | Freq: Once | ORAL | Status: DC

## 2014-11-11 MED ORDER — CEFPROZIL 500 MG PO TABS: 500.0000 mg | ORAL_TABLET | Freq: Two times a day (BID) | ORAL | Status: DC

## 2014-11-11 MED ORDER — SUCRALFATE 1 GM/10ML PO SUSP: ORAL | Status: DC

## 2014-11-11 MED ORDER — PANTOPRAZOLE SODIUM 40 MG PO TBEC: 40.0000 mg | DELAYED_RELEASE_TABLET | Freq: Two times a day (BID) | ORAL | Status: DC

## 2014-11-11 MED ORDER — TRIAMCINOLONE ACETONIDE 0.1 % EX CREA: 1.0000 "application " | TOPICAL_CREAM | Freq: Two times a day (BID) | CUTANEOUS | Status: DC

## 2014-11-11 NOTE — Progress Notes (Signed)
   Subjective:    Patient ID: Kimberly Weiss, female    DOB: Aug 16, 1980, 34 y.o.   MRN: [id number]  HPIVaginal itching. Started last week. Using cream that was prescribed last year by carolyn.   Coughing, ear pain, headache, fever, and abd pain. Started over 1 week ago.   Requesting refill on protonix and carafate.   Requesting rx for phenergan  tablet.  Not on med list. Pt states she takes for nausea caused by ulcer.     Review of Systems     Objective:   Physical Exam Mild sinus tenderness throat normal neck supple lungs clear heart regular abdomen soft no guarding or rebound genitourinary exam shows slight discharge inflamed cervix but no sign of any obvious major issues   No cervical motion tenderness    Assessment & Plan:  Vaginal irritation-use steroid cream twice a day when necessary on the outside only Possible mild bacterial vaginitis-the antibiotics we place her on for her sinus infection should hopefully help I saw no clue cells. GC chlamydia pending Patient having ongoing low back pain seen specialist has been unable to work with this is been debilitated for quite some time Patient with anxiety and depression issues I believe it is in her best interest to have psychiatry work with her she is getting lined up in [place] I encouraged patient to follow-up for a Pap smear in the near future Sinusitis treated with Rocephin as well History of GERD need refills on medications states it helps her She states that again helps her nausea  She is trying to get in with mental health in [place]. Unfortunately there are a few places that take Medicaid. See discussion above. I would be willing to refill her nerve medication again in the near future if she is having difficult time getting appointment in the near future

## 2014-11-13 LAB — GC/CHLAMYDIA PROBE AMP
Chlamydia trachomatis, NAA: NEGATIVE
Neisseria gonorrhoeae by PCR: NEGATIVE

## 2014-11-21 ENCOUNTER — Other Ambulatory Visit: Payer: Self-pay | Admitting: Family Medicine

## 2014-12-04 ENCOUNTER — Ambulatory Visit: Payer: Medicaid Other | Admitting: Nurse Practitioner

## 2014-12-12 ENCOUNTER — Other Ambulatory Visit: Payer: Self-pay | Admitting: Family Medicine

## 2014-12-24 ENCOUNTER — Other Ambulatory Visit: Payer: Self-pay | Admitting: Family Medicine

## 2014-12-26 DIAGNOSIS — Z029 Encounter for administrative examinations, unspecified: Secondary | ICD-10-CM

## 2015-01-11 ENCOUNTER — Encounter (HOSPITAL_COMMUNITY): Payer: Self-pay

## 2015-01-11 DIAGNOSIS — E039 Hypothyroidism, unspecified: Secondary | ICD-10-CM | POA: Diagnosis not present

## 2015-01-11 DIAGNOSIS — Z87448 Personal history of other diseases of urinary system: Secondary | ICD-10-CM | POA: Insufficient documentation

## 2015-01-11 DIAGNOSIS — Z79899 Other long term (current) drug therapy: Secondary | ICD-10-CM | POA: Diagnosis not present

## 2015-01-11 DIAGNOSIS — E663 Overweight: Secondary | ICD-10-CM | POA: Insufficient documentation

## 2015-01-11 DIAGNOSIS — Z720 Tobacco use: Secondary | ICD-10-CM | POA: Insufficient documentation

## 2015-01-11 DIAGNOSIS — Z87442 Personal history of urinary calculi: Secondary | ICD-10-CM | POA: Insufficient documentation

## 2015-01-11 DIAGNOSIS — Z9889 Other specified postprocedural states: Secondary | ICD-10-CM | POA: Diagnosis not present

## 2015-01-11 DIAGNOSIS — Z3202 Encounter for pregnancy test, result negative: Secondary | ICD-10-CM | POA: Insufficient documentation

## 2015-01-11 DIAGNOSIS — K432 Incisional hernia without obstruction or gangrene: Secondary | ICD-10-CM | POA: Insufficient documentation

## 2015-01-11 DIAGNOSIS — R1084 Generalized abdominal pain: Secondary | ICD-10-CM | POA: Diagnosis present

## 2015-01-11 LAB — LIPASE, BLOOD: Lipase: 19 U/L (ref 11–51)

## 2015-01-11 LAB — CBC
HCT: 43.7 % (ref 36.0–46.0)
Hemoglobin: 14.7 g/dL (ref 12.0–15.0)
MCH: 29.8 pg (ref 26.0–34.0)
MCHC: 33.6 g/dL (ref 30.0–36.0)
MCV: 88.5 fL (ref 78.0–100.0)
Platelets: 325 10*3/uL (ref 150–400)
RBC: 4.94 MIL/uL (ref 3.87–5.11)
RDW: 13.6 % (ref 11.5–15.5)
WBC: 9.6 10*3/uL (ref 4.0–10.5)

## 2015-01-11 LAB — URINALYSIS, ROUTINE W REFLEX MICROSCOPIC
Bilirubin Urine: NEGATIVE
Glucose, UA: NEGATIVE mg/dL
Ketones, ur: NEGATIVE mg/dL
Leukocytes, UA: NEGATIVE
Nitrite: NEGATIVE
Protein, ur: NEGATIVE mg/dL
Specific Gravity, Urine: 1.009 (ref 1.005–1.030)
Urobilinogen, UA: 0.2 mg/dL (ref 0.0–1.0)
pH: 7.5 (ref 5.0–8.0)

## 2015-01-11 LAB — COMPREHENSIVE METABOLIC PANEL
ALT: 39 U/L (ref 14–54)
AST: 35 U/L (ref 15–41)
Albumin: 3.7 g/dL (ref 3.5–5.0)
Alkaline Phosphatase: 95 U/L (ref 38–126)
Anion gap: 8 (ref 5–15)
BUN: 10 mg/dL (ref 6–20)
CO2: 27 mmol/L (ref 22–32)
Calcium: 9 mg/dL (ref 8.9–10.3)
Chloride: 99 mmol/L — ABNORMAL LOW (ref 101–111)
Creatinine, Ser: 0.78 mg/dL (ref 0.44–1.00)
GFR calc Af Amer: >60 mL/min (ref >60)
GFR calc non Af Amer: >60 mL/min (ref >60)
Glucose, Bld: 127 mg/dL — ABNORMAL HIGH (ref 65–99)
Potassium: 3.7 mmol/L (ref 3.5–5.1)
Sodium: 134 mmol/L — ABNORMAL LOW (ref 135–145)
Total Bilirubin: 0.3 mg/dL (ref 0.3–1.2)
Total Protein: 6.6 g/dL (ref 6.5–8.1)

## 2015-01-11 LAB — URINE MICROSCOPIC-ADD ON

## 2015-01-11 LAB — POC URINE PREG, ED: Preg Test, Ur: NEGATIVE

## 2015-01-11 NOTE — ED Notes (Signed)
Pt here for generalized abdominal pain, nausea, diarrhea and fever. In march, she had abdominal surgery for ruptured duodenal ulcer. She states she has "a place on my stomach and its just getting bigger, especially in the last week."

## 2015-01-12 ENCOUNTER — Emergency Department (HOSPITAL_COMMUNITY): Payer: Medicaid Other

## 2015-01-12 ENCOUNTER — Emergency Department (HOSPITAL_COMMUNITY)
Admission: EM | Admit: 2015-01-12 | Discharge: 2015-01-12 | Disposition: A | Discharge status: Home / Self Care | Payer: Medicaid Other | Attending: Emergency Medicine | Admitting: Emergency Medicine

## 2015-01-12 DIAGNOSIS — K432 Incisional hernia without obstruction or gangrene: Secondary | ICD-10-CM

## 2015-01-12 DIAGNOSIS — R1084 Generalized abdominal pain: Secondary | ICD-10-CM

## 2015-01-12 LAB — I-STAT CG4 LACTIC ACID, ED
Lactic Acid, Venous: 0.81 mmol/L (ref 0.5–2.0)
Lactic Acid, Venous: 2.22 mmol/L (ref 0.5–2.0)

## 2015-01-12 MED ORDER — IOHEXOL 300 MG/ML  SOLN
100.0000 mL | Freq: Once | INTRAMUSCULAR | Status: AC | PRN
  Administered 2015-01-12: 100 mL via INTRAVENOUS

## 2015-01-12 MED ORDER — SODIUM CHLORIDE 0.9 % IV BOLUS (SEPSIS)
1000.0000 mL | Freq: Once | INTRAVENOUS | Status: AC
  Administered 2015-01-12: 1000 mL via INTRAVENOUS

## 2015-01-12 MED ORDER — PROMETHAZINE HCL 25 MG PO TABS: 25.0000 mg | ORAL_TABLET | Freq: Four times a day (QID) | ORAL | Status: DC | PRN

## 2015-01-12 MED ORDER — PROMETHAZINE HCL 25 MG/ML IJ SOLN
25.0000 mg | Freq: Once | INTRAMUSCULAR | Status: AC
  Administered 2015-01-12: 25 mg via INTRAVENOUS
  Filled 2015-01-12: qty 1

## 2015-01-12 MED ORDER — MORPHINE SULFATE (PF) 4 MG/ML IV SOLN
4.0000 mg | Freq: Once | INTRAVENOUS | Status: AC
  Administered 2015-01-12: 4 mg via INTRAVENOUS
  Filled 2015-01-12: qty 1

## 2015-01-12 NOTE — ED Notes (Signed)
Transport taking pt for CT

## 2015-01-12 NOTE — ED Provider Notes (Signed)
CSN: [id number]     Arrival date & time 01/11/15  2239 History  By signing my name below, I, Terrance Branch, attest that this documentation has been prepared under the direction and in the presence of Shon Baton, MD. Electronically Signed: Evon Slack, ED Scribe. 01/12/2015. 4:01 AM.      Chief Complaint  Patient presents with  . Abdominal Pain   The history is provided by the patient. No language interpreter was used.   HPI Comments: JULIET VASBINDER is a 34 y.o. female who presents to the Emergency Department complaining of generalized abdominal pain onset 2 week. Pt rates the severity of her pain 10/10. Pt states that she has noticed a worsening "knot" on her abdomen that is painful and getting larger in size. Pt reports associated nausea and diarrhea. Pt states that she has tried her prescribed oxycodone with no relief. Pt reports HX abdominal surgery for a ruptured duodenal ulcer. PT denies vomiting or other related symptoms.     Past Medical History  Diagnosis Date  . Renal disorder   . Interstitial cystitis   . Thyroid disease   . Hypothyroidism   . Renal stones   . Back pain   . GI bleed    Past Surgical History  Procedure Laterality Date  . Esophagogastroduodenoscopy N/A 04/27/2014    Procedure: ESOPHAGOGASTRODUODENOSCOPY (EGD);  Surgeon: Shirley Friar, MD;  Location: [facility] ENDOSCOPY;  Service: Endoscopy;  Laterality: N/A;  . Esophagogastroduodenoscopy (egd) with propofol N/A 05/03/2014    Procedure: ESOPHAGOGASTRODUODENOSCOPY (EGD) WITH PROPOFOL;  Surgeon: Shirley Friar, MD;  Location: [facility] ENDOSCOPY;  Service: Endoscopy;  Laterality: N/A;  . Laparotomy N/A 05/07/2014    Procedure: EXPLORATORY LAPAROTOMY;  Surgeon: Violeta Gelinas, MD;  Location: [facility] OR;  Service: General;  Laterality: N/A;  . Repair of perforated ulcer N/A 05/07/2014    Procedure: Velna Hatchet DUODENAL ULCER;  Surgeon: Violeta Gelinas, MD;  Location: [facility] OR;  Service: General;  Laterality: N/A;    No family history on file. Social History  Substance Use Topics  . Smoking status: Current Every Day Smoker  . Smokeless tobacco: None  . Alcohol Use: Yes     Comment: rare occas   OB History    No data available     Review of Systems  Constitutional: Positive for chills. Negative for fever.  Respiratory: Negative for chest tightness and shortness of breath.   Cardiovascular: Negative for chest pain.  Gastrointestinal: Positive for nausea, abdominal pain and diarrhea. Negative for vomiting.  Genitourinary: Negative for dysuria.  Musculoskeletal: Negative for back pain.  Neurological: Negative for headaches.  All other systems reviewed and are negative.     Allergies  Gabapentin; Opana; and Zofran  Home Medications   Prior to Admission medications   Medication Sig Start Date End Date Taking? Authorizing Provider  Acetaminophen 500 MG coapsule Take by mouth every 6 (six) hours as needed for fever.    Historical Provider, MD  albuterol (PROVENTIL HFA;VENTOLIN HFA) 108 (90 BASE) MCG/ACT inhaler Inhale 2 puffs into the lungs every 6 (six) hours as needed for wheezing or shortness of breath.    Historical Provider, MD  amitriptyline (ELAVIL) 50 MG tablet TAKE 1 TABLET (50 MG TOTAL) BY MOUTH AT BEDTIME. 12/24/14   Babs Sciara, MD  cefPROZIL (CEFZIL) 500 MG tablet Take 1 tablet (500 mg total) by mouth 2 (two) times daily. 11/11/14   Babs Sciara, MD  clonazePAM (KLONOPIN) 1 MG tablet TAKE 1/2 TO 1 TABLET BY  MOUTH 3 TIMES A DAY AS NEEDED FOR PANIC ATTACKS 10/28/14   Babs Sciara, MD  fluconazole (DIFLUCAN) 150 MG tablet Take 1 tablet (150 mg total) by mouth once. 11/11/14   Babs Sciara, MD  hydrochlorothiazide (HYDRODIURIL) 25 MG tablet TAKE ONE-HALF TO ONE TABLET BY MOUTH EVERY MORNING AS NEEDED FOR SWELLING 07/30/14   Merlyn Albert, MD  KLOR-CON 10 10 MEQ tablet TAKE 1 TABLET BY MOUTH DAILY WHEN TAKING HCTZ 11/11/14   Babs Sciara, MD  levothyroxine (SYNTHROID,  LEVOTHROID) 50 MCG tablet TAKE 1 TABLET EVERY DAY 12/25/14   Babs Sciara, MD  oxyCODONE (ROXICODONE) 15 MG immediate release tablet TAKE 1 TABLET BY MOUTH EVERY 4 TO 6 HOURS AS NEEDED FOR PAIN 11/07/14   Historical Provider, MD  pantoprazole (PROTONIX) 40 MG tablet Take 1 tablet (40 mg total) by mouth 2 (two) times daily. 11/11/14   Babs Sciara, MD  pantoprazole (PROTONIX) 40 MG tablet TAKE 1 TABLET (40 MG TOTAL) BY MOUTH 2 (TWO) TIMES DAILY. 12/12/14   Babs Sciara, MD  promethazine (PHENERGAN) 25 MG tablet Take 1 tablet (25 mg total) by mouth every 6 (six) hours as needed for nausea or vomiting. 01/12/15   Shon Baton, MD  sucralfate (CARAFATE) 1 GM/10ML suspension TAKE 10 MLS (1 G TOTAL) BY MOUTH 4 (FOUR) TIMES DAILY - WITH MEALS AND AT BEDTIME. 11/11/14   Babs Sciara, MD  triamcinolone cream (KENALOG) 0.1 % Apply 1 application topically 2 (two) times daily. 11/11/14   Babs Sciara, MD  venlafaxine XR (EFFEXOR-XR) 75 MG 24 hr capsule TAKE 2 CAPSULES BY MOUTH EVERY DAY WITH BREAKFAST 07/23/14   Babs Sciara, MD   BP 102/85 mmHg  Pulse 97  Temp(Src) 98.3 F (36.8 C) (Oral)  Resp 16  Ht  (1.6 m)  Wt 174 lb 5 oz (79.068 kg)  BMI 30.89 kg/m2  SpO2 97%  LMP 12/31/2014   Physical Exam  Constitutional: She is oriented to person, place, and time. She appears well-developed and well-nourished. No distress.  Overweight  HENT:  Head: Normocephalic and atraumatic.  Cardiovascular: Normal rate, regular rhythm and normal heart sounds.   No murmur heard. Pulmonary/Chest: Effort normal and breath sounds normal. No respiratory distress. She has no wheezes.  Abdominal: Soft. Bowel sounds are normal. There is no rebound and no guarding.  Midline ventral scarring, mild tenderness as well as fullness noted just left of midline  Neurological: She is alert and oriented to person, place, and time.  Skin: Skin is warm and dry.  Psychiatric: She has a normal mood and affect.  Nursing  note and vitals reviewed.   ED Course  Procedures (including critical care time) DIAGNOSTIC STUDIES: Oxygen Saturation is 97% on RA, normal by my interpretation.    COORDINATION OF CARE: 12:56 AM-Discussed treatment plan with pt at bedside and pt agreed to plan.     Labs Review Labs Reviewed  COMPREHENSIVE METABOLIC PANEL - Abnormal; Notable for the following:    Sodium 134 (*)    Chloride 99 (*)    Glucose, Bld 127 (*)    All other components within normal limits  URINALYSIS, ROUTINE W REFLEX MICROSCOPIC (NOT AT [facility]) - Abnormal; Notable for the following:    Color, Urine STRAW (*)    Hgb urine dipstick TRACE (*)    All other components within normal limits  URINE MICROSCOPIC-ADD ON - Abnormal; Notable for the following:    Squamous Epithelial /  LPF FEW (*)    Bacteria, UA FEW (*)    All other components within normal limits  I-STAT CG4 LACTIC ACID, ED - Abnormal; Notable for the following:    Lactic Acid, Venous 2.22 (*)    All other components within normal limits  LIPASE, BLOOD  CBC  POC URINE PREG, ED  I-STAT CG4 LACTIC ACID, ED    Imaging Review Ct Abdomen Pelvis W Contrast  01/12/2015  CLINICAL DATA:  Generalized abdominal pain.  Pain at hernia site EXAM: CT ABDOMEN AND PELVIS WITH CONTRAST TECHNIQUE: Multidetector CT imaging of the abdomen and pelvis was performed using the standard protocol following bolus administration of intravenous contrast. CONTRAST:  100mL OMNIPAQUE IOHEXOL 300 MG/ML  SOLN COMPARISON:  05/17/2014 FINDINGS: Lower chest and abdominal wall: Supraumbilical midline incisional hernia with 3 discrete components all containing non infiltrated fat. No bowel herniation. The hernia has developed since comparison Hepatobiliary: No focal liver abnormality.No gallstone. Prominent but stable common bile duct diameter measuring 6 mm. Pancreas: Unremarkable. Spleen: Unremarkable. Adrenals/Urinary Tract: Negative adrenals. No hydronephrosis or stone. Unremarkable  bladder. Reproductive:No acute finding.  Symmetric ovarian volume. Stomach/Bowel:  No obstruction. No appendicitis. Vascular/Lymphatic: No acute vascular abnormality. No mass or adenopathy. Peritoneal: No ascites or pneumoperitoneum. Musculoskeletal: No acute abnormalities. Bilateral L5 pars defects with L4-5 and L5-S1 disc narrowing and grade 1 anterolisthesis. IMPRESSION: 1. No acute finding. 2. 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 3. 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 Electronically Signed   By: Marnee SpringJonathon  Watts M.D.   On: 01/12/2015 02:47      EKG Interpretation None      MDM   Final diagnoses:  Incisional hernia, without obstruction or gangrene  Generalized abdominal pain    Patient presents with abdominal pain, nausea, diarrhea, and a "knot in the stomach." Nontoxic on exam. Vital signs are reassuring. Only mild tenderness on exam with possible incisional hernia. Patient was treated symptomatically. Lab work obtained and largely reassuring. No evidence of significant dehydration. Discuss results with patient. She is very concerned about the possibility of a hernia and that her symptoms may be obstructive. Clinically, have low suspicion for obstruction; however, will obtain CT. CT does show midline incisional hernias that have developed.  Discussed the results with patient. Patient to follow-up outpatient with Dr. Janee Mornhompson. She is followed in chronic pain management. Patient will be given Phenergan for nausea.  After history, exam, and medical workup I feel the patient has been appropriately medically screened and is safe for discharge home. Pertinent diagnoses were discussed with the patient. Patient was given return precautions.  I personally performed the services described in this documentation, which was scribed in my presence. The recorded information has been reviewed and is accurate.      Shon Batonourtney F Horton, MD 01/12/15  [id number]

## 2015-01-12 NOTE — ED Notes (Signed)
Dr. Wilkie AyeHorton back in to speak with pt.

## 2015-01-12 NOTE — Discharge Instructions (Signed)
Hernia, Adult A hernia is the bulging of an organ or tissue through a weak spot in the muscles of the abdomen (abdominal wall). Hernias develop most often near the navel or groin. There are many kinds of hernias. Common kinds include:  Femoral hernia. This kind of hernia develops under the groin in the upper thigh area.  Inguinal hernia. This kind of hernia develops in the groin or scrotum.  Umbilical hernia. This kind of hernia develops near the navel.  Hiatal hernia. This kind of hernia causes part of the stomach to be pushed up into the chest.  Incisional hernia. This kind of hernia bulges through a scar from an abdominal surgery. CAUSES This condition may be caused by:  Heavy lifting.  Coughing over a long period of time.  Straining to have a bowel movement.  An incision made during an abdominal surgery.  A birth defect (congenital defect).  Excess weight or obesity.  Smoking.  Poor nutrition.  Cystic fibrosis.  Excess fluid in the abdomen.  Undescended testicles. SYMPTOMS Symptoms of a hernia include:  A lump on the abdomen. This is the first sign of a hernia. The lump may become more obvious with standing, straining, or coughing. It may get bigger over time if it is not treated or if the condition causing it is not treated.  Pain. A hernia is usually painless, but it may become painful over time if treatment is delayed. The pain is usually dull and may get worse with standing or lifting heavy objects. Sometimes a hernia gets tightly squeezed in the weak spot (strangulated) or stuck there (incarcerated) and causes additional symptoms. These symptoms may include:  Vomiting.  Nausea.  Constipation.  Irritability. DIAGNOSIS A hernia may be diagnosed with:  A physical exam. During the exam your health care provider may ask you to cough or to make a specific movement, because a hernia is usually more visible when you move.  Imaging tests. These can  include:  X-rays.  Ultrasound.  CT scan. TREATMENT A hernia that is small and painless may not need to be treated. A hernia that is large or painful may be treated with surgery. Inguinal hernias may be treated with surgery to prevent incarceration or strangulation. Strangulated hernias are always treated with surgery, because lack of blood to the trapped organ or tissue can cause it to die. Surgery to treat a hernia involves pushing the bulge back into place and repairing the weak part of the abdomen. HOME CARE INSTRUCTIONS  Avoid straining.  Do not lift anything heavier than 10 lb (4.5 kg).  Lift with your leg muscles, not your back muscles. This helps avoid strain.  When coughing, try to cough gently.  Prevent constipation. Constipation leads to straining with bowel movements, which can make a hernia worse or cause a hernia repair to break down. You can prevent constipation by:  Eating a high-fiber diet that includes plenty of fruits and vegetables.  Drinking enough fluids to keep your urine clear or pale yellow. Aim to drink 6-8 glasses of water per day.  Using a stool softener as directed by your health care provider.  Lose weight, if you are overweight.  Do not use any tobacco products, including cigarettes, chewing tobacco, or electronic cigarettes. If you need help quitting, ask your health care provider.  Keep all follow-up visits as directed by your health care provider. This is important. Your health care provider may need to monitor your condition. SEEK MEDICAL CARE IF:  You have   swelling, redness, and pain in the affected area.  Your bowel habits change. SEEK IMMEDIATE MEDICAL CARE IF:  You have a fever.  You have abdominal pain that is getting worse.  You feel nauseous or you vomit.  You cannot push the hernia back in place by gently pressing on it while you are lying down.  The hernia:  Changes in shape or size.  Is stuck outside the  abdomen.  Becomes discolored.  Feels hard or tender.   This information is not intended to replace advice given to you by your health care provider. Make sure you discuss any questions you have with your health care provider.   Document Released: 02/15/2005 Document Revised: 03/08/2014 Document Reviewed: 12/26/2013 Elsevier Interactive Patient Education 2016 [employer] Inc[employer]  

## 2015-01-12 NOTE — ED Notes (Signed)
Sprite given to patient.

## 2015-01-17 ENCOUNTER — Other Ambulatory Visit: Payer: Self-pay | Admitting: Family Medicine

## 2015-01-20 ENCOUNTER — Encounter: Payer: Self-pay | Admitting: Family Medicine

## 2015-01-22 ENCOUNTER — Other Ambulatory Visit: Payer: Self-pay | Admitting: Family Medicine

## 2015-01-28 ENCOUNTER — Encounter (HOSPITAL_COMMUNITY): Payer: Self-pay | Admitting: Emergency Medicine

## 2015-01-28 ENCOUNTER — Emergency Department (HOSPITAL_COMMUNITY)
Admission: EM | Admit: 2015-01-28 | Discharge: 2015-01-28 | Disposition: A | Discharge status: Home / Self Care | Payer: Medicaid Other | Source: Home / Self Care | Attending: Emergency Medicine | Admitting: Emergency Medicine

## 2015-01-28 DIAGNOSIS — J018 Other acute sinusitis: Secondary | ICD-10-CM | POA: Diagnosis not present

## 2015-01-28 HISTORY — DX: Anxiety disorder, unspecified: F41.9

## 2015-01-28 HISTORY — DX: Essential (primary) hypertension: I10

## 2015-01-28 HISTORY — DX: Depression, unspecified: F32.A

## 2015-01-28 HISTORY — DX: Major depressive disorder, single episode, unspecified: F32.9

## 2015-01-28 LAB — POCT RAPID STREP A: Streptococcus, Group A Screen (Direct): NEGATIVE

## 2015-01-28 MED ORDER — FLUTICASONE PROPIONATE 50 MCG/ACT NA SUSP: 2.0000 | Freq: Every day | NASAL | Status: DC

## 2015-01-28 MED ORDER — LIDOCAINE HCL (PF) 1 % IJ SOLN
INTRAMUSCULAR | Status: AC
  Filled 2015-01-28: qty 5

## 2015-01-28 MED ORDER — CEFTRIAXONE SODIUM 250 MG IJ SOLR
INTRAMUSCULAR | Status: AC
  Filled 2015-01-28: qty 250

## 2015-01-28 MED ORDER — AMOXICILLIN-POT CLAVULANATE 875-125 MG PO TABS: 1.0000 | ORAL_TABLET | Freq: Two times a day (BID) | ORAL | Status: DC

## 2015-01-28 MED ORDER — CEFTRIAXONE SODIUM 1 G IJ SOLR
INTRAMUSCULAR | Status: AC
  Filled 2015-01-28: qty 10

## 2015-01-28 MED ORDER — CLONAZEPAM 1 MG PO TABS: 0.5000 mg | ORAL_TABLET | Freq: Two times a day (BID) | ORAL | Status: DC | PRN

## 2015-01-28 MED ORDER — CEFTRIAXONE SODIUM 1 G IJ SOLR
1.0000 g | Freq: Once | INTRAMUSCULAR | Status: AC
  Administered 2015-01-28: 1 g via INTRAMUSCULAR

## 2015-01-28 NOTE — ED Provider Notes (Signed)
CSN: [id number]     Arrival date & time 01/28/15  1552 History   First MD Initiated Contact with Patient 01/28/15 1634     Chief Complaint  Patient presents with  . URI   (Consider location/radiation/quality/duration/timing/severity/associated sxs/prior Treatment) HPI She is a 34 year old woman here for evaluation of sinus issues. She states for the last 3 weeks she has had nasal congestion, rhinorrhea, sinus pressure, and headaches. She states it would intermittently improve, but then worsened. She reports fevers at home. She describes nausea and vomiting. She has been taking Tylenol without improvement.  She has multiple other medical problems including duodenal ulcer, anxiety, back pain. She states she always gets a shot of Rocephin and then starts the antibiotics the next day. She is also requesting a refill of her anxiety medication if possible. [place], and she has been unable to get up there due to financial reasons. She takes Klonopin 0.5-1 mg 3 times a day as needed.  Past Medical History  Diagnosis Date  . Renal disorder   . Interstitial cystitis   . Thyroid disease   . Hypothyroidism   . Renal stones   . Back pain   . GI bleed   . Anxiety   . Depression   . Hypertension    Past Surgical History  Procedure Laterality Date  . Esophagogastroduodenoscopy N/A 04/27/2014    Procedure: ESOPHAGOGASTRODUODENOSCOPY (EGD);  Surgeon: Shirley FriarVincent C. Schooler, MD;  [facility];  Service: Endoscopy;  Laterality: N/A;  . Esophagogastroduodenoscopy (egd) with propofol N/A 05/03/2014    Procedure: ESOPHAGOGASTRODUODENOSCOPY (EGD) WITH PROPOFOL;  Surgeon: Shirley FriarVincent C. Schooler, MD;  Location: [facility] ENDOSCOPY;  Service: Endoscopy;  Laterality: N/A;  . Laparotomy N/A 05/07/2014    Procedure: EXPLORATORY LAPAROTOMY;  Surgeon: Violeta GelinasBurke Thompson, MD;  [facility];  Service: General;  Laterality: N/A;  . Repair of perforated ulcer N/A 05/07/2014    Procedure: Velna HatchetVERSOWE  DUODENAL ULCER;  Surgeon: Violeta GelinasBurke Thompson, MD;  [facility];  Service: General;  Laterality: N/A;   No family history on file. Social History  Substance Use Topics  . Smoking status: Current Every Day Smoker  . Smokeless tobacco: None  . Alcohol Use: Yes     Comment: rare occas   OB History    No data available     Review of Systems As in history of present illness Allergies  Gabapentin; Opana; and Zofran  Home Medications   Prior to Admission medications   Medication Sig Start Date End Date Taking? Authorizing Provider  Acetaminophen 500 MG coapsule Take by mouth every 6 (six) hours as needed for fever.    Historical Provider, MD  albuterol (PROVENTIL HFA;VENTOLIN HFA) 108 (90 BASE) MCG/ACT inhaler Inhale 2 puffs into the lungs every 6 (six) hours as needed for wheezing or shortness of breath.    Historical Provider, MD  amitriptyline (ELAVIL) 50 MG tablet TAKE 1 TABLET (50 MG TOTAL) BY MOUTH AT BEDTIME. 01/22/15   Babs SciaraScott A Luking, MD  amoxicillin-clavulanate (AUGMENTIN) 875-125 MG tablet Take 1 tablet by mouth 2 (two) times daily. 01/28/15   Charm RingsErin J Honig, MD  cefPROZIL (CEFZIL) 500 MG tablet Take 1 tablet (500 mg total) by mouth 2 (two) times daily. 11/11/14   Babs SciaraScott A Luking, MD  clonazePAM (KLONOPIN) 1 MG tablet TAKE 1/2 TO 1 TABLET BY MOUTH 3 TIMES A DAY AS NEEDED FOR PANIC ATTACKS 10/28/14   Babs SciaraScott A Luking, MD  clonazePAM (KLONOPIN) 1 MG tablet Take 0.5-1 tablets (  0.5-1 mg total) by mouth 2 (two) times daily as needed for anxiety. 01/28/15   Charm Rings, MD  fluconazole (DIFLUCAN) 150 MG tablet Take 1 tablet (150 mg total) by mouth once. 11/11/14   Babs Sciara, MD  fluticasone (FLONASE) 50 MCG/ACT nasal spray Place 2 sprays into both nostrils daily. 01/28/15   Charm Rings, MD  hydrochlorothiazide (HYDRODIURIL) 25 MG tablet TAKE ONE-HALF TO ONE TABLET BY MOUTH EVERY MORNING AS NEEDED FOR SWELLING 07/30/14   Merlyn Albert, MD  KLOR-CON 10 10 MEQ tablet TAKE 1 TABLET BY MOUTH  DAILY WHEN TAKING HCTZ 11/11/14   Babs Sciara, MD  levothyroxine (SYNTHROID, LEVOTHROID) 50 MCG tablet TAKE 1 TABLET EVERY DAY 01/22/15   Babs Sciara, MD  oxyCODONE (ROXICODONE) 15 MG immediate release tablet TAKE 1 TABLET BY MOUTH EVERY 4 TO 6 HOURS AS NEEDED FOR PAIN 11/07/14   Historical Provider, MD  pantoprazole (PROTONIX) 40 MG tablet Take 1 tablet (40 mg total) by mouth 2 (two) times daily. 11/11/14   Babs Sciara, MD  pantoprazole (PROTONIX) 40 MG tablet TAKE 1 TABLET (40 MG TOTAL) BY MOUTH 2 (TWO) TIMES DAILY. 12/12/14   Babs Sciara, MD  promethazine (PHENERGAN) 25 MG tablet Take 1 tablet (25 mg total) by mouth every 6 (six) hours as needed for nausea or vomiting. 01/12/15   Shon Baton, MD  sucralfate (CARAFATE) 1 GM/10ML suspension TAKE 10 MLS (1 G TOTAL) BY MOUTH 4 (FOUR) TIMES DAILY - WITH MEALS AND AT BEDTIME. 11/11/14   Babs Sciara, MD  triamcinolone cream (KENALOG) 0.1 % Apply 1 application topically 2 (two) times daily. 11/11/14   Babs Sciara, MD  venlafaxine XR (EFFEXOR-XR) 75 MG 24 hr capsule TAKE 2 CAPSULES BY MOUTH EVERY DAY WITH BREAKFAST 01/17/15   Babs Sciara, MD   Meds Ordered and Administered this Visit   Medications  cefTRIAXone (ROCEPHIN) injection 1 g (not administered)    BP 121/87 mmHg  Pulse 92  Temp(Src) 98.2 F (36.8 C) (Oral)  Resp 18  SpO2 100%  LMP 12/31/2014 No data found.   Physical Exam  Constitutional: She is oriented to person, place, and time. She appears well-developed and well-nourished. No distress.  HENT:  Mouth/Throat: Oropharynx is clear and moist. No oropharyngeal exudate.  Nasal mucosa is erythematous. TMs with clear effusions.  Eyes: Conjunctivae are normal.  Neck: Neck supple.  Cardiovascular: Normal rate, regular rhythm and normal heart sounds.   No murmur heard. Pulmonary/Chest: Effort normal and breath sounds normal. No respiratory distress. She has no wheezes. She has no rales.  Lymphadenopathy:    She  has cervical adenopathy.  Neurological: She is alert and oriented to person, place, and time.    ED Course  Procedures (including critical care time)  Labs Review Labs Reviewed  POCT RAPID STREP A    Imaging Review No results found.     MDM   1. Other acute sinusitis    After discussing with patient that Rocephin does not really provide great coverage for sinusitis, patient still requests a shot. We'll give Rocephin 1 g IM here. Prescriptions given for Augmentin and Flonase. I provided 15 tablets of her Klonopin.    Charm Rings, MD 01/28/15 [phone]

## 2015-01-28 NOTE — Discharge Instructions (Signed)
You have a sinus infection. Take Augmentin twice a day for the next 10 days. Use Flonase daily for the next 10 days. Make sure you're drinking plenty of fluids. Your rapid strep test is negative today. We will call you if the culture comes back positive. Follow-up as needed.

## 2015-01-28 NOTE — ED Notes (Signed)
C/o bad sinus infection, sore throat.  Reports symptoms for 3 weeks.  Complains of mask around eyes, puffy face around eyes and nose, facial pressure, nausea, feverish.

## 2015-01-31 LAB — CULTURE, GROUP A STREP: Strep A Culture: NEGATIVE

## 2015-01-31 NOTE — ED Notes (Signed)
Final report of strep testing negative  

## 2015-02-12 ENCOUNTER — Ambulatory Visit: Payer: Self-pay | Admitting: General Surgery

## 2015-02-19 ENCOUNTER — Encounter (HOSPITAL_COMMUNITY): Payer: Self-pay

## 2015-02-19 ENCOUNTER — Encounter (HOSPITAL_COMMUNITY)
Admission: RE | Admit: 2015-02-19 | Discharge: 2015-02-19 | Disposition: A | Discharge status: Home / Self Care | Payer: Medicaid Other | Source: Ambulatory Visit | Attending: General Surgery | Admitting: General Surgery

## 2015-02-19 HISTORY — DX: Gastro-esophageal reflux disease without esophagitis: K21.9

## 2015-02-19 HISTORY — DX: Personal history of urinary calculi: Z87.442

## 2015-02-19 LAB — BASIC METABOLIC PANEL
Anion gap: 6 (ref 5–15)
BUN: 8 mg/dL (ref 6–20)
CO2: 25 mmol/L (ref 22–32)
Calcium: 9.2 mg/dL (ref 8.9–10.3)
Chloride: 109 mmol/L (ref 101–111)
Creatinine, Ser: 0.68 mg/dL (ref 0.44–1.00)
GFR calc Af Amer: >60 mL/min (ref >60)
GFR calc non Af Amer: >60 mL/min (ref >60)
Glucose, Bld: 89 mg/dL (ref 65–99)
Potassium: 4.4 mmol/L (ref 3.5–5.1)
Sodium: 140 mmol/L (ref 135–145)

## 2015-02-19 LAB — CBC
HCT: 41.9 % (ref 36.0–46.0)
Hemoglobin: 14 g/dL (ref 12.0–15.0)
MCH: 30.1 pg (ref 26.0–34.0)
MCHC: 33.4 g/dL (ref 30.0–36.0)
MCV: 90.1 fL (ref 78.0–100.0)
Platelets: 280 10*3/uL (ref 150–400)
RBC: 4.65 MIL/uL (ref 3.87–5.11)
RDW: 14.8 % (ref 11.5–15.5)
WBC: 9 10*3/uL (ref 4.0–10.5)

## 2015-02-19 LAB — HCG, SERUM, QUALITATIVE: Preg, Serum: NEGATIVE

## 2015-02-19 MED ORDER — CHLORHEXIDINE GLUCONATE 4 % EX LIQD: 1.0000 "application " | Freq: Once | CUTANEOUS | Status: DC

## 2015-02-19 MED ORDER — CEFAZOLIN SODIUM-DEXTROSE 2-3 GM-% IV SOLR
2.0000 g | INTRAVENOUS | Status: AC
  Administered 2015-02-20: 2 g via INTRAVENOUS
  Filled 2015-02-19: qty 50

## 2015-02-19 NOTE — H&P (Signed)
History of Present Illness Laurell Josephs(Kitzia Camus E. Janee Mornhompson MD; 02/12/2015 10:43 AM) Patient words: reck.  The patient is a 34 year old female who presents with an incisional hernia. Natalia LeatherwoodKatherine is well known to me status post emergent exploratory laparotomy for oversew bleeding duodenal ulcer in March 2016. She's had some abdominal pain and swelling along her incision. She also has chronic back pain. The abdominal pains, come and go but seem worse after eating. She has noticed a mass intermittently along the left side of her incision. She underwent evaluation in the emergency department on 01/12/2015. At that time she underwent CT scan of the abdomen and pelvis with findings as below. IMPRESSION: 1. No acute finding. 2. 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 3. 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  Dr. Lilyan PuntScott Luking asked me to see her in consultation for consideration of repair of her incisional hernias. She occasionally has nausea but has had no vomiting. Bowel movements have been normal.   Other Problems (Sonya Bynum, CMA; 02/12/2015 10:17 AM) Anxiety Disorder Back Pain Bladder Problems Depression Gastric Ulcer Gastroesophageal Reflux Disease Kidney Stone Other disease, cancer, significant illness Thyroid Disease  Past Surgical History Gilmer Mor(Sonya Bynum, CMA; 02/12/2015 10:17 AM) Cesarean Section - 1 Resection of Stomach Tonsillectomy  Diagnostic Studies History Gilmer Mor(Sonya Bynum, CMA; 02/12/2015 10:17 AM) Colonoscopy never Mammogram >3 years ago Pap Smear 1-5 years ago  Allergies Gilmer Mor(Sonya Bynum, CMA; 02/12/2015 10:19 AM) Gabapentin *ANTICONVULSANTS* Opana *ANALGESICS - OPIOID* Zofran *ANTIEMETICS*  Medication History (Sonya Bynum, CMA; 02/12/2015 10:20 AM) Amitriptyline HCl (50MG  Tablet, Oral) Active. ClonazePAM (1MG  Tablet, Oral) Active. OxyCODONE HCl (15MG  Tablet, Oral as needed) Active. Cyclobenzaprine HCl  (10MG  Tablet, Oral) Active. Klor-Con 10 (10MEQ Tablet ER, Oral) Active. Levothyroxine Sodium (50MCG Tablet, Oral) Active. Pantoprazole Sodium (40MG  Tablet DR, Oral) Active. Venlafaxine HCl ER (75MG  Capsule ER 24HR, Oral) Active. Promethazine HCl (25MG  Tablet, Oral) Active. Medications Reconciled  Social History Gilmer Mor(Sonya Bynum, CMA; 02/12/2015 10:17 AM) Alcohol use Occasional alcohol use. Caffeine use Carbonated beverages. No drug use Tobacco use Current every day smoker.  Family History Gilmer Mor(Sonya Bynum, CMA; 02/12/2015 10:17 AM) Alcohol Abuse Brother, Father. Arthritis Father, Mother. Breast Cancer Sister. Cerebrovascular Accident Brother. Depression Mother, Sister. Heart Disease Father, Mother. Heart disease in female family member before age [id number] Heart disease in female family member before age 35 Hypertension Father, Mother. Migraine Headache Brother, Mother, Sister. Seizure disorder Brother. Thyroid problems Sister.  Pregnancy / Birth History Gilmer Mor(Sonya Bynum, CMA; 02/12/2015 10:17 AM) Age at menarche 10 years. Contraceptive History Depo-provera, Oral contraceptives. Gravida 1 Maternal age 35-25 Para 1 Regular periods    Review of Systems Lamar Laundry(Sonya Bynum CMA; 02/12/2015 10:17 AM) General Present- Appetite Loss, Fatigue and Weight Gain. Not Present- Chills, Fever, Night Sweats and Weight Loss. Skin Not Present- Change in Wart/Mole, Dryness, Hives, Jaundice, New Lesions, Non-Healing Wounds, Rash and Ulcer. HEENT Present- Seasonal Allergies, Sinus Pain and Wears glasses/contact lenses. Not Present- Earache, Hearing Loss, Hoarseness, Nose Bleed, Oral Ulcers, Ringing in the Ears, Sore Throat, Visual Disturbances and Yellow Eyes. Respiratory Not Present- Bloody sputum, Chronic Cough, Difficulty Breathing, Snoring and Wheezing. Breast Not Present- Breast Mass, Breast Pain, Nipple Discharge and Skin Changes. Cardiovascular Present- Rapid Heart Rate. Not Present-  Chest Pain, Difficulty Breathing Lying Down, Leg Cramps, Palpitations, Shortness of Breath and Swelling of Extremities. Gastrointestinal Present- Abdominal Pain, Bloating, Change in Bowel Habits, Constipation, Gets full quickly at meals, Indigestion, Nausea and Vomiting. Not Present- Bloody Stool, Chronic diarrhea, Difficulty Swallowing, Excessive gas, Hemorrhoids  and Rectal Pain. Female Genitourinary Present- Frequency. Not Present- Nocturia, Painful Urination, Pelvic Pain and Urgency. Musculoskeletal Present- Back Pain, Joint Stiffness and Muscle Pain. Not Present- Joint Pain, Muscle Weakness and Swelling of Extremities. Neurological Present- Headaches, Numbness, Tingling, Trouble walking and Weakness. Not Present- Decreased Memory, Fainting, Seizures and Tremor. Psychiatric Present- Anxiety and Depression. Not Present- Bipolar, Change in Sleep Pattern, Fearful and Frequent crying. Endocrine Not Present- Cold Intolerance, Excessive Hunger, Hair Changes, Heat Intolerance, Hot flashes and New Diabetes. Hematology Not Present- Easy Bruising, Excessive bleeding, Gland problems, HIV and Persistent Infections.  Vitals (Sonya Bynum CMA; 02/12/2015 10:18 AM) 02/12/2015 10:18 AM Weight: 169 lb Height: 63in Body Surface Area: 1.8 m Body Mass Index: 29.94 kg/m  Temp.: 97.4F(Temporal)  Pulse: 76 (Regular)  BP: 124/74 (Sitting, Left Arm, Standard)       Physical Exam Laurell Josephs E. Janee Morn MD; 02/12/2015 10:45 AM) General Mental Status-Alert. General Appearance-Consistent with stated age. Hydration-Well hydrated. Voice-Normal.  Head and Neck Head-normocephalic, atraumatic with no lesions or palpable masses. Trachea-midline. Thyroid Gland Characteristics - normal size and consistency.  Eye Eyeball - Bilateral-Extraocular movements intact. Sclera/Conjunctiva - Bilateral-No scleral icterus.  Chest and Lung Exam Chest and lung exam reveals -quiet, even and easy  respiratory effort with no use of accessory muscles and on auscultation, normal breath sounds, no adventitious sounds and normal vocal resonance. Inspection Chest Wall - Normal. Back - normal.  Cardiovascular Cardiovascular examination reveals -normal heart sounds, regular rate and rhythm with no murmurs and normal pedal pulses bilaterally.  Abdomen Inspection Hernias - Incisional - Reducible(Reducible incisional hernias along the mid to lower portion of her old midline incision, mild tenderness, fascial defect of the largest seems to be about 3 cm). Palpation/Percussion Palpation and Percussion of the abdomen reveal - Soft, Non Tender, No Rebound tenderness, No Rigidity (guarding) and No hepatosplenomegaly. Auscultation Auscultation of the abdomen reveals - Bowel sounds normal.  Neurologic Neurologic evaluation reveals -alert and oriented x 3 with no impairment of recent or remote memory. Mental Status-Normal.  Neuropsychiatric Note: Anxious   Musculoskeletal Global Assessment -Note: no gross deformities.  Normal Exam - Left-Upper Extremity Strength Normal and Lower Extremity Strength Normal. Normal Exam - Right-Upper Extremity Strength Normal and Lower Extremity Strength Normal.  Lymphatic Head & Neck  General Head & Neck Lymphatics: Bilateral - Description - Normal. Axillary  General Axillary Region: Bilateral - Description - Normal. Tenderness - Non Tender. Femoral & Inguinal  Generalized Femoral & Inguinal Lymphatics: Bilateral - Description - No Generalized lymphadenopathy.    Assessment & Plan Laurell Josephs E. Janee Morn MD; 02/12/2015 10:46 AM) Sherald Hess HERNIA, WITHOUT OBSTRUCTION OR GANGRENE (K43.2) Impression: These hernias only contained fat but are symptomatic. I have offered laparoscopic repair with mesh. Procedure, risks, and benefits were discussed in detail with her. She is agreeable and would like to schedule.  Violeta Gelinas, MD, MPH,  FACS Trauma: [phone] General Surgery: [phone]

## 2015-02-20 ENCOUNTER — Ambulatory Visit (HOSPITAL_COMMUNITY): Payer: Medicaid Other | Admitting: Anesthesiology

## 2015-02-20 ENCOUNTER — Encounter (HOSPITAL_COMMUNITY): Payer: Self-pay | Admitting: *Deleted

## 2015-02-20 ENCOUNTER — Encounter (HOSPITAL_COMMUNITY)
Admission: RE | Disposition: A | Discharge status: Home / Self Care | Payer: Self-pay | Source: Ambulatory Visit | Attending: General Surgery

## 2015-02-20 ENCOUNTER — Inpatient Hospital Stay (HOSPITAL_COMMUNITY)
Admission: RE | Admit: 2015-02-20 | Discharge: 2015-02-22 | DRG: 328 | Disposition: A | Discharge status: Home / Self Care | Payer: Medicaid Other | Source: Ambulatory Visit | Attending: General Surgery | Admitting: General Surgery

## 2015-02-20 DIAGNOSIS — F329 Major depressive disorder, single episode, unspecified: Secondary | ICD-10-CM | POA: Diagnosis not present

## 2015-02-20 DIAGNOSIS — Z79899 Other long term (current) drug therapy: Secondary | ICD-10-CM

## 2015-02-20 DIAGNOSIS — F419 Anxiety disorder, unspecified: Secondary | ICD-10-CM | POA: Diagnosis not present

## 2015-02-20 DIAGNOSIS — Z888 Allergy status to other drugs, medicaments and biological substances status: Secondary | ICD-10-CM | POA: Diagnosis not present

## 2015-02-20 DIAGNOSIS — K432 Incisional hernia without obstruction or gangrene: Secondary | ICD-10-CM | POA: Diagnosis not present

## 2015-02-20 DIAGNOSIS — Z8719 Personal history of other diseases of the digestive system: Secondary | ICD-10-CM

## 2015-02-20 DIAGNOSIS — K219 Gastro-esophageal reflux disease without esophagitis: Secondary | ICD-10-CM | POA: Diagnosis present

## 2015-02-20 DIAGNOSIS — Z9889 Other specified postprocedural states: Secondary | ICD-10-CM

## 2015-02-20 DIAGNOSIS — Z885 Allergy status to narcotic agent status: Secondary | ICD-10-CM

## 2015-02-20 DIAGNOSIS — F17210 Nicotine dependence, cigarettes, uncomplicated: Secondary | ICD-10-CM | POA: Diagnosis present

## 2015-02-20 DIAGNOSIS — Z8249 Family history of ischemic heart disease and other diseases of the circulatory system: Secondary | ICD-10-CM

## 2015-02-20 DIAGNOSIS — K660 Peritoneal adhesions (postprocedural) (postinfection): Secondary | ICD-10-CM | POA: Diagnosis not present

## 2015-02-20 HISTORY — DX: Headache, unspecified: R51.9

## 2015-02-20 HISTORY — DX: Pneumonia, unspecified organism: J18.9

## 2015-02-20 HISTORY — DX: Personal history of other diseases of the digestive system: Z87.19

## 2015-02-20 HISTORY — DX: Anemia, unspecified: D64.9

## 2015-02-20 HISTORY — PX: INSERTION OF MESH: SHX5868

## 2015-02-20 HISTORY — DX: Headache: R51

## 2015-02-20 HISTORY — DX: Low back pain: M54.5

## 2015-02-20 HISTORY — DX: Personal history of other medical treatment: Z92.89

## 2015-02-20 HISTORY — DX: Low back pain, unspecified: M54.50

## 2015-02-20 HISTORY — DX: Other chronic pain: G89.29

## 2015-02-20 HISTORY — PX: LAPAROSCOPIC INCISIONAL / UMBILICAL / VENTRAL HERNIA REPAIR: SUR789

## 2015-02-20 HISTORY — PX: INCISIONAL HERNIA REPAIR: SHX193

## 2015-02-20 LAB — CBC
HCT: 40.3 % (ref 36.0–46.0)
Hemoglobin: 13 g/dL (ref 12.0–15.0)
MCH: 29.3 pg (ref 26.0–34.0)
MCHC: 32.3 g/dL (ref 30.0–36.0)
MCV: 91 fL (ref 78.0–100.0)
Platelets: 274 10*3/uL (ref 150–400)
RBC: 4.43 MIL/uL (ref 3.87–5.11)
RDW: 14.9 % (ref 11.5–15.5)
WBC: 19.4 10*3/uL — ABNORMAL HIGH (ref 4.0–10.5)

## 2015-02-20 LAB — CREATININE, SERUM
Creatinine, Ser: 0.82 mg/dL (ref 0.44–1.00)
GFR calc Af Amer: >60 mL/min (ref >60)
GFR calc non Af Amer: >60 mL/min (ref >60)

## 2015-02-20 SURGERY — REPAIR, HERNIA, INCISIONAL, LAPAROSCOPIC
Anesthesia: General | Site: Abdomen

## 2015-02-20 MED ORDER — ZOLPIDEM TARTRATE 5 MG PO TABS
5.0000 mg | ORAL_TABLET | Freq: Every evening | ORAL | Status: DC | PRN
Start: 2015-02-20 — End: 2015-02-22
  Administered 2015-02-21: 5 mg via ORAL
  Filled 2015-02-20: qty 1

## 2015-02-20 MED ORDER — MIDAZOLAM HCL 5 MG/5ML IJ SOLN
INTRAMUSCULAR | Status: DC | PRN
  Administered 2015-02-20: 2 mg via INTRAVENOUS

## 2015-02-20 MED ORDER — SUCRALFATE 1 GM/10ML PO SUSP
1.0000 g | Freq: Three times a day (TID) | ORAL | Status: DC
  Administered 2015-02-20 – 2015-02-22 (×7): 1 g via ORAL
  Filled 2015-02-20 (×7): qty 10

## 2015-02-20 MED ORDER — NEOSTIGMINE METHYLSULFATE 10 MG/10ML IV SOLN
INTRAVENOUS | Status: DC | PRN
  Administered 2015-02-20: 3 mg via INTRAVENOUS

## 2015-02-20 MED ORDER — ROCURONIUM BROMIDE 50 MG/5ML IV SOLN
INTRAVENOUS | Status: AC
  Filled 2015-02-20: qty 1

## 2015-02-20 MED ORDER — METOCLOPRAMIDE HCL 5 MG/ML IJ SOLN
INTRAMUSCULAR | Status: AC
  Filled 2015-02-20: qty 2

## 2015-02-20 MED ORDER — FENTANYL CITRATE (PF) 100 MCG/2ML IJ SOLN
INTRAMUSCULAR | Status: AC
  Administered 2015-02-20: 50 ug via INTRAVENOUS
  Filled 2015-02-20: qty 2

## 2015-02-20 MED ORDER — GLYCOPYRROLATE 0.2 MG/ML IJ SOLN
INTRAMUSCULAR | Status: DC | PRN
  Administered 2015-02-20: .4 mg via INTRAVENOUS

## 2015-02-20 MED ORDER — HYDROMORPHONE HCL 1 MG/ML IJ SOLN
INTRAMUSCULAR | Status: DC | PRN
  Administered 2015-02-20: 1 mg via INTRAVENOUS

## 2015-02-20 MED ORDER — PROMETHAZINE HCL 25 MG PO TABS: 25.0000 mg | ORAL_TABLET | Freq: Four times a day (QID) | ORAL | Status: DC | PRN

## 2015-02-20 MED ORDER — METHOCARBAMOL 750 MG PO TABS
750.0000 mg | ORAL_TABLET | Freq: Four times a day (QID) | ORAL | Status: DC | PRN
  Administered 2015-02-20 – 2015-02-22 (×4): 750 mg via ORAL
  Filled 2015-02-20 (×5): qty 1

## 2015-02-20 MED ORDER — PANTOPRAZOLE SODIUM 40 MG PO TBEC
40.0000 mg | DELAYED_RELEASE_TABLET | Freq: Two times a day (BID) | ORAL | Status: DC
  Administered 2015-02-20 – 2015-02-21 (×3): 40 mg via ORAL
  Filled 2015-02-20 (×3): qty 1

## 2015-02-20 MED ORDER — LEVOTHYROXINE SODIUM 50 MCG PO TABS
50.0000 ug | ORAL_TABLET | Freq: Every day | ORAL | Status: DC
  Administered 2015-02-21: 50 ug via ORAL
  Filled 2015-02-20: qty 1

## 2015-02-20 MED ORDER — VENLAFAXINE HCL ER 75 MG PO CP24
75.0000 mg | ORAL_CAPSULE | Freq: Every day | ORAL | Status: DC
  Administered 2015-02-21 – 2015-02-22 (×2): 75 mg via ORAL
  Filled 2015-02-20 (×2): qty 1

## 2015-02-20 MED ORDER — NICOTINE 14 MG/24HR TD PT24
14.0000 mg | MEDICATED_PATCH | Freq: Every day | TRANSDERMAL | Status: DC
  Administered 2015-02-20 – 2015-02-21 (×2): 14 mg via TRANSDERMAL
  Filled 2015-02-20 (×2): qty 1

## 2015-02-20 MED ORDER — LACTATED RINGERS IV SOLN
INTRAVENOUS | Status: DC
Start: 2015-02-20 — End: 2015-02-20
  Administered 2015-02-20: 12:00:00 via INTRAVENOUS

## 2015-02-20 MED ORDER — BUPIVACAINE-EPINEPHRINE (PF) 0.5% -1:200000 IJ SOLN
INTRAMUSCULAR | Status: DC | PRN
  Administered 2015-02-20: 30 mL via PERINEURAL

## 2015-02-20 MED ORDER — KCL IN DEXTROSE-NACL 20-5-0.45 MEQ/L-%-% IV SOLN
INTRAVENOUS | Status: DC
  Administered 2015-02-20: 22:00:00 via INTRAVENOUS
  Filled 2015-02-20: qty 1000

## 2015-02-20 MED ORDER — MIDAZOLAM HCL 2 MG/2ML IJ SOLN
INTRAMUSCULAR | Status: AC
  Filled 2015-02-20: qty 2

## 2015-02-20 MED ORDER — SODIUM CHLORIDE 0.9 % IV SOLN
10.0000 mg | INTRAVENOUS | Status: DC | PRN
  Administered 2015-02-20: 10 ug/min via INTRAVENOUS

## 2015-02-20 MED ORDER — FENTANYL CITRATE (PF) 100 MCG/2ML IJ SOLN
25.0000 ug | INTRAMUSCULAR | Status: DC | PRN
  Administered 2015-02-20 (×4): 50 ug via INTRAVENOUS

## 2015-02-20 MED ORDER — LIDOCAINE HCL (CARDIAC) 20 MG/ML IV SOLN
INTRAVENOUS | Status: DC | PRN
  Administered 2015-02-20: 100 mg via INTRAVENOUS

## 2015-02-20 MED ORDER — CLONAZEPAM 0.5 MG PO TABS
0.5000 mg | ORAL_TABLET | Freq: Two times a day (BID) | ORAL | Status: DC | PRN
  Administered 2015-02-20 – 2015-02-21 (×3): 1 mg via ORAL
  Filled 2015-02-20 (×3): qty 2

## 2015-02-20 MED ORDER — GLYCOPYRROLATE 0.2 MG/ML IJ SOLN
INTRAMUSCULAR | Status: AC
  Filled 2015-02-20: qty 3

## 2015-02-20 MED ORDER — ALBUTEROL SULFATE (2.5 MG/3ML) 0.083% IN NEBU: 2.5000 mg | INHALATION_SOLUTION | Freq: Four times a day (QID) | RESPIRATORY_TRACT | Status: DC | PRN

## 2015-02-20 MED ORDER — LIDOCAINE HCL (CARDIAC) 20 MG/ML IV SOLN
INTRAVENOUS | Status: AC
  Filled 2015-02-20: qty 5

## 2015-02-20 MED ORDER — SODIUM CHLORIDE 0.9 % IR SOLN
Status: DC | PRN
  Administered 2015-02-20: 1000 mL

## 2015-02-20 MED ORDER — ALBUTEROL SULFATE HFA 108 (90 BASE) MCG/ACT IN AERS: 2.0000 | INHALATION_SPRAY | Freq: Four times a day (QID) | RESPIRATORY_TRACT | Status: DC | PRN

## 2015-02-20 MED ORDER — ONDANSETRON HCL 4 MG/2ML IJ SOLN
INTRAMUSCULAR | Status: DC | PRN
  Administered 2015-02-20: 4 mg via INTRAVENOUS

## 2015-02-20 MED ORDER — GLYCOPYRROLATE 0.2 MG/ML IJ SOLN
INTRAMUSCULAR | Status: AC
  Filled 2015-02-20: qty 2

## 2015-02-20 MED ORDER — ROCURONIUM BROMIDE 100 MG/10ML IV SOLN
INTRAVENOUS | Status: DC | PRN
  Administered 2015-02-20: 10 mg via INTRAVENOUS
  Administered 2015-02-20: 50 mg via INTRAVENOUS

## 2015-02-20 MED ORDER — NEOSTIGMINE METHYLSULFATE 10 MG/10ML IV SOLN
INTRAVENOUS | Status: AC
  Filled 2015-02-20: qty 1

## 2015-02-20 MED ORDER — ACETAMINOPHEN 650 MG RE SUPP: 650.0000 mg | Freq: Four times a day (QID) | RECTAL | Status: DC | PRN

## 2015-02-20 MED ORDER — HYDROMORPHONE HCL 1 MG/ML IJ SOLN
1.0000 mg | INTRAMUSCULAR | Status: DC | PRN
  Administered 2015-02-20 – 2015-02-21 (×5): 2 mg via INTRAVENOUS
  Administered 2015-02-21: 1 mg via INTRAVENOUS
  Administered 2015-02-21: 2 mg via INTRAVENOUS
  Administered 2015-02-21: 1 mg via INTRAVENOUS
  Administered 2015-02-21 – 2015-02-22 (×5): 2 mg via INTRAVENOUS
  Filled 2015-02-20 (×11): qty 2
  Filled 2015-02-20: qty 1
  Filled 2015-02-20: qty 2

## 2015-02-20 MED ORDER — OXYCODONE HCL 5 MG PO TABS
5.0000 mg | ORAL_TABLET | ORAL | Status: DC | PRN
  Administered 2015-02-20 – 2015-02-21 (×4): 15 mg via ORAL
  Filled 2015-02-20 (×4): qty 3

## 2015-02-20 MED ORDER — 0.9 % SODIUM CHLORIDE (POUR BTL) OPTIME
TOPICAL | Status: DC | PRN
  Administered 2015-02-20: 1000 mL

## 2015-02-20 MED ORDER — METOCLOPRAMIDE HCL 5 MG/ML IJ SOLN
10.0000 mg | Freq: Once | INTRAMUSCULAR | Status: AC | PRN
  Administered 2015-02-20: 10 mg via INTRAVENOUS

## 2015-02-20 MED ORDER — PROPOFOL 10 MG/ML IV BOLUS
INTRAVENOUS | Status: AC
  Filled 2015-02-20: qty 20

## 2015-02-20 MED ORDER — FENTANYL CITRATE (PF) 250 MCG/5ML IJ SOLN
INTRAMUSCULAR | Status: AC
  Filled 2015-02-20: qty 5

## 2015-02-20 MED ORDER — ACETAMINOPHEN 325 MG PO TABS
650.0000 mg | ORAL_TABLET | Freq: Four times a day (QID) | ORAL | Status: DC | PRN
Start: 2015-02-20 — End: 2015-02-22
  Administered 2015-02-21 (×2): 650 mg via ORAL
  Filled 2015-02-20 (×2): qty 2

## 2015-02-20 MED ORDER — FENTANYL CITRATE (PF) 100 MCG/2ML IJ SOLN
INTRAMUSCULAR | Status: AC
  Administered 2015-02-20: 50 ug via INTRAVENOUS
  Filled 2015-02-20: qty 4

## 2015-02-20 MED ORDER — DEXAMETHASONE SODIUM PHOSPHATE 4 MG/ML IJ SOLN
INTRAMUSCULAR | Status: DC | PRN
  Administered 2015-02-20: 4 mg via INTRAVENOUS

## 2015-02-20 MED ORDER — HYDROCHLOROTHIAZIDE 25 MG PO TABS
25.0000 mg | ORAL_TABLET | Freq: Every day | ORAL | Status: DC
  Administered 2015-02-21: 25 mg via ORAL
  Filled 2015-02-20: qty 1

## 2015-02-20 MED ORDER — ENOXAPARIN SODIUM 40 MG/0.4ML ~~LOC~~ SOLN
40.0000 mg | SUBCUTANEOUS | Status: DC
  Filled 2015-02-20: qty 0.4

## 2015-02-20 MED ORDER — POTASSIUM CHLORIDE ER 10 MEQ PO TBCR
10.0000 meq | EXTENDED_RELEASE_TABLET | Freq: Every day | ORAL | Status: DC
  Administered 2015-02-21: 10 meq via ORAL
  Filled 2015-02-20 (×4): qty 1

## 2015-02-20 MED ORDER — FENTANYL CITRATE (PF) 100 MCG/2ML IJ SOLN
50.0000 ug | INTRAMUSCULAR | Status: DC | PRN
  Administered 2015-02-20: 50 ug via INTRAVENOUS

## 2015-02-20 MED ORDER — HYDROMORPHONE HCL 1 MG/ML IJ SOLN
INTRAMUSCULAR | Status: AC
  Filled 2015-02-20: qty 1

## 2015-02-20 MED ORDER — AMITRIPTYLINE HCL 50 MG PO TABS
50.0000 mg | ORAL_TABLET | Freq: Every day | ORAL | Status: DC
  Administered 2015-02-20 – 2015-02-21 (×2): 50 mg via ORAL
  Filled 2015-02-20 (×2): qty 1

## 2015-02-20 MED ORDER — ONDANSETRON HCL 4 MG/2ML IJ SOLN
INTRAMUSCULAR | Status: AC
  Filled 2015-02-20: qty 2

## 2015-02-20 MED ORDER — FENTANYL CITRATE (PF) 100 MCG/2ML IJ SOLN
INTRAMUSCULAR | Status: DC | PRN
  Administered 2015-02-20 (×2): 100 ug via INTRAVENOUS
  Administered 2015-02-20: 50 ug via INTRAVENOUS
  Administered 2015-02-20: 100 ug via INTRAVENOUS
  Administered 2015-02-20 (×3): 50 ug via INTRAVENOUS

## 2015-02-20 MED ORDER — OXYCODONE HCL 5 MG PO TABS
ORAL_TABLET | ORAL | Status: AC
  Administered 2015-02-20: 15 mg via ORAL
  Filled 2015-02-20: qty 3

## 2015-02-20 MED ORDER — PROPOFOL 10 MG/ML IV BOLUS
INTRAVENOUS | Status: DC | PRN
  Administered 2015-02-20: 20 mg via INTRAVENOUS
  Administered 2015-02-20: 200 mg via INTRAVENOUS

## 2015-02-20 SURGICAL SUPPLY — 52 items
APPLIER CLIP 5 13 M/L LIGAMAX5 (MISCELLANEOUS)
APPLIER CLIP ROT 10 11.4 M/L (STAPLE)
BINDER ABD UNIV 12 45-62 (WOUND CARE) IMPLANT
BINDER ABDOMINAL 12 ML 46-62 (SOFTGOODS) ×2 IMPLANT
BINDER ABDOMINAL 46IN 62IN (WOUND CARE)
CANISTER SUCTION 2500CC (MISCELLANEOUS) IMPLANT
CHLORAPREP W/TINT 26ML (MISCELLANEOUS) ×2 IMPLANT
CLIP APPLIE 5 13 M/L LIGAMAX5 (MISCELLANEOUS) IMPLANT
CLIP APPLIE ROT 10 11.4 M/L (STAPLE) IMPLANT
COVER SURGICAL LIGHT HANDLE (MISCELLANEOUS) ×2 IMPLANT
DEVICE SECURE STRAP 25 ABSORB (INSTRUMENTS) ×4 IMPLANT
DEVICE TROCAR PUNCTURE CLOSURE (ENDOMECHANICALS) ×2 IMPLANT
DRAPE LAPAROSCOPIC ABDOMINAL (DRAPES) ×2 IMPLANT
ELECT REM PT RETURN 9FT ADLT (ELECTROSURGICAL) ×2
ELECTRODE REM PT RTRN 9FT ADLT (ELECTROSURGICAL) ×1 IMPLANT
FILTER SMOKE EVAC LAPAROSHD (FILTER) IMPLANT
GLOVE BIO SURGEON STRL SZ7 (GLOVE) ×6 IMPLANT
GLOVE BIO SURGEON STRL SZ8 (GLOVE) ×2 IMPLANT
GLOVE BIOGEL PI IND STRL 7.0 (GLOVE) ×1 IMPLANT
GLOVE BIOGEL PI IND STRL 7.5 (GLOVE) ×1 IMPLANT
GLOVE BIOGEL PI IND STRL 8 (GLOVE) ×1 IMPLANT
GLOVE BIOGEL PI INDICATOR 7.0 (GLOVE) ×1
GLOVE BIOGEL PI INDICATOR 7.5 (GLOVE) ×1
GLOVE BIOGEL PI INDICATOR 8 (GLOVE) ×1
GOWN STRL REUS W/ TWL LRG LVL3 (GOWN DISPOSABLE) ×2 IMPLANT
GOWN STRL REUS W/ TWL XL LVL3 (GOWN DISPOSABLE) ×1 IMPLANT
GOWN STRL REUS W/TWL LRG LVL3 (GOWN DISPOSABLE) ×2
GOWN STRL REUS W/TWL XL LVL3 (GOWN DISPOSABLE) ×1
KIT BASIN OR (CUSTOM PROCEDURE TRAY) ×2 IMPLANT
KIT ROOM TURNOVER OR (KITS) ×2 IMPLANT
LIQUID BAND (GAUZE/BANDAGES/DRESSINGS) ×4 IMPLANT
MARKER SKIN DUAL TIP RULER LAB (MISCELLANEOUS) ×2 IMPLANT
MESH VENTRALIGHT ST 8X10 (Mesh General) ×2 IMPLANT
NEEDLE 22X1 1/2 (OR ONLY) (NEEDLE) ×2 IMPLANT
NEEDLE SPNL 22GX3.5 QUINCKE BK (NEEDLE) ×2 IMPLANT
NS IRRIG 1000ML POUR BTL (IV SOLUTION) ×2 IMPLANT
PAD ARMBOARD 7.5X6 YLW CONV (MISCELLANEOUS) ×4 IMPLANT
SCALPEL HARMONIC ACE (MISCELLANEOUS) ×2 IMPLANT
SCISSORS LAP 5X35 DISP (ENDOMECHANICALS) ×2 IMPLANT
SET IRRIG TUBING LAPAROSCOPIC (IRRIGATION / IRRIGATOR) IMPLANT
SHEARS HARMONIC ACE PLUS 36CM (ENDOMECHANICALS) ×2 IMPLANT
SUT PROLENE 0 CT 1 CR/8 (SUTURE) ×2 IMPLANT
SUT VIC AB 4-0 PS2 27 (SUTURE) ×2 IMPLANT
SUT VICRYL 0 TIES 12 18 (SUTURE) IMPLANT
TOWEL OR 17X24 6PK STRL BLUE (TOWEL DISPOSABLE) ×2 IMPLANT
TOWEL OR 17X26 10 PK STRL BLUE (TOWEL DISPOSABLE) ×2 IMPLANT
TRAY FOLEY CATH 16FR SILVER (SET/KITS/TRAYS/PACK) ×2 IMPLANT
TRAY LAPAROSCOPIC MC (CUSTOM PROCEDURE TRAY) ×2 IMPLANT
TROCAR XCEL BLUNT TIP 100MML (ENDOMECHANICALS) IMPLANT
TROCAR XCEL NON-BLD 11X100MML (ENDOMECHANICALS) ×2 IMPLANT
TROCAR XCEL NON-BLD 5MMX100MML (ENDOMECHANICALS) ×6 IMPLANT
TUBING INSUFFLATION (TUBING) ×2 IMPLANT

## 2015-02-20 NOTE — Anesthesia Postprocedure Evaluation (Signed)
Anesthesia Post Note  Patient: Kimberly Weiss  Procedure(s) Performed: Procedure(s) (LRB): LAPAROSCOPIC INCISIONAL HERNIA (N/A) INSERTION OF MESH (N/A)  Patient location during evaluation: PACU Anesthesia Type: General Level of consciousness: awake and awake and alert Pain management: pain level controlled Vital Signs Assessment: post-procedure vital signs reviewed and stable Respiratory status: spontaneous breathing and nonlabored ventilation Anesthetic complications: no    Last Vitals:  Filed Vitals:   02/20/15 1104 02/20/15 1348  BP: 110/70 127/61  Pulse: 99 100  Temp: 36.9 C 36.4 C  Resp: 18 14    Last Pain:  Filed Vitals:   02/20/15 1429  PainSc: 10-Worst pain ever                 Nickalas Mccarrick COKER

## 2015-02-20 NOTE — Transfer of Care (Signed)
Immediate Anesthesia Transfer of Care Note  Patient: Kimberly CookeyKatherine N Edler  Procedure(s) Performed: Procedure(s): LAPAROSCOPIC INCISIONAL HERNIA (N/A) INSERTION OF MESH (N/A)  Patient Location: PACU  Anesthesia Type:General  Level of Consciousness: awake, alert  and oriented  Airway & Oxygen Therapy: Patient Spontanous Breathing and Patient connected to nasal cannula oxygen  Post-op Assessment: Report given to RN, Post -op Vital signs reviewed and stable and Patient moving all extremities  Post vital signs: Reviewed and stable  Last Vitals:  Filed Vitals:   02/20/15 1104 02/20/15 1348  BP: 110/70   Pulse: 99   Temp: 36.9 C 36.4 C  Resp: 18     Complications: No apparent anesthesia complications

## 2015-02-20 NOTE — Op Note (Signed)
02/20/2015  1:54 PM  PATIENT:  Kimberly Weiss  34 y.o. female  PRE-OPERATIVE DIAGNOSIS:  incisional hernia     POST-OPERATIVE DIAGNOSIS:  incisional hernia   PROCEDURE:  Procedure(s): LAPAROSCOPIC REPAIR INCISIONAL HERNIA INSERTION OF MESH LAPAROSCOPIC LYSIS OF ADHESIONS  SURGEON:  Surgeon(s): Violeta Gelinas, MD  ASSISTANTS: none   ANESTHESIA:   local and general  EBL:  Total I/O In: 900 [I.V.:900] Out: 25 [Blood:25]  BLOOD ADMINISTERED:none  DRAINS: none   SPECIMEN:  No Specimen  DISPOSITION OF SPECIMEN:  N/A  COUNTS:  YES  DICTATION: .Dragon Dictation Findings: One large and one medium-sized hernia defect along her upper midline, adhesions involving the omentum and stomach  Procedure in detail: Kimberly Weiss presents for laparoscopic repair of incisional hernia with mesh. She was identified in the preop holding area. She received intravenous antibiotics. She was brought to the operating room and general endotracheal anesthesia was administered by the anesthesia staff. Her abdomen was prepped and draped in a sterile fashion. Time out procedure was performed. She was positioned head up and rotated towards the right. Small left subcostal incision was made after injecting local. 5 mm Optiview technique was used to place a port in the left upper quadrant. There were no complicating features from this. The abdomen was insufflated with carbon dioxide in standard fashion. Next, a right upper quadrant and right mid abdomen 5 mm port were placed. Finally, 12 mm left lower quadrant port was placed. Local was used at each port site. Arthroscopic exploration revealed omentum going up into to upper midline hernia defects. This was gradually reduced and adhesions were lysed. Cautery was used to get good hemostasis. Further adhesiolysis was required where the stomach was stuck up to the falciform from her previous surgery. This was done carefully. There were no enterotomies. Total  laparoscopic adhesiolysis took 30 minutes. The falciform itself was taken down with the harmonic scalpel achieving excellent hemostasis. The stomach, small bowel, and nearby colon were inspected and there were no complications. Next, the extent of both hernia defects were measured out and then, to allow an additional at least 5 cm of mesh beyond all defects, and approximately 25 x 20 cm ventralight mesh was chosen. 6 sutures of 0 Prolene were placed along the edges. It was rolled up and inserted into the abdomen. It was unfurled and positioned appropriately. Next, small stab wounds were placed at each of the 6 suture locations. Endo Catch was used to, via separate passes, liver the sutures up into each of the stab wounds. Next the mesh was brought up flush with the abdominal wall and all sutures were tied. Secure strap tacks were then inserted into concentric rings, one towards the periphery of the mesh, and the second several centimeters inside the edge. The mesh laid nice and flat. There is no significant the abdomen was then inspected again in 4 quadrants and no other competitions were noted. There was no syncope bleeding from the omentum after the previous adhesiolysis. Ports were removed under direct vision. Pneumoperitoneum was released. Ports sites were irrigated. Skin of each was closed with running 4-0 Vicryl subcuticular followed by liquid band. The 6 suture stab sites were closed with liquid band. Abdominal binder was placed. All counts were correct. She tolerated procedure well without apparent complications to recovery in stable condition. PATIENT DISPOSITION:  PACU - hemodynamically stable.   Delay start of Pharmacological VTE agent (>24hrs) due to surgical blood loss or risk of bleeding:  no  Violeta Gelinas, MD,  MPH, FACS Pager: [phone]  12/22/20161:54 PM

## 2015-02-20 NOTE — Interval H&P Note (Signed)
History and Physical Interval Note:  02/20/2015 11:12 AM  Kimberly CookeyKatherine N Pyle  has presented today for surgery, with the diagnosis of incisional hernia     The various methods of treatment have been discussed with the patient and family. After consideration of risks, benefits and other options for treatment, the patient has consented to  Procedure(s): LAPAROSCOPIC INCISIONAL HERNIA (N/A) INSERTION OF MESH (N/A) as a surgical intervention .  The patient's history has been reviewed, patient re-examined, no change in status, stable for surgery.  I have reviewed the patient's chart and labs.  Questions were answered to the patient's satisfaction.     Raquel Sayres E

## 2015-02-20 NOTE — Anesthesia Preprocedure Evaluation (Addendum)
Anesthesia Evaluation  Patient identified by MRN, date of birth, ID band Patient awake    Reviewed: Allergy & Precautions, NPO status , Patient's Chart, lab work & pertinent test results  Airway Mallampati: I  TM Distance: >3 FB Neck ROM: Full    Dental  (+) Teeth Intact, Dental Advisory Given   Pulmonary Current Smoker,    breath sounds clear to auscultation       Cardiovascular hypertension,  Rhythm:Regular Rate:Normal     Neuro/Psych    GI/Hepatic GERD  Controlled,  Endo/Other    Renal/GU      Musculoskeletal   Abdominal   Peds  Hematology   Anesthesia Other Findings   Reproductive/Obstetrics                            Anesthesia Physical Anesthesia Plan  ASA: II  Anesthesia Plan: General   Post-op Pain Management:    Induction: Intravenous  Airway Management Planned: Oral ETT  Additional Equipment:   Intra-op Plan:   Post-operative Plan: Extubation in OR  Informed Consent: I have reviewed the patients History and Physical, chart, labs and discussed the procedure including the risks, benefits and alternatives for the proposed anesthesia with the patient or authorized representative who has indicated his/her understanding and acceptance.   Dental advisory given  Plan Discussed with: CRNA and Anesthesiologist  Anesthesia Plan Comments:         Anesthesia Quick Evaluation

## 2015-02-20 NOTE — Progress Notes (Signed)
Patient complaining of minimal pain relief from prn Dilaudid 2 mg Q3 and prn oxycodone 15 mg Q4. MD on call paged. Robaxin 750 mg ordered prn. Will continue to monitor patient.

## 2015-02-20 NOTE — Anesthesia Postprocedure Evaluation (Signed)
Anesthesia Post Note  Patient: Toy CookeyKatherine N Winokur  Procedure(s) Performed: Procedure(s) (LRB): LAPAROSCOPIC INCISIONAL HERNIA (N/A) INSERTION OF MESH (N/A)  Patient location during evaluation: PACU Anesthesia Type: General Level of consciousness: awake and awake and alert Pain management: pain level controlled Vital Signs Assessment: post-procedure vital signs reviewed and stable Respiratory status: nonlabored ventilation Anesthetic complications: no    Last Vitals:  Filed Vitals:   02/20/15 1104 02/20/15 1348  BP: 110/70 127/61  Pulse: 99 100  Temp: 36.9 C 36.4 C  Resp: 18 14    Last Pain:  Filed Vitals:   02/20/15 1429  PainSc: 10-Worst pain ever                 Han Vejar COKER

## 2015-02-20 NOTE — Anesthesia Procedure Notes (Signed)
Procedure Name: Intubation Date/Time: 02/20/2015 11:59 AM Performed by: Sharlene DoryWALKER, Bryla Burek E Pre-anesthesia Checklist: Patient identified, Emergency Drugs available, Suction available, Patient being monitored and Timeout performed Patient Re-evaluated:Patient Re-evaluated prior to inductionOxygen Delivery Method: Circle system utilized Preoxygenation: Pre-oxygenation with 100% oxygen Intubation Type: IV induction Ventilation: Mask ventilation without difficulty Laryngoscope Size: Mac and 3 Grade View: Grade I Number of attempts: 1 Airway Equipment and Method: Stylet Placement Confirmation: ETT inserted through vocal cords under direct vision,  breath sounds checked- equal and bilateral,  positive ETCO2 and CO2 detector Secured at: 22 cm Tube secured with: Tape Dental Injury: Teeth and Oropharynx as per pre-operative assessment  Comments: AOI by Bethena RoysKristie Bryan, SRNA under supervision of anesthesiologist and CRNA

## 2015-02-21 ENCOUNTER — Encounter (HOSPITAL_COMMUNITY): Payer: Self-pay | Admitting: General Surgery

## 2015-02-21 LAB — BASIC METABOLIC PANEL
Anion gap: 7 (ref 5–15)
BUN: 5 mg/dL — ABNORMAL LOW (ref 6–20)
CO2: 26 mmol/L (ref 22–32)
Calcium: 8.9 mg/dL (ref 8.9–10.3)
Chloride: 103 mmol/L (ref 101–111)
Creatinine, Ser: 0.72 mg/dL (ref 0.44–1.00)
GFR calc Af Amer: >60 mL/min (ref >60)
GFR calc non Af Amer: >60 mL/min (ref >60)
Glucose, Bld: 131 mg/dL — ABNORMAL HIGH (ref 65–99)
Potassium: 4.4 mmol/L (ref 3.5–5.1)
Sodium: 136 mmol/L (ref 135–145)

## 2015-02-21 LAB — CBC
HCT: 38.4 % (ref 36.0–46.0)
Hemoglobin: 12.4 g/dL (ref 12.0–15.0)
MCH: 29.4 pg (ref 26.0–34.0)
MCHC: 32.3 g/dL (ref 30.0–36.0)
MCV: 91 fL (ref 78.0–100.0)
Platelets: 284 10*3/uL (ref 150–400)
RBC: 4.22 MIL/uL (ref 3.87–5.11)
RDW: 15 % (ref 11.5–15.5)
WBC: 15.2 10*3/uL — ABNORMAL HIGH (ref 4.0–10.5)

## 2015-02-21 MED ORDER — OXYCODONE HCL 5 MG PO TABS
10.0000 mg | ORAL_TABLET | ORAL | Status: DC | PRN
  Administered 2015-02-21 – 2015-02-22 (×6): 20 mg via ORAL
  Filled 2015-02-21 (×6): qty 4

## 2015-02-21 MED ORDER — TRAMADOL HCL 50 MG PO TABS
50.0000 mg | ORAL_TABLET | Freq: Four times a day (QID) | ORAL | Status: DC
  Administered 2015-02-21 – 2015-02-22 (×5): 50 mg via ORAL
  Filled 2015-02-21 (×5): qty 1

## 2015-02-21 NOTE — Progress Notes (Signed)
Patient reports current pain management is not sufficient in reducing pain (also see flowsheet).  Dr. Corliss Skainssuei made aware.

## 2015-02-21 NOTE — Progress Notes (Signed)
1 Day Post-Op  Subjective: Hungry, walked the halls several times last night. Pain not controlled - asking for dilaudid 4mg  per dose.  Objective: Vital signs in last 24 hours: Temp:  [97.5 F (36.4 C)-98.7 F (37.1 C)] 97.9 F (36.6 C) (12/23 0601) Pulse Rate:  [66-100] 66 (12/23 0601) Resp:  [14-23] 17 (12/23 0601) BP: (95-127)/(61-77) 110/70 mmHg (12/23 0601) SpO2:  [94 %-100 %] 100 % (12/23 0601) Weight:  [78.019 kg (172 lb)-83.915 kg (185 lb)] 83.915 kg (185 lb) (12/22 1553) Last BM Date: 02/19/15  Intake/Output from previous day: 12/22 0701 - 12/23 0700 In: 3470 [P.O.:2570; I.V.:900] Out: 2825 [Urine:2800; Blood:25] Intake/Output this shift:    General appearance: cooperative Resp: clear to auscultation bilaterally Cardio: regular rate and rhythm GI: soft, incisions CDI, binder replaced, active BS  Lab Results:   Recent Labs  02/20/15 1713 02/21/15 0614  WBC 19.4* 15.2*  HGB 13.0 12.4  HCT 40.3 38.4  PLT 274 284   BMET  Recent Labs  02/19/15 1427 02/20/15 1713 02/21/15 0614  NA 140  --  136  K 4.4  --  4.4  CL 109  --  103  CO2 25  --  26  GLUCOSE 89  --  131*  BUN 8  --  5*  CREATININE 0.68 0.82 0.72  CALCIUM 9.2  --  8.9   PT/INR No results for input(s): LABPROT, INR in the last 72 hours. ABG No results for input(s): PHART, HCO3 in the last 72 hours.  Invalid input(s): PCO2, PO2  Studies/Results: No results found.  Anti-infectives: Anti-infectives    Start     Dose/Rate Route Frequency Ordered Stop   02/20/15 1230  ceFAZolin (ANCEF) IVPB 2 g/50 mL premix     2 g 100 mL/hr over 30 Minutes Intravenous To ShortStay Surgical 02/19/15 1037 02/20/15 1205      Assessment/Plan: s/p Procedure(s): LAPAROSCOPIC INCISIONAL HERNIA (N/A) INSERTION OF MESH (N/A) POD#1 FEN/pain control - difficult with her history, increase oxycodone, add scheduled tramadol. I advised her Dilaudid 4mg  is unsafe. Continue dilaudid as ordered for breakthrough. Reg  diet. VTE - Lovenox  LOS: 1 day    Khaled Herda E 02/21/2015

## 2015-02-21 NOTE — Progress Notes (Signed)
Chaplain presented to the patient to provide pastoral support and complete a spiritual care consult.  Chaplain dialogued with the patient in reference to any prayer request she may have at this time.  She shared her desire to be home on Christmas in order to be home with her young son, she also mentions wanting to make sure her pain levels were of managable prior to her discharge.  Chaplain offered prayer of healing and wholeness on behalf of the patient regarding her individual needs.  The patient thanked the Chaplain for the pastoral visit. Chaplain Janell QuietAudrey Thornton [phone]

## 2015-02-22 LAB — BASIC METABOLIC PANEL
Anion gap: 8 (ref 5–15)
BUN: 7 mg/dL (ref 6–20)
CO2: 27 mmol/L (ref 22–32)
Calcium: 9 mg/dL (ref 8.9–10.3)
Chloride: 104 mmol/L (ref 101–111)
Creatinine, Ser: 0.65 mg/dL (ref 0.44–1.00)
GFR calc Af Amer: >60 mL/min (ref >60)
GFR calc non Af Amer: >60 mL/min (ref >60)
Glucose, Bld: 98 mg/dL (ref 65–99)
Potassium: 4.1 mmol/L (ref 3.5–5.1)
Sodium: 139 mmol/L (ref 135–145)

## 2015-02-22 LAB — CBC
HCT: 36 % (ref 36.0–46.0)
Hemoglobin: 11.5 g/dL — ABNORMAL LOW (ref 12.0–15.0)
MCH: 29.2 pg (ref 26.0–34.0)
MCHC: 31.9 g/dL (ref 30.0–36.0)
MCV: 91.4 fL (ref 78.0–100.0)
Platelets: 236 10*3/uL (ref 150–400)
RBC: 3.94 MIL/uL (ref 3.87–5.11)
RDW: 15.2 % (ref 11.5–15.5)
WBC: 11.5 10*3/uL — ABNORMAL HIGH (ref 4.0–10.5)

## 2015-02-22 MED ORDER — TRAMADOL HCL 50 MG PO TABS: 50.0000 mg | ORAL_TABLET | Freq: Four times a day (QID) | ORAL | Status: DC | PRN

## 2015-02-22 MED ORDER — OXYCODONE HCL 10 MG PO TABS: 10.0000 mg | ORAL_TABLET | ORAL | Status: DC | PRN

## 2015-02-22 NOTE — Progress Notes (Signed)
Patient continued to complain of uncontrolled pain throughout night. Calling for 2 mg of dilaudid Q3 and 20 of oxycodone Q4, as well as robaxin. At one point during night patient stated that she wanted to be discharged right away due to uncontrolled pain relief. Explained to patient that if she chose to leave in the middle of the night she would not have prescriptions to take home. Patient opted to stay. Will continue to monitor. Cindee SaltMcBride,Kamori Barbier K, RN

## 2015-02-22 NOTE — Discharge Summary (Signed)
Physician Discharge Summary  Patient ID: SAGRARIO LINEBERRY MRN: [id number] DOB/AGE: Sep 10, 1980 34 y.o.  Admit date: 02/20/2015 Discharge date: 02/22/2015  Admission Diagnoses: Incisional hernia  Discharge Diagnoses: Status post laparoscopic repair of incisional hernia Active Problems:   S/P laparoscopic hernia repair   Discharged Condition: good  Hospital Course: Natalia Leatherwood underwent laparoscopic repair of incisional hernia with mesh. Postoperatively, pain control was an issue in light of her past medical history. Scheduled tramadol and increased oxycodone regimen were used to achieve adequate pain control. She tolerated advancement of her diet and is ready for discharge.  Consults: None  Significant Diagnostic Studies: none  Treatments: surgery: above  Discharge Exam: Blood pressure 105/74, pulse 101, temperature 97.8 F (36.6 C), temperature source Oral, resp. rate 17, height  (1.6 m), weight 83.915 kg (185 lb), SpO2 97 %. General appearance: alert and cooperative Resp: clear to auscultation bilaterally Cardio: S1, S2 normal GI: Soft, incisions clean dry and intact, no evidence of infection, active bowel sounds  Disposition: 01-Home or Self Care  Discharge Instructions    Diet - low sodium heart healthy    Complete by:  As directed      Discharge instructions    Complete by:  As directed   [facility] _______[facility]  UMBILICAL OR INGUINAL HERNIA REPAIR: POST OP INSTRUCTIONS  Always review your discharge instruction sheet given to you by the facility where your surgery was performed. IF YOU HAVE DISABILITY OR FAMILY LEAVE FORMS, YOU MUST BRING THEM TO THE OFFICE FOR PROCESSING.   DO NOT GIVE THEM TO YOUR DOCTOR.  A  prescription for pain medication may be given to you upon discharge.  Take your pain medication as prescribed, if needed.  If narcotic pain medicine is not needed, then you may take acetaminophen (Tylenol) or ibuprofen (Advil) as  needed. Take your usually prescribed medications unless otherwise directed. If you need a refill on your pain medication, please contact your pharmacy.  They will contact our office to request authorization. Prescriptions will not be filled after 5 pm or on week-ends. You should follow a light diet the first 24 hours after arrival home, such as soup and crackers, etc.  Be sure to include lots of fluids daily.  Resume your normal diet the day after surgery. Most patients will experience some swelling and bruising around the umbilicus or in the groin and scrotum.  Ice packs and reclining will help.  Swelling and bruising can take several days to resolve.  It is common to experience some constipation if taking pain medication after surgery.  Increasing fluid intake and taking a stool softener (such as Colace) will usually help or prevent this problem from occurring.  A mild laxative (Milk of Magnesia or Miralax) should be taken according to package directions if there are no bowel movements after 48 hours. Unless discharge instructions indicate otherwise, you may remove your bandages 24-48 hours after surgery, and you may shower at that time.  You may have steri-strips (small skin tapes) in place directly over the incision.  These strips should be left on the skin for 7-10 days.  If your surgeon used skin glue on the incision, you may shower in 24 hours.  The glue will flake off over the next 2-3 weeks.  Any sutures or staples will be removed at the office during your follow-up visit. ACTIVITIES:  You may resume regular (light) daily activities beginning the next day-such as daily self-care, walking, climbing stairs-gradually increasing activities as tolerated.  You may have sexual intercourse when it is comfortable.  Refrain from any heavy lifting or straining until approved by your doctor. You may drive when you are no longer taking prescription pain medication, you can comfortably wear a seatbelt, and you  can safely maneuver your car and apply brakes. RETURN TO WORK:  __________________________________________________________ Bonita Quin should see your doctor in the office for a follow-up appointment approximately 2-3 weeks after your surgery.  Make sure that you call for this appointment within a day or two after you arrive home to insure a convenient appointment time. OTHER INSTRUCTIONS:  __________________________________________________________________________________________________________________________________________________________________________________________  WHEN TO CALL YOUR DOCTOR: Fever over 101.0 Inability to urinate Nausea and/or vomiting Extreme swelling or bruising Continued bleeding from incision. Increased pain, redness, or drainage from the incision  The clinic staff is available to answer your questions during regular business hours.  Please don't hesitate to call and ask to speak to one of the nurses for clinical concerns.  If you have a medical emergency, go to the nearest emergency room or call 911.  A surgeon from [facility] is always on call at the hospital   [address], [place], [place]  [address] ?  [address] [address], [place], [place]   [address] [phone] ? [phone] ? FAX [phone] Web site: [email or web address]     Increase activity slowly    Complete by:  As directed      Lifting restrictions    Complete by:  As directed   Do not lift over 10lbs for 6 weeks. Wear abdominal binder when up out of bed.     No dressing needed    Complete by:  As directed             Medication List    TAKE these medications        Acetaminophen 500 MG coapsule  Take by mouth every 6 (six) hours as needed for fever.     albuterol 108 (90 BASE) MCG/ACT inhaler  Commonly known as:  PROVENTIL HFA;VENTOLIN HFA  Inhale 2 puffs into the lungs every 6 (six) hours as needed for wheezing or shortness of breath.      amitriptyline 50 MG tablet  Commonly known as:  ELAVIL  TAKE 1 TABLET (50 MG TOTAL) BY MOUTH AT BEDTIME.     clonazePAM 1 MG tablet  Commonly known as:  KLONOPIN  Take 0.5-1 tablets (0.5-1 mg total) by mouth 2 (two) times daily as needed for anxiety.     fluticasone 50 MCG/ACT nasal spray  Commonly known as:  FLONASE  Place 2 sprays into both nostrils daily.     hydrochlorothiazide 25 MG tablet  Commonly known as:  HYDRODIURIL  TAKE ONE-HALF TO ONE TABLET BY MOUTH EVERY MORNING AS NEEDED FOR SWELLING     KLOR-CON 10 10 MEQ tablet  Generic drug:  potassium chloride  TAKE 1 TABLET BY MOUTH DAILY WHEN TAKING HCTZ     levothyroxine 50 MCG tablet  Commonly known as:  SYNTHROID, LEVOTHROID  TAKE 1 TABLET EVERY DAY     Oxycodone HCl 10 MG Tabs  Take 1-2 tablets (10-20 mg total) by mouth every 4 (four) hours as needed (  for mild pain,  for moderate pain,  for severe pain).     pantoprazole 40 MG tablet  Commonly known as:  PROTONIX  Take 1 tablet (40 mg total) by mouth 2 (two) times daily.     promethazine 25 MG tablet  Commonly known as:  PHENERGAN  Take 1 tablet (25 mg total) by mouth every 6 (six) hours as needed for nausea or vomiting.     sucralfate 1 GM/10ML suspension  Commonly known as:  CARAFATE  TAKE 10 MLS (1 G TOTAL) BY MOUTH 4 (FOUR) TIMES DAILY - WITH MEALS AND AT BEDTIME.     traMADol 50 MG tablet  Commonly known as:  ULTRAM  Take 1 tablet (50 mg total) by mouth every 6 (six) hours as needed for moderate pain.     venlafaxine XR 75 MG 24 hr capsule  Commonly known as:  EFFEXOR-XR  TAKE 2 CAPSULES BY MOUTH EVERY DAY WITH BREAKFAST           Follow-up Information    Follow up with Liz MaladyHOMPSON,Nour Scalise E, MD On 03/05/2015.   Specialty:  General Surgery   Why:  For wound re-check as scheduled   Contact information:   [address] [place] [id number] [phone]       Signed: Liz MaladyHOMPSON,Johnney Scarlata E 02/22/2015, 7:59 AM

## 2015-02-22 NOTE — Progress Notes (Signed)
Discharge paperwork given to patient. Prescriptions given to patient. IV removed. No questions verbalized.

## 2015-02-24 ENCOUNTER — Other Ambulatory Visit: Payer: Self-pay | Admitting: Family Medicine

## 2015-03-27 ENCOUNTER — Telehealth: Payer: Self-pay | Admitting: Family Medicine

## 2015-03-27 DIAGNOSIS — Z79891 Long term (current) use of opiate analgesic: Secondary | ICD-10-CM

## 2015-03-27 NOTE — Telephone Encounter (Signed)
She wanted to let you know that she is not going to go back to this doctors office She feels it is racially motivated. She does not feel she is being treated unfairly.  She does not have the money to pay for second visit they are requiring her to make.  Very upset an very agitaited.

## 2015-03-27 NOTE — Telephone Encounter (Signed)
Pt called stating that she can not deal with the pain management doctor that she is currently with. Pt states that she doesn't have enough pain medicines to last her. Pt is wanting to know if Dr. Lorin Picket will write her enough until she can get a different pain management dr. Rock Nephew states that if he is unable to do it then she will have to find another primary care dr. Rock Nephew stated that she went to her pain management appt today and they told her to come back tomorrow to speak with the dr personally and to fill out paperwork. Pt was crying and upset because they didn't give her a refill and is making her come back. Pt states that she doesn't have to money to pay the copays that the pain management dr is requiring.

## 2015-03-28 ENCOUNTER — Encounter (HOSPITAL_COMMUNITY): Payer: Self-pay | Admitting: Emergency Medicine

## 2015-03-28 ENCOUNTER — Emergency Department (HOSPITAL_COMMUNITY)
Admission: EM | Admit: 2015-03-28 | Discharge: 2015-03-28 | Disposition: A | Discharge status: Home / Self Care | Payer: Medicaid Other | Source: Home / Self Care | Attending: Family Medicine | Admitting: Family Medicine

## 2015-03-28 DIAGNOSIS — Z760 Encounter for issue of repeat prescription: Secondary | ICD-10-CM

## 2015-03-28 MED ORDER — CLONAZEPAM 1 MG PO TABS: 0.5000 mg | ORAL_TABLET | Freq: Two times a day (BID) | ORAL | Status: DC | PRN

## 2015-03-28 NOTE — Telephone Encounter (Signed)
Pt is upset. Wants referral to a new pain management.  She states she will not be going back to that pain management and wants you to resume care of her until she can get in with new pain management. Explained to pt that you are not willing to prescribe her any pain meds. Pt very upset and states she will have to find another pcp if you dont

## 2015-03-28 NOTE — Discharge Instructions (Signed)
Medicine Refill at the Emergency Department or Urgent Care We have refilled your medicine today, but it is best for you to get refills through your primary health care provider's office. In the future, please plan ahead so you do not need to get refills from the emergency department. If the medicine we refilled was a maintenance medicine, you may have received only enough to get you by until you are able to see your regular health care provider.   This information is not intended to replace advice given to you by your health care provider. Make sure you discuss any questions you have with your health care provider.   Document Released: 06/04/2003 Document Revised: 03/08/2014 Document Reviewed: 05/25/2013 Elsevier Interactive Patient Education [employer]

## 2015-03-28 NOTE — ED Provider Notes (Signed)
CSN: [id number]     Arrival date & time 03/28/15  1719 History   First MD Initiated Contact with Patient 03/28/15 1729     Chief Complaint  Patient presents with  . Back Pain   (Consider location/radiation/quality/duration/timing/severity/associated sxs/prior Treatment) HPI Comments: 35 year old female presents to the urgent care to have refills of her narcotic opiate medications. She apparently had a problem with her last pain clinic that she attributes to a racial problem. She is no longer being seen there. She has chronic back pain. She states she has had 6 surgeries on her back and that she had surgery for a duodenal ulcer in March 2016. Her PCP will not refill her opioids and referred her to the urgent care or the emergency department.  Patient is a 35 y.o. female presenting with back pain.  Back Pain   Past Medical History  Diagnosis Date  . Interstitial cystitis   . Thyroid disease   . Hypothyroidism   . Chronic lower back pain   . GI bleed   . Anxiety   . Depression   . Hypertension   . History of kidney stones   . GERD (gastroesophageal reflux disease)   . Pneumonia ~ 2008  . Anemia   . History of blood transfusion 04/2014    "related to duodenal ulcer"  . History of duodenal ulcer 04/2014    "perforated"  . Daily headache     "related to stress and sinus" (02/20/2015)  . Renal stones    Past Surgical History  Procedure Laterality Date  . Esophagogastroduodenoscopy N/A 04/27/2014    Procedure: ESOPHAGOGASTRODUODENOSCOPY (EGD);  Surgeon: Shirley Friar, MD;  Location: [facility] ENDOSCOPY;  Service: Endoscopy;  Laterality: N/A;  . Esophagogastroduodenoscopy (egd) with propofol N/A 05/03/2014    Procedure: ESOPHAGOGASTRODUODENOSCOPY (EGD) WITH PROPOFOL;  Surgeon: Shirley Friar, MD;  Location: [facility] ENDOSCOPY;  Service: Endoscopy;  Laterality: N/A;  . Laparotomy N/A 05/07/2014    Procedure: EXPLORATORY LAPAROTOMY;  Surgeon: Violeta Gelinas, MD;  Location: [facility] OR;  Service:  General;  Laterality: N/A;  . Repair of perforated ulcer N/A 05/07/2014    Procedure: Velna Hatchet DUODENAL ULCER;  Surgeon: Violeta Gelinas, MD;  Location: [facility] OR;  Service: General;  Laterality: N/A;  . Cesarean section  05/13/2004  . Bladder instillation  2004  . Lithotripsy  2004  . Laparoscopic incisional / umbilical / ventral hernia repair  02/20/2015    IHR w/mesh & LOA  . Tonsillectomy  05/17/2008  . Hernia repair    . Nasal sinus surgery  05/17/2008    "removed bone spurs in my sinus cavity"  . Incisional hernia repair N/A 02/20/2015    Procedure: LAPAROSCOPIC INCISIONAL HERNIA;  Surgeon: Violeta Gelinas, MD;  Location: [facility] OR;  Service: General;  Laterality: N/A;  . Insertion of mesh N/A 02/20/2015    Procedure: INSERTION OF MESH;  Surgeon: Violeta Gelinas, MD;  Location: [facility] OR;  Service: General;  Laterality: N/A;   History reviewed. No pertinent family history. Social History  Substance Use Topics  . Smoking status: Current Every Day Smoker -- 1.50 packs/day for 20 years  . Smokeless tobacco: Never Used  . Alcohol Use: Yes     Comment: 02/20/2015 "might have 2-3 beers every several months"   OB History    No data available     Review of Systems  Constitutional: Positive for activity change.  Respiratory: Negative.   Musculoskeletal: Positive for back pain.  Skin: Negative.     Allergies  Gabapentin; Zofran;  and Opana  Home Medications   Prior to Admission medications   Medication Sig Start Date End Date Taking? Authorizing Provider  Acetaminophen 500 MG coapsule Take by mouth every 6 (six) hours as needed for fever.    Historical Provider, MD  albuterol (PROVENTIL HFA;VENTOLIN HFA) 108 (90 BASE) MCG/ACT inhaler Inhale 2 puffs into the lungs every 6 (six) hours as needed for wheezing or shortness of breath.    Historical Provider, MD  amitriptyline (ELAVIL) 50 MG tablet TAKE 1 TABLET (50 MG TOTAL) BY MOUTH AT BEDTIME. 01/22/15   Babs Sciara, MD  clonazePAM (KLONOPIN) 1  MG tablet Take 0.5-1 tablets (0.5-1 mg total) by mouth 2 (two) times daily as needed for anxiety. Caution drowsiness 03/28/15   Babs Sciara, MD  fluticasone (FLONASE) 50 MCG/ACT nasal spray Place 2 sprays into both nostrils daily. Patient taking differently: Place 2 sprays into both nostrils daily as needed for allergies.  01/28/15   Charm Rings, MD  hydrochlorothiazide (HYDRODIURIL) 25 MG tablet TAKE ONE-HALF TO ONE TABLET BY MOUTH EVERY MORNING AS NEEDED FOR SWELLING 07/30/14   Merlyn Albert, MD  KLOR-CON 10 10 MEQ tablet TAKE 1 TABLET BY MOUTH DAILY WHEN TAKING HCTZ 11/11/14   Babs Sciara, MD  levothyroxine (SYNTHROID, LEVOTHROID) 50 MCG tablet TAKE 1 TABLET BY MOUTH EVERY DAY 02/25/15   Babs Sciara, MD  oxyCODONE 10 MG TABS Take 1-2 tablets (10-20 mg total) by mouth every 4 (four) hours as needed (  for mild pain,  for moderate pain,  for severe pain). 02/22/15   Violeta Gelinas, MD  pantoprazole (PROTONIX) 40 MG tablet Take 1 tablet (40 mg total) by mouth 2 (two) times daily. 11/11/14   Babs Sciara, MD  promethazine (PHENERGAN) 25 MG tablet Take 1 tablet (25 mg total) by mouth every 6 (six) hours as needed for nausea or vomiting. 01/12/15   Shon Baton, MD  sucralfate (CARAFATE) 1 GM/10ML suspension TAKE 10 MLS (1 G TOTAL) BY MOUTH 4 (FOUR) TIMES DAILY - WITH MEALS AND AT BEDTIME. 11/11/14   Babs Sciara, MD  traMADol (ULTRAM) 50 MG tablet Take 1 tablet (50 mg total) by mouth every 6 (six) hours as needed for moderate pain. 02/22/15   Violeta Gelinas, MD  venlafaxine XR (EFFEXOR-XR) 75 MG 24 hr capsule TAKE 2 CAPSULES BY MOUTH EVERY DAY WITH BREAKFAST 01/17/15   Babs Sciara, MD   Meds Ordered and Administered this Visit  Medications - No data to display  BP 114/78 mmHg  Pulse 99  Temp(Src) 98.7 F (37.1 C) (Oral)  Resp 18  SpO2 100%  LMP 03/04/2015 (Exact Date) No data found.   Physical Exam  Constitutional: She appears well-developed and well-nourished.  No distress.  Neck: Normal range of motion. Neck supple.  Cardiovascular: Normal rate and regular rhythm.   Pulmonary/Chest: Effort normal.  Neurological: She is alert.  Skin: Skin is warm and dry.  Psychiatric: She has a normal mood and affect.  Nursing note and vitals reviewed.   ED Course  Procedures (including critical care time)  Labs Review Labs Reviewed - No data to display  Imaging Review No results found.   Visual Acuity Review  Right Eye Distance:   Left Eye Distance:   Bilateral Distance:    Right Eye Near:   Left Eye Near:    Bilateral Near:         MDM   1. Encounter for medication refill    Unable  to refill narcotic medications. The [place] controlled substance reporting system list has been reviewed. She has had 180 tablets of oxycodone 15 mg so far this month as well as 50 tramadol 50 mg. In December she had 270 tablets of oxycodone +50 tablets of tramadol written. Follow-up with PCP and/or pain clinic.    Hayden Rasmussen, NP 03/28/15 1836

## 2015-03-28 NOTE — Telephone Encounter (Signed)
I have reviewed this patient's records. She is been under Medicare of pain management. They have prescribed medication to her. We will not be taking that over. She should follow-up with pain management or other pain management of her choice

## 2015-03-28 NOTE — ED Notes (Signed)
The patient presented to the [facility] with a complaint of chronic back pain. The patient stated that she contacted her pcp for pain control meds and he stated that he would not write her pain meds because she was "in between" pain clinics. She stated that he told her to go to the Urgent Care of the ER.

## 2015-03-28 NOTE — Telephone Encounter (Signed)
Discussed with patient. Advised patient Dr Lorin Picket has reviewed this patient's records. She is been under the care of pain management. They have prescribed medication to her. We will not be taking that over. She should follow-up with pain management or other pain management of her choice. Patient verbalized understanding and would like to see [facility] [place]. Patient would also like a refill of klonopin. Consult with Dr Lorin Picket: Scarlette Calico  #15 take one half to one tablet by mouth BID as needed for anxiety-caution drowsiness-not at same time as pain medication Rx faxed to [facility] in [place]. Referral to pain management ordered in EPIC.

## 2015-03-28 NOTE — Telephone Encounter (Signed)
#  1 it is common for all pain management doctors their initial visit to be discussing pain related issues before this next visit where they typically prescribe more medication. Also it is standard in customary for pain medicine doctors only 2 prescribed one month at a time and require follow-up on a regular basis finally we do not reassume care of narcotic prescriptions for patients who are referred to pain management. I will not be prescribing further pain medications.

## 2015-03-31 ENCOUNTER — Telehealth: Payer: Self-pay

## 2015-03-31 NOTE — Telephone Encounter (Signed)
The choices would be Cymbalta in place of Effexor or Lyrica-which is similar to gabapentin but not the same. If possible find out what insurance she is on which dictates formulary and also what side effects she had with gabapentin

## 2015-03-31 NOTE — Telephone Encounter (Signed)
Patient is under medicaid formulary

## 2015-03-31 NOTE — Telephone Encounter (Signed)
Patient is willing to try something non narcotic for her fibromyalgia until her pain management referral goes through.

## 2015-04-01 NOTE — Telephone Encounter (Signed)
I would prefer for this patient to be on Cymbalta he would do the best for helping her pain-apparently she had side effects with gabapentin-please talk with her pharmacy to see if there is generic Cymbalta that could be used it would be best to use 60 mg per day-it would be in the place of Effexor area please make sure that he gets along with her amitriptyline as well. Thank you for doing all of this.

## 2015-04-02 MED ORDER — DULOXETINE HCL 60 MG PO CPEP: 60.0000 mg | ORAL_CAPSULE | Freq: Every day | ORAL | Status: DC

## 2015-04-02 NOTE — Telephone Encounter (Signed)
LMRC

## 2015-04-02 NOTE — Telephone Encounter (Signed)
Notified patient prefer for this patient to be on Cymbalta she would do the best for helping her pain-apparently she had side effects with gabapentin-Cymbalta  60 mg per day-it would be in the place of Effexor area please make sure that she gets along with her amitriptyline as well. Patient verbalized understanding. Med sent to pharmacy.

## 2015-04-08 ENCOUNTER — Encounter: Payer: Self-pay | Admitting: Family Medicine

## 2015-04-23 ENCOUNTER — Other Ambulatory Visit: Payer: Self-pay | Admitting: *Deleted

## 2015-04-23 ENCOUNTER — Telehealth: Payer: Self-pay | Admitting: Family Medicine

## 2015-04-23 MED ORDER — PREGABALIN 50 MG PO CAPS: 50.0000 mg | ORAL_CAPSULE | Freq: Two times a day (BID) | ORAL | Status: DC

## 2015-04-23 NOTE — Telephone Encounter (Signed)
Lyrica 50 mg 1 twice a day, #60, 3 refills-if problems please notify us

## 2015-04-23 NOTE — Telephone Encounter (Signed)
DULoxetine (CYMBALTA) 60 MG capsule  Not doing anything for her chronic pain, Pharmacist called yesterday  To go over with her meds, advised that maybe she try lyrica to help  With the neuropathy pain. It is helping with the depression and fine  With taking it still just wants to know if she can add the Lyrica    [facility] [address] [employer]

## 2015-04-23 NOTE — Telephone Encounter (Signed)
Med sent to pharm. Pt notified.  

## 2015-05-03 ENCOUNTER — Other Ambulatory Visit: Payer: Self-pay | Admitting: Family Medicine

## 2015-05-05 ENCOUNTER — Other Ambulatory Visit: Payer: Self-pay | Admitting: *Deleted

## 2015-05-05 NOTE — Telephone Encounter (Signed)
In order to continue amitriptyline patient cannot take tramadol with this because it increases her risk of serotonin overdose/excess. I would recommend continuing amitriptyline may have 4 refills, DC tramadol.

## 2015-05-09 ENCOUNTER — Emergency Department (HOSPITAL_COMMUNITY)
Admission: EM | Admit: 2015-05-09 | Discharge: 2015-05-09 | Disposition: A | Discharge status: Home / Self Care | Payer: Medicaid Other | Source: Home / Self Care | Attending: Emergency Medicine | Admitting: Emergency Medicine

## 2015-05-09 ENCOUNTER — Telehealth: Payer: Self-pay | Admitting: Family Medicine

## 2015-05-09 ENCOUNTER — Encounter (HOSPITAL_COMMUNITY): Payer: Self-pay

## 2015-05-09 DIAGNOSIS — J069 Acute upper respiratory infection, unspecified: Secondary | ICD-10-CM

## 2015-05-09 MED ORDER — METHYLPREDNISOLONE ACETATE 80 MG/ML IJ SUSP
80.0000 mg | Freq: Once | INTRAMUSCULAR | Status: AC
  Administered 2015-05-09: 80 mg via INTRAMUSCULAR

## 2015-05-09 MED ORDER — ALBUTEROL SULFATE HFA 108 (90 BASE) MCG/ACT IN AERS: 2.0000 | INHALATION_SPRAY | RESPIRATORY_TRACT | Status: DC | PRN

## 2015-05-09 MED ORDER — CETIRIZINE HCL 10 MG PO TABS: 10.0000 mg | ORAL_TABLET | Freq: Every day | ORAL | Status: DC

## 2015-05-09 MED ORDER — METHYLPREDNISOLONE ACETATE 80 MG/ML IJ SUSP
INTRAMUSCULAR | Status: AC
Start: 2015-05-09 — End: 2015-05-09
  Filled 2015-05-09: qty 1

## 2015-05-09 MED ORDER — PREDNISONE 50 MG PO TABS: ORAL_TABLET | ORAL | Status: DC

## 2015-05-09 MED ORDER — HYDROCODONE-HOMATROPINE 5-1.5 MG/5ML PO SYRP: 5.0000 mL | ORAL_SOLUTION | Freq: Four times a day (QID) | ORAL | Status: DC | PRN

## 2015-05-09 MED ORDER — IPRATROPIUM-ALBUTEROL 0.5-2.5 (3) MG/3ML IN SOLN
3.0000 mL | Freq: Once | RESPIRATORY_TRACT | Status: AC
  Administered 2015-05-09: 3 mL via RESPIRATORY_TRACT

## 2015-05-09 MED ORDER — AMOXICILLIN-POT CLAVULANATE 875-125 MG PO TABS
1.0000 | ORAL_TABLET | Freq: Two times a day (BID) | ORAL | Status: DC
Start: 2015-05-09 — End: 2015-06-16

## 2015-05-09 MED ORDER — IPRATROPIUM-ALBUTEROL 0.5-2.5 (3) MG/3ML IN SOLN
RESPIRATORY_TRACT | Status: AC
  Filled 2015-05-09: qty 3

## 2015-05-09 NOTE — ED Provider Notes (Signed)
CSN: [id number]     Arrival date & time 05/09/15  1503 History   First MD Initiated Contact with Patient 05/09/15 1627     Chief Complaint  Patient presents with  . URI  . Cough   (Consider location/radiation/quality/duration/timing/severity/associated sxs/prior Treatment) HPI  She is a 35 year old woman here for evaluation of cough. Her symptoms started 5 days ago with nasal congestion, postnasal drainage, cough, and sinus pressure. She denies any known fevers, but does report some night sweats. No nausea or vomiting. She has been taking Tylenol and an expired albuterol inhaler without much improvement. She states her symptoms have gradually been worsening. She is concerned about a sinus infection or bronchitis. She states she is unable to take over-the-counter medications due to a stomach ulcer.  She was initially seen by Barbra Sarks, PA. He ordered a breathing treatment for her. She states she thinks her breathing is maybe a little bit easier after the treatment. She was concerned that the PA had not done a thorough enough evaluation and requested to see a physician.  Past Medical History  Diagnosis Date  . Interstitial cystitis   . Thyroid disease   . Hypothyroidism   . Chronic lower back pain   . GI bleed   . Anxiety   . Depression   . Hypertension   . History of kidney stones   . GERD (gastroesophageal reflux disease)   . Pneumonia ~ 2008  . Anemia   . History of blood transfusion 04/2014    "related to duodenal ulcer"  . History of duodenal ulcer 04/2014    "perforated"  . Daily headache     "related to stress and sinus" (02/20/2015)  . Renal stones    Past Surgical History  Procedure Laterality Date  . Esophagogastroduodenoscopy N/A 04/27/2014    Procedure: ESOPHAGOGASTRODUODENOSCOPY (EGD);  Surgeon: Shirley Friar, MD;  Location: [facility] ENDOSCOPY;  Service: Endoscopy;  Laterality: N/A;  . Esophagogastroduodenoscopy (egd) with propofol N/A 05/03/2014    Procedure:  ESOPHAGOGASTRODUODENOSCOPY (EGD) WITH PROPOFOL;  Surgeon: Shirley Friar, MD;  Location: [facility] ENDOSCOPY;  Service: Endoscopy;  Laterality: N/A;  . Laparotomy N/A 05/07/2014    Procedure: EXPLORATORY LAPAROTOMY;  Surgeon: Violeta Gelinas, MD;  Location: [facility] OR;  Service: General;  Laterality: N/A;  . Repair of perforated ulcer N/A 05/07/2014    Procedure: Velna Hatchet DUODENAL ULCER;  Surgeon: Violeta Gelinas, MD;  Location: [facility] OR;  Service: General;  Laterality: N/A;  . Cesarean section  05/13/2004  . Bladder instillation  2004  . Lithotripsy  2004  . Laparoscopic incisional / umbilical / ventral hernia repair  02/20/2015    IHR w/mesh & LOA  . Tonsillectomy  05/17/2008  . Hernia repair    . Nasal sinus surgery  05/17/2008    "removed bone spurs in my sinus cavity"  . Incisional hernia repair N/A 02/20/2015    Procedure: LAPAROSCOPIC INCISIONAL HERNIA;  Surgeon: Violeta Gelinas, MD;  Location: [facility] OR;  Service: General;  Laterality: N/A;  . Insertion of mesh N/A 02/20/2015    Procedure: INSERTION OF MESH;  Surgeon: Violeta Gelinas, MD;  Location: [facility] OR;  Service: General;  Laterality: N/A;   No family history on file. Social History  Substance Use Topics  . Smoking status: Current Every Day Smoker -- 1.50 packs/day for 20 years  . Smokeless tobacco: Never Used  . Alcohol Use: No   OB History    No data available     Review of Systems As in history of  present illness Allergies  Gabapentin; Zofran; and Opana  Home Medications   Prior to Admission medications   Medication Sig Start Date End Date Taking? Authorizing Provider  Acetaminophen 500 MG coapsule Take by mouth every 6 (six) hours as needed for fever.   Yes Historical Provider, MD  amitriptyline (ELAVIL) 50 MG tablet TAKE 1 TABLET BY MOUTH AT BEDTIME 05/05/15  Yes Babs SciaraScott A Luking, MD  clonazePAM (KLONOPIN) 1 MG tablet Take 0.5-1 tablets (0.5-1 mg total) by mouth 2 (two) times daily as needed for anxiety. Caution drowsiness 03/28/15  Yes  Babs SciaraScott A Luking, MD  DULoxetine (CYMBALTA) 60 MG capsule Take 1 capsule (60 mg total) by mouth daily. 04/02/15  Yes Babs SciaraScott A Luking, MD  levothyroxine (SYNTHROID, LEVOTHROID) 50 MCG tablet TAKE 1 TABLET BY MOUTH EVERY DAY 02/25/15  Yes Babs SciaraScott A Luking, MD  oxyCODONE 10 MG TABS Take 1-2 tablets (10-20 mg total) by mouth every 4 (four) hours as needed (10mg  for mild pain, 15mg  for moderate pain, 20mg  for severe pain). 02/22/15  Yes Violeta GelinasBurke Thompson, MD  albuterol (PROVENTIL HFA;VENTOLIN HFA) 108 (90 Base) MCG/ACT inhaler Inhale 2 puffs into the lungs every 4 (four) hours as needed for wheezing or shortness of breath. 05/09/15   Charm RingsErin J Sheresa Cullop, MD  amoxicillin-clavulanate (AUGMENTIN) 875-125 MG tablet Take 1 tablet by mouth 2 (two) times daily. 05/09/15   Charm RingsErin J Dayna Geurts, MD  cetirizine (ZYRTEC) 10 MG tablet Take 1 tablet (10 mg total) by mouth daily. 05/09/15   Charm RingsErin J Shade Kaley, MD  fluticasone (FLONASE) 50 MCG/ACT nasal spray Place 2 sprays into both nostrils daily. Patient taking differently: Place 2 sprays into both nostrils daily as needed for allergies.  01/28/15   Charm RingsErin J Esperansa Sarabia, MD  hydrochlorothiazide (HYDRODIURIL) 25 MG tablet TAKE ONE-HALF TO ONE TABLET BY MOUTH EVERY MORNING AS NEEDED FOR SWELLING 07/30/14   Merlyn AlbertWilliam S Luking, MD  HYDROcodone-homatropine [facility](HYCODAN) 5-1.5 MG/5ML syrup Take 5 mLs by mouth every 6 (six) hours as needed for cough. 05/09/15   Charm RingsErin J Alta Goding, MD  KLOR-CON 10 10 MEQ tablet TAKE 1 TABLET BY MOUTH DAILY WHEN TAKING HCTZ 11/11/14   Babs SciaraScott A Luking, MD  pantoprazole (PROTONIX) 40 MG tablet Take 1 tablet (40 mg total) by mouth 2 (two) times daily. 11/11/14   Babs SciaraScott A Luking, MD  predniSONE (DELTASONE) 50 MG tablet Take 1 pill daily for 5 days. 05/09/15   Charm RingsErin J Pranavi Aure, MD  pregabalin (LYRICA) 50 MG capsule Take 1 capsule (50 mg total) by mouth 2 (two) times daily. 04/23/15   Babs SciaraScott A Luking, MD  promethazine (PHENERGAN) 25 MG tablet Take 1 tablet (25 mg total) by mouth every 6 (six) hours as needed for  nausea or vomiting. 01/12/15   Shon Batonourtney F Horton, MD  sucralfate (CARAFATE) 1 GM/10ML suspension TAKE 10 MLS (1 G TOTAL) BY MOUTH 4 (FOUR) TIMES DAILY - WITH MEALS AND AT BEDTIME. 11/11/14   Babs SciaraScott A Luking, MD   Meds Ordered and Administered this Visit   Medications  ipratropium-albuterol (DUONEB) 0.5-2.5 (3) MG/3ML nebulizer solution 3 mL (3 mLs Nebulization Given 05/09/15 1715)  methylPREDNISolone acetate (DEPO-MEDROL) injection 80 mg (80 mg Intramuscular Given 05/09/15 1739)    BP 111/71 mmHg  Pulse 91  Temp(Src) 98.1 F (36.7 C) (Oral)  Resp 16  SpO2 98%  LMP 05/04/2015 (Exact Date) No data found.   Physical Exam  Constitutional: She is oriented to person, place, and time. She appears well-developed and well-nourished. No distress.  HENT:  Mouth/Throat: No  oropharyngeal exudate.  TMs normal bilaterally. Nasal discharge present. She does have clear postnasal drainage. No oropharyngeal erythema.  Cardiovascular: Normal rate, regular rhythm and normal heart sounds.   No murmur heard. Pulmonary/Chest: Effort normal and breath sounds normal. No respiratory distress. She has no wheezes. She has no rales.  Lymphadenopathy:    She has cervical adenopathy (bilateral shotty).  Neurological: She is alert and oriented to person, place, and time.    ED Course  Procedures (including critical care time)  Labs Review Labs Reviewed - No data to display  Imaging Review No results found.    MDM   1. Upper respiratory infection    Had an extensive discussion with patient regarding symptoms and diagnosis. Discussed that at this time she has a upper respiratory infection. She may have some very early bronchitis given her sputum production. Will treat with Depo-Medrol here and 5 additional days of prednisone. Discussed that many of her symptoms are coming from inflammation of the sinuses rather than infection itself. The steroid will relieve the inflammation. Also recommended daily  cetirizine to help with the drainage. Prescription given for Hycodan to use as needed for cough. I did refill her albuterol prescription to use as needed for cough, wheezing, or shortness of breath. A prescription was given for Augmentin to be filled on Monday if she is not improving.    Charm Rings, MD 05/09/15 [phone]

## 2015-05-09 NOTE — Telephone Encounter (Signed)
Rx prior auth APPROVED for pt's pregabalin (LYRICA) 50 MG capsule, valid 04/29/15-04/23/16 thorugh Medicaid, [facility] [facility]# [id number]

## 2015-05-09 NOTE — ED Provider Notes (Signed)
CSN: [id number]     Arrival date & time 05/09/15  1503 History   First MD Initiated Contact with Patient 05/09/15 1627     Chief Complaint  Patient presents with  . URI  . Cough   (Consider location/radiation/quality/duration/timing/severity/associated sxs/prior Treatment) HPI Pt presents with Upper resp symptoms for the last several days. Symptoms include cough, ear ache, sore throat, sinus pain and pressure, sputum production.  States she must have bronchitis or sinus infection.  States she has not taken any meds OTC because of history of ulcer (unable to take NSAIDS) Smokes 1 ppd.  Past Medical History  Diagnosis Date  . Interstitial cystitis   . Thyroid disease   . Hypothyroidism   . Chronic lower back pain   . GI bleed   . Anxiety   . Depression   . Hypertension   . History of kidney stones   . GERD (gastroesophageal reflux disease)   . Pneumonia ~ 2008  . Anemia   . History of blood transfusion 04/2014    "related to duodenal ulcer"  . History of duodenal ulcer 04/2014    "perforated"  . Daily headache     "related to stress and sinus" (02/20/2015)  . Renal stones    Past Surgical History  Procedure Laterality Date  . Esophagogastroduodenoscopy N/A 04/27/2014    Procedure: ESOPHAGOGASTRODUODENOSCOPY (EGD);  Surgeon: Shirley Friar, MD;  Location: [facility] ENDOSCOPY;  Service: Endoscopy;  Laterality: N/A;  . Esophagogastroduodenoscopy (egd) with propofol N/A 05/03/2014    Procedure: ESOPHAGOGASTRODUODENOSCOPY (EGD) WITH PROPOFOL;  Surgeon: Shirley Friar, MD;  Location: [facility] ENDOSCOPY;  Service: Endoscopy;  Laterality: N/A;  . Laparotomy N/A 05/07/2014    Procedure: EXPLORATORY LAPAROTOMY;  Surgeon: Violeta Gelinas, MD;  Location: [facility] OR;  Service: General;  Laterality: N/A;  . Repair of perforated ulcer N/A 05/07/2014    Procedure: Velna Hatchet DUODENAL ULCER;  Surgeon: Violeta Gelinas, MD;  Location: [facility] OR;  Service: General;  Laterality: N/A;  . Cesarean section  05/13/2004  .  Bladder instillation  2004  . Lithotripsy  2004  . Laparoscopic incisional / umbilical / ventral hernia repair  02/20/2015    IHR w/mesh & LOA  . Tonsillectomy  05/17/2008  . Hernia repair    . Nasal sinus surgery  05/17/2008    "removed bone spurs in my sinus cavity"  . Incisional hernia repair N/A 02/20/2015    Procedure: LAPAROSCOPIC INCISIONAL HERNIA;  Surgeon: Violeta Gelinas, MD;  Location: [facility] OR;  Service: General;  Laterality: N/A;  . Insertion of mesh N/A 02/20/2015    Procedure: INSERTION OF MESH;  Surgeon: Violeta Gelinas, MD;  Location: [facility] OR;  Service: General;  Laterality: N/A;   No family history on file. Social History  Substance Use Topics  . Smoking status: Current Every Day Smoker -- 1.50 packs/day for 20 years  . Smokeless tobacco: Never Used  . Alcohol Use: No   OB History    No data available     Review of Systems Cough, sinus pain and pressure Allergies  Gabapentin; Zofran; and Opana  Home Medications   Prior to Admission medications   Medication Sig Start Date End Date Taking? Authorizing Provider  Acetaminophen 500 MG coapsule Take by mouth every 6 (six) hours as needed for fever.   Yes Historical Provider, MD  amitriptyline (ELAVIL) 50 MG tablet TAKE 1 TABLET BY MOUTH AT BEDTIME 05/05/15  Yes Babs Sciara, MD  clonazePAM (KLONOPIN) 1 MG tablet Take 0.5-1 tablets (0.5-1 mg total)  by mouth 2 (two) times daily as needed for anxiety. Caution drowsiness 03/28/15  Yes Babs SciaraScott A Luking, MD  DULoxetine (CYMBALTA) 60 MG capsule Take 1 capsule (60 mg total) by mouth daily. 04/02/15  Yes Babs SciaraScott A Luking, MD  levothyroxine (SYNTHROID, LEVOTHROID) 50 MCG tablet TAKE 1 TABLET BY MOUTH EVERY DAY 02/25/15  Yes Babs SciaraScott A Luking, MD  oxyCODONE 10 MG TABS Take 1-2 tablets (10-20 mg total) by mouth every 4 (four) hours as needed (10mg  for mild pain, 15mg  for moderate pain, 20mg  for severe pain). 02/22/15  Yes Violeta GelinasBurke Thompson, MD  albuterol (PROVENTIL HFA;VENTOLIN HFA) 108 (90 BASE)  MCG/ACT inhaler Inhale 2 puffs into the lungs every 6 (six) hours as needed for wheezing or shortness of breath.    Historical Provider, MD  fluticasone (FLONASE) 50 MCG/ACT nasal spray Place 2 sprays into both nostrils daily. Patient taking differently: Place 2 sprays into both nostrils daily as needed for allergies.  01/28/15   Charm RingsErin J Honig, MD  hydrochlorothiazide (HYDRODIURIL) 25 MG tablet TAKE ONE-HALF TO ONE TABLET BY MOUTH EVERY MORNING AS NEEDED FOR SWELLING 07/30/14   Merlyn AlbertWilliam S Luking, MD  KLOR-CON 10 10 MEQ tablet TAKE 1 TABLET BY MOUTH DAILY WHEN TAKING HCTZ 11/11/14   Babs SciaraScott A Luking, MD  pantoprazole (PROTONIX) 40 MG tablet Take 1 tablet (40 mg total) by mouth 2 (two) times daily. 11/11/14   Babs SciaraScott A Luking, MD  pregabalin (LYRICA) 50 MG capsule Take 1 capsule (50 mg total) by mouth 2 (two) times daily. 04/23/15   Babs SciaraScott A Luking, MD  promethazine (PHENERGAN) 25 MG tablet Take 1 tablet (25 mg total) by mouth every 6 (six) hours as needed for nausea or vomiting. 01/12/15   Shon Batonourtney F Horton, MD  sucralfate (CARAFATE) 1 GM/10ML suspension TAKE 10 MLS (1 G TOTAL) BY MOUTH 4 (FOUR) TIMES DAILY - WITH MEALS AND AT BEDTIME. 11/11/14   Babs SciaraScott A Luking, MD   Meds Ordered and Administered this Visit   Medications  ipratropium-albuterol (DUONEB) 0.5-2.5 (3) MG/3ML nebulizer solution 3 mL (3 mLs Nebulization Given 05/09/15 1715)    BP 111/71 mmHg  Pulse 91  Temp(Src) 98.1 F (36.7 C) (Oral)  Resp 16  SpO2 98%  LMP 05/04/2015 (Exact Date) No data found.   Physical Exam NURSES NOTES AND VITAL SIGNS REVIEWED. CONSTITUTIONAL: Well developed, well nourished, no acute distress HEENT: normocephalic, atraumatic, right and left TM's are normal, sinuses without tenderness, Throat: without exudate or redness. EYES: Conjunctiva normal NECK:normal ROM, supple, no adenopathy PULMONARY:No respiratory distress, normal effort, Lungs: few diffuse wheezes no increased work of breathing CARDIOVASCULAR: RRR, no  murmur ABDOMEN: soft, ND, NT, +'ve BS MUSCULOSKELETAL: Normal ROM of all extremities,  SKIN: warm and dry without rash PSYCHIATRIC: Mood and affect, behavior are normal  ED Course  Procedures (including critical care time)  Labs Review Labs Reviewed - No data to display  Imaging Review No results found.   Visual Acuity Review  Right Eye Distance:   Left Eye Distance:   Bilateral Distance:    Right Eye Near:   Left Eye Near:    Bilateral Near:        Duoneb given to patient. Pt states she would like to see another provider.  MDM   1. Upper respiratory infection         Tharon AquasFrank C Patrick, [place]PA 05/09/15 1806

## 2015-05-09 NOTE — Discharge Instructions (Signed)
You have an upper respiratory infection, with maybe a touch of bronchitis. We gave you a steroid shot today. Please take prednisone daily for 5 days, starting tomorrow. Takes cetirizine daily for the next week. Use the Hycodan every 4-6 hours as needed for cough. Use the albuterol every 4-6 hours as needed for coughing, wheezing, or shortness of breath. If things are not improving by Monday, please fill the prescription for Augmentin. Follow-up as needed.

## 2015-05-09 NOTE — ED Notes (Signed)
34 y.o./female presents with possible URI or bronchitis x7 days Symptoms persist of coughing and SOB, No chest pain  Pt has been taking tylenol and there is no relief No acute distress

## 2015-05-19 ENCOUNTER — Encounter: Payer: Self-pay | Admitting: Family Medicine

## 2015-05-22 ENCOUNTER — Other Ambulatory Visit: Payer: Self-pay | Admitting: *Deleted

## 2015-05-22 ENCOUNTER — Telehealth: Payer: Self-pay | Admitting: Family Medicine

## 2015-05-22 MED ORDER — FLUCONAZOLE 150 MG PO TABS: ORAL_TABLET | ORAL | Status: DC

## 2015-05-22 NOTE — Telephone Encounter (Signed)
Pt called requesting refills on her lyrica and is requesting the dosage be increased to 4 times a day. She is also requesting a refill on diflucan. Pt states that she was recently on an antibiotic that urgent care prescribed her. Pt is also requesting a refill on the hydrochlorothiazide.     [facility] [address] [place]

## 2015-05-22 NOTE — Telephone Encounter (Signed)
Pt wants refill on hctz and also to increase lyrica to qid. Pt rx is written for 50mg  one bid but pt states she has been taking it one qid with her oxycodone that she gets from her surgeon. She states she could not walk bc of pain until she started taking the med this way. Last seen sept 2016 for sick visit. Last check up 05/2014.

## 2015-05-22 NOTE — Telephone Encounter (Signed)
Pt having white vaginal discharge and itching. Diflucan 150mg  #2 one po 3 days apart sent to pharm per protocol. Pt notified.

## 2015-05-23 ENCOUNTER — Other Ambulatory Visit: Payer: Self-pay | Admitting: *Deleted

## 2015-05-23 MED ORDER — HYDROCHLOROTHIAZIDE 25 MG PO TABS: ORAL_TABLET | ORAL | Status: DC

## 2015-05-23 MED ORDER — PREGABALIN 50 MG PO CAPS: ORAL_CAPSULE | ORAL | Status: DC

## 2015-05-23 NOTE — Telephone Encounter (Signed)
Discussed with pt. Med sent to pharm.  

## 2015-05-23 NOTE — Telephone Encounter (Signed)
She may have a prescription for Lyrica 50 mg 4 times a day-but if this is rejected by the insurance company she will need to use 100 mg twice daily. Let the patient know that this medication is typically dosed 2 or 3 times per day so therefore a 200 mg dose per day is typically 100 mg every 12 hours. We can try to get it the way she is asking but we do not guarantee insurance company will approve it-(if it is not approved we will not be appealing that dosing we will go 100 mg twice a day) therefore send and 50 mg, 1 4 times a day, #120, 3 refills

## 2015-05-23 NOTE — Telephone Encounter (Signed)
LMRC

## 2015-06-03 ENCOUNTER — Other Ambulatory Visit: Payer: Self-pay | Admitting: Family Medicine

## 2015-06-09 ENCOUNTER — Telehealth: Payer: Self-pay | Admitting: Family Medicine

## 2015-06-09 MED ORDER — CLONAZEPAM 1 MG PO TABS
0.5000 mg | ORAL_TABLET | Freq: Two times a day (BID) | ORAL | Status: DC | PRN
Start: 2015-06-09 — End: 2015-06-16

## 2015-06-09 NOTE — Telephone Encounter (Signed)
She may have a prescription for 15 tablets 1 twice a day when necessary caution drowsiness

## 2015-06-09 NOTE — Telephone Encounter (Signed)
Rx faxed to pharmacy. Patient notified. 

## 2015-06-09 NOTE — Telephone Encounter (Signed)
clonazePAM (KLONOPIN) 1 MG tablet   Pt states she is out of this med and has been for some time She is having panic attacks and is not on the schedule with you  Until next Monday (she thought it was today)   If you can only do 15 or 7 days that's fine but she needs something

## 2015-06-16 ENCOUNTER — Encounter: Payer: Self-pay | Admitting: Family Medicine

## 2015-06-16 ENCOUNTER — Ambulatory Visit (INDEPENDENT_AMBULATORY_CARE_PROVIDER_SITE_OTHER): Payer: Medicaid Other | Admitting: Family Medicine

## 2015-06-16 VITALS — BP 112/72 | Ht 63.0 in | Wt 169.4 lb

## 2015-06-16 DIAGNOSIS — M5431 Sciatica, right side: Secondary | ICD-10-CM | POA: Diagnosis not present

## 2015-06-16 DIAGNOSIS — E038 Other specified hypothyroidism: Secondary | ICD-10-CM

## 2015-06-16 DIAGNOSIS — F419 Anxiety disorder, unspecified: Secondary | ICD-10-CM

## 2015-06-16 DIAGNOSIS — M5432 Sciatica, left side: Secondary | ICD-10-CM | POA: Diagnosis not present

## 2015-06-16 MED ORDER — CLONAZEPAM 0.5 MG PO TABS: ORAL_TABLET | ORAL | Status: DC

## 2015-06-16 MED ORDER — PREGABALIN 100 MG PO CAPS: ORAL_CAPSULE | ORAL | Status: DC

## 2015-06-16 MED ORDER — OXYCODONE-ACETAMINOPHEN 10-325 MG PO TABS
1.0000 | ORAL_TABLET | Freq: Four times a day (QID) | ORAL | Status: DC | PRN
Start: 2015-06-16 — End: 2015-07-15

## 2015-06-16 NOTE — Progress Notes (Signed)
Subjective:    Patient ID: Kimberly Weiss, female    DOB: 1980/07/02, 35 y.o.   MRN: [id number]  Back Pain This is a chronic problem. The current episode started more than 1 year ago. The problem occurs constantly. The problem has been gradually worsening since onset. The pain is present in the lumbar spine. The quality of the pain is described as burning and aching. The pain radiates to the right knee, right foot and left knee. The pain is at a severity of 8/10. The pain is severe. The pain is worse during the day. The symptoms are aggravated by sitting, twisting and bending. Stiffness is present all day. Associated symptoms include leg pain, numbness and tingling. Pertinent negatives include no bladder incontinence, bowel incontinence, chest pain or paresis. She has tried analgesics, heat, home exercises, muscle relaxant and NSAIDs for the symptoms. The treatment provided no relief.  Anxiety Presents for follow-up visit. Onset was 1 to 5 years ago. The problem has been unchanged. Symptoms include irritability, nervous/anxious behavior, panic and restlessness. Patient reports no chest pain, confusion, malaise, muscle tension, nausea or suicidal ideas. Symptoms occur most days. The severity of symptoms is mild. The quality of sleep is fair. Nighttime awakenings: occasional.   Past treatments include benzodiazephines and non-SSRI antidepressants. The treatment provided moderate relief. Compliance with prior treatments has been good.   Patient arrives to discuss medications.   Review of Systems  Constitutional: Positive for irritability.  Cardiovascular: Negative for chest pain.  Gastrointestinal: Negative for nausea and bowel incontinence.  Genitourinary: Negative for bladder incontinence.  Musculoskeletal: Positive for back pain.  Neurological: Positive for tingling and numbness.  Psychiatric/Behavioral: Negative for suicidal ideas and confusion. The patient is nervous/anxious.          Objective:   Physical Exam  Constitutional: She appears well-nourished. No distress.  HENT:  Head: Normocephalic.  Cardiovascular: Normal rate, regular rhythm and normal heart sounds.   No murmur heard. Pulmonary/Chest: Effort normal and breath sounds normal.  Musculoskeletal: She exhibits no edema.  Lymphadenopathy:    She has no cervical adenopathy.  Neurological: She is alert.  Psychiatric: Her behavior is normal.  Vitals reviewed. Patient with significant pain discomfort in her back down both legs worse on the right than the left positive straight leg raise limited range of motion        Assessment & Plan:  1. Other specified hypothyroidism Hypothyroidism check TSH continue medication.  2. Bilateral sciatica I am sympathetic to this patient's situation she was with the pain clinic. She states that she tried to follow everything they stated but for some reason problems occurred she was released. We will review over the records from the pain clinic I have told her that Percocet 10 mg/325 mg one 4 times a day is the maximum that I will do. She was given a prescription for 120 and we did do a urine drug testing and the patient will follow-up here in 4 weeks time. She may use Tylenol when necessary but in limited quantities. No NSAIDs because of stomach related issues. Lyrica we will increase that to 100 mg 3 times a day she states 50 was helping but needs a higher dose we will be trying to get her in with a back specialist as well as a pain specialist - Ambulatory referral to Neurosurgery  3. Chronic anxiety I told the patient that we cannot treat both pain and anxiety to the degree that she is wishing for. I recommend for  anxiety related issues reducing Klonopin use 0.5 mg twice a day she was warned how taking no medication pain medications together especially at bedtime increases her risk of accidental overdose and death

## 2015-06-16 NOTE — Patient Instructions (Signed)
Narcotic medication treatment agreement-educational material and consent form. Your health problem-because you are having problems with pain you are being prescribed narcotic medication to help control your pain. The purpose of narcotic medication-narcotics are a type of drug that should help you with your pain and let you be more active in your daily life. It is not expected that your pain will go away completely. There are risks associated with these drugs and you can also have side effects. It is important for you to be honest with your doctor about your pain and the dose of medication you are taking. It is also important to be honest with your doctor about any potential problems or side effects with the medications that you are having. Risk and common problems-narcotic use has been associated with the following issues Addiction- there is a chance that you could become addicted to narcotic drugs. This means that you once the drug and will try very hard to get it, even if it causes you harm or other problems in your life. This chance is greater in people who are young, have mental illness, have been addicted to any drug in the past, or have a close relative that has been addicted to a drug in the past. Your doctor may tell you that you need tests or should see other health providers to help you avoid addiction. Allergic reaction - all kinds of allergic reactions can happen. You could have a minor reaction such as a rash or severe reaction such as swelling of your tongue or throat. A severe reaction as a medical emergency that can cause death. Incomplete relief of pain - narcotic medications do not take away all of your pain. Your doctor will work with you to try to optimize treatment but it is not reasonable to expect complete relief of pain. Low testosterone levels in men - narcotic drugs may cause the levels of the hormone testosterone to drop in men. This could change your mood and energy level. It may  also lessen the desire to have sex. Testosterone supplementation in this situation is not recommended. Physical dependence- you may not feel well if your dose is suddenly stopped. Some common symptoms of withdraw are runny nose, excessive yawning, goosebumps, nausea with stomach pains, diarrhea, body aches, and increased irritability. Side effects- there are many side effects of narcotic drugs. Constipation, nausea, vomiting, itching, dizziness are all potential side effects. Slowed breathing- excessive doses of narcotics can slow your breathing. Do not use other drugs or drink alcohol while taking narcotic drugs. This can cause accidental death. Follow directions on how the medication is prescribed. If you feel you are having problems then notify your doctor. Slowed reaction time- you may feel sleepy and be slow to react. If this happens, then you should not drive, use heavy machinery or guns, or be at unsafe heights, or be caring for someone else. Tolerance- your body could become use to the dose of narcotic drugs that your doctor tells you to take, and you may not get the same relief of pain that you had before. A higher dose may not help and could cause potential side effects. Increased risk of accidental death can occur due to the narcotic medication or side effects. If you are having any of the problems listed above you need to discuss these with your physician.  Other choices- you do not have to take narcotics. The decision to take narcotics for pain his ureters. There are other choices that you may choose.   There are several alternatives that may be helpful. These can be done in place of narcotics or in some cases along with narcotics. - Anti-inflammatories, antidepressants, and seizure medications can be helpful -Physical therapy, wearing a brace, surgical referral, home exercise regimens, could potentially help -Referral to a specialist-you may wish to see a specialist who specializes in pain  management -You can choose to do nothing and live with the pain you have -Your doctor will discuss with you your choices but the decision his ureters. How well any other treatment works will depend on your specific health problem. More facts-there may be local, steak, or federal laws that your doctor must follow with prescribing narcotic drugs. It is not clear if narcotic painkillers are good for you to take for a long period of time. You should discuss with your doctor often about the good and bad effects that these drugs may have on you.  You should not take narcotic drugs if you are pregnant. If you become pregnant notify your doctor right away. Narcotic drugs can raise a chance of having a miscarriage or having a baby born with a birth defect. Your baby can also be born addicted to the drug. Should you become pregnant you will have to discontinue narcotic use. Treatment agreement - by signing the consent form you agree that you understand the rules for taking narcotic drugs. If you do not follow these rules, then your doctor may refer you to a specialist, no longer prescribe pain medications for you, and release you/terminate you from his or her care. Drug safety-you must lock your drugs in a safe place. They must be kept away from children. We will also were review with you the right way to get rid of any extra drugs.  You may not sell, share, or let other people use your drugs. This is a crime and can cause overdoses. You may be asked to come to our office between scheduled appointments for random pill counts. Failure to comply with this will result in dismissal. Instructions for taking narcotic drugs-you are only to take pain medications that is prescribed by our office. Do not combine your pain medication with other physician narcotic pain medications or other peoples pain medications. Do not stop taking your narcotic suddenly.  Do not drive after a new pain drug is started or after a dose is  increased until you are sure it does not make you sleepy or confused. Do not try to cut or crush your drug unless told to do so. This could cause death. Your drug will be stopped if it is not helping enough or if it is showing signs of harm to you. You must tell your doctor about any new drugs or health problems. Your drug may not work well or may work differently if you have certain health problems.  You must tell your doctor about problems you have with any drugs prescribed or illegal. If you feel you're having an addiction problem with prescribed or illegal drugs you will discuss this with your doctor in order to be referred for treatment.  Your doctor reserves the right to limit other medications that can interact with pain medications. Prescriptions and refills- your narcotic drugs will be prescribed by our office only. You may not ask for pain drugs from any other doctors including emergency room doctors. Our office will decide how many refills you will be given and how often he will need to be seen in our office in order to get   them area at the very least every 3 month appointments are required. Never try to change a prescription. If you do this then it will be reported to the police. You will also be terminated from the practice. Your prescription will not be replaced if lost, stolen, or destroyed by accident. Patients on regular narcotics must keep their office visits on a regular basis-always every 3 months or less. It is at that appointment they will receive their prescriptions. Do not call for an additional month supply. An office visit is necessary. Appointments- you will keep all appointments with doctors, therapist, and counselors. If you miss your scheduled appointments often, then your doctor may slowly decrease your dose of narcotic drugs until you are no longer taking it. The dates when your prescription was filled at the pharmacy will be per 5. The state wide database will be checked at  standard visits to make sure the patient is not receiving pain prescriptions from other physicians.  Random drug testing for illegal substances will be standard. Your doctor may have you give urine, blood, hair, or saliva to run tests. If you have test results that are not normal then your doctor may slowly decrease your dose of narcotic drugs until you are no longer taking the medication or stop prescribing additional pain medications immediately. Refusal to do urine drug testing is basis for the practice no longer prescribe narcotic pain medications. Pain management specialist If your provider feels it is in your best interest to see a pain medicine specialist we will advise you have such and help you with the referral. Sometimes this referral is made because standard dosing of short acting medication is no longer keeping the patient's pain under reasonable control. Sometimes this is based upon a patient's health issue, and it is felt that pain management would best serve the patient's needs.  Once under the care of pain management we will no longer be prescribing the narcotic medications. This will be under the guidance of the pain management specialist. Further pain medication prescriptions will not be reassumed by this office once referred to pain management. In these situations we will continue to provide primary care but not pain medications.  Violations of the pain management agreement will result in our office no longer prescribing pain medications. In some situations it will also result in dismissal of the patient from our practice.  Health information-your doctor may need to discuss your treatment with pharmacists or other providers. Legal authorities may ask for your pain treatment records. If this happens then records will be given to them up on proper documentation/release.  You should also be aware that properly taking pain medications is very important in regards to operating a vehicle.  Even with following proper prescriptions instructions it is possible to be charged with operating a vehicle under influence. It is highly important that if you feel drowsy or drug that you do not operate a motor vehicle for the safety of yourself and others.  

## 2015-06-23 ENCOUNTER — Other Ambulatory Visit: Payer: Self-pay | Admitting: Family Medicine

## 2015-06-25 ENCOUNTER — Encounter: Payer: Self-pay | Admitting: Family Medicine

## 2015-07-06 ENCOUNTER — Telehealth: Payer: Self-pay | Admitting: Family Medicine

## 2015-07-06 NOTE — Telephone Encounter (Signed)
[facility] to [facility] the patient I would recommend for this [facility] from [facility]surgery regarding her back. Please discuss this with the patient if she is in ingredients of doing this then the next step would be having the referral proceed forward have the nurses put in the referral if need be thank you

## 2015-07-09 ENCOUNTER — Telehealth: Payer: Self-pay | Admitting: Family Medicine

## 2015-07-09 NOTE — Telephone Encounter (Signed)
Called pt to explain that Dr. Venetia MaxonStern declined to see her and that Dr. Lorin PicketScott had recommended that [facility] Pt immediately states that she had requested a referral to Dr. Channing Muttersoy, so I then explained that Dr. Channing Muttersoy will need a current MRI to review before his office will schedule an appointment, also explained to pt that we may or may not have enough clinical information to get an MRI approved with her Medicaid coverage  Told pt I would forward this information to Dr. Lorin PicketScott and would be in touch with her as soon as I knew the next step, pt states she has an appointment with Dr. Lorin PicketScott on 07/15/15  Please advise on referral - ? Pain management or neurosurgery

## 2015-07-11 ENCOUNTER — Other Ambulatory Visit: Payer: Self-pay | Admitting: Family Medicine

## 2015-07-14 NOTE — Telephone Encounter (Signed)
Will discuss further when patient follows up

## 2015-07-15 ENCOUNTER — Encounter: Payer: Self-pay | Admitting: Family Medicine

## 2015-07-15 ENCOUNTER — Ambulatory Visit (INDEPENDENT_AMBULATORY_CARE_PROVIDER_SITE_OTHER): Payer: Medicaid Other | Admitting: Family Medicine

## 2015-07-15 VITALS — BP 128/82 | Ht 63.0 in | Wt 174.2 lb

## 2015-07-15 DIAGNOSIS — N760 Acute vaginitis: Secondary | ICD-10-CM

## 2015-07-15 DIAGNOSIS — B9689 Other specified bacterial agents as the cause of diseases classified elsewhere: Secondary | ICD-10-CM

## 2015-07-15 DIAGNOSIS — F411 Generalized anxiety disorder: Secondary | ICD-10-CM | POA: Diagnosis not present

## 2015-07-15 DIAGNOSIS — M5431 Sciatica, right side: Secondary | ICD-10-CM

## 2015-07-15 DIAGNOSIS — Z79891 Long term (current) use of opiate analgesic: Secondary | ICD-10-CM | POA: Diagnosis not present

## 2015-07-15 DIAGNOSIS — M5432 Sciatica, left side: Secondary | ICD-10-CM

## 2015-07-15 DIAGNOSIS — Z113 Encounter for screening for infections with a predominantly sexual mode of transmission: Secondary | ICD-10-CM

## 2015-07-15 DIAGNOSIS — A499 Bacterial infection, unspecified: Secondary | ICD-10-CM

## 2015-07-15 DIAGNOSIS — E038 Other specified hypothyroidism: Secondary | ICD-10-CM

## 2015-07-15 MED ORDER — METRONIDAZOLE 500 MG PO TABS: 500.0000 mg | ORAL_TABLET | Freq: Three times a day (TID) | ORAL | Status: DC

## 2015-07-15 MED ORDER — PREGABALIN 100 MG PO CAPS: ORAL_CAPSULE | ORAL | Status: DC

## 2015-07-15 MED ORDER — OXYCODONE-ACETAMINOPHEN 10-325 MG PO TABS: 1.0000 | ORAL_TABLET | Freq: Four times a day (QID) | ORAL | Status: DC | PRN
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
## 2015-07-16 ENCOUNTER — Encounter: Payer: Self-pay | Admitting: Family Medicine

## 2015-07-16 LAB — TSH: TSH: 1.82 u[IU]/mL (ref 0.450–4.500)

## 2015-07-17 LAB — GC/CHLAMYDIA PROBE AMP
Chlamydia trachomatis, NAA: NEGATIVE
Neisseria gonorrhoeae by PCR: NEGATIVE

## 2015-07-17 LAB — PLEASE NOTE

## 2015-07-22 ENCOUNTER — Telehealth: Payer: Self-pay | Admitting: *Deleted

## 2015-07-22 ENCOUNTER — Ambulatory Visit (HOSPITAL_COMMUNITY)
Admission: RE | Admit: 2015-07-22 | Discharge: 2015-07-22 | Disposition: A | Discharge status: Home / Self Care | Payer: Medicaid Other | Source: Ambulatory Visit | Attending: Family Medicine | Admitting: Family Medicine

## 2015-07-22 DIAGNOSIS — M1288 Other specific arthropathies, not elsewhere classified, other specified site: Secondary | ICD-10-CM | POA: Insufficient documentation

## 2015-07-22 DIAGNOSIS — M5126 Other intervertebral disc displacement, lumbar region: Secondary | ICD-10-CM | POA: Diagnosis not present

## 2015-07-22 DIAGNOSIS — M5432 Sciatica, left side: Secondary | ICD-10-CM | POA: Diagnosis present

## 2015-07-22 DIAGNOSIS — M4807 Spinal stenosis, lumbosacral region: Secondary | ICD-10-CM | POA: Insufficient documentation

## 2015-07-22 DIAGNOSIS — M5431 Sciatica, right side: Secondary | ICD-10-CM | POA: Insufficient documentation

## 2015-07-22 NOTE — Telephone Encounter (Signed)
LYRICA 100mg  # 90 one capsule TID approved thru [employer]- Prior auth number [id number]- Authorization valid 07/16/15-01/12/16. Pharmacy notified.

## 2015-07-23 ENCOUNTER — Encounter: Payer: Self-pay | Admitting: Family Medicine

## 2015-07-23 LAB — TOXASSURE SELECT 13 (MW), URINE

## 2015-07-30 ENCOUNTER — Telehealth: Payer: Self-pay | Admitting: Family Medicine

## 2015-07-30 NOTE — Telephone Encounter (Signed)
Fax rec'd - Dr. Channing Muttersoy declined to take pt  Please advise

## 2015-07-30 NOTE — Telephone Encounter (Signed)
There are no other options locally. The next step would be if the patient desires is referral to [facility] to the neurosurgery department. You can talk with the patient. If she would rather discuss it further at her follow-up office visit later in June that would be fine her option

## 2015-08-04 NOTE — Telephone Encounter (Signed)
[facility] is fine as far as near surgery goes. His far as pain management it is fine to refer anywhere you thank you can get her in. The reason I mention [facility] is they are pretty much willing to see anybody to help them out

## 2015-08-04 NOTE — Telephone Encounter (Signed)
Called pt to explain that Dr. Channing Weiss declined to take her as a patient and that Dr. Lorin Weiss recommends pt [facility] a [facility] & that [facility] (Patient very confused as to why Dr. Channing Weiss declined to see her, explained that I have no idea)  Patient states that's a long drive, she's requesting that I send out referrals to a few different places to see who can see her first ([facility] than [facility] or [place] branch of Kimberly Weiss's office & tried to explain that I couldn't guarantee a different doc in same group would agree to take her case)  Then pt started asking me to refer her back to a specific doctor at [facility] (pretty sure patient was discharged from there)  Really at a loss as to what to do for her.  Patient states she needs pain management until she can see neurosurgeon  Please advise

## 2015-08-06 ENCOUNTER — Encounter: Payer: Self-pay | Admitting: Family Medicine

## 2015-08-18 ENCOUNTER — Ambulatory Visit (INDEPENDENT_AMBULATORY_CARE_PROVIDER_SITE_OTHER): Payer: Medicaid Other | Admitting: Family Medicine

## 2015-08-18 ENCOUNTER — Encounter: Payer: Self-pay | Admitting: Family Medicine

## 2015-08-18 VITALS — BP 128/84 | Ht 63.0 in | Wt 170.0 lb

## 2015-08-18 DIAGNOSIS — M5432 Sciatica, left side: Secondary | ICD-10-CM | POA: Diagnosis not present

## 2015-08-18 DIAGNOSIS — Z79891 Long term (current) use of opiate analgesic: Secondary | ICD-10-CM | POA: Diagnosis not present

## 2015-08-18 DIAGNOSIS — M5431 Sciatica, right side: Secondary | ICD-10-CM

## 2015-08-18 DIAGNOSIS — R52 Pain, unspecified: Secondary | ICD-10-CM

## 2015-08-18 DIAGNOSIS — Z5189 Encounter for other specified aftercare: Secondary | ICD-10-CM | POA: Diagnosis not present

## 2015-08-18 MED ORDER — OXYCODONE-ACETAMINOPHEN 10-325 MG PO TABS: 1.0000 | ORAL_TABLET | Freq: Four times a day (QID) | ORAL | Status: DC | PRN
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
## 2015-08-19 ENCOUNTER — Other Ambulatory Visit: Payer: Self-pay | Admitting: Family Medicine

## 2015-08-19 NOTE — Telephone Encounter (Signed)
May have this +6 refills 

## 2015-08-24 LAB — TOXASSURE SELECT 13 (MW), URINE: PDF: 0

## 2015-09-16 ENCOUNTER — Encounter: Payer: Self-pay | Admitting: Family Medicine

## 2015-09-16 ENCOUNTER — Ambulatory Visit (INDEPENDENT_AMBULATORY_CARE_PROVIDER_SITE_OTHER): Payer: Medicaid Other | Admitting: Family Medicine

## 2015-09-16 VITALS — BP 110/72 | Ht 63.0 in | Wt 170.0 lb

## 2015-09-16 DIAGNOSIS — R52 Pain, unspecified: Secondary | ICD-10-CM

## 2015-09-16 DIAGNOSIS — N922 Excessive menstruation at puberty: Secondary | ICD-10-CM

## 2015-09-16 DIAGNOSIS — Z5189 Encounter for other specified aftercare: Secondary | ICD-10-CM | POA: Diagnosis not present

## 2015-09-16 DIAGNOSIS — N920 Excessive and frequent menstruation with regular cycle: Secondary | ICD-10-CM | POA: Diagnosis not present

## 2015-09-16 DIAGNOSIS — Z79891 Long term (current) use of opiate analgesic: Secondary | ICD-10-CM | POA: Diagnosis not present

## 2015-09-16 MED ORDER — OXYCODONE-ACETAMINOPHEN 10-325 MG PO TABS: 1.0000 | ORAL_TABLET | Freq: Four times a day (QID) | ORAL | Status: DC | PRN

## 2015-09-16 NOTE — Progress Notes (Signed)
   Subjective:    Patient ID: Kimberly Weiss, female    DOB: 08/11/1980, 35 y.o.   MRN: [id number]  HPI This patient was seen today for chronic pain.   The medication list was reviewed and updated.   -Compliance with medication: yes  - Number patient states they take daily: 4 a day  -when was the last dose patient took? today  The patient was advised the importance of maintaining medication and not using illegal substances with these.  Refills needed: yes  The patient was educated that we can provide 3 monthly scripts for their medication, it is their responsibility to follow the instructions.  Side effects or complications from medications: pt states med is not controlling the pain. Wants to increase to one every 4 hours instead of one every 6 hours.   Patient is aware that pain medications are meant to minimize the severity of the pain to allow their pain levels to improve to allow for better function. They are aware of that pain medications cannot totally remove their pain.  Due for UDT ( at least once per year) : done at last visit.    patient denies abusing medication she states she's using an help her with her pain and does take the edge off.    patient relates heavy cycles   Review of Systems  Constitutional: Negative for fever, activity change and fatigue.  Respiratory: Negative for cough and shortness of breath.   Cardiovascular: Negative for chest pain and leg swelling.  Musculoskeletal: Positive for back pain.  Neurological: Negative for headaches.    patient having menorrhagia I recommend that she see gynecology    Objective:   Physical Exam  Constitutional: She appears well-nourished. No distress.  HENT:  Head: Normocephalic.  Cardiovascular: Normal rate, regular rhythm and normal heart sounds.   No murmur heard. Pulmonary/Chest: Effort normal and breath sounds normal.  Musculoskeletal: She exhibits no edema.  Lymphadenopathy:    She has no cervical  adenopathy.  Neurological: She is alert.  Psychiatric: Her behavior is normal.  Vitals reviewed.         Assessment & Plan:   chronic back and cervical pain patient [facility] hopefully they can help her release give her an opinion  They will not be doing pain management  Patient understands we are at the max the medication that we can prescribed for her. She agrees to do another urine drug screen today. She will follow-up in one month.

## 2015-09-24 LAB — TOXASSURE SELECT 13 (MW), URINE: PDF: 0

## 2015-09-30 ENCOUNTER — Telehealth: Payer: Self-pay | Admitting: Family Medicine

## 2015-09-30 NOTE — Telephone Encounter (Signed)
Dr. Lorin Picket to see and decide per Dr Synetta Fail was notified that Dr Lorin Picket would see message when he returned

## 2015-09-30 NOTE — Telephone Encounter (Signed)
Pt called stating that she is going out of town the 16th and is needing an appt for her pain medication before then. Pt has an appt for the 18th but states that she will be out of town then. Pt is wanting a nurse to call her back about possibly working her in. I explained to her that he had no open slots, but she insisted that I have a nurse call her back. Please advise.

## 2015-10-01 NOTE — Telephone Encounter (Signed)
May have appt moved to the 15th (best I can do)

## 2015-10-14 ENCOUNTER — Ambulatory Visit (HOSPITAL_COMMUNITY)
Admission: RE | Admit: 2015-10-14 | Discharge: 2015-10-14 | Disposition: A | Discharge status: Home / Self Care | Payer: Medicaid Other | Source: Ambulatory Visit | Attending: Family Medicine | Admitting: Family Medicine

## 2015-10-14 ENCOUNTER — Ambulatory Visit (INDEPENDENT_AMBULATORY_CARE_PROVIDER_SITE_OTHER): Payer: Medicaid Other | Admitting: Family Medicine

## 2015-10-14 DIAGNOSIS — M79631 Pain in right forearm: Secondary | ICD-10-CM

## 2015-10-14 DIAGNOSIS — Z5189 Encounter for other specified aftercare: Secondary | ICD-10-CM | POA: Diagnosis not present

## 2015-10-14 DIAGNOSIS — S5001XA Contusion of right elbow, initial encounter: Secondary | ICD-10-CM

## 2015-10-14 DIAGNOSIS — L03113 Cellulitis of right upper limb: Secondary | ICD-10-CM | POA: Diagnosis not present

## 2015-10-14 DIAGNOSIS — M25531 Pain in right wrist: Secondary | ICD-10-CM | POA: Diagnosis not present

## 2015-10-14 DIAGNOSIS — M25521 Pain in right elbow: Secondary | ICD-10-CM

## 2015-10-14 DIAGNOSIS — R52 Pain, unspecified: Secondary | ICD-10-CM

## 2015-10-14 MED ORDER — CEPHALEXIN 500 MG PO CAPS: 500.0000 mg | ORAL_CAPSULE | Freq: Four times a day (QID) | ORAL | 0 refills | Status: DC

## 2015-10-14 MED ORDER — FLUCONAZOLE 150 MG PO TABS: 150.0000 mg | ORAL_TABLET | Freq: Once | ORAL | 4 refills | Status: AC

## 2015-10-14 MED ORDER — OXYCODONE-ACETAMINOPHEN 10-325 MG PO TABS: 1.0000 | ORAL_TABLET | ORAL | 0 refills | Status: DC | PRN

## 2015-10-14 MED ORDER — OXYCODONE-ACETAMINOPHEN 10-325 MG PO TABS: 1.0000 | ORAL_TABLET | Freq: Four times a day (QID) | ORAL | 0 refills | Status: DC | PRN

## 2015-10-14 NOTE — Progress Notes (Signed)
   Subjective:    Patient ID: Toy CookeyKatherine N Labate, female    DOB: 03/21/1980, 35 y.o.   MRN: [id number]  HPI Patient arrives for pain management- currently taking oxycodone 4 times a day but feels she needs to be increased to 6 times a day. Patient had accident over weekend and is having severe pain in right arm from elbow down to wrist. Patient relates severe pain discomfort in the right arm. She states she's been taking the medication every 4 hours because of this. She does have a little redness around the abrasion. Tenderness in the forearm wrist elbow Patient feels that she need to be on higher dose of medication because of recent injury. PMH benign Review of Systems Relates elbow pain wrist pain forearm pain    Objective:   Physical Exam Lungs clear heart regular shoulder mouth tenderness elbow severe tenderness limited range of motion significant tenderness in the forearm a little bit in the wrist not severe and movement normal color normal pulses normal abrasion noted on elbow       Assessment & Plan:  Stat x-rays ordered await the results  X-ray results were negative for fracture Patient was given in one week prescription for oxycodone 6 times a day to help with the severe pain She was given a one-month prescription filled the week after on the 23rd for oxycodone maximum 4 times per day She is to follow-up in a proximally 5 weeks.  She was instructed to do gentle range of motion exercises on a regular basis. If she does not see significant improvement with this notify us and we will help set her up with orthopedics for further evaluation and physical therapy. Antibiotic prescribed for the localized infection

## 2015-10-17 ENCOUNTER — Ambulatory Visit: Payer: Medicaid Other | Admitting: Family Medicine

## 2015-10-28 ENCOUNTER — Telehealth: Payer: Self-pay | Admitting: Family Medicine

## 2015-10-28 NOTE — Telephone Encounter (Signed)
Notified patient should be okay as long as she is not having any active infections or fevers. Patient verbalized understanding.

## 2015-10-28 NOTE — Telephone Encounter (Signed)
Should be okay as long as she is not having any active infections or fevers

## 2015-10-28 NOTE — Telephone Encounter (Signed)
Pt wants to know if it is safe for her to start donating plasma again. Please advise.

## 2015-10-30 ENCOUNTER — Other Ambulatory Visit: Payer: Self-pay | Admitting: Family Medicine

## 2015-10-31 ENCOUNTER — Telehealth: Payer: Self-pay | Admitting: Family Medicine

## 2015-10-31 MED ORDER — PREDNISONE 20 MG PO TABS: ORAL_TABLET | ORAL | 0 refills | Status: DC

## 2015-10-31 NOTE — Telephone Encounter (Signed)
Pt given very significant amnt of narcotics just sixteen days ago, can add adult pred taper, can f u with dr Lorin Picketscott

## 2015-10-31 NOTE — Telephone Encounter (Signed)
Pt called stating that she believes that she has blown out another disk in the middle of her spine and that nothing she is taking it helping with the pain. Pt also states that her arm is still not right. Pt is aware that Lorin PicketScott is out till tues.

## 2015-11-04 NOTE — Telephone Encounter (Signed)
Spoke with patient and informed her per Dr.Scott Luking- She is on the maximum of the oxycodone that I'm going to prescribe. Dr.Scott Luking will not be adding medications in terms of additional narcotic pain medicines. The patient needs to make this last and to make do. If she would like to be seen for follow-up regarding evaluation of the pain in the arm Dr.Scott is fine to do that that if the patient feels she needs to be on greater amount of pain medicine she will need to be seen and managed at a pain management center.  Once again  Dr.Scott  will be happy to see her in follow-up but we will not be changing are narcotic pain regimen to a higher regimen. Patient verbalized understanding.

## 2015-11-04 NOTE — Telephone Encounter (Signed)
She is on the maximum of the oxycodone that I'm going to prescribe. I will not be adding medications in terms of additional narcotic pain medicines. The patient needs to make this last and to make do. If she would like to be seen for follow-up regarding evaluation of the pain in the arm I am fine to do that that if the patient feels she needs to be on greater amount of pain medicine she will need to be seen and managed at a pain management center. This patient has Arty gone through multiple pain management centers. It may be quite difficult for her to get in with one. Once again I will be happy to see her in follow-up but we will not be changing are narcotic pain regimen to a higher regimen.

## 2015-11-13 ENCOUNTER — Encounter: Payer: Self-pay | Admitting: Family Medicine

## 2015-11-14 ENCOUNTER — Encounter: Payer: Self-pay | Admitting: Family Medicine

## 2015-11-14 ENCOUNTER — Ambulatory Visit (INDEPENDENT_AMBULATORY_CARE_PROVIDER_SITE_OTHER): Payer: Medicaid Other | Admitting: Family Medicine

## 2015-11-14 ENCOUNTER — Ambulatory Visit (HOSPITAL_COMMUNITY)
Admission: RE | Admit: 2015-11-14 | Discharge: 2015-11-14 | Disposition: A | Discharge status: Home / Self Care | Payer: Medicaid Other | Source: Ambulatory Visit | Attending: Family Medicine | Admitting: Family Medicine

## 2015-11-14 ENCOUNTER — Ambulatory Visit (HOSPITAL_COMMUNITY): Admission: RE | Admit: 2015-11-14 | Payer: Medicaid Other | Source: Ambulatory Visit

## 2015-11-14 ENCOUNTER — Encounter (HOSPITAL_COMMUNITY): Payer: Self-pay

## 2015-11-14 VITALS — BP 112/68 | Ht 63.0 in | Wt 171.4 lb

## 2015-11-14 DIAGNOSIS — M79672 Pain in left foot: Secondary | ICD-10-CM | POA: Insufficient documentation

## 2015-11-14 DIAGNOSIS — Z5189 Encounter for other specified aftercare: Secondary | ICD-10-CM

## 2015-11-14 DIAGNOSIS — R52 Pain, unspecified: Secondary | ICD-10-CM

## 2015-11-14 MED ORDER — OXYCODONE-ACETAMINOPHEN 10-325 MG PO TABS: 1.0000 | ORAL_TABLET | Freq: Four times a day (QID) | ORAL | 0 refills | Status: DC | PRN

## 2015-11-14 NOTE — Progress Notes (Signed)
   Subjective:    Patient ID: Kimberly Weiss, female    DOB: 09/09/1980, [age] y.o.   MRN: [id number]  HPI Patient arrives with c/o left foot pain for few days. A can food item fell out of cart and hit her foot.  Patient currently taking Oxycodone 10/325mg  four times a day  But states that it is not helping and she needs to go to 6 times a day like it was originally supposed to be prescribed.   Review of Systems Patient denies any chest tightness pressure pain shortness of breath patient does relate foot pain    Objective:   Physical Exam On physical exam she does have a bruise of the toe along with a small cut does not appear to be infected rest of the foot appears normal Lungs clear heart regular Subjective low back pain with sciatica symptoms       Assessment & Plan:  Patient wanted to go on a higher amount of pain medicine I am not going to go higher. One tablet 4 times daily 2 prescriptions were given follow-up within 2 months x-ray the foot ordered.  Patient was told that if she feels we are not doing adequate job for controlling her pain we will refer her to a pain management center.  This patient already on Effexor which works like Cymbalta. She also takes Lyrica.  [facility]. I have informed the patient that I will not be prescribing greater than the amount that I am currently prescribing as a primary care doctor I do not feel comfortable going above this. Patient reluctantly understands. Follow-up in 2 months

## 2015-11-17 ENCOUNTER — Ambulatory Visit: Payer: Medicaid Other | Admitting: Family Medicine

## 2015-11-17 ENCOUNTER — Telehealth: Payer: Self-pay | Admitting: Family Medicine

## 2015-11-17 ENCOUNTER — Encounter: Payer: Self-pay | Admitting: Family Medicine

## 2015-11-17 NOTE — Telephone Encounter (Signed)
Pt called checking on the status of her prior authorization on her pain medication. Pt is wanting a call back regarding this as soon as possible.

## 2015-11-17 NOTE — Telephone Encounter (Signed)
Called and spoke with patient and informed her that we are in the process of working on the  prior Authorization for medication however it takes a few days and we are at the mercy of patient's insurance for prior Authorization. Patient verbalized understanding.

## 2015-11-17 NOTE — Telephone Encounter (Signed)
I will discuss this with the nurses who do the pre-authorizations they should be calling you.this is part of Medicaid new rules

## 2015-11-17 NOTE — Addendum Note (Signed)
Addended by: Theodora BlowREWS, Emilyann Banka R on: 11/17/2015 08:07 AM   Modules accepted: Orders

## 2015-11-18 ENCOUNTER — Encounter: Payer: Self-pay | Admitting: Family Medicine

## 2015-11-18 ENCOUNTER — Telehealth: Payer: Self-pay | Admitting: Family Medicine

## 2015-11-18 NOTE — Telephone Encounter (Signed)
Please see Opioid Analgesic Prior Approval form from [place] for patient.  Form in yellow folder in Dr.Scott Luking's office.

## 2015-11-19 ENCOUNTER — Encounter: Payer: Self-pay | Admitting: Family Medicine

## 2015-11-19 NOTE — Telephone Encounter (Signed)
Form faxed

## 2015-11-19 NOTE — Telephone Encounter (Signed)
Form completed please send in an

## 2015-11-21 ENCOUNTER — Telehealth: Payer: Self-pay | Admitting: Family Medicine

## 2015-11-21 ENCOUNTER — Telehealth: Payer: Self-pay | Admitting: *Deleted

## 2015-11-21 NOTE — Telephone Encounter (Signed)
I am open to any pain management opportunities for this patient

## 2015-11-21 NOTE — Telephone Encounter (Signed)
Prior authorization request for OXYCODONE/APAP 10/325mg  was APPROVED. Approval # [id number]. Approval good 11/20/15-05/18/16.  Pharmacy notified.

## 2015-11-21 NOTE — Telephone Encounter (Signed)
[facility] does not have a pain management department that I am able to find information on    Please advise  (FYI - I was finally able to get pt[facility])

## 2015-11-23 ENCOUNTER — Other Ambulatory Visit: Payer: Self-pay | Admitting: Family Medicine

## 2015-11-26 ENCOUNTER — Encounter: Payer: Self-pay | Admitting: Family Medicine

## 2015-12-02 ENCOUNTER — Encounter: Payer: Self-pay | Admitting: Family Medicine

## 2015-12-17 ENCOUNTER — Other Ambulatory Visit: Payer: Self-pay | Admitting: Family Medicine

## 2015-12-24 ENCOUNTER — Encounter: Payer: Self-pay | Admitting: Family Medicine

## 2015-12-25 ENCOUNTER — Other Ambulatory Visit: Payer: Self-pay | Admitting: Family Medicine

## 2015-12-25 NOTE — Telephone Encounter (Signed)
It is possible that this was handled as part of the my chart request please see notes regarding that. If it wasn't handled she may have this +2 additional refills

## 2015-12-25 NOTE — Telephone Encounter (Signed)
The patient may have a prescription for her refill +2 additional refill-these prescriptions are 30 days at a time. Please check with the pharmacy so that they dispense this 30 days at a time. I will not go up on the dose because this is a national approach toward minimizing nerve pills with patients who are on any type of pain medicines. If the patient feels her issues or beyond what this medicine is helping I recommend seeing a psychiatrist for counseling and possible adjustments of antidepressants but increase amount of nerve pills is absolutely no wrong thing to do

## 2016-01-12 ENCOUNTER — Ambulatory Visit (INDEPENDENT_AMBULATORY_CARE_PROVIDER_SITE_OTHER): Payer: Medicaid Other | Admitting: Family Medicine

## 2016-01-12 ENCOUNTER — Encounter: Payer: Self-pay | Admitting: Family Medicine

## 2016-01-12 VITALS — BP 110/80 | Ht 63.0 in | Wt 171.0 lb

## 2016-01-12 DIAGNOSIS — M5431 Sciatica, right side: Secondary | ICD-10-CM

## 2016-01-12 DIAGNOSIS — M5432 Sciatica, left side: Secondary | ICD-10-CM | POA: Diagnosis not present

## 2016-01-12 MED ORDER — OXYCODONE-ACETAMINOPHEN 10-325 MG PO TABS: 1.0000 | ORAL_TABLET | Freq: Four times a day (QID) | ORAL | 0 refills | Status: DC | PRN

## 2016-01-12 MED ORDER — LEVOTHYROXINE SODIUM 50 MCG PO TABS: 50.0000 ug | ORAL_TABLET | Freq: Every day | ORAL | 5 refills | Status: DC

## 2016-01-12 MED ORDER — DULOXETINE HCL 60 MG PO CPEP: 60.0000 mg | ORAL_CAPSULE | Freq: Every day | ORAL | 5 refills | Status: DC

## 2016-01-12 NOTE — Patient Instructions (Signed)

## 2016-01-12 NOTE — Progress Notes (Addendum)
   Subjective:    Patient ID: Kimberly Weiss, female    DOB: 08/12/1980, 35 y.o.   MRN: [id number]  HPI This patient was seen today for chronic pain  The medication list was reviewed and updated.   -Compliance with medication: yes  - Number patient states they take daily: 4 times daily   -when was the last dose patient took: today  The patient was advised the importance of maintaining medication and not using illegal substances with these.  Refills needed: yes  The patient was educated that we can provide 3 monthly scripts for their medication, it is their responsibility to follow the instructions.  Side effects or complications from medications: none  Patient is aware that pain medications are meant to minimize the severity of the pain to allow their pain levels to improve to allow for better function. They are aware of that pain medications cannot totally remove their pain.  Due for UDT ( at least once per year) : up to date    Patient wants pain medicine increased to 6 times daily. Patient is in constant pain.     Review of Systems  Constitutional: Negative for activity change and appetite change.  Gastrointestinal: Negative for abdominal pain and vomiting.  Neurological: Negative for weakness.  Psychiatric/Behavioral: Negative for confusion.       Objective:   Physical Exam  Constitutional: She appears well-nourished. No distress.  HENT:  Head: Normocephalic.  Cardiovascular: Normal rate, regular rhythm and normal heart sounds.   No murmur heard. Pulmonary/Chest: Effort normal and breath sounds normal.  Musculoskeletal: She exhibits no edema.  Lymphadenopathy:    She has no cervical adenopathy.  Neurological: She is alert.  Psychiatric: Her behavior is normal.  Vitals reviewed.         Assessment & Plan:  This patient is having significant low back pain and discomfort. She did not tolerate Lyrica. Did not tolerate gabapentin. 10 mg oxycodone 4 per day  helps some but not does not help a lot She would like to go back to be seen by pain management. We will try to help get her back in a pain management. The patient has been coming year for the past 9 months and has done a good job being compliant with her visits and medications. Her urine drug screen has been legitimate.  2 additional prescriptions written for the patient today she will follow-up in early January She also states that she will be following up with [facility] for their opinion as well as regarding if she would benefit from any type of surgery

## 2016-01-12 NOTE — Addendum Note (Signed)
Addended by: Dereck LigasJOHNSON, Orey Moure P on: 01/12/2016 10:35 AM   Modules accepted: Orders

## 2016-01-19 ENCOUNTER — Encounter: Payer: Self-pay | Admitting: Family Medicine

## 2016-01-22 ENCOUNTER — Encounter: Payer: Self-pay | Admitting: Family Medicine

## 2016-01-25 NOTE — Telephone Encounter (Signed)
Nurse's-please inform the patient that thyroid medication adjustments are based around TSH results. Her last TSH in May showed that she was under very good control. Because of the patient's current symptoms I recommend checking TSH and free T4-any adjustments will be made after receiving the results of this.

## 2016-02-04 DIAGNOSIS — Z0289 Encounter for other administrative examinations: Secondary | ICD-10-CM

## 2016-02-12 ENCOUNTER — Encounter: Payer: Self-pay | Admitting: Family Medicine

## 2016-02-12 NOTE — Telephone Encounter (Signed)
From a medical care standpoint patient will need to be seen before prescription for antibiotics

## 2016-02-12 NOTE — Telephone Encounter (Signed)
I believe it is important for this patient to understand the reason we are doing this is to ensure good medical care rather than treating it blindly. If she would rather do and E visit that is available through my chart with one of that E visit providers

## 2016-02-13 ENCOUNTER — Telehealth: Payer: Self-pay | Admitting: Pediatrics

## 2016-02-13 NOTE — Telephone Encounter (Signed)
I contacted patient in repsonse to MyChart message.  She stated in message that she would take leftover Metronidazole for 3 days and pray that it helped.  I spoke with her and advised that it is not a good idea to take antibiotics that are leftover from another illness.  I advised her specific antibiotics treat specific illness.  I advised her she should go to urgent care or have an e-visit on MyChart.  She voiced understanding and agreed with plan.

## 2016-03-04 ENCOUNTER — Ambulatory Visit: Payer: Medicaid Other | Admitting: Family Medicine

## 2016-03-09 ENCOUNTER — Encounter: Payer: Self-pay | Admitting: Family Medicine

## 2016-03-10 ENCOUNTER — Ambulatory Visit: Payer: Medicaid Other | Admitting: Family Medicine

## 2016-03-10 ENCOUNTER — Ambulatory Visit (INDEPENDENT_AMBULATORY_CARE_PROVIDER_SITE_OTHER): Payer: Medicaid Other | Admitting: Family Medicine

## 2016-03-10 ENCOUNTER — Encounter: Payer: Self-pay | Admitting: Family Medicine

## 2016-03-10 VITALS — BP 110/70 | Temp 98.2°F | Ht 63.0 in | Wt 167.2 lb

## 2016-03-10 DIAGNOSIS — H6503 Acute serous otitis media, bilateral: Secondary | ICD-10-CM

## 2016-03-10 MED ORDER — SULFAMETHOXAZOLE-TRIMETHOPRIM 800-160 MG PO TABS: 1.0000 | ORAL_TABLET | Freq: Two times a day (BID) | ORAL | 0 refills | Status: DC

## 2016-03-10 NOTE — Progress Notes (Signed)
   Subjective:    Patient ID: Kimberly Weiss, female    DOB: 03/31/1980, 36 y.o.   MRN: [id number]  Otalgia   There is pain in both ears. This is a new problem. The current episode started in the past 7 days. Associated symptoms include coughing, headaches and vomiting. Associated symptoms comments: fever. She has tried acetaminophen and NSAIDs (heat) for the symptoms.   Positive cough and congestion and dranage   Now left ear hurting very bad  Now right ear  Sinuses hurting  Dizzy at times and nausea  Dim enrgy  Some resp infxns in the familu  Pos smokeing    Some off an on not high      Review of Systems  HENT: Positive for ear pain.   Respiratory: Positive for cough.   Gastrointestinal: Positive for vomiting.  Neurological: Positive for headaches.       Objective:   Physical Exam  alert mild malaise positive nasl congetion bilateral otitis media. Pharynx normal lun clear heart regular in rhythm       Assessment & Plan:   impression bilateral otitis meia with rhinosiusitis patient asked to give her pn medcaion already on very hih-dose narctics. I declined plan antibiotics prescried symptom aediscussed wrningsis discussed

## 2016-03-12 ENCOUNTER — Ambulatory Visit (INDEPENDENT_AMBULATORY_CARE_PROVIDER_SITE_OTHER): Payer: Medicaid Other | Admitting: Family Medicine

## 2016-03-12 ENCOUNTER — Encounter: Payer: Self-pay | Admitting: Family Medicine

## 2016-03-12 VITALS — BP 110/70 | Ht 63.0 in | Wt 171.0 lb

## 2016-03-12 DIAGNOSIS — E039 Hypothyroidism, unspecified: Secondary | ICD-10-CM

## 2016-03-12 DIAGNOSIS — R52 Pain, unspecified: Secondary | ICD-10-CM

## 2016-03-12 DIAGNOSIS — H6503 Acute serous otitis media, bilateral: Secondary | ICD-10-CM

## 2016-03-12 MED ORDER — LEVOTHYROXINE SODIUM 50 MCG PO TABS: 50.0000 ug | ORAL_TABLET | Freq: Every day | ORAL | 5 refills | Status: DC

## 2016-03-12 MED ORDER — OXYCODONE-ACETAMINOPHEN 10-325 MG PO TABS: 1.0000 | ORAL_TABLET | Freq: Four times a day (QID) | ORAL | 0 refills | Status: DC | PRN

## 2016-03-12 NOTE — Progress Notes (Signed)
   Subjective:    Patient ID: Kimberly Weiss, female    DOB: 09/13/1980, 36 y.o.   MRN: [id number]  HPI This patient was seen today for chronic pain  The medication list was reviewed and updated.   -Compliance with medication: yes  - Number patient states they take daily: 4 daily   -when was the last dose patient took: today   The patient was advised the importance of maintaining medication and not using illegal substances with these.  Refills needed: yes  The patient was educated that we can provide 3 monthly scripts for their medication, it is their responsibility to follow the instructions.  Side effects or complications from medications: none  Patient is aware that pain medications are meant to minimize the severity of the pain to allow their pain levels to improve to allow for better function. They are aware of that pain medications cannot totally remove their pain.  Due for UDT ( at least once per year) : completed, utd  Patient wants her ears rechecked for infection.   Patient recently diagnosed with ear infection would like her ears rechecked   Review of Systems She denies high fever chills sweats nausea vomiting diarrhea. She didn't she states compliance with her pain medicine denies abusing it. States it does a fair job controlling her discomfort    Objective:   Physical Exam Lungs clear hearts regular ears are normal extremities no edema skin warm dry       Assessment & Plan:  One month's worth of pain medicine was given She will be seen a pain clinic in the coming weeks If they take this over we will not be prescribing further pain medicine She will give us an update after she sees pain medicine doctor  Recent upper respiratory illness ear infection seems to be improving continue with current medicine  Do thyroid testing because a hyperthyroidism

## 2016-03-13 LAB — TSH: TSH: 6.77 u[IU]/mL — ABNORMAL HIGH (ref 0.450–4.500)

## 2016-03-15 ENCOUNTER — Ambulatory Visit: Payer: Medicaid Other | Admitting: Family Medicine

## 2016-03-19 MED ORDER — LEVOTHYROXINE SODIUM 75 MCG PO TABS: 75.0000 ug | ORAL_TABLET | Freq: Every day | ORAL | 1 refills | Status: DC

## 2016-03-19 NOTE — Addendum Note (Signed)
Addended by: Margaretha SheffieldBROWN, AUTUMN S on: 03/19/2016 04:26 PM   Modules accepted: Orders

## 2016-03-22 DIAGNOSIS — M5136 Other intervertebral disc degeneration, lumbar region: Secondary | ICD-10-CM | POA: Diagnosis not present

## 2016-03-22 DIAGNOSIS — M47816 Spondylosis without myelopathy or radiculopathy, lumbar region: Secondary | ICD-10-CM | POA: Diagnosis not present

## 2016-03-22 DIAGNOSIS — G894 Chronic pain syndrome: Secondary | ICD-10-CM | POA: Diagnosis not present

## 2016-03-23 ENCOUNTER — Telehealth: Payer: Self-pay | Admitting: Family Medicine

## 2016-03-23 DIAGNOSIS — F1190 Opioid use, unspecified, uncomplicated: Secondary | ICD-10-CM

## 2016-03-23 NOTE — Telephone Encounter (Signed)
Pt last seen 03/19/16 for pain management. Please advise rx or OV?

## 2016-03-23 NOTE — Telephone Encounter (Signed)
Pt called stating that she found out last night that her husband that she has been seperated from since 2014 passed away. Pt states that she is very upset and her nerves are bad due to this. Pt is wanting to know if she can be prescribed anything to help with this. Please advise.

## 2016-03-24 NOTE — Telephone Encounter (Signed)
Pt wanted to let you know that she sees neurosurgery tomorrow. And also she wants referral to pain mananagement at [facility] in [place] on [address]. She states they are closer to her.

## 2016-03-24 NOTE — Telephone Encounter (Signed)
Left message to return call 

## 2016-03-24 NOTE — Telephone Encounter (Signed)
This patient currently is on oxycodone and she is already on a nerve pill clonazepam twice a day. There is no additional nerve pill that can be added to this that would would be safe. I would recommend consideration for the patient to do some counseling. She can be referred for counseling via mental health

## 2016-03-24 NOTE — Telephone Encounter (Signed)
Please give referral as requested 

## 2016-03-24 NOTE — Telephone Encounter (Signed)
Discussed with pt. Pt verbalized understanding.  °

## 2016-03-25 ENCOUNTER — Other Ambulatory Visit (HOSPITAL_COMMUNITY): Payer: Self-pay | Admitting: Nurse Practitioner

## 2016-03-25 DIAGNOSIS — M5126 Other intervertebral disc displacement, lumbar region: Secondary | ICD-10-CM

## 2016-03-26 ENCOUNTER — Ambulatory Visit: Payer: Medicaid Other | Admitting: Physical Therapy

## 2016-03-30 ENCOUNTER — Encounter: Payer: Self-pay | Admitting: Family Medicine

## 2016-04-01 ENCOUNTER — Ambulatory Visit: Payer: Medicaid Other | Attending: Nurse Practitioner | Admitting: Physical Therapy

## 2016-04-01 DIAGNOSIS — M6281 Muscle weakness (generalized): Secondary | ICD-10-CM | POA: Insufficient documentation

## 2016-04-01 DIAGNOSIS — R293 Abnormal posture: Secondary | ICD-10-CM | POA: Insufficient documentation

## 2016-04-01 DIAGNOSIS — M5416 Radiculopathy, lumbar region: Secondary | ICD-10-CM | POA: Diagnosis not present

## 2016-04-01 NOTE — Patient Instructions (Signed)
Double Knee to Chest (Flexion)    Gently pull both knees toward chest. Feel stretch in lower back or buttock area. Breathing deeply, Hold ___30_ seconds. Repeat __3__ times. Do ___2_ sessions per day.  http://gt2.exer.us/228   Copyright  VHI. All rights reserved.  Knee to Chest (Flexion)    Pull knee toward chest. Feel stretch in lower back or buttock area. Breathing deeply, Hold __30__ seconds. Repeat with other knee. Repeat _3___ times. Do __2__ sessions per day.  http://gt2.exer.us/226   Copyright  VHI. All rights reserved.    Lower Trunk Rotation Stretch    Keeping back flat and feet together, rotate knees to left side. Hold ___10-15_ seconds. Repeat __10__ times per set. Do __2__ sets per session. Do __2_ sessions per day.  http://orth.exer.us/123   Copyright  VHI. All rights reserved.    (Home) Flexion: Pelvic Tilt    Lie with neck supported, knees bent, feet flat. Tighten and suck stomach in, pushing back down against surface. Do not push down with legs. Repeat ___10_ times per set. Do ___1_ sets per session. Do ___2_ sessions per day  Copyright  VHI. All rights reserved.    Abdominal Bracing With Pelvic Floor (Hook-Lying)    With neutral spine, tighten pelvic floor and abdominals. Repeat _10__ times. Do _5__ times a day. Transverse Abdominus isometric   Copyright  VHI. All rights reserved.    Posture Tips DO: - stand tall and erect - keep chin tucked in - keep head and shoulders in alignment - check posture regularly in mirror or large window - pull head back against headrest in car seat;  Change your position often.  Sit with lumbar support. DON'T: - slouch or slump while watching TV or reading - sit, stand or lie in one position  for too long;  Sitting is especially hard on the spine so if you sit at a desk/use the computer, then stand up often!   Copyright  VHI. All rights reserved.  Posture - Standing   Good posture is important. Avoid  slouching and forward head thrust. Maintain curve in low back and align ears over shoul- ders, hips over ankles.  Pull your belly button in toward your back bone.   Copyright  VHI. All rights reserved.  Posture - Sitting   Sit upright, head facing forward. Try using a roll to support lower back. Keep shoulders relaxed, and avoid rounded back. Keep hips level with knees. Avoid crossing legs for long periods.   Copyright  VHI. All rights reserved.    Reducing Load   Copyright  VHI. All rights reserved.  BODY MECHANICS Tips Good body mechanics are important during activities of daily living. The practice of good body mechanics will: -help distribute weight throughout the skeleton in a more anatomically correct manner thus stimulating more normal forces on the bones, and encouraging stronger, healthier, denser bones. -reduce unnatural forces on bones, ligaments, joints and muscles and reduce risk of fracture, other injury or back pain. A WORD ON BODY POSITIONING: Sitting is the hardest position for the back. Lying on the back is the easiest. Standing, in good body alignment, is somewhere between. A good motto is: Sit less, stand more, and, when you can't do that, lie down on your back and exercise to strengthen it.  Copyright  VHI. All rights reserved.       Supine to Sit (Active)   Lie on back, left leg bent. Roll to other side. From side-lying, sit up on side of bed.  Complete ___ sets of ___ repetitions. Perform ___ sessions per day.  Copyright  VHI. All rights reserved.    Housework - Reaching Down   If you are unable to bend your knees or squat, use a lazy Darl Pikes to keep items within easy reach. Store only light, unbreakable items on the lowest shelves, and use a reacher to pick them up.  Copyright  VHI. All rights reserved.  Low Shelf   Squat down, and bring item close to lift.   Copyright  VHI. All rights reserved.  Lifting Principles .Maintain proper  posture and head alignment. .Slide object as close as possible before lifting. .Move obstacles out of the way. .Test before lifting; ask for help if too heavy. .Tighten stomach muscles without holding breath. .Use smooth movements; do not jerk. .Use legs to do the work, and pivot with feet. .Distribute the work load symmetrically and close to the center of trunk. .Push instead of pull whenever possible.   Reducing Load   Copyright  VHI. All rights reserved.  BODY MECHANICS Tips Good body mechanics are important during activities of daily living. The practice of good body mechanics will: -help distribute weight throughout the skeleton in a more anatomically correct manner thus stimulating more normal forces on the bones, and encouraging stronger, healthier, denser bones. -reduce unnatural forces on bones, ligaments, joints and muscles and reduce risk of fracture, other injury or back pain. A WORD ON BODY POSITIONING: Sitting is the hardest position for the back. Lying on the back is the easiest. Standing, in good body alignment, is somewhere between. A good motto is: Sit less, stand more, and, when you can't do that, lie down on your back and exercise to strengthen it.  Copyright  VHI. All rights reserved.       Supine to Sit (Active)   Lie on back, left leg bent. Roll to other side. From side-lying, sit up on side of bed. Complete ___ sets of ___ repetitions. Perform ___ sessions per day.  Copyright  VHI. All rights reserved.    Housework - Reaching Down   If you are unable to bend your knees or squat, use a lazy Darl Pikes to keep items within easy reach. Store only light, unbreakable items on the lowest shelves, and use a reacher to pick them up.  Copyright  VHI. All rights reserved.  Low Shelf   Squat down, and bring item close to lift.   Copyright  VHI. All rights reserved.  Lifting Principles .Maintain proper posture and head alignment. .Slide object as close as  possible before lifting. .Move obstacles out of the way. .Test before lifting; ask for help if too heavy. .Tighten stomach muscles without holding breath. .Use smooth movements; do not jerk. .Use legs to do the work, and pivot with feet. .Distribute the work load symmetrically and close to the center of trunk. .Push instead of pull whenever possible.  Sleeping on Back  Place pillow under knees. A pillow with cervical support and a roll around waist are also helpful. Copyright  VHI. All rights reserved.  Sleeping on Side Place pillow between knees. Use cervical support under neck and a roll around waist as needed. Copyright  VHI. All rights reserved.   Sleeping on Stomach   If this is the only desirable sleeping position, place pillow under lower legs, and under stomach or chest as needed.  Posture - Sitting   Sit upright, head facing forward. Try using a roll to support lower back. Keep shoulders relaxed,  and avoid rounded back. Keep hips level with knees. Avoid crossing legs for long periods. Stand to Sit / Sit to Stand   To sit: Bend knees to lower self onto front edge of chair, then scoot back on seat. To stand: Reverse sequence by placing one foot forward, and scoot to front of seat. Use rocking motion to stand up.   Work Height and Reach  Ideal work height is no more than 2 to 4 inches below elbow level when standing, and at elbow level when sitting. Reaching should be limited to arm's length, with elbows slightly bent.  Bending  Bend at hips and knees, not back. Keep feet shoulder-width apart.    Posture - Standing   Good posture is important. Avoid slouching and forward head thrust. Maintain curve in low back and align ears over shoul- ders, hips over ankles.  Alternating Positions   Alternate tasks and change positions frequently to reduce fatigue and muscle tension. Take rest breaks. Computer Work   Position work to Art gallery managerface forward. Use proper work and seat  height. Keep shoulders back and down, wrists straight, and elbows at right angles. Use chair that provides full back support. Add footrest and lumbar roll as needed.  Getting Into / Out of Car  Lower self onto seat, scoot back, then bring in one leg at a time. Reverse sequence to get out.  Dressing  Lie on back to pull socks or slacks over feet, or sit and bend leg while keeping back straight.    Housework - Sink  Place one foot on ledge of cabinet under sink when standing at sink for prolonged periods.   Pushing / Pulling  Pushing is preferable to pulling. Keep back in proper alignment, and use leg muscles to do the work.  Deep Squat   Squat and lift with both arms held against upper trunk. Tighten stomach muscles without holding breath. Use smooth movements to avoid jerking.  Avoid Twisting   Avoid twisting or bending back. Pivot around using foot movements, and bend at knees if needed when reaching for articles.  Carrying Luggage   Distribute weight evenly on both sides. Use a cart whenever possible. Do not twist trunk. Move body as a unit.   Lifting Principles .Maintain proper posture and head alignment. .Slide object as close as possible before lifting. .Move obstacles out of the way. .Test before lifting; ask for help if too heavy. .Tighten stomach muscles without holding breath. .Use smooth movements; do not jerk. .Use legs to do the work, and pivot with feet. .Distribute the work load symmetrically and close to the center of trunk. .Push instead of pull whenever possible.   Ask For Help   Ask for help and delegate to others when possible. Coordinate your movements when lifting together, and maintain the low back curve.  Log Roll   Lying on back, bend left knee and place left arm across chest. Roll all in one movement to the right. Reverse to roll to the left. Always move as one unit. Housework - Sweeping  Use long-handled equipment to avoid  stooping.   Housework - Wiping  Position yourself as close as possible to reach work surface. Avoid straining your back.  Laundry - Unloading Wash   To unload small items at bottom of washer, lift leg opposite to arm being used to reach.  Gardening - Raking  Move close to area to be raked. Use arm movements to do the work. Keep back straight and avoid twisting.  Cart  When reaching into cart with one arm, lift opposite leg to keep back straight.   Getting Into / Out of Bed  Lower self to lie down on one side by raising legs and lowering head at the same time. Use arms to assist moving without twisting. Bend both knees to roll onto back if desired. To sit up, start from lying on side, and use same move-ments in reverse. Housework - Vacuuming  Hold the vacuum with arm held at side. Step back and forth to move it, keeping head up. Avoid twisting.   Laundry - Armed forces training and education officer so that bending and twisting can be avoided.   Laundry - Unloading Dryer  Squat down to reach into clothes dryer or use a reacher.  Gardening - Weeding / Psychiatric nurse or Kneel. Knee pads may be helpful.

## 2016-04-01 NOTE — Addendum Note (Signed)
Addended by: Karie MainlandPAA, JENNIFER L on: 04/01/2016 03:36 PM   Modules accepted: Orders

## 2016-04-01 NOTE — Therapy (Signed)
[facility] [address] [place], [place], [address] Phone: [phone]   Fax:  [phone]  Physical Therapy Evaluation  Patient Details  Name: Kimberly Weiss MRN: [id number] Date of Birth: [birth year] Referring Provider: Darden Dates, NP   Encounter Date: 04/01/2016      PT End of Session - 04/01/16 1320    Visit Number 1   Number of Visits 1   Date for PT Re-Evaluation 04/01/16   PT Start Time 1145   PT Stop Time 1230   PT Time Calculation (min) 45 min   Activity Tolerance Patient tolerated treatment well   Behavior During Therapy [facility] for tasks assessed/performed      Past Medical History:  Diagnosis Date  . Anemia   . Anxiety   . Chronic lower back pain   . Daily headache    "related to stress and sinus" (02/20/2015)  . Depression   . GERD (gastroesophageal reflux disease)   . GI bleed   . History of blood transfusion 04/2014   "related to duodenal ulcer"  . History of duodenal ulcer 04/2014   "perforated"  . History of kidney stones   . Hypertension   . Hypothyroidism   . Interstitial cystitis   . Pneumonia ~ 2008  . Renal stones   . Thyroid disease     Past Surgical History:  Procedure Laterality Date  . BLADDER INSTILLATION  2004  . CESAREAN SECTION  05/13/2004  . ESOPHAGOGASTRODUODENOSCOPY N/A 04/27/2014   Procedure: ESOPHAGOGASTRODUODENOSCOPY (EGD);  Surgeon: Shirley Friar, MD;  Location: [facility] ENDOSCOPY;  Service: Endoscopy;  Laterality: N/A;  . ESOPHAGOGASTRODUODENOSCOPY (EGD) WITH PROPOFOL N/A 05/03/2014   Procedure: ESOPHAGOGASTRODUODENOSCOPY (EGD) WITH PROPOFOL;  Surgeon: Shirley Friar, MD;  Location: [facility] ENDOSCOPY;  Service: Endoscopy;  Laterality: N/A;  . HERNIA REPAIR    . INCISIONAL HERNIA REPAIR N/A 02/20/2015   Procedure: LAPAROSCOPIC INCISIONAL HERNIA;  Surgeon: Violeta Gelinas, MD;  Location: [facility] OR;  Service: General;  Laterality: N/A;  . INSERTION OF MESH N/A 02/20/2015   Procedure: INSERTION OF MESH;  Surgeon: Violeta Gelinas, MD;  Location: [facility] OR;  Service: General;  Laterality: N/A;  . LAPAROSCOPIC INCISIONAL / UMBILICAL / VENTRAL HERNIA REPAIR  02/20/2015   IHR w/mesh & LOA  . LAPAROTOMY N/A 05/07/2014   Procedure: EXPLORATORY LAPAROTOMY;  Surgeon: Violeta Gelinas, MD;  Location: [facility] OR;  Service: General;  Laterality: N/A;  . LITHOTRIPSY  2004  . NASAL SINUS SURGERY  05/17/2008   "removed bone spurs in my sinus cavity"  . REPAIR OF PERFORATED ULCER N/A 05/07/2014   Procedure: OVERSOWE DUODENAL ULCER;  Surgeon: Violeta Gelinas, MD;  Location: [facility] OR;  Service: General;  Laterality: N/A;  . TONSILLECTOMY  05/17/2008    There were no vitals filed for this visit.       Subjective Assessment - 04/01/16 1140    Subjective This patient presents with pain in lumbar spine, bilateral hips which began in 2013 when she slipped and fell on her back.  She describes pain which is severe and constant in her back, radiating into both hips, legs to her toes.  She states her legs go numb and give out when she stands too long.  She is frustrated with "the system" and has been dealing with pain clinic, chiropractor.  MD referred to Neurosurgeon but they need a new MRI which she cannot get because PT has not been done.  She does have urinary incontinence, bowel every once in awhile.  Pertinent History chronic pain, depression/anxiety, hospitalized in 2016 for ulcer   Limitations Sitting;Lifting;Standing;Walking;House hold activities;Other (comment)  sleeping    How long can you sit comfortably? shifts all the time, never comfortable   How long can you stand comfortably? never    How long can you walk comfortably? never   Diagnostic tests MRI in 2017: L4-L5 disc bulge, mild L mod R foraminal narrowing and foraminal stenosis , spondylolisthesis on    Patient Stated Goals To get my life back    Currently in Pain? Yes   Pain Score 10-Worst pain ever   Pain Location Back   Pain  Orientation Right;Left;Lower   Pain Descriptors / Indicators Aching;Burning;Shooting;Sharp   Pain Type Chronic pain   Pain Radiating Towards both hips, legs, to feet    Pain Onset More than a month ago   Pain Frequency Constant   Aggravating Factors  walking, extension,    Pain Relieving Factors propping on hips in standing, elevating legs   Effect of Pain on Daily Activities unable to sit, stand symmetrically, never comfortable, cannot work             [facility] PT Assessment - 04/01/16 1145      Assessment   Medical Diagnosis lumbar disc herniation    Referring Provider Darden Dates, NP    Onset Date/Surgical Date --  chronic    Prior Therapy No      Precautions   Precautions None     Restrictions   Weight Bearing Restrictions No     Balance Screen   Has the patient fallen in the past 6 months Yes   How many times? 1 x per month    Has the patient had a decrease in activity level because of a fear of falling?  Yes   Is the patient reluctant to leave their home because of a fear of falling?  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 LE >Lt.      Posture/Postural Control   Posture/Postural Control Postural limitations   Postural Limitations Rounded Shoulders;Forward head;Decreased lumbar lordosis;Flexed trunk;Weight shift right;Weight shift left   Posture Comments shifts in sitting mostly to L hip   difficulty getting into full standing ext, knees  flexed      AROM   Lumbar Flexion 15 deg, uses hands to return to neutral    Lumbar Extension 20 deg pain   Lumbar - Right Side Bend NT   Lumbar - Left Side Bend NT   Lumbar - Right Rotation NT   Lumbar - Left Rotation NT      PROM   Overall PROM Comments HIp WNL      Strength   Right Hip Flexion 3-/5   Left Hip Flexion 3-/5   Right Knee Flexion 4-/5   Right Knee Extension 3/5   Left Knee Flexion 4-/5   Left Knee Extension  3/5   Right Ankle Dorsiflexion 3+/5   Left Ankle Dorsiflexion 3/5     Flexibility   Hamstrings 80 deg no incr back pain      Palpation   Spinal mobility NT in prone    Palpation comment TTP grossly in lumbar and hips      Special Tests   Lumbar Tests Slump Test;Straight Leg Raise     Slump test   Findings Positive   Side Right   Comment bilat pos      Straight Leg Raise   Findings Negative   Comment bilat     other   Comments manual traction to each leg caused severe pain      other   Comment knee to chest relieved pain (spinal flexion)      Ambulation/Gait   Ambulation Distance (Feet) 150 Feet   Assistive device None   Gait Pattern Decreased step length - left;Decreased stance time - left;Decreased stride length;Decreased hip/knee flexion - right;Decreased hip/knee flexion - left;Decreased dorsiflexion - right;Decreased dorsiflexion - left;Right flexed knee in stance;Left flexed knee in stance;Antalgic;Trunk flexed                [facility] Adult PT Treatment/Exercise - 04/01/16 1145      Lumbar Exercises: Stretches   Single Knee to Chest Stretch 2 reps;30 seconds   Double Knee to Chest Stretch 2 reps;30 seconds   Lower Trunk Rotation 5 reps   Pelvic Tilt 5 reps;10 seconds     Lumbar Exercises: Supine   Ab Set 10 reps   Clam 15 reps   Heel Slides 5 reps   Bent Knee Raise 10 reps   Other Supine Lumbar Exercises all ex done in hooklying or in 90/90      Cryotherapy   Number Minutes Cryotherapy 10 Minutes   Cryotherapy Location  Lumbar Spine   Type of Cryotherapy Ice pack          PT Education - 04/01/16 1319    Education provided Yes   Education Details PT/POC, HEP, posture and stabilization    Person(s) Educated Patient   Methods Explanation;Demonstration;Handout;Verbal cues;Tactile cues   Comprehension Verbalized understanding;Returned demonstration;Need further instruction          PT Short Term Goals - 04/01/16 1519      PT SHORT TERM GOAL #1   Title Pt will be given HEP and resources for self care and treatment.    Time 1   Period Days   Status Achieved                  Plan - 04/01/16 1322    Clinical Impression Statement Patient presents with severe debilitating low back an radicular leg pain which intereferes with all aspects with mobility and self care.  She has LE weakness, changes in bowel and bladder control and sensory changes.  [facility] will not pay for PT for this diagnosis.  She was given extensive HEP and information regarding back pain, posture, body mechanics, the [facility], [facility] and [facility].  She had less pain today after flexion based exercises and ice pack.     Rehab Potential Good   PT Frequency One time visit   PT Treatment/Interventions ADLs/Self Care Home Management;Patient/family education;Therapeutic exercise;Cryotherapy   PT Next Visit Plan NA    PT Home Exercise Plan Given flexion based stabilization and stretching   Recommended Other Services Orthopedic/Neurosurgeon/pain clinic    Consulted and Agree with Plan of Care Patient  Patient will benefit from skilled therapeutic intervention in order to improve the following deficits and impairments:  Abnormal gait, Decreased activity tolerance, Decreased mobility, Decreased strength, Increased edema, Impaired sensation, Postural dysfunction, Improper body mechanics, Impaired flexibility, Pain, Increased muscle spasms, Increased fascial restricitons, Difficulty walking, Decreased range of  motion, Decreased balance  Visit Diagnosis: Radiculopathy, lumbar region  Muscle weakness (generalized)  Abnormal posture     Problem List Patient Active Problem List   Diagnosis Date Noted  . Bilateral sciatica 06/16/2015  . S/P laparoscopic hernia repair 02/20/2015  . Hypocalcemia 06/03/2014  . Diarrhea 05/16/2014  . Chronic narcotic use 05/16/2014  . Duodenal ulcer with hemorrhage   . Hemorrhagic shock   . Gastrointestinal hemorrhage associated with duodenal ulcer   . GI bleed   . Other specified hypotension   . Duodenal ulcer   . Hypovolemic shock (HCC)   . Tobacco abuse   . Other specified hypothyroidism   . Anxiety state   . Head trauma   . Tachycardia   . UGIB (upper gastrointestinal bleed) 04/27/2014  . Acute blood loss anemia   . Anxiety and depression 11/03/2012  . Dysfunctional uterine bleeding 06/10/2012  . Interstitial cystitis 05/22/2012  . Hypothyroidism 05/22/2012    Tamiki Kuba 04/01/2016, 3:33 PM  [facility] [address] [place], [place], [address] Phone: [phone]   Fax:  [phone]  Name: Toy CookeyKatherine N Appelbaum MRN: [id number] Date of Birth: 02/02/1981   Karie MainlandJennifer Yarixa Lightcap, PT 04/01/16 3:33 PM Phone: [phone] Fax: [phone]

## 2016-04-02 ENCOUNTER — Ambulatory Visit (INDEPENDENT_AMBULATORY_CARE_PROVIDER_SITE_OTHER): Payer: Medicaid Other | Admitting: Family Medicine

## 2016-04-02 ENCOUNTER — Ambulatory Visit (HOSPITAL_COMMUNITY): Payer: Medicaid Other

## 2016-04-02 ENCOUNTER — Encounter: Payer: Self-pay | Admitting: Family Medicine

## 2016-04-02 VITALS — BP 112/74 | Temp 99.0°F | Ht 63.0 in | Wt 172.0 lb

## 2016-04-02 DIAGNOSIS — H6503 Acute serous otitis media, bilateral: Secondary | ICD-10-CM | POA: Diagnosis not present

## 2016-04-02 MED ORDER — AMOXICILLIN-POT CLAVULANATE 875-125 MG PO TABS: 1.0000 | ORAL_TABLET | Freq: Two times a day (BID) | ORAL | 0 refills | Status: DC

## 2016-04-02 MED ORDER — FLUCONAZOLE 150 MG PO TABS: ORAL_TABLET | ORAL | 0 refills | Status: DC

## 2016-04-02 NOTE — Progress Notes (Signed)
   Subjective:    Patient ID: Kimberly Weiss, female    DOB: 05/25/1980, 36 y.o.   MRN: [id number]  Sinusitis  This is a new problem. The current episode started 1 to 4 weeks ago. Associated symptoms include coughing, ear pain, headaches and sinus pressure.    Ongoing congestion. Since the pressure in both ears. Occasional cough. Mild sore throat. Unfortunately still smoking   Review of Systems  HENT: Positive for ear pain and sinus pressure.   Respiratory: Positive for cough.   Neurological: Positive for headaches.       Objective:   Physical Exam  Alert vital stable positive TM retraction positive frontal tenderness pharynx normal neck supple lungs clear heart regular in rhythm      Assessment & Plan:  Impression bilateral otitis media/effusion with element of sinusitis plan antibiotics prescribed symptom care discussed encouraged to stop smoking

## 2016-04-04 ENCOUNTER — Other Ambulatory Visit: Payer: Self-pay | Admitting: Family Medicine

## 2016-04-05 ENCOUNTER — Other Ambulatory Visit: Payer: Self-pay | Admitting: Family Medicine

## 2016-04-05 ENCOUNTER — Encounter: Payer: Self-pay | Admitting: Family Medicine

## 2016-04-06 NOTE — Telephone Encounter (Signed)
May have this +3 refills 

## 2016-04-06 NOTE — Telephone Encounter (Signed)
Kimberly Weiss 

## 2016-04-09 ENCOUNTER — Telehealth: Payer: Self-pay | Admitting: Family Medicine

## 2016-04-09 ENCOUNTER — Ambulatory Visit (INDEPENDENT_AMBULATORY_CARE_PROVIDER_SITE_OTHER): Payer: Medicaid Other | Admitting: Family Medicine

## 2016-04-09 ENCOUNTER — Encounter: Payer: Self-pay | Admitting: Family Medicine

## 2016-04-09 VITALS — BP 130/78 | Temp 98.4°F | Ht 63.0 in | Wt 172.4 lb

## 2016-04-09 DIAGNOSIS — M5431 Sciatica, right side: Secondary | ICD-10-CM | POA: Diagnosis not present

## 2016-04-09 DIAGNOSIS — M5432 Sciatica, left side: Secondary | ICD-10-CM

## 2016-04-09 DIAGNOSIS — J019 Acute sinusitis, unspecified: Secondary | ICD-10-CM | POA: Diagnosis not present

## 2016-04-09 DIAGNOSIS — F1190 Opioid use, unspecified, uncomplicated: Secondary | ICD-10-CM

## 2016-04-09 DIAGNOSIS — B9689 Other specified bacterial agents as the cause of diseases classified elsewhere: Secondary | ICD-10-CM | POA: Diagnosis not present

## 2016-04-09 MED ORDER — OXYCODONE-ACETAMINOPHEN 10-325 MG PO TABS: 1.0000 | ORAL_TABLET | Freq: Four times a day (QID) | ORAL | 0 refills | Status: DC | PRN

## 2016-04-09 NOTE — Progress Notes (Signed)
Subjective:    Patient ID: Kimberly Weiss, female    DOB: 02/28/1981, [age] y.o.   MRN: [id number]  HPI  This patient was seen today for chronic pain  The medication list was reviewed and updated.   -Compliance with medication: yes  - Number patient states they take daily: 4 daily   -when was the last dose patient took: today   The patient was advised the importance of maintaining medication and not using illegal substances with these.  Refills needed: yes  The patient was educated that we can provide 3 monthly scripts for their medication, it is their responsibility to follow the instructions.  Side effects or complications from medications: none  Patient is aware that pain medications are meant to minimize the severity of the pain to allow their pain levels to improve to allow for better function. They are aware of that pain medications cannot totally remove their pain.  Due for UDT ( at least once per year) : utd  Patient is still having bilateral ear pain. Onset several days ago. Patient relates a lot of head congestion drainage coughing sinus pressure denies wheezing difficulty breathing  Patient relates mild to moderate anxiety related issues lost her separated husband recently which disturbed her plus also patient has seen a psychologist recently was diagnosed with PTSD and they will have a specialist seeing her and counseling her  Patient relates ongoing low back pain discomfort that radiates into both legs worse on the left than the right. Patient was seen by neurosurgery at [facility]. They recommended another MRI but Medicaid stated that the patient has to go through 6 weeks of therapy before they will approve it. Medicaid will only improve 1 physical therapy visit. Therefore the patient is doing exercises at home and taking, NaSal anti-inflammatory and pain medications she will follow-up here again in 6 weeks' time if not seeing significant improvement we will push  forward with doing the MRI     Review of Systems Patient relates back pain discomfort radiates into the legs. Patient also relates head congestion drainage coughing sinus pressure    Objective:   Physical Exam Lungs clear hearts regular pulses straight leg raise on left and right worse on the left than the right no weakness detected. Extremities no edema skin warm dry HEENT benign Mild sinus infection noted  Patient does have positive straight leg raise on both sides. Reflexes slightly diminished in the left strength slightly diminished because of probably the pain    Assessment & Plan:  The patient was seen today as part of a comprehensive visit regarding pain control. Patient's compliance with the medication as well as discussion regarding effectiveness was completed. Prescriptions were written. Patient was advised to follow-up in 3 months. The patient was assessed for any signs of severe side effects. The patient was advised to take the medicine as directed and to report to us if any side effect issues. One prescription for pain medicine was given to fill on Saturday another one in 30 days she is to follow-up in 6 weeks  Back pain with leg sciatic stretching excises anti-inflammatories were given to the patient she will follow-up in 6-8 weeks to see if there is any improvement with this. Sciatica is worse on the left than the right. See discussion above.  Sinusitis antibiotics prescribed  Patient has been counseled that the nerve medication we are giving her is the highest dosing we can do. I am concerned that if she tries to  do excessive dosing she runs a risk of accidental overdose. I would prefer for her not to be on both pain medicine and nerve medicine but this patient has severe anxiety issues as well as PTSD plus also has severe pain issues which makes it very difficult to solve this issue

## 2016-04-09 NOTE — Telephone Encounter (Signed)
Patient dropped off a handicap placard to be signed by Dr. Lorin PicketScott.  This is in Dr. Roby LoftsScott's forms basket.  She would like this mailed to her.

## 2016-04-10 DIAGNOSIS — G8929 Other chronic pain: Secondary | ICD-10-CM | POA: Diagnosis not present

## 2016-04-10 DIAGNOSIS — M5441 Lumbago with sciatica, right side: Secondary | ICD-10-CM | POA: Diagnosis not present

## 2016-04-10 DIAGNOSIS — M5442 Lumbago with sciatica, left side: Secondary | ICD-10-CM | POA: Diagnosis not present

## 2016-04-10 DIAGNOSIS — F329 Major depressive disorder, single episode, unspecified: Secondary | ICD-10-CM | POA: Diagnosis not present

## 2016-04-10 NOTE — Telephone Encounter (Signed)
This form was signed thank you 

## 2016-05-31 ENCOUNTER — Ambulatory Visit (INDEPENDENT_AMBULATORY_CARE_PROVIDER_SITE_OTHER): Payer: Medicaid Other | Admitting: Family Medicine

## 2016-05-31 ENCOUNTER — Encounter: Payer: Self-pay | Admitting: Family Medicine

## 2016-05-31 VITALS — BP 118/72 | Ht 63.0 in | Wt 173.0 lb

## 2016-05-31 DIAGNOSIS — M5431 Sciatica, right side: Secondary | ICD-10-CM

## 2016-05-31 DIAGNOSIS — M5432 Sciatica, left side: Secondary | ICD-10-CM

## 2016-05-31 DIAGNOSIS — F1190 Opioid use, unspecified, uncomplicated: Secondary | ICD-10-CM | POA: Diagnosis not present

## 2016-05-31 DIAGNOSIS — E038 Other specified hypothyroidism: Secondary | ICD-10-CM

## 2016-05-31 MED ORDER — OXYCODONE-ACETAMINOPHEN 10-325 MG PO TABS: 1.0000 | ORAL_TABLET | Freq: Four times a day (QID) | ORAL | 0 refills | Status: DC | PRN

## 2016-05-31 NOTE — Progress Notes (Signed)
Subjective:    Patient ID: Kimberly Weiss, female    DOB: 1980/08/01, 36 y.o.   MRN: [id number]  HPI This patient was seen today for chronic pain  The medication list was reviewed and updated.   -Compliance with medication: yes  - Number patient states they take daily: four a day  -when was the last dose patient took? today  The patient was advised the importance of maintaining medication and not using illegal substances with these.  Refills needed: yes  The patient was educated that we can provide 3 monthly scripts for their medication, it is their responsibility to follow the instructions.  Side effects or complications from medications: none  Patient is aware that pain medications are meant to minimize the severity of the pain to allow their pain levels to improve to allow for better function. They are aware of that pain medications cannot totally remove their pain.  Due for UDT ( at least once per year) : last one July 2017        Review of Systems  Constitutional: Negative for activity change and appetite change.  Gastrointestinal: Negative for abdominal pain and vomiting.  Neurological: Negative for weakness.  Psychiatric/Behavioral: Negative for confusion.       Objective:   Physical Exam  Constitutional: She appears well-nourished. No distress.  HENT:  Head: Normocephalic.  Cardiovascular: Normal rate, regular rhythm and normal heart sounds.   No murmur heard. Pulmonary/Chest: Effort normal and breath sounds normal.  Musculoskeletal: She exhibits no edema.  Lymphadenopathy:    She has no cervical adenopathy.  Neurological: She is alert.  Psychiatric: Her behavior is normal.  Vitals reviewed.  25 minutes was spent regarding her pain and regarding the management of it as well as discussion of her recent visit with specialist and her desire to get a further opinion greater than half was spent in discussion and answering questions       Assessment &  Plan:  The patient was seen today as part of a comprehensive visit regarding pain control. Patient's compliance with the medication as well as discussion regarding effectiveness was completed. Prescriptions were written. Patient was advised to follow-up in 3 months. The patient was assessed for any signs of severe side effects. The patient was advised to take the medicine as directed and to report to [place] if any side effect issues.  Hypothyroidism patient takes her medicine regularly we'll recheck TSH may need to adjust the dose  Patient has intermittent pedal edema she uses hydrochlorothiazide and potassium we will go ahead and recheck a kidney function make sure potassium is okay  Patient does have chronic anxiety she is seeing a specialist for counseling and has seen a psychiatrist who diagnosed her with possible PTSD she feels she is doing good on current medications. Denies feeling drowsy with it or drug with it. She currently takes nerve medicine twice a day denies a causing drowsiness and states she does not take it at bedtime. She was warned that taking the medicine with the pain medicine at bedtime can increase risk of accidental overdose. She was also warned that if she ever felt drowsy not to drive or operate machinery and to notify us if any side effects with medicines.  I am sympathetic with this patient in regards to her pain we will go ahead and refer her to neurosurgery to see if they feel there is anything they can do to help her with her back.  She would also like to  go ahead and get a consultation from pain management she states she feels there can be medication that could do better than what she is currently taking. I told her I do not feel comfortable prescribing above this level for her issue but if pain management is willing to handle her issues that would be fine  I have told her that many places will not use stronger levels of pain medicine unless a person is also willing to be  totally away from nerve related medicines. Unfortunately she has problems with both these areas pretty seriously I told her she is at the maximum that I feel comfortable prescribing

## 2016-06-01 LAB — BASIC METABOLIC PANEL
BUN/Creatinine Ratio: 12 (ref 9–23)
BUN: 7 mg/dL (ref 6–20)
CO2: 26 mmol/L (ref 18–29)
Calcium: 9 mg/dL (ref 8.7–10.2)
Chloride: 99 mmol/L (ref 96–106)
Creatinine, Ser: 0.57 mg/dL (ref 0.57–1.00)
GFR calc Af Amer: 138 mL/min/{1.73_m2} (ref >59)
GFR calc non Af Amer: 120 mL/min/{1.73_m2} (ref >59)
Glucose: 80 mg/dL (ref 65–99)
Potassium: 4.6 mmol/L (ref 3.5–5.2)
Sodium: 138 mmol/L (ref 134–144)

## 2016-06-01 LAB — TSH: TSH: 2.75 u[IU]/mL (ref 0.450–4.500)

## 2016-06-07 ENCOUNTER — Telehealth: Payer: Self-pay | Admitting: *Deleted

## 2016-06-07 ENCOUNTER — Emergency Department (HOSPITAL_COMMUNITY)
Admission: EM | Admit: 2016-06-07 | Discharge: 2016-06-07 | Disposition: A | Discharge status: Home / Self Care | Payer: Medicaid Other | Attending: Emergency Medicine | Admitting: Emergency Medicine

## 2016-06-07 ENCOUNTER — Other Ambulatory Visit: Payer: Self-pay | Admitting: *Deleted

## 2016-06-07 ENCOUNTER — Encounter (HOSPITAL_COMMUNITY): Payer: Self-pay | Admitting: *Deleted

## 2016-06-07 ENCOUNTER — Telehealth: Payer: Self-pay | Admitting: Family Medicine

## 2016-06-07 ENCOUNTER — Encounter: Payer: Self-pay | Admitting: Family Medicine

## 2016-06-07 DIAGNOSIS — I10 Essential (primary) hypertension: Secondary | ICD-10-CM | POA: Insufficient documentation

## 2016-06-07 DIAGNOSIS — Z23 Encounter for immunization: Secondary | ICD-10-CM | POA: Diagnosis not present

## 2016-06-07 DIAGNOSIS — S61451A Open bite of right hand, initial encounter: Secondary | ICD-10-CM

## 2016-06-07 DIAGNOSIS — Y92009 Unspecified place in unspecified non-institutional (private) residence as the place of occurrence of the external cause: Secondary | ICD-10-CM | POA: Insufficient documentation

## 2016-06-07 DIAGNOSIS — Y9389 Activity, other specified: Secondary | ICD-10-CM | POA: Diagnosis not present

## 2016-06-07 DIAGNOSIS — E039 Hypothyroidism, unspecified: Secondary | ICD-10-CM | POA: Insufficient documentation

## 2016-06-07 DIAGNOSIS — S51831A Puncture wound without foreign body of right forearm, initial encounter: Secondary | ICD-10-CM | POA: Insufficient documentation

## 2016-06-07 DIAGNOSIS — Y999 Unspecified external cause status: Secondary | ICD-10-CM | POA: Insufficient documentation

## 2016-06-07 DIAGNOSIS — F17200 Nicotine dependence, unspecified, uncomplicated: Secondary | ICD-10-CM | POA: Insufficient documentation

## 2016-06-07 DIAGNOSIS — W540XXA Bitten by dog, initial encounter: Secondary | ICD-10-CM | POA: Diagnosis not present

## 2016-06-07 MED ORDER — OXYCODONE-ACETAMINOPHEN 10-325 MG PO TABS: 1.0000 | ORAL_TABLET | Freq: Four times a day (QID) | ORAL | 0 refills | Status: DC | PRN

## 2016-06-07 MED ORDER — AMOXICILLIN-POT CLAVULANATE 875-125 MG PO TABS: 1.0000 | ORAL_TABLET | Freq: Two times a day (BID) | ORAL | 0 refills | Status: DC

## 2016-06-07 MED ORDER — AMOXICILLIN-POT CLAVULANATE 875-125 MG PO TABS
1.0000 | ORAL_TABLET | Freq: Two times a day (BID) | ORAL | Status: DC
  Administered 2016-06-07: 1 via ORAL
  Filled 2016-06-07: qty 1

## 2016-06-07 MED ORDER — POVIDONE-IODINE 10 % EX SOLN
CUTANEOUS | Status: AC
  Filled 2016-06-07: qty 118

## 2016-06-07 MED ORDER — OXYCODONE-ACETAMINOPHEN 5-325 MG PO TABS
2.0000 | ORAL_TABLET | Freq: Once | ORAL | Status: AC
  Administered 2016-06-07: 2 via ORAL
  Filled 2016-06-07: qty 2

## 2016-06-07 MED ORDER — TETANUS-DIPHTH-ACELL PERTUSSIS 5-2.5-18.5 LF-MCG/0.5 IM SUSP
0.5000 mL | Freq: Once | INTRAMUSCULAR | Status: AC
  Administered 2016-06-07: 0.5 mL via INTRAMUSCULAR
  Filled 2016-06-07: qty 0.5

## 2016-06-07 MED ORDER — PROMETHAZINE HCL 12.5 MG PO TABS
25.0000 mg | ORAL_TABLET | Freq: Once | ORAL | Status: AC
  Administered 2016-06-07: 25 mg via ORAL
  Filled 2016-06-07: qty 2

## 2016-06-07 NOTE — Telephone Encounter (Signed)
Patient walked in with her 3 prescriptions of oxycodone that was written 05/31/16. Patient cut out the prescriptions from the paper and also cut off when the prescriptions could be filled and the pharmacy would not fill the prescriptions. Patient requests the prescriptions be re printed. Consult with Dr Lorin Picket -may reprint prescriptions and date according to last time prescription filled. 3 new scripts given to patient-old scripts shredded

## 2016-06-07 NOTE — ED Notes (Signed)
Scrubbed wound with betadine, placed Telfa non adherent, wrapped with [place]ling.

## 2016-06-07 NOTE — ED Notes (Signed)
Would soaking in NS and betadine

## 2016-06-07 NOTE — ED Triage Notes (Signed)
Pt was at a friends house when a dog got into a fight with another dog, she tried to break it up when she got bit on her right forearm. Bleeding is controlled at this time. Pt is not up to date on her tetanus shot. Pt is unsure if the dog is up to date on his shots.

## 2016-06-07 NOTE — Discharge Instructions (Signed)
Take antibiotics twice a day for the next week Observe dogs for a week. If they have any abnormal behavior, return for rabies vaccinations.  Follow up with your doctor

## 2016-06-07 NOTE — Telephone Encounter (Signed)
Form Completed- Awaiting Signature from Dr. Lorin Picket to Fax.

## 2016-06-07 NOTE — Telephone Encounter (Signed)
As stated 3 new prescriptions were printed, was properly dated based upon [place] drug registry

## 2016-06-07 NOTE — ED Provider Notes (Signed)
[facility]-EMERGENCY DEPT Provider Note   CSN: [id number] Arrival date & time: 06/07/16  1242  By signing my name below, I, Cynda Acres, attest that this documentation has been prepared under the direction and in the presence of Terance Hart, PA-C. Electronically Signed: Cynda Acres, Scribe. 06/07/16. 1:17 PM.  History   Chief Complaint Chief Complaint  Patient presents with  . Animal Bite   HPI Comments: Kimberly Weiss is a 36 y.o. female with a history of anxiety, depression and chronic narcotic use, who presents to the Emergency Department complaining of sudden-onset, constant pain to the right forearm that began earlier today. Patient states she was at a friends house when two dogs (great dane and pit bull) began fighting. Patient attempted to break the dogs up, when she was suddenly bit on the right forearm. Patient is unsure of which dog bit her, but both dogs are up to date on rabies shots. Patient reports associated bleeding and swelling. Dressing was applied in triage, which controlled the bleeding. Last tetanus shot unknown. Patient denies any fever, emesis, chills, or any other symptoms.   The history is provided by the patient. No language interpreter was used.    Past Medical History:  Diagnosis Date  . Anemia   . Anxiety   . Chronic lower back pain   . Daily headache    "related to stress and sinus" (02/20/2015)  . Depression   . GERD (gastroesophageal reflux disease)   . GI bleed   . History of blood transfusion 04/2014   "related to duodenal ulcer"  . History of duodenal ulcer 04/2014   "perforated"  . History of kidney stones   . Hypertension   . Hypothyroidism   . Interstitial cystitis   . Pneumonia ~ 2008  . Renal stones   . Thyroid disease     Patient Active Problem List   Diagnosis Date Noted  . Bilateral sciatica 06/16/2015  . S/P laparoscopic hernia repair 02/20/2015  . Hypocalcemia 06/03/2014  . Diarrhea 05/16/2014  . Chronic narcotic use  05/16/2014  . Duodenal ulcer with hemorrhage   . Hemorrhagic shock   . Gastrointestinal hemorrhage associated with duodenal ulcer   . GI bleed   . Other specified hypotension   . Duodenal ulcer   . Hypovolemic shock (HCC)   . Tobacco abuse   . Other specified hypothyroidism   . Anxiety state   . Head trauma   . Tachycardia   . UGIB (upper gastrointestinal bleed) 04/27/2014  . Acute blood loss anemia   . Anxiety and depression 11/03/2012  . Dysfunctional uterine bleeding 06/10/2012  . Interstitial cystitis 05/22/2012  . Hypothyroidism 05/22/2012    Past Surgical History:  Procedure Laterality Date  . BLADDER INSTILLATION  2004  . CESAREAN SECTION  05/13/2004  . ESOPHAGOGASTRODUODENOSCOPY N/A 04/27/2014   Procedure: ESOPHAGOGASTRODUODENOSCOPY (EGD);  Surgeon: Shirley Friar, MD;  Location: [facility] ENDOSCOPY;  Service: Endoscopy;  Laterality: N/A;  . ESOPHAGOGASTRODUODENOSCOPY (EGD) WITH PROPOFOL N/A 05/03/2014   Procedure: ESOPHAGOGASTRODUODENOSCOPY (EGD) WITH PROPOFOL;  Surgeon: Shirley Friar, MD;  Location: [facility] ENDOSCOPY;  Service: Endoscopy;  Laterality: N/A;  . HERNIA REPAIR    . INCISIONAL HERNIA REPAIR N/A 02/20/2015   Procedure: LAPAROSCOPIC INCISIONAL HERNIA;  Surgeon: Violeta Gelinas, MD;  Location: [facility] OR;  Service: General;  Laterality: N/A;  . INSERTION OF MESH N/A 02/20/2015   Procedure: INSERTION OF MESH;  Surgeon: Violeta Gelinas, MD;  Location: [facility] OR;  Service: General;  Laterality: N/A;  . LAPAROSCOPIC INCISIONAL /  UMBILICAL / VENTRAL HERNIA REPAIR  02/20/2015   IHR w/mesh & LOA  . LAPAROTOMY N/A 05/07/2014   Procedure: EXPLORATORY LAPAROTOMY;  Surgeon: Violeta Gelinas, MD;  Location: [facility] OR;  Service: General;  Laterality: N/A;  . LITHOTRIPSY  2004  . NASAL SINUS SURGERY  05/17/2008   "removed bone spurs in my sinus cavity"  . REPAIR OF PERFORATED ULCER N/A 05/07/2014   Procedure: OVERSOWE DUODENAL ULCER;  Surgeon: Violeta Gelinas, MD;  Location: [facility] OR;  Service:  General;  Laterality: N/A;  . TONSILLECTOMY  05/17/2008    OB History    No data available       Home Medications    Prior to Admission medications   Medication Sig Start Date End Date Taking? Authorizing Provider  Acetaminophen 500 MG coapsule Take by mouth every 6 (six) hours as needed for fever.    Historical Provider, MD  albuterol (PROVENTIL HFA;VENTOLIN HFA) 108 (90 Base) MCG/ACT inhaler Inhale 2 puffs into the lungs every 4 (four) hours as needed for wheezing or shortness of breath. 05/09/15   Charm Rings, MD  amitriptyline (ELAVIL) 50 MG tablet TAKE 1 TABLET BY MOUTH AT BEDTIME 04/05/16   Babs Sciara, MD  cetirizine (ZYRTEC) 10 MG tablet Take 1 tablet (10 mg total) by mouth daily. 05/09/15   Charm Rings, MD  clonazePAM (KLONOPIN) 0.5 MG tablet TAKE (1) TABLET BY MOUTH TWICE A DAY AS NEEDED. 04/06/16   Babs Sciara, MD  DULoxetine (CYMBALTA) 60 MG capsule Take 1 capsule (60 mg total) by mouth daily. 01/12/16   Babs Sciara, MD  fluconazole (DIFLUCAN) 150 MG tablet Onethree days aprt as needed 04/02/16   Merlyn Albert, MD  fluticasone [place]) 50 MCG/ACT nasal spray Place 2 sprays into both nostrils daily. Patient taking differently: Place 2 sprays into both nostrils daily as needed for allergies.  01/28/15   Charm Rings, MD  hydrochlorothiazide (HYDRODIURIL) 25 MG tablet TAKE 1/2 TO 1 TABLETS BY MOUTH EVERY MORNING AS NEEDED FOR SWELLING 06/23/15   Babs Sciara, MD  KLOR-CON 10 10 MEQ tablet TAKE 1 TABLET BY MOUTH DAILY WHEN TAKING HCTZ 11/11/14   Babs Sciara, MD  levothyroxine (SYNTHROID, LEVOTHROID) 75 MCG tablet Take 1 tablet (75 mcg total) by mouth daily. 03/19/16   Babs Sciara, MD  oxyCODONE-acetaminophen (PERCOCET) 10-325 MG tablet Take 1 tablet by mouth every 6 (six) hours as needed for pain. 06/07/16   Babs Sciara, MD  pantoprazole (PROTONIX) 40 MG tablet Take 1 tablet (40 mg total) by mouth 2 (two) times daily. 11/11/14   Babs Sciara, MD  promethazine  (PHENERGAN) 25 MG tablet TAKE 1 TABLET BY MOUTH EVERY 8 HOURS AS NEEDED 11/24/15   Babs Sciara, MD    Family History No family history on file.  Social History Social History  Substance Use Topics  . Smoking status: Current Every Day Smoker    Packs/day: 1.50    Years: 20.00  . Smokeless tobacco: Never Used  . Alcohol use No     Allergies   Gabapentin; Zofran [ondansetron hcl]; and Opana [oxymorphone hcl]   Review of Systems Review of Systems  Constitutional: Negative for chills and fever.  Gastrointestinal: Positive for nausea. Negative for vomiting.  Musculoskeletal: Positive for arthralgias (right forearm). Negative for joint swelling.  Skin: Positive for wound (right forearm).     Physical Exam Updated Vital Signs BP 128/88 (BP Location: Left Arm)   Pulse (!) 116  Resp 18   Ht  (1.6 m)   Wt 170 lb (77.1 kg)   SpO2 100%   BMI 30.11 kg/m   Physical Exam  Constitutional: She is oriented to person, place, and time. She appears well-developed and well-nourished. No distress.  HENT:  Head: Normocephalic and atraumatic.  Eyes: EOM are normal.  Neck: Normal range of motion.  Cardiovascular: Normal rate, regular rhythm and normal heart sounds.   Pulmonary/Chest: Effort normal and breath sounds normal.  Abdominal: Soft. She exhibits no distension. There is no tenderness.  Musculoskeletal: Normal range of motion.  Neurological: She is alert and oriented to person, place, and time.  Skin: Skin is warm and dry. Capillary refill takes less than 2 seconds. No rash noted. No erythema. No pallor.  Puncture wound to the medial aspect of the right forearm. Bleeding controlled. Ecchymosis and edema noted.   Psychiatric: She has a normal mood and affect. Judgment normal.  Nursing note and vitals reviewed.    ED Treatments / Results  DIAGNOSTIC STUDIES: Oxygen Saturation is 100% on RA, normal by my interpretation.    COORDINATION OF CARE: 1:12 PM Discussed  treatment plan with pt at bedside and pt agreed to plan, which includes pain medication, irrigation, tetanus shot, antibiotics, and nausea medication.   Labs (all labs ordered are listed, but only abnormal results are displayed) Labs Reviewed - No data to display  EKG  EKG Interpretation None       Radiology No results found.  Procedures Procedures (including critical care time)  Medications Ordered in ED Medications  amoxicillin-clavulanate (AUGMENTIN) 875-125 MG per tablet 1 tablet (1 tablet Oral Given 06/07/16 1320)  povidone-iodine (BETADINE) 10 % external solution (not administered)  Tdap (BOOSTRIX) injection 0.5 mL (0.5 mLs Intramuscular Given 06/07/16 1254)  oxyCODONE-acetaminophen (PERCOCET/ROXICET) 5-325 MG per tablet 2 tablet (2 tablets Oral Given 06/07/16 1320)  promethazine (PHENERGAN) tablet 25 mg (25 mg Oral Given 06/07/16 1320)    Initial Impression / Assessment and Plan / ED Course  I have reviewed the triage vital signs and the nursing notes.  Pertinent labs & imaging results that were available during my care of the patient were reviewed by me and considered in my medical decision making (see chart for details).  36 year old female presents with dog bite. She has a small puncture wound to right forearm.  Bleeding is controlled. Tdap updated. Wound was irrigated and dressed in the ED. She is evasive with questioning of the dogs whereabouts but she states they are accessible to be watched and are UTD on rabies vaccinations. I advised observation of the dogs for any abnormal behavior and will defer rabies treatment at this point. Pt given Augmentin in the ED and given home dose of pain medicine and Phenergan. Will d/c with Augmentin and advised follow up with PCP. Return precautions given.   Final Clinical Impressions(s) / ED Diagnoses   Final diagnoses:  Dog bite of right hand, initial encounter    New Prescriptions New Prescriptions   No medications on file   I  personally performed the services described in this documentation, which was scribed in my presence. The recorded information has been reviewed and is accurate.     Bethel Born, PA-C 06/07/16 1408    Blane Ohara, MD 06/08/16 [phone]

## 2016-06-07 NOTE — ED Notes (Signed)
Ask pt what is the address of where the dog is located, pt state "I don't know"  Pt states she is trying to get in touch with owner at this time to verify the dog is up to date on rabies vaccinations.  Pt made of aware of consequences if dog were to have rabies.  Pt states she understands.  EDP and charge nurse notified.

## 2016-06-08 NOTE — Telephone Encounter (Signed)
Form was signed thank you 

## 2016-06-10 ENCOUNTER — Encounter: Payer: Self-pay | Admitting: Family Medicine

## 2016-06-10 ENCOUNTER — Ambulatory Visit (INDEPENDENT_AMBULATORY_CARE_PROVIDER_SITE_OTHER): Payer: Medicaid Other | Admitting: Family Medicine

## 2016-06-10 VITALS — BP 118/74 | Temp 98.5°F | Ht 63.0 in | Wt 167.0 lb

## 2016-06-10 DIAGNOSIS — W540XXD Bitten by dog, subsequent encounter: Secondary | ICD-10-CM | POA: Diagnosis not present

## 2016-06-10 DIAGNOSIS — S41101D Unspecified open wound of right upper arm, subsequent encounter: Secondary | ICD-10-CM | POA: Diagnosis not present

## 2016-06-10 MED ORDER — MUPIROCIN 2 % EX OINT: TOPICAL_OINTMENT | CUTANEOUS | 2 refills | Status: AC

## 2016-06-10 MED ORDER — AMOXICILLIN-POT CLAVULANATE 875-125 MG PO TABS: 1.0000 | ORAL_TABLET | Freq: Two times a day (BID) | ORAL | 0 refills | Status: DC

## 2016-06-10 NOTE — Progress Notes (Signed)
   Subjective:    Patient ID: Kimberly Weiss, female    DOB: 19-Oct-1980, 36 y.o.   MRN: [id number]  HPIF[place] up dog bite to right forearm. Happened on 06/07/16. Patient was trying to break up two dogs that were fighting when she got bit.   She states she got bit on the arm cause bleeding got a tetanus shot got antibiotics at the ER told to follow-up taking pain medicine as directed relates a lot of soreness pain no numbness or tingling into the hand  Review of Systems No fever or chills see above    Objective:   Physical Exam  Elbow normal hand is normal significant 34 since by 2 inch wound on the arm consistent with dog bite  No sign a hand infection no deep infection noted    Assessment & Plan:  Continue antibiotics for 2 weeks use Bactroban ointment twice daily covered wash daily follow-up if problems recheck within 10-14 days

## 2016-06-23 ENCOUNTER — Telehealth: Payer: Self-pay | Admitting: Family Medicine

## 2016-06-23 NOTE — Telephone Encounter (Signed)
[facility] called to state that they need MRI results for patient no older than 6 months in order to get her scheduled.  Without a new MRI the only thing they can offer her is for her to see physical medicine for possible PT or injections but they don't give pain meds   Patient's last lumbar MRI was 06/2015  Please order new MRI for patient & I will send results once they're ready     [facility] Ph# [phone], Fx# [phone]

## 2016-06-23 NOTE — Telephone Encounter (Signed)
In order to get new MRI pt will need follow up OV ( NOT emergent)inform pt then schedule ov- at that ov I can schedule MRI

## 2016-06-24 ENCOUNTER — Ambulatory Visit: Payer: Medicaid Other | Admitting: Family Medicine

## 2016-06-25 NOTE — Telephone Encounter (Signed)
Called pt, explained, pt verbalized understanding, appt scheduled

## 2016-06-29 ENCOUNTER — Other Ambulatory Visit: Payer: Self-pay | Admitting: Family Medicine

## 2016-07-05 ENCOUNTER — Ambulatory Visit: Payer: Medicaid Other | Admitting: Family Medicine

## 2016-07-06 ENCOUNTER — Ambulatory Visit: Payer: Medicaid Other | Admitting: Family Medicine

## 2016-07-12 ENCOUNTER — Telehealth: Payer: Self-pay | Admitting: Family Medicine

## 2016-07-12 MED ORDER — CLONAZEPAM 0.5 MG PO TABS: ORAL_TABLET | ORAL | 3 refills | Status: DC

## 2016-07-12 NOTE — Telephone Encounter (Signed)
He would be fine for the pharmacy to use dissolvable Klonopin 0.5 mg twice a day #60 with 3 refills(not sure if this will be covered by her insurance)

## 2016-07-12 NOTE — Telephone Encounter (Signed)
Spoke with patient and informed her per Dr.Scott Luking- we are sending in Klonopin 0.5MG  twice a day the dissolvable. Patient verbalized understanding.

## 2016-07-12 NOTE — Telephone Encounter (Signed)
Patient has been trying to get her clonazePAM (KLONOPIN) 0.5 MG tablet filled for about a week now and it is still on back order.  Pharmacy said they have the dissolvable kind in stock now and patient would like the prescription changed to that so she can go ahead and get the meds. [facility] on [address] said they would fax us something but she is not sure if they did or not.  Please call patient if she can get this so she can go pick up today since she has been out for a while.

## 2016-07-13 ENCOUNTER — Encounter: Payer: Self-pay | Admitting: Family Medicine

## 2016-07-13 ENCOUNTER — Ambulatory Visit (INDEPENDENT_AMBULATORY_CARE_PROVIDER_SITE_OTHER): Payer: Medicaid Other | Admitting: Family Medicine

## 2016-07-13 VITALS — BP 114/76 | Ht 63.0 in | Wt 169.0 lb

## 2016-07-13 DIAGNOSIS — M5442 Lumbago with sciatica, left side: Secondary | ICD-10-CM | POA: Diagnosis not present

## 2016-07-13 DIAGNOSIS — M5441 Lumbago with sciatica, right side: Secondary | ICD-10-CM | POA: Diagnosis not present

## 2016-07-13 NOTE — Progress Notes (Signed)
   Subjective:    Patient ID: Kimberly Weiss, female    DOB: 12/08/1980, 36 y.o.   MRN: [id number]  Back Pain  This is a chronic problem. The current episode started more than 1 year ago. The pain is present in the lumbar spine. Pertinent negatives include no abdominal pain, chest pain or fever.  This patient is been having low back pain for years. Over the past 4 months progressive pain was seen back in February anti-inflammatory stretching exercises was recommended patient unable to do physical therapy because her insurance does not do a good job of covering it she has been on pain medicine will be seen a pain medicine specialist a neurosurgical specialist as stated they would see her if she had up-to-date MRI she relates low back pain radiates into both hips down both legs into the knee on the right side goes down into the side leg left side not as far denies any loss of bowel or bladder but does relate some weakness Patient states no other concerns this visit.   She does not have a history surgery Review of Systems  Constitutional: Negative for fatigue, fever and unexpected weight change.  HENT: Negative for congestion.   Respiratory: Negative for cough.   Cardiovascular: Negative for chest pain.  Gastrointestinal: Negative for abdominal pain.  Musculoskeletal: Positive for back pain.       Objective:   Physical Exam  Cardiovascular: Normal rate and regular rhythm.   Pulmonary/Chest: Effort normal. No respiratory distress.  Abdominal: There is no tenderness.  Musculoskeletal: She exhibits tenderness.   Her range of motion with rotational extension and flexion is diminished she has minimal ability to walk on her heels and toes she does have positive straight leg raise bilateral       Assessment & Plan:  Low back pain with bilateral sciatica-patient has been seen for the past several years earlier this year she was having serious trouble with it we recommended pain medicine  anti-inflammatory stretching exercises. Patient cannot afford to go to physical therapy her dedicated will only cover 1 visit so therefore she's been doing her exercises at home The patient has been under our care for the past 3 months doing anti-inflammatory stretching exercises and gentle range of motion without seeing any improvement see discussion above Because of the prolonged pain discomfort despite using various medications we will proceed forward with doing the MRI the lumbar spine and referral [facility]

## 2016-07-27 ENCOUNTER — Ambulatory Visit (HOSPITAL_COMMUNITY)
Admission: RE | Admit: 2016-07-27 | Discharge: 2016-07-27 | Disposition: A | Discharge status: Home / Self Care | Payer: Medicaid Other | Source: Ambulatory Visit | Attending: Family Medicine | Admitting: Family Medicine

## 2016-07-27 DIAGNOSIS — M5442 Lumbago with sciatica, left side: Secondary | ICD-10-CM | POA: Diagnosis not present

## 2016-07-27 DIAGNOSIS — M5441 Lumbago with sciatica, right side: Secondary | ICD-10-CM | POA: Diagnosis not present

## 2016-07-27 DIAGNOSIS — M4807 Spinal stenosis, lumbosacral region: Secondary | ICD-10-CM | POA: Insufficient documentation

## 2016-07-27 DIAGNOSIS — M4317 Spondylolisthesis, lumbosacral region: Secondary | ICD-10-CM | POA: Diagnosis not present

## 2016-07-27 DIAGNOSIS — M5126 Other intervertebral disc displacement, lumbar region: Secondary | ICD-10-CM | POA: Insufficient documentation

## 2016-07-29 NOTE — Addendum Note (Signed)
Addended by: Jeralene PetersREWS, Kynesha Guerin R on: 07/29/2016 08:32 AM   Modules accepted: Orders

## 2016-07-30 ENCOUNTER — Other Ambulatory Visit: Payer: Self-pay | Admitting: Family Medicine

## 2016-08-03 DIAGNOSIS — G8929 Other chronic pain: Secondary | ICD-10-CM | POA: Diagnosis not present

## 2016-08-03 DIAGNOSIS — M5136 Other intervertebral disc degeneration, lumbar region: Secondary | ICD-10-CM | POA: Diagnosis not present

## 2016-08-03 DIAGNOSIS — M4306 Spondylolysis, lumbar region: Secondary | ICD-10-CM | POA: Diagnosis not present

## 2016-08-03 DIAGNOSIS — G894 Chronic pain syndrome: Secondary | ICD-10-CM | POA: Diagnosis not present

## 2016-08-03 DIAGNOSIS — M5440 Lumbago with sciatica, unspecified side: Secondary | ICD-10-CM | POA: Diagnosis not present

## 2016-08-31 ENCOUNTER — Encounter: Payer: Self-pay | Admitting: Family Medicine

## 2016-08-31 ENCOUNTER — Ambulatory Visit (INDEPENDENT_AMBULATORY_CARE_PROVIDER_SITE_OTHER): Payer: Medicaid Other | Admitting: Family Medicine

## 2016-08-31 VITALS — BP 118/78 | Ht 63.0 in | Wt 168.0 lb

## 2016-08-31 DIAGNOSIS — Z79891 Long term (current) use of opiate analgesic: Secondary | ICD-10-CM

## 2016-08-31 MED ORDER — OXYCODONE-ACETAMINOPHEN 10-325 MG PO TABS: 1.0000 | ORAL_TABLET | Freq: Four times a day (QID) | ORAL | 0 refills | Status: DC | PRN

## 2016-08-31 NOTE — Progress Notes (Signed)
   Subjective:    Patient ID: Kimberly Weiss, female    DOB: 03/27/1980, 36 y.o.   MRN: [id number]  HPI This patient was seen today for chronic pain. Takes for low back pain, both legs and feet. Getting worse  The medication list was reviewed and updated.   -Compliance with medication: yes  - Number patient states they take daily: 4 a day  -when was the last dose patient took? today  The patient was advised the importance of maintaining medication and not using illegal substances with these.  Refills needed: yes  The patient was educated that we can provide 3 monthly scripts for their medication, it is their responsibility to follow the instructions.  Side effects or complications from medications: none  Patient is aware that pain medications are meant to minimize the severity of the pain to allow their pain levels to improve to allow for better function. They are aware of that pain medications cannot totally remove their pain.  Due for UDT ( at least once per year) : due today  Patient does have depression but is not suicidal she is seen a psychiatrist       Review of Systems  Constitutional: Negative for activity change and appetite change.  Gastrointestinal: Negative for abdominal pain and vomiting.  Neurological: Negative for weakness.  Psychiatric/Behavioral: Negative for confusion.       Objective:   Physical Exam  Constitutional: She appears well-nourished. No distress.  HENT:  Head: Normocephalic.  Cardiovascular: Normal rate, regular rhythm and normal heart sounds.   No murmur heard. Pulmonary/Chest: Effort normal and breath sounds normal.  Musculoskeletal: She exhibits no edema.  Lymphadenopathy:    She has no cervical adenopathy.  Neurological: She is alert.  Psychiatric: Her behavior is normal.  Vitals reviewed.         Assessment & Plan:  Severe chronic back pain with sciatica-I am sympathetic to the patient. She would like to see Dr. Channing Muttersoy if  we are unable to get her in with him then we will [facility]. We will call the neurosurgeon  I have advised this patient that Klonopin her psychiatrist is prescribing his putting her at risk of accidental overdose and death I've encouraged her to cut back she states she does not take it 3 times a day I told her she needs to cut back to at least 0.5 mg twice a day when necessary not to take it at the same time as a pain medicine I've also advised her not to overuse the pain medicine maximum use 4 times per day she assures me that it does not cause her drowsiness. We have tried to get her in with numerous pain management facilities who been unwilling to take on additional patient.  Urine drug screen collected today one prescription given the additional 2 prescriptions will be given once urine drug screen his back  Recheck patient in 3 months

## 2016-08-31 NOTE — Patient Instructions (Signed)
  Do NOT use more than 0.5 mg clonazepam twice a day as needed as we discussed      As part of your visit today we have covered your chronic pain. You have been given prescription(s) for pain medicines.The DEA and [facility] require that any patient on pain medications must be seen every 3 months. You are expected to come in for a office visit before further pain medications are issued.  Since we are managing your pain do not get pain scripts from other doctors. We check the prescription registry regularly. If you are receiving pain medicines from another source we will STOP prescribing pain medicines.   We will not refill medications or early nor will we give an extended month supply at the end of these prescriptions.It is your responsibility to keep up with medications. They will not be replaced.  It is your responsibility to schedule an office visit in 3 months to be seen before you are out of your medication. Do not call our office to request early refills or additional refills. Do not wait till the last moment to schedule the follow up visit. We highly recommend you schedule this now for 3 months.  We believe that most patients take their meds as prescribed but drug misuse and diversion is a serious problem in the [place]. Our office does standard measures to insure proper care to all. All patients are subject to random urine drug screens/ saliva tests and random pill counts. Also all patients drug prescription records are reviewed on a regular basis in accordance with [facility] medical board policies.  Remember, do not use alcohol or illegal drugs with your pain medications. If you are feeling drowsy or affected by your medicine you are not to operate any machinery , do any dangerous activities or drive while this is occurring.   We are required by law to adhere to strict regulations. Failure on our part to follow these regulations could jeopardize our prescription license which in turn would  cause us not to be able to care for you.Thank you for your understanding and following these policies.

## 2016-09-06 LAB — TOXASSURE SELECT 13 (MW), URINE

## 2016-09-07 MED ORDER — OXYCODONE-ACETAMINOPHEN 10-325 MG PO TABS: 1.0000 | ORAL_TABLET | Freq: Four times a day (QID) | ORAL | 0 refills | Status: DC | PRN

## 2016-09-07 NOTE — Addendum Note (Signed)
Addended by: Theodora BlowREWS, SHANNON R on: 09/07/2016 04:08 PM   Modules accepted: Orders

## 2016-09-09 ENCOUNTER — Other Ambulatory Visit: Payer: Self-pay | Admitting: *Deleted

## 2016-09-09 DIAGNOSIS — G8929 Other chronic pain: Secondary | ICD-10-CM

## 2016-09-09 DIAGNOSIS — M549 Dorsalgia, unspecified: Principal | ICD-10-CM

## 2016-09-09 NOTE — Progress Notes (Signed)
Please let the patient know that I did speak with Dr. Channing Muttersoy he agrees to see the patient. It will not be until August that he sees the patient. It is unknown at this point if Dr. Channing Muttersoy will do surgery or not it is up to his decision once he sees the patient. It is also such to where Dr. Channing Muttersoy does not necessarily guarantee that any treatment will totally remove the pain-please relate all this to the patient-this is good news that Dr. Channing Muttersoy agrees to see the patient for a opinion/consultation

## 2016-09-09 NOTE — Progress Notes (Signed)
Please go ahead with referral to Dr. Serafina Royalsoy-he states he does not need MRI sent he can see that. When referral is sent to Dr. Channing Muttersoy please write on the front of the referral that I spoke with Dr. Channing Muttersoy and Dr. Channing Muttersoy agrees to see the patient in August. Please initiate the referral and send copy of this to [facility]

## 2016-09-20 ENCOUNTER — Encounter: Payer: Self-pay | Admitting: Family Medicine

## 2016-10-02 ENCOUNTER — Other Ambulatory Visit: Payer: Self-pay | Admitting: Family Medicine

## 2016-10-05 ENCOUNTER — Telehealth: Payer: Self-pay | Admitting: Family Medicine

## 2016-10-05 ENCOUNTER — Encounter: Payer: Self-pay | Admitting: Family Medicine

## 2016-10-05 NOTE — Telephone Encounter (Signed)
Discussed with pt and [facility].

## 2016-10-05 NOTE — Telephone Encounter (Signed)
The patient may have this filled today in order so she can have her dosing for tomorrow. That is as long as [facility] allows for that.

## 2016-10-05 NOTE — Telephone Encounter (Signed)
Patient wants to know if Dr. Lorin PicketScott can call [facility] today and let them know that she can fill her Rx for pain medication today.  She said she wants to have her pill when she wakes up in the morning and it'll be easier for her to just go ahead and get this today.

## 2016-10-06 DIAGNOSIS — M4317 Spondylolisthesis, lumbosacral region: Secondary | ICD-10-CM | POA: Diagnosis not present

## 2016-10-06 DIAGNOSIS — M47816 Spondylosis without myelopathy or radiculopathy, lumbar region: Secondary | ICD-10-CM | POA: Diagnosis not present

## 2016-10-06 DIAGNOSIS — M4306 Spondylolysis, lumbar region: Secondary | ICD-10-CM | POA: Diagnosis not present

## 2016-10-08 ENCOUNTER — Other Ambulatory Visit: Payer: Self-pay | Admitting: Neurosurgery

## 2016-10-08 ENCOUNTER — Other Ambulatory Visit: Payer: Self-pay | Admitting: Family Medicine

## 2016-10-08 DIAGNOSIS — M4306 Spondylolysis, lumbar region: Secondary | ICD-10-CM

## 2016-10-08 DIAGNOSIS — M47816 Spondylosis without myelopathy or radiculopathy, lumbar region: Secondary | ICD-10-CM

## 2016-10-14 ENCOUNTER — Other Ambulatory Visit: Payer: Medicaid Other

## 2016-10-15 ENCOUNTER — Ambulatory Visit
Admission: RE | Admit: 2016-10-15 | Discharge: 2016-10-15 | Disposition: A | Discharge status: Home / Self Care | Payer: Medicaid Other | Source: Ambulatory Visit | Attending: Neurosurgery | Admitting: Neurosurgery

## 2016-10-15 ENCOUNTER — Other Ambulatory Visit: Payer: Self-pay | Admitting: Neurosurgery

## 2016-10-15 DIAGNOSIS — M47816 Spondylosis without myelopathy or radiculopathy, lumbar region: Secondary | ICD-10-CM

## 2016-10-15 DIAGNOSIS — M545 Low back pain: Secondary | ICD-10-CM

## 2016-10-18 ENCOUNTER — Encounter: Payer: Self-pay | Admitting: Family Medicine

## 2016-10-19 DIAGNOSIS — M4317 Spondylolisthesis, lumbosacral region: Secondary | ICD-10-CM | POA: Insufficient documentation

## 2016-11-07 IMAGING — MR MR LUMBAR SPINE W/O CM
4 of 5 series · 14 of 48 positions shown · non-contrast
Comparison: None.

CLINICAL DATA: Low back pain radiating to both legs since 2262.

EXAM:
MRI LUMBAR SPINE WITHOUT CONTRAST
TECHNIQUE: Multiplanar, multisequence MR imaging of the lumbar spine was
performed. No intravenous contrast was administered.

[Series 3: T2 · sagittal · 4.0mm · 0.74mm/px · 5 of 13 slices shown (1 of 2)]
[im 1/13]
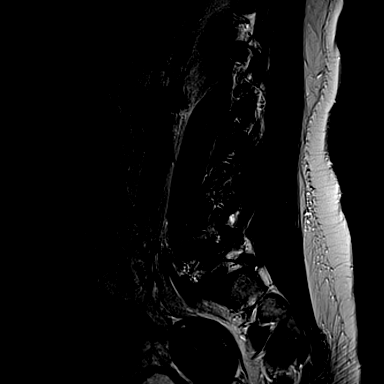
[im 4/13]
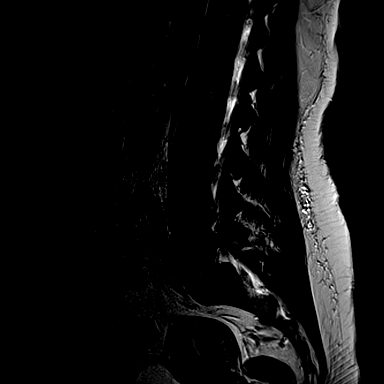
[im 7/13]
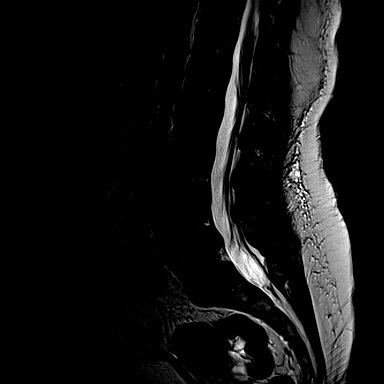
[im 10/13]
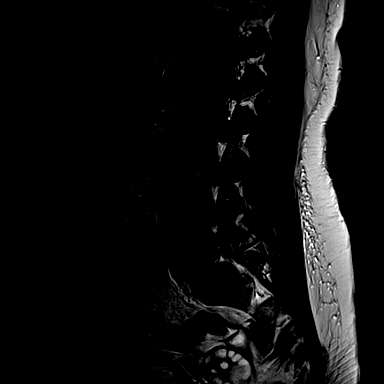
[im 13/13]
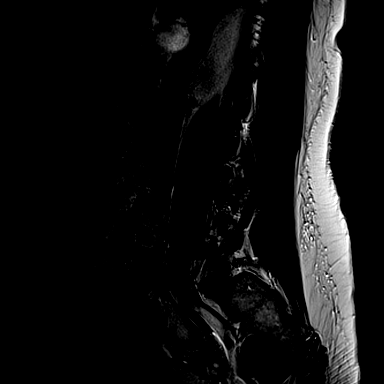

[Series 4: T1 · sagittal · 4.0mm · 0.37mm/px · 3 of 13 slices shown (1 of 2)]
[im 1/13]
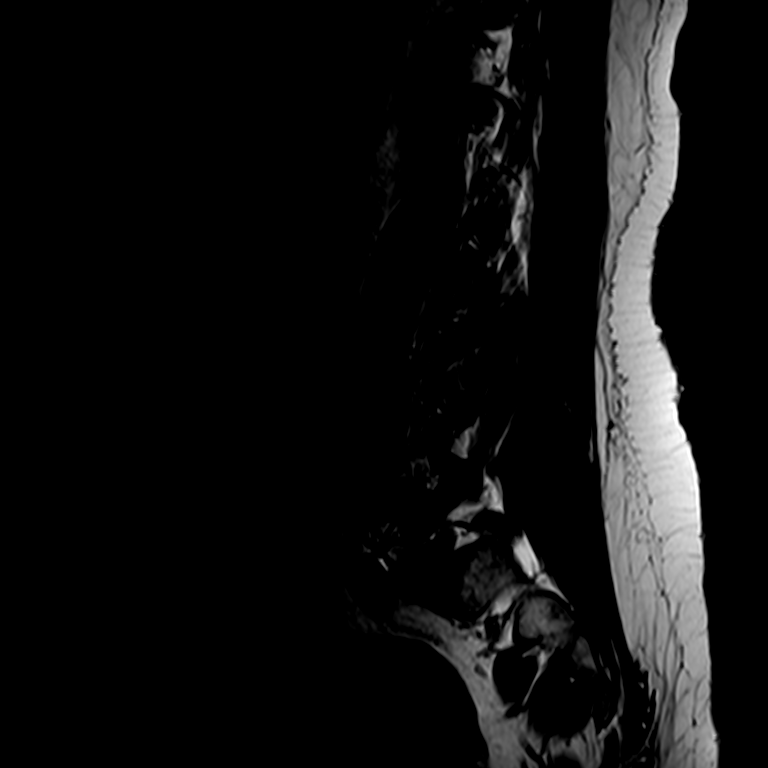
[im 7/13]
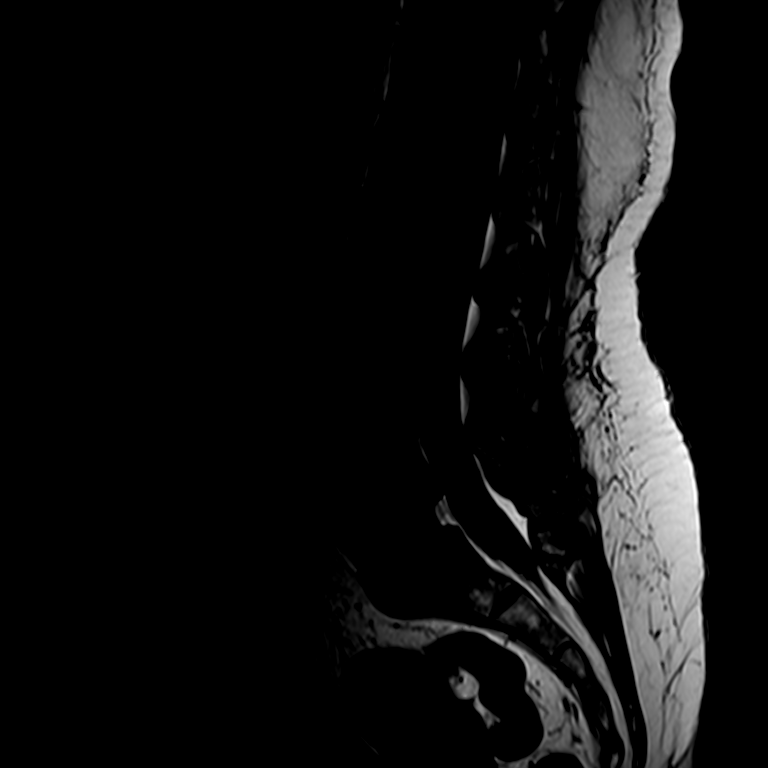
[im 13/13]
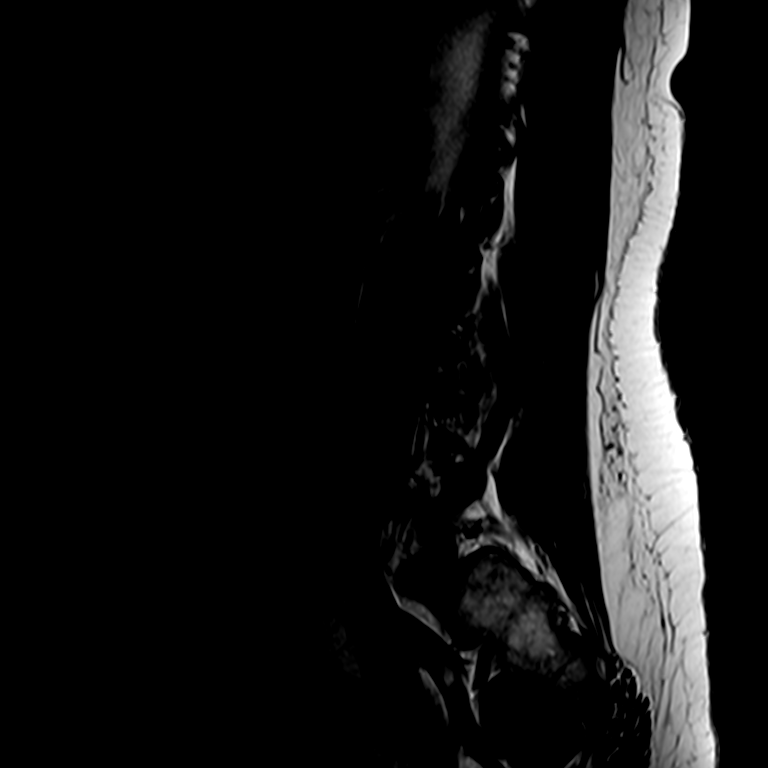

[Series 6: T2 · axial · 4.0mm · 0.26mm/px · z∈[-65,+101]mm · 3 of 42 slices shown (2 of 2)]
[im 6/42]
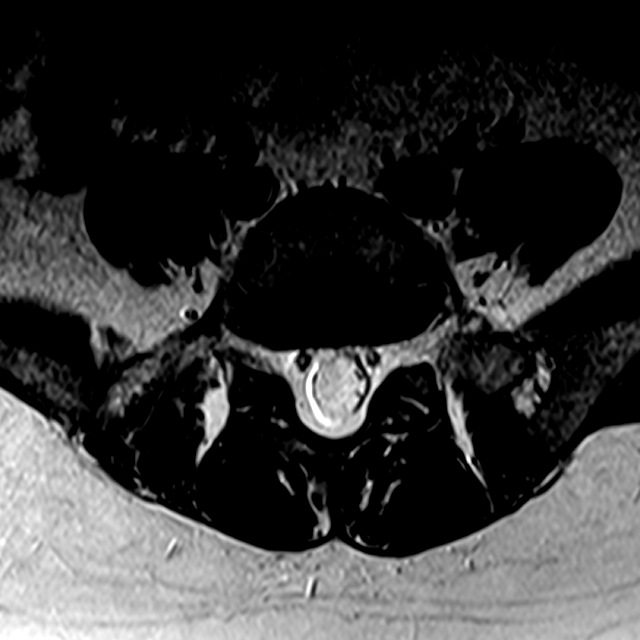
[im 21/42]
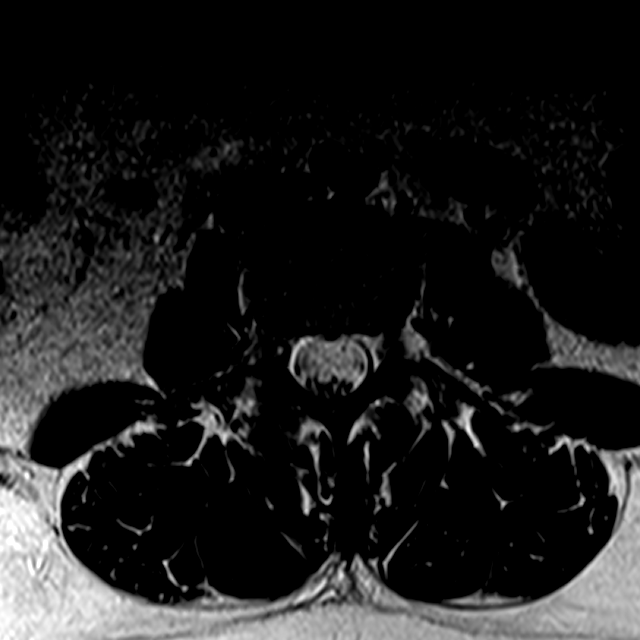
[im 36/42]
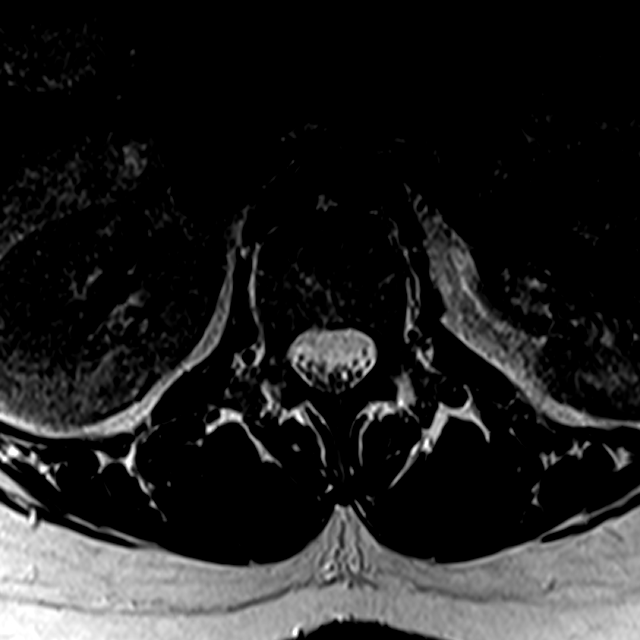

[Series 7: T1 · axial · 4.0mm · 0.26mm/px · z∈[-69,+92]mm · 3 of 40 slices shown (2 of 2)]
[im 6/40]
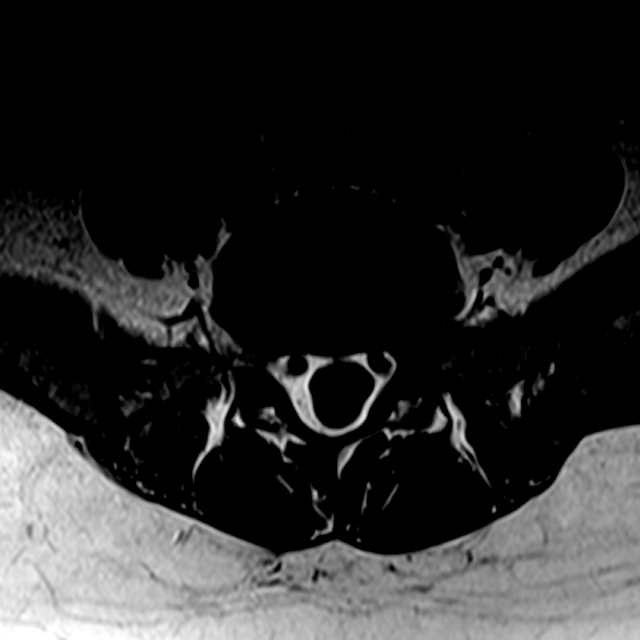
[im 21/40]
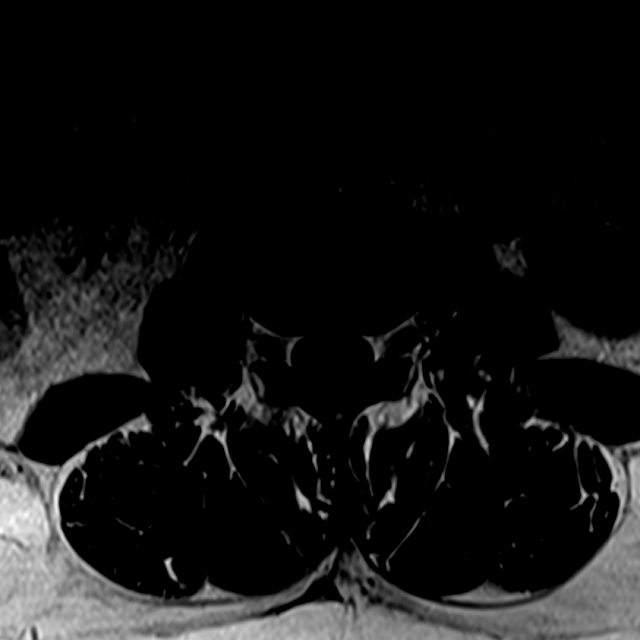
[im 34/40]
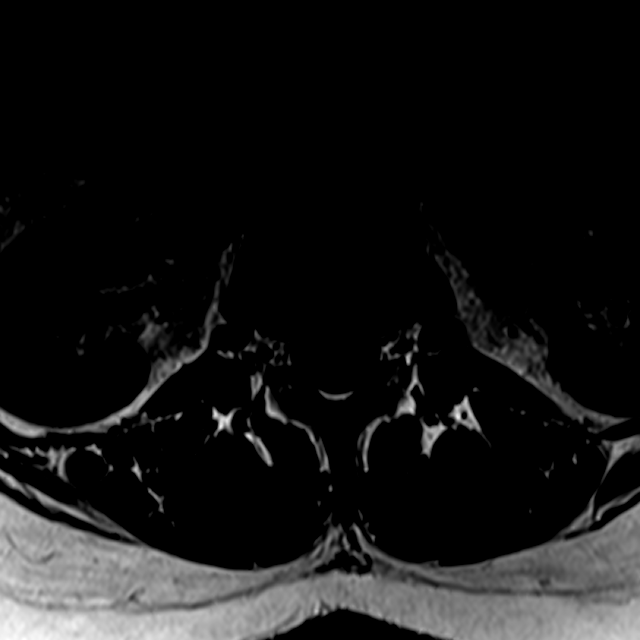

[14 of 48 positions shown; findings below may reference images not displayed]

FINDINGS: Segmentation:  Standard.

Alignment: Grade 1 anterolisthesis of L5 on S1 secondary to
bilateral L5 pars interarticularis defects. 1-2 mm of retrolisthesis
of L4 on L5.

Vertebrae:  No fracture, evidence of discitis, or bone lesion.

Conus medullaris: Extends to the T12-L1 level and appears normal.

Paraspinal and other soft tissues: Negative.

Disc levels:

Disc spaces: Degenerative disc disease with disc height loss at
L5-S1. Disc desiccation at L4-5.

T12-L1: No significant disc bulge. No evidence of neural foraminal
stenosis. No central canal stenosis.

L1-L2: No significant disc bulge. No evidence of neural foraminal
stenosis. No central canal stenosis.

L2-L3: No significant disc bulge. No evidence of neural foraminal
stenosis. No central canal stenosis.

L3-L4: No significant disc bulge. No evidence of neural foraminal
stenosis. No central canal stenosis.

L4-L5: Broad-based disc bulge with a a right paracentral/foraminal
disc protrusion with mass effect on the right intraspinal L5 nerve
root. Mild bilateral facet arthropathy. Mild left foraminal
narrowing. Moderate right foraminal narrowing. No central canal
stenosis.

L5-S1: Mild broad-based disc bulge. Moderate bilateral foraminal
narrowing. No central canal stenosis.
IMPRESSION: 1. At L4-5 there is a broad-based disc bulge with a a right
paracentral/foraminal disc protrusion with mass effect on the right
intraspinal L5 nerve root. Mild bilateral facet arthropathy. Mild
left foraminal narrowing. Moderate right foraminal narrowing.
2. At L5-S1 there is a mild broad-based disc bulge. Moderate
bilateral foraminal narrowing.

## 2016-11-10 ENCOUNTER — Other Ambulatory Visit: Payer: Self-pay | Admitting: Family Medicine

## 2016-11-29 ENCOUNTER — Encounter: Payer: Self-pay | Admitting: Family Medicine

## 2016-11-29 ENCOUNTER — Ambulatory Visit (INDEPENDENT_AMBULATORY_CARE_PROVIDER_SITE_OTHER): Payer: Medicaid Other | Admitting: Family Medicine

## 2016-11-29 VITALS — BP 116/76 | Ht 63.0 in | Wt 165.6 lb

## 2016-11-29 DIAGNOSIS — Z79891 Long term (current) use of opiate analgesic: Secondary | ICD-10-CM

## 2016-11-29 MED ORDER — OXYCODONE-ACETAMINOPHEN 10-325 MG PO TABS: 1.0000 | ORAL_TABLET | Freq: Four times a day (QID) | ORAL | 0 refills | Status: DC | PRN

## 2016-11-29 MED ORDER — LEVOTHYROXINE SODIUM 75 MCG PO TABS: 75.0000 ug | ORAL_TABLET | Freq: Every day | ORAL | 1 refills | Status: DC

## 2016-11-29 MED ORDER — AMITRIPTYLINE HCL 50 MG PO TABS: 50.0000 mg | ORAL_TABLET | Freq: Every day | ORAL | 3 refills | Status: DC

## 2016-11-29 NOTE — Progress Notes (Signed)
   Subjective:    Patient ID: Kimberly Weiss, female    DOB: 11-05-1980, 36 y.o.   MRN: [id number]  HPI  This patient was seen today for chronic pain  The medication list was reviewed and updated.   -Compliance with medication: yes oxycodone 10   - Number patient states they take daily: 4 a day  -when was the last dose patient took? This am  The patient was advised the importance of maintaining medication and not using illegal substances with these.  Refills needed: yes  The patient was educated that we can provide 3 monthly scripts for their medication, it is their responsibility to follow the instructions.  Side effects or complications from medications: none  Patient is aware that pain medications are meant to minimize the severity of the pain to allow their pain levels to improve to allow for better function. They are aware of that pain medications cannot totally remove their pain.  Due for UDT ( at least once per year) : 08/2016  Patient with chronic pain in her mid and lower back she has surgery coming up on October 22 she denies any abuse of pain medicines states it does help her function better  She also states her psychiatrist took her off of benzodiazepines she is currently on BuSpar     Review of Systems  Constitutional: Negative for activity change and appetite change.  HENT: Negative for congestion.   Respiratory: Negative for cough.   Cardiovascular: Negative for chest pain.  Gastrointestinal: Negative for abdominal pain and vomiting.  Skin: Negative for color change.  Neurological: Negative for weakness.  Psychiatric/Behavioral: Negative for confusion.       Objective:   Physical Exam  Constitutional: She appears well-nourished. No distress.  HENT:  Head: Normocephalic.  Right Ear: External ear normal.  Left Ear: External ear normal.  Eyes: Right eye exhibits no discharge. Left eye exhibits no discharge.  Neck: No tracheal deviation present.    Cardiovascular: Normal rate, regular rhythm and normal heart sounds.   No murmur heard. Pulmonary/Chest: Effort normal and breath sounds normal. No respiratory distress. She has no wheezes. She has no rales.  Musculoskeletal: She exhibits no edema.  Lymphadenopathy:    She has no cervical adenopathy.  Neurological: She is alert.  Psychiatric: Her behavior is normal.  Vitals reviewed.         Assessment & Plan:  Chronic pain-2 prescriptions written patient will have her surgery on the 22nd certainly is hopeful her pain level will be less and she can taper off of the pain medicine we discussed tapering should be at a rate of a half a tablet every 5 days as tolerated she will follow-up later in November so we can touch base on how things are going

## 2016-12-07 ENCOUNTER — Telehealth: Payer: Self-pay | Admitting: Family Medicine

## 2016-12-07 MED ORDER — AMOXICILLIN-POT CLAVULANATE 875-125 MG PO TABS: 1.0000 | ORAL_TABLET | Freq: Two times a day (BID) | ORAL | 0 refills | Status: AC

## 2016-12-07 NOTE — Telephone Encounter (Signed)
Patient seen Dr. Lorin Picket on 11/29/16.  She said she brought up her sinus issues and Dr. Lorin Picket told her to call in and we would send in something if her symptoms got worse.  She said her face and sinuses and swollen, trouble breathing, ears hurting.  She said she has about 3 days left of Amoxicillin that she was prescribed before, but is unsure if this will take care of it.  [facility] [address] [place]

## 2016-12-07 NOTE — Telephone Encounter (Signed)
We will go ahead with a stronger antibiotic Augmentin 875 mg 1 twice a day for 10 days-I her know that this is a stronger version of amoxicillin she should take it with a snack lessen the chance of upset stomach-it can cause mild diarrhea for some individuals.-It should gradually and significantly help her sinus illness-follow-up if ongoing

## 2016-12-07 NOTE — Telephone Encounter (Signed)
Prescription sent electronically to pharmacy. Patient notified. 

## 2016-12-20 ENCOUNTER — Telehealth: Payer: Self-pay | Admitting: Family Medicine

## 2016-12-20 NOTE — Telephone Encounter (Signed)
Because beach being slurred I do not recommend prescribing tramadol. Tylenol or ibuprofen for the pain-she should see the dentist tomorrow

## 2016-12-20 NOTE — Telephone Encounter (Signed)
Spoke with patient and informed her per Dr.Scott Luking- Does not recommend tramadol. Tylenol or ibuprofen for the pain. You should still see the dentist tomorrow. Patient verbalized understanding.

## 2016-12-20 NOTE — Telephone Encounter (Signed)
Certainly sorry she has a abscess tooth. Amoxicillin 500 mg 1 3 times a day for 7 days, tramadol 50 mg 1 4 times a day when necessary pain #16. (We cannot change the dosing on her chronic pain medicine-FYI )As for [facility] that accepts adult Medicaid I'm not aware of any I would recommend that she call her health department in [place] see if they have a list or call around to various practices in that region

## 2016-12-20 NOTE — Telephone Encounter (Signed)
Spoke with patient and informed her per Dr.Scott Luking- Amoxicillin 500 mg 1 3 times a day for 7 days, tramadol 50 mg 1 4 times a day when necessary pain #16. (We cannot change the dosing on her chronic pain medicine-FYI )As for [facility] that accepts adult Medicaid I'm not aware of any I would recommend that she call her health department in [place] see if they have a list or call around to various practices in that region. Patient verbalized understanding and stated that the Amoxicillin will not help. She also stated that she does want the tramadol and that she found has a dental appointment for tomorrow (FYI patient speech was slurred) Please advise?

## 2016-12-20 NOTE — Telephone Encounter (Signed)
Patient has an abscessed wisdom tooth and wants to know if Dr. Lorin PicketScott can call her something in.  She said the pain has been unbearable for the past week.  She doesn't have a dentist anymore because of her insurance.  She would also like to know if Dr. Lorin PicketScott can give her the name to a dentist that is taking Adult Medicaid.  [facility] [address] [place] [place]

## 2017-01-03 DIAGNOSIS — Z029 Encounter for administrative examinations, unspecified: Secondary | ICD-10-CM

## 2017-01-14 ENCOUNTER — Ambulatory Visit: Payer: Medicaid Other | Admitting: Family Medicine

## 2017-01-17 ENCOUNTER — Encounter: Payer: Self-pay | Admitting: Family Medicine

## 2017-01-17 ENCOUNTER — Ambulatory Visit: Payer: Medicaid Other | Admitting: Family Medicine

## 2017-01-17 VITALS — BP 124/84 | Temp 98.7°F | Ht 63.0 in | Wt 166.0 lb

## 2017-01-17 DIAGNOSIS — J0191 Acute recurrent sinusitis, unspecified: Secondary | ICD-10-CM

## 2017-01-17 MED ORDER — LEVOFLOXACIN 500 MG PO TABS: 500.0000 mg | ORAL_TABLET | Freq: Every day | ORAL | 0 refills | Status: DC

## 2017-01-17 NOTE — Patient Instructions (Signed)

## 2017-01-17 NOTE — Progress Notes (Signed)
   Subjective:    Patient ID: Kimberly Weiss, female    DOB: 07/21/1980, 36 y.o.   MRN: [id number]  Sinusitis  This is a recurrent problem. The current episode started more than 1 month ago. Associated symptoms include congestion, coughing, ear pain, headaches and sinus pressure. Pertinent negatives include no shortness of breath.  Significant head congestion sinus pressure drainage coughing denies wheezing difficulty breathing has surgery coming up in early January  Patient in today for pain management follow up from 11/29/16. Also has concerns of sinusitis.  Review of Systems  Constitutional: Negative for activity change and fever.  HENT: Positive for congestion, ear pain, rhinorrhea and sinus pressure.   Eyes: Negative for discharge.  Respiratory: Positive for cough. Negative for shortness of breath and wheezing.   Cardiovascular: Negative for chest pain.  Neurological: Positive for headaches.       Objective:   Physical Exam  Constitutional: She appears well-developed.  HENT:  Head: Normocephalic.  Right Ear: External ear normal.  Left Ear: External ear normal.  Nose: Nose normal.  Mouth/Throat: Oropharynx is clear and moist. No oropharyngeal exudate.  Eyes: Right eye exhibits no discharge. Left eye exhibits no discharge.  Neck: Neck supple. No tracheal deviation present.  Cardiovascular: Normal rate and normal heart sounds.  No murmur heard. Pulmonary/Chest: Effort normal and breath sounds normal. She has no wheezes. She has no rales.  Lymphadenopathy:    She has no cervical adenopathy.  Skin: Skin is warm and dry.  Nursing note and vitals reviewed.         Assessment & Plan:  Patient has surgery for her back in January she can continue her pain medicine as is until then once she goes through her surgery hopefully she will be able to gradually taper down on pain medicine  Sinusitis antibiotic prescribed warning signs discussed follow-up if ongoing troubles

## 2017-01-21 ENCOUNTER — Telehealth: Payer: Self-pay | Admitting: *Deleted

## 2017-01-21 NOTE — Telephone Encounter (Signed)
See fax [facility] concerning pain medication

## 2017-01-23 NOTE — Telephone Encounter (Signed)
(   Nurses see previous message, also I looked up her rx history on the drug registry. She does have a pain conract with us as well)Please let patient know, we aree aware that she received pain rx from Dr Maryanna ShapeGreg Crisp on 12/28/16, she also had a rx filled that we wrote on 12/05/2016. She then tried to fill a rx we wrote for November. This is getting pain Rx from more than 1 provider at a time and therefore is a violation of our pain contract. We will no longer be filling Pain rx for her. Hetr future pain rx need to be through the pain management docotor.She will now be considered under the care of Dr Metta Clinesrisp pain management. We will no longerr write these pain scripts. She should NOT try to fill the rx we wrote for November-she needs to get her rx from Dr Metta Clinesrisp and we consider her under their care for pain management. She can continue here for her other routine health needs.

## 2017-01-24 NOTE — Telephone Encounter (Signed)
Discussed with pt. Pt verbalized understanding.  °

## 2017-04-19 ENCOUNTER — Other Ambulatory Visit: Payer: Self-pay | Admitting: *Deleted

## 2017-04-19 ENCOUNTER — Telehealth: Payer: Self-pay | Admitting: Family Medicine

## 2017-04-19 MED ORDER — AMITRIPTYLINE HCL 50 MG PO TABS: 50.0000 mg | ORAL_TABLET | Freq: Every day | ORAL | 0 refills | Status: DC

## 2017-04-19 NOTE — Telephone Encounter (Signed)
Requesting refill on amitriptyline 50 mg tab.  She said she is having a bad flare up of interstitial cystitis.  She has an appointment next week 04/27/17 to see Dr. Lorin PicketScott for a follow up.  [facility] [phone] (p)

## 2017-04-19 NOTE — Telephone Encounter (Signed)
Refill sent to pharm. Pt notified  

## 2017-04-19 NOTE — Telephone Encounter (Signed)
She may have 1 refill of the amitriptyline as requested #90

## 2017-04-27 ENCOUNTER — Ambulatory Visit: Payer: Medicaid Other | Admitting: Family Medicine

## 2017-07-29 ENCOUNTER — Telehealth: Payer: Self-pay | Admitting: *Deleted

## 2017-07-29 MED ORDER — AMITRIPTYLINE HCL 50 MG PO TABS: 50.0000 mg | ORAL_TABLET | Freq: Every day | ORAL | 0 refills | Status: DC

## 2017-07-29 MED ORDER — LEVOTHYROXINE SODIUM 75 MCG PO TABS: 75.0000 ug | ORAL_TABLET | Freq: Every day | ORAL | 0 refills | Status: DC

## 2017-07-29 MED ORDER — HYDROCHLOROTHIAZIDE 25 MG PO TABS: ORAL_TABLET | ORAL | 0 refills | Status: DC

## 2017-07-29 NOTE — Telephone Encounter (Signed)
The patient may have 3 months of refills on these medicine but it is necessary for this patient to keep all regular follow-up visits

## 2017-07-29 NOTE — Telephone Encounter (Signed)
Patient called stating she changed pharmacy  [facility] [place], per patient her other pharmacy moved.   Patient asked for her thyroid medicine, bladder medicine, and hydrodiuril to be refilled at [facility] [place].  [phone]

## 2017-07-29 NOTE — Telephone Encounter (Addendum)
Bladder medication is amitriptyline. Prescriptions sent electronically to pharmacy. Patient notified.

## 2017-07-29 NOTE — Telephone Encounter (Signed)
Left message to return call (need to know name of bladder medication)

## 2017-08-18 ENCOUNTER — Encounter: Payer: Self-pay | Admitting: Family Medicine

## 2017-08-18 ENCOUNTER — Ambulatory Visit: Payer: Self-pay | Admitting: Family Medicine

## 2017-08-18 VITALS — BP 118/76 | Temp 99.1°F | Ht 63.0 in | Wt 151.0 lb

## 2017-08-18 DIAGNOSIS — M79601 Pain in right arm: Secondary | ICD-10-CM

## 2017-08-18 DIAGNOSIS — W57XXXA Bitten or stung by nonvenomous insect and other nonvenomous arthropods, initial encounter: Secondary | ICD-10-CM

## 2017-08-18 DIAGNOSIS — S50861A Insect bite (nonvenomous) of right forearm, initial encounter: Secondary | ICD-10-CM

## 2017-08-18 MED ORDER — PROMETHAZINE HCL 25 MG PO TABS: 25.0000 mg | ORAL_TABLET | Freq: Three times a day (TID) | ORAL | 0 refills | Status: DC | PRN

## 2017-08-18 MED ORDER — DOXYCYCLINE HYCLATE 100 MG PO TABS: 100.0000 mg | ORAL_TABLET | Freq: Two times a day (BID) | ORAL | 0 refills | Status: DC

## 2017-08-18 NOTE — Progress Notes (Signed)
   Subjective:    Patient ID: Kimberly Weiss, female    DOB: 08/29/1980, 37 y.o.   MRN: [id number]  HPIpulled tick off of right lower arm 4 days ago. Yesterday started having headache, fever, vomiting, joint pain, nausea.   Reg sized tick not small  Likely the bite was over night    yest developed headaceh and nausea, feverish late last night, with nausea  Joints aching bad ,,    Localized rash with the ite, rash on arm from the tick sensitive but a little itchy     Review of Systems No headache, no major weight loss or weight gain, no chest pain no back pain abdominal pain no change in bowel habits complete ROS otherwise negative     Objective:   Physical Exam Alert hydration good.  Moderate malaise.  Neck supple.  Lungs clear.  Heart rate and rhythm.  Small erythematous swelling at site of tick bite no rash specifically       Assessment & Plan:  1 impression probable viral syndrome.  Though with recent tick bite.  Need to cover with antibiotic just in case rationale discussed doxycycline medications are discussed

## 2017-09-21 DIAGNOSIS — Z79891 Long term (current) use of opiate analgesic: Secondary | ICD-10-CM | POA: Diagnosis not present

## 2017-09-21 DIAGNOSIS — G894 Chronic pain syndrome: Secondary | ICD-10-CM | POA: Diagnosis not present

## 2017-09-21 DIAGNOSIS — M5136 Other intervertebral disc degeneration, lumbar region: Secondary | ICD-10-CM | POA: Diagnosis not present

## 2017-09-21 DIAGNOSIS — Z5181 Encounter for therapeutic drug level monitoring: Secondary | ICD-10-CM | POA: Diagnosis not present

## 2017-10-18 DIAGNOSIS — Z79891 Long term (current) use of opiate analgesic: Secondary | ICD-10-CM | POA: Diagnosis not present

## 2017-10-18 DIAGNOSIS — M5136 Other intervertebral disc degeneration, lumbar region: Secondary | ICD-10-CM | POA: Diagnosis not present

## 2017-10-18 DIAGNOSIS — G894 Chronic pain syndrome: Secondary | ICD-10-CM | POA: Diagnosis not present

## 2017-10-18 DIAGNOSIS — Z5181 Encounter for therapeutic drug level monitoring: Secondary | ICD-10-CM | POA: Diagnosis not present

## 2017-11-16 DIAGNOSIS — M5136 Other intervertebral disc degeneration, lumbar region: Secondary | ICD-10-CM | POA: Diagnosis not present

## 2017-11-16 DIAGNOSIS — Z79891 Long term (current) use of opiate analgesic: Secondary | ICD-10-CM | POA: Diagnosis not present

## 2017-11-16 DIAGNOSIS — G894 Chronic pain syndrome: Secondary | ICD-10-CM | POA: Diagnosis not present

## 2017-11-16 DIAGNOSIS — Z5181 Encounter for therapeutic drug level monitoring: Secondary | ICD-10-CM | POA: Diagnosis not present

## 2017-11-21 ENCOUNTER — Telehealth: Payer: Self-pay | Admitting: Family Medicine

## 2017-11-21 NOTE — Telephone Encounter (Signed)
Patient has been sick for 2-3 weeks with sinuses and bronchitis, pt would like to set an appt up this week with Dr.Scott preferred if possible only SAME DAYS available.

## 2017-11-21 NOTE — Telephone Encounter (Signed)
Patient state she is not running a fever,but she feels hot to touch. I advised that we do not have any available appt for today if she thought she needed to be seen today she would need to go to an urgent care or ed to be evaluated. She states understanding.

## 2017-11-22 ENCOUNTER — Ambulatory Visit: Payer: Medicaid Other | Admitting: Family Medicine

## 2017-11-22 ENCOUNTER — Encounter: Payer: Self-pay | Admitting: Family Medicine

## 2017-11-22 VITALS — Temp 98.5°F | Ht 63.0 in | Wt 155.6 lb

## 2017-11-22 DIAGNOSIS — J019 Acute sinusitis, unspecified: Secondary | ICD-10-CM

## 2017-11-22 DIAGNOSIS — E876 Hypokalemia: Secondary | ICD-10-CM | POA: Diagnosis not present

## 2017-11-22 DIAGNOSIS — E038 Other specified hypothyroidism: Secondary | ICD-10-CM | POA: Diagnosis not present

## 2017-11-22 DIAGNOSIS — B9689 Other specified bacterial agents as the cause of diseases classified elsewhere: Secondary | ICD-10-CM | POA: Diagnosis not present

## 2017-11-22 MED ORDER — LEVOTHYROXINE SODIUM 75 MCG PO TABS: 75.0000 ug | ORAL_TABLET | Freq: Every day | ORAL | 1 refills | Status: DC

## 2017-11-22 MED ORDER — AMITRIPTYLINE HCL 50 MG PO TABS: 50.0000 mg | ORAL_TABLET | Freq: Every day | ORAL | 1 refills | Status: DC

## 2017-11-22 MED ORDER — HYDROCHLOROTHIAZIDE 25 MG PO TABS: ORAL_TABLET | ORAL | 1 refills | Status: DC

## 2017-11-22 MED ORDER — DOXYCYCLINE HYCLATE 100 MG PO TABS: 100.0000 mg | ORAL_TABLET | Freq: Two times a day (BID) | ORAL | 0 refills | Status: DC

## 2017-11-22 MED ORDER — ALBUTEROL SULFATE HFA 108 (90 BASE) MCG/ACT IN AERS: 2.0000 | INHALATION_SPRAY | RESPIRATORY_TRACT | 2 refills | Status: DC | PRN

## 2017-11-22 MED ORDER — DULOXETINE HCL 60 MG PO CPEP: 60.0000 mg | ORAL_CAPSULE | Freq: Every day | ORAL | 5 refills | Status: DC

## 2017-11-22 MED ORDER — POTASSIUM CHLORIDE ER 10 MEQ PO TBCR: EXTENDED_RELEASE_TABLET | ORAL | 4 refills | Status: DC

## 2017-11-22 NOTE — Progress Notes (Signed)
   Subjective:    Patient ID: Kimberly Weiss, female    DOB: 04/04/1980, 37 y.o.   MRN: [id number]  Cough  This is a new problem. The current episode started 1 to 4 weeks ago. Associated symptoms include ear pain, a fever, headaches, nasal congestion, rhinorrhea, a sore throat and wheezing. Pertinent negatives include no chest pain or shortness of breath. Treatments tried: excedrin and penicillian.   Patient with significant head congestion drainage cough some no fevers no vomiting or diarrhea Patient has thyroid condition.  Takes thyroid medication on a regular basis.  States that the proper way.  Relates compliance.  States no negative side effects.  States condition seems to be under good control.    Review of Systems  Constitutional: Positive for fever. Negative for activity change.  HENT: Positive for congestion, ear pain, rhinorrhea and sore throat.   Eyes: Negative for discharge.  Respiratory: Positive for cough and wheezing. Negative for shortness of breath.   Cardiovascular: Negative for chest pain.  Neurological: Positive for headaches.       Objective:   Physical Exam  Constitutional: She appears well-developed.  HENT:  Head: Normocephalic.  Nose: Nose normal.  Mouth/Throat: Oropharynx is clear and moist. No oropharyngeal exudate.  Neck: Neck supple.  Cardiovascular: Normal rate and normal heart sounds.  No murmur heard. Pulmonary/Chest: Effort normal and breath sounds normal. She has no wheezes.  Lymphadenopathy:    She has no cervical adenopathy.  Skin: Skin is warm and dry.  Nursing note and vitals reviewed.         Assessment & Plan:  Hypothyroidism continue medication check lab work Intermittent diuretic use with potassium check lab work Sinusitis antibiotic prescribed warning signs discussed follow-up if problems Follow-up every 6 months

## 2017-11-23 LAB — BASIC METABOLIC PANEL
BUN/Creatinine Ratio: 16 (ref 9–23)
BUN: 9 mg/dL (ref 6–20)
CO2: 22 mmol/L (ref 20–29)
Calcium: 9.1 mg/dL (ref 8.7–10.2)
Chloride: 98 mmol/L (ref 96–106)
Creatinine, Ser: 0.55 mg/dL — ABNORMAL LOW (ref 0.57–1.00)
GFR calc Af Amer: 138 mL/min/{1.73_m2} (ref >59)
GFR calc non Af Amer: 119 mL/min/{1.73_m2} (ref >59)
Glucose: 86 mg/dL (ref 65–99)
Potassium: 4.3 mmol/L (ref 3.5–5.2)
Sodium: 138 mmol/L (ref 134–144)

## 2017-11-23 LAB — TSH: TSH: 5.03 u[IU]/mL — ABNORMAL HIGH (ref 0.450–4.500)

## 2017-11-24 MED ORDER — LEVOTHYROXINE SODIUM 88 MCG PO TABS: 75.0000 ug | ORAL_TABLET | Freq: Every day | ORAL | 6 refills | Status: DC

## 2017-11-24 NOTE — Addendum Note (Signed)
Addended by: Meredith Leeds on: 11/24/2017 01:59 PM   Modules accepted: Orders

## 2017-11-27 ENCOUNTER — Emergency Department (HOSPITAL_COMMUNITY): Payer: Medicaid Other

## 2017-11-27 ENCOUNTER — Other Ambulatory Visit: Payer: Self-pay

## 2017-11-27 ENCOUNTER — Emergency Department (HOSPITAL_COMMUNITY)
Admission: EM | Admit: 2017-11-27 | Discharge: 2017-11-27 | Disposition: A | Discharge status: Home / Self Care | Payer: Medicaid Other | Attending: Emergency Medicine | Admitting: Emergency Medicine

## 2017-11-27 ENCOUNTER — Encounter (HOSPITAL_COMMUNITY): Payer: Self-pay

## 2017-11-27 ENCOUNTER — Encounter: Payer: Self-pay | Admitting: Family Medicine

## 2017-11-27 DIAGNOSIS — E039 Hypothyroidism, unspecified: Secondary | ICD-10-CM | POA: Diagnosis not present

## 2017-11-27 DIAGNOSIS — R6884 Jaw pain: Secondary | ICD-10-CM | POA: Diagnosis not present

## 2017-11-27 DIAGNOSIS — F17210 Nicotine dependence, cigarettes, uncomplicated: Secondary | ICD-10-CM | POA: Insufficient documentation

## 2017-11-27 DIAGNOSIS — K0889 Other specified disorders of teeth and supporting structures: Secondary | ICD-10-CM | POA: Diagnosis present

## 2017-11-27 DIAGNOSIS — Z79899 Other long term (current) drug therapy: Secondary | ICD-10-CM | POA: Diagnosis not present

## 2017-11-27 DIAGNOSIS — K047 Periapical abscess without sinus: Secondary | ICD-10-CM | POA: Diagnosis not present

## 2017-11-27 DIAGNOSIS — I10 Essential (primary) hypertension: Secondary | ICD-10-CM | POA: Diagnosis not present

## 2017-11-27 DIAGNOSIS — R220 Localized swelling, mass and lump, head: Secondary | ICD-10-CM | POA: Diagnosis not present

## 2017-11-27 LAB — CBC WITH DIFFERENTIAL/PLATELET
Abs Immature Granulocytes: 0 10*3/uL (ref 0.0–0.1)
Basophils Absolute: 0.1 10*3/uL (ref 0.0–0.1)
Basophils Relative: 1 %
Eosinophils Absolute: 0.8 10*3/uL — ABNORMAL HIGH (ref 0.0–0.7)
Eosinophils Relative: 6 %
HCT: 38.2 % (ref 36.0–46.0)
Hemoglobin: 12.4 g/dL (ref 12.0–15.0)
Immature Granulocytes: 0 %
Lymphocytes Relative: 17 %
Lymphs Abs: 2.4 10*3/uL (ref 0.7–4.0)
MCH: 29.8 pg (ref 26.0–34.0)
MCHC: 32.5 g/dL (ref 30.0–36.0)
MCV: 91.8 fL (ref 78.0–100.0)
Monocytes Absolute: 0.9 10*3/uL (ref 0.1–1.0)
Monocytes Relative: 6 %
Neutro Abs: 9.4 10*3/uL — ABNORMAL HIGH (ref 1.7–7.7)
Neutrophils Relative %: 70 %
Platelets: 399 10*3/uL (ref 150–400)
RBC: 4.16 MIL/uL (ref 3.87–5.11)
RDW: 13.7 % (ref 11.5–15.5)
WBC: 13.6 10*3/uL — ABNORMAL HIGH (ref 4.0–10.5)

## 2017-11-27 LAB — BASIC METABOLIC PANEL
Anion gap: 8 (ref 5–15)
BUN: 8 mg/dL (ref 6–20)
CO2: 23 mmol/L (ref 22–32)
Calcium: 8.8 mg/dL — ABNORMAL LOW (ref 8.9–10.3)
Chloride: 105 mmol/L (ref 98–111)
Creatinine, Ser: 0.72 mg/dL (ref 0.44–1.00)
GFR calc Af Amer: >60 mL/min (ref >60)
GFR calc non Af Amer: >60 mL/min (ref >60)
Glucose, Bld: 92 mg/dL (ref 70–99)
Potassium: 3.2 mmol/L — ABNORMAL LOW (ref 3.5–5.1)
Sodium: 136 mmol/L (ref 135–145)

## 2017-11-27 MED ORDER — FENTANYL CITRATE (PF) 100 MCG/2ML IJ SOLN
100.0000 ug | Freq: Once | INTRAMUSCULAR | Status: AC
  Administered 2017-11-27: 100 ug via INTRAVENOUS
  Filled 2017-11-27: qty 2

## 2017-11-27 MED ORDER — SODIUM CHLORIDE 0.9 % IV BOLUS
500.0000 mL | Freq: Once | INTRAVENOUS | Status: AC
  Administered 2017-11-27: 500 mL via INTRAVENOUS

## 2017-11-27 MED ORDER — IOHEXOL 300 MG/ML  SOLN: 100.0000 mL | Freq: Once | INTRAMUSCULAR | Status: DC | PRN

## 2017-11-27 MED ORDER — OXYCODONE-ACETAMINOPHEN 5-325 MG PO TABS
1.0000 | ORAL_TABLET | ORAL | Status: DC | PRN
  Administered 2017-11-27: 1 via ORAL
  Filled 2017-11-27: qty 1

## 2017-11-27 MED ORDER — CLINDAMYCIN HCL 150 MG PO CAPS: 450.0000 mg | ORAL_CAPSULE | Freq: Three times a day (TID) | ORAL | 0 refills | Status: DC

## 2017-11-27 MED ORDER — KETOROLAC TROMETHAMINE 15 MG/ML IJ SOLN
15.0000 mg | Freq: Once | INTRAMUSCULAR | Status: AC
  Administered 2017-11-27: 15 mg via INTRAVENOUS
  Filled 2017-11-27: qty 1

## 2017-11-27 MED ORDER — NICOTINE 21 MG/24HR TD PT24
21.0000 mg | MEDICATED_PATCH | Freq: Once | TRANSDERMAL | Status: DC
  Administered 2017-11-27: 21 mg via TRANSDERMAL
  Filled 2017-11-27: qty 1

## 2017-11-27 MED ORDER — BUPIVACAINE HCL 0.25 % IJ SOLN
5.0000 mL | Freq: Once | INTRAMUSCULAR | Status: AC
  Administered 2017-11-27: 5 mL
  Filled 2017-11-27 (×2): qty 5

## 2017-11-27 MED ORDER — CLINDAMYCIN PHOSPHATE 600 MG/50ML IV SOLN
600.0000 mg | Freq: Once | INTRAVENOUS | Status: AC
  Administered 2017-11-27: 600 mg via INTRAVENOUS
  Filled 2017-11-27: qty 50

## 2017-11-27 MED ORDER — FENTANYL CITRATE (PF) 100 MCG/2ML IJ SOLN: 100.0000 ug | Freq: Once | INTRAMUSCULAR | Status: DC

## 2017-11-27 MED ORDER — IOHEXOL 300 MG/ML  SOLN
75.0000 mL | Freq: Once | INTRAMUSCULAR | Status: AC | PRN
  Administered 2017-11-27: 75 mL via INTRAVENOUS

## 2017-11-27 NOTE — ED Provider Notes (Signed)
[facility] EMERGENCY DEPARTMENT Provider Note   CSN: [id number] Arrival date & time: 11/27/17  1502     History   Chief Complaint Chief Complaint  Patient presents with  . Dental Pain  . Facial Swelling    HPI Kimberly Weiss is a 37 y.o. female.  The history is provided by the patient. No language interpreter was used.  Dental Pain     Kimberly Weiss is a 37 y.o. female who presents to the Emergency Department complaining of dental pain. She reports three days of dental pain in the right lower molar. She has had problems with this tooth in the past. This morning when she awoke she developed swelling to her jaw with worsening pain. She does endorse subjective fevers over the last several days but is also currently suffering from a sinus infection with nasal congestion. She has been on doxycycline since September 25. She denies any difficulty breathing or difficulty swallowing. No prior similar symptoms. She does not have a current dentist. Past Medical History:  Diagnosis Date  . Anemia   . Anxiety   . Chronic lower back pain   . Daily headache    "related to stress and sinus" (02/20/2015)  . Depression   . GERD (gastroesophageal reflux disease)   . GI bleed   . History of blood transfusion 04/2014   "related to duodenal ulcer"  . History of duodenal ulcer 04/2014   "perforated"  . History of kidney stones   . Hypertension   . Hypothyroidism   . Interstitial cystitis   . Pneumonia ~ 2008  . Renal stones   . Thyroid disease     Patient Active Problem List   Diagnosis Date Noted  . Bilateral sciatica 06/16/2015  . S/P laparoscopic hernia repair 02/20/2015  . Hypocalcemia 06/03/2014  . Diarrhea 05/16/2014  . Chronic narcotic use 05/16/2014  . Duodenal ulcer with hemorrhage   . Hemorrhagic shock (HCC)   . Gastrointestinal hemorrhage associated with duodenal ulcer   . GI bleed   . Other specified hypotension   . Duodenal ulcer   .  Hypovolemic shock (HCC)   . Tobacco abuse   . Other specified hypothyroidism   . Anxiety state   . Head trauma   . Tachycardia   . UGIB (upper gastrointestinal bleed) 04/27/2014  . Acute blood loss anemia   . Anxiety and depression 11/03/2012  . Dysfunctional uterine bleeding 06/10/2012  . Interstitial cystitis 05/22/2012  . Hypothyroidism 05/22/2012    Past Surgical History:  Procedure Laterality Date  . BLADDER INSTILLATION  2004  . CESAREAN SECTION  05/13/2004  . ESOPHAGOGASTRODUODENOSCOPY N/A 04/27/2014   Procedure: ESOPHAGOGASTRODUODENOSCOPY (EGD);  Surgeon: Shirley Friar, MD;  Location[facility] END[facility];  Service: Endoscopy;  Laterality: N/A;  . ESOPHAGOGASTRODUODENOSCOPY (EGD) WITH PROPOFOL N/A 05/03/2014   Procedure: ESOPHAGOGASTRODUODENOSCOPY (EGD) WITH PROPOFOL;  Surgeon: Shirley Friar, MD;  Location: [facility] ENDOSCOPY;  Service: Endoscopy;  Laterality: N/A;  . HERNIA REPAIR    . INCISIONAL HERNIA REPAIR N/A 02/20/2015   Procedure: LAPAROSCOPIC INCISIONAL HERNIA;  Surgeon: Violeta Gelinas, MD;  Location: [facility] OR;  Service: General;  Laterality: N/A;  . INSERTION OF MESH N/A 02/20/2015   Procedure: INSERTION OF MESH;  Surgeon: Violeta Gelinas, MD;  Location: [facility] OR;  Service: General;  Laterality: N/A;  . LAPAROSCOPIC INCISIONAL / UMBILICAL / VENTRAL HERNIA REPAIR  02/20/2015   IHR w/mesh & LOA  . LAPAROTOMY N/A 05/07/2014   Procedure: EXPLORATORY LAPAROTOMY;  Surgeon: Laurell Josephs  Janee Morn, MD;  Location: [facility] OR;  Service: General;  Laterality: N/A;  . LITHOTRIPSY  2004  . NASAL SINUS SURGERY  05/17/2008   "removed bone spurs in my sinus cavity"  . REPAIR OF PERFORATED ULCER N/A 05/07/2014   Procedure: OVERSOWE DUODENAL ULCER;  Surgeon: Violeta Gelinas, MD;  Location: [facility] OR;  Service: General;  Laterality: N/A;  . TONSILLECTOMY  05/17/2008     OB History   None      Home Medications    Prior to Admission medications   Medication Sig Start Date End Date Taking? Authorizing  Provider  Acetaminophen 500 MG coapsule Take by mouth every 6 (six) hours as needed for fever.    [provider]  albuterol (PROVENTIL HFA;VENTOLIN HFA) 108 (90 Base) MCG/ACT inhaler Inhale 2 puffs into the lungs every 4 (four) hours as needed for wheezing or shortness of breath. 11/22/17   Babs Sciara, MD  amitriptyline (ELAVIL) 50 MG tablet Take 1 tablet (50 mg total) by mouth at bedtime. 11/22/17   Babs Sciara, MD  cetirizine (ZYRTEC) 10 MG tablet Take 1 tablet (10 mg total) by mouth daily. Patient not taking: Reported on 11/22/2017 05/09/15   Charm Rings, MD  doxycycline (VIBRA-TABS) 100 MG tablet Take 1 tablet (100 mg total) by mouth 2 (two) times daily. 11/22/17   Babs Sciara, MD  DULoxetine (CYMBALTA) 60 MG capsule Take 1 capsule (60 mg total) by mouth daily. 11/22/17   Babs Sciara, MD  hydrochlorothiazide (HYDRODIURIL) 25 MG tablet TAKE 1/2 TO 1 TABLETS BY MOUTH EVERY MORNING AS NEEDED FOR SWELLING 11/22/17   Luking, Jonna Coup, MD  levothyroxine (SYNTHROID, LEVOTHROID) 88 MCG tablet Take 1 tablet (88 mcg total) by mouth daily. 11/24/17   Babs Sciara, MD  oxyCODONE-acetaminophen (PERCOCET) 10-325 MG tablet Take 1 tablet by mouth every 6 (six) hours as needed for pain. Patient not taking: Reported on 08/18/2017 11/29/16   Babs Sciara, MD  pantoprazole (PROTONIX) 40 MG tablet Take 1 tablet (40 mg total) by mouth 2 (two) times daily. Patient not taking: Reported on 08/18/2017 11/11/14   Babs Sciara, MD  potassium chloride (KLOR-CON 10) 10 MEQ tablet TAKE 1 TABLET BY MOUTH DAILY WHEN TAKING HCTZ 11/22/17   Babs Sciara, MD  promethazine (PHENERGAN) 25 MG tablet TAKE 1 TABLET BY MOUTH EVERY 8 HOURS AS NEEDED Patient not taking: Reported on 08/18/2017 06/29/16   Babs Sciara, MD  promethazine (PHENERGAN) 25 MG tablet Take 1 tablet (25 mg total) by mouth every 8 (eight) hours as needed for nausea or vomiting. Patient not taking: Reported on 11/22/2017 08/18/17   Merlyn Albert, MD  tiaGABine (GABITRIL) 2 MG tablet Take 2 mg at bedtime by mouth.    [provider]    Family History History reviewed. No pertinent family history.  Social History Social History   Tobacco Use  . Smoking status: Current Every Day Smoker    Packs/day: 1.50    Years: 20.00    Pack years: 30.00  . Smokeless tobacco: Never Used  Substance Use Topics  . Alcohol use: No  . Drug use: No     Allergies   Gabapentin; Zofran [ondansetron hcl]; and Opana [oxymorphone hcl]   Review of Systems Review of Systems  All other systems reviewed and are negative.    Physical Exam Updated Vital Signs BP (!) 127/103   Pulse (!) 108   Temp 99.1 F (37.3 C) (Oral)  Resp 20   SpO2 97%   Physical Exam  Constitutional: She is oriented to person, place, and time. She appears well-developed and well-nourished.  HENT:  Head: Normocephalic and atraumatic.  Mouth/Throat:    Moderate edema and erythema to the right lower mandible. Moderate tenderness to palpation. There is no edema in the posterior oropharynx. Soft floor of mouth. There is significant edema and tenderness to the buccal mucosa on the right.  Cardiovascular: Regular rhythm.  No murmur heard. tachycardic  Pulmonary/Chest: Effort normal and breath sounds normal. No respiratory distress.  Abdominal: Soft. There is no tenderness. There is no rebound and no guarding.  Musculoskeletal: She exhibits no edema or tenderness.  Neurological: She is alert and oriented to person, place, and time.  Skin: Skin is warm and dry.  Psychiatric: She has a normal mood and affect. Her behavior is normal.  Nursing note and vitals reviewed.    ED Treatments / Results  Labs (all labs ordered are listed, but only abnormal results are displayed) Labs Reviewed  BASIC METABOLIC PANEL - Abnormal; Notable for the following components:      Result Value   Potassium 3.2 (*)    Calcium 8.8 (*)    All other components within  normal limits  CBC WITH DIFFERENTIAL/PLATELET - Abnormal; Notable for the following components:   WBC 13.6 (*)    Neutro Abs 9.4 (*)    Eosinophils Absolute 0.8 (*)    All other components within normal limits    EKG None  Radiology No results found.  Procedures Dental Block Date/Time: 11/27/2017 10:15 PM Performed by: Tilden Fossa, MD Authorized by: Tilden Fossa, MD   Consent:    Consent obtained:  Verbal   Consent given by:  Patient Indications:    Indications: dental abscess   Location:    Block type:  Inferior alveolar   Laterality:  Right Procedure details (see MAR for exact dosages):    Syringe type:  Luer lock syringe   Needle gauge:  27 G   Anesthetic injected:  Bupivacaine 0.5% w/o epi   Injection procedure:  Anatomic landmarks identified, introduced needle and incremental injection Post-procedure details:    Outcome:  Pain improved   Patient tolerance of procedure:  Tolerated well, no immediate complications .Marland KitchenIncision and Drainage Date/Time: 11/27/2017 10:16 PM Performed by: Tilden Fossa, MD Authorized by: Tilden Fossa, MD   Consent:    Consent obtained:  Verbal   Consent given by:  Patient Location:    Type:  Abscess   Location:  Mouth   Mouth location:  Alveolar process Procedure type:    Complexity:  Simple Procedure details:    Needle aspiration: no     Incision types:  Stab incision   Scalpel blade:  11   Drainage:  Bloody   Drainage amount:  Scant   Wound treatment:  Wound left open Post-procedure details:    Patient tolerance of procedure:  Tolerated well, no immediate complications   (including critical care time)  Medications Ordered in ED Medications  oxyCODONE-acetaminophen (PERCOCET/ROXICET) 5-325 MG per tablet 1 tablet (1 tablet Oral Given 11/27/17 1521)  clindamycin (CLEOCIN) IVPB 600 mg (has no administration in time range)  fentaNYL (SUBLIMAZE) injection 100 mcg (has no administration in time range)  bupivacaine  (MARCAINE) 0.25 % (with pres) injection 5 mL (has no administration in time range)  sodium chloride 0.9 % bolus 500 mL (has no administration in time range)     Initial Impression / Assessment and Plan /  ED Course  I have reviewed the triage vital signs and the nursing notes.  Pertinent labs & imaging results that were available during my care of the patient were reviewed by me and considered in my medical decision making (see chart for details).     Patient here for evaluation of facial pain and swelling. She has a dental abscess on examination. A dental block was performed with attempting to IND. On I&D there was no significant drainage return. CT face was obtained. CT with very small Perry apical abscess, does not appear to be large enough to drain. There is no evidence of airway involvement on examination. She is non-toxic appearing. No evidence of blood was angina. Will continue treatment with antibiotics with close outpatient follow-up as well as return precautions.  Final Clinical Impressions(s) / ED Diagnoses   Final diagnoses:  None    ED Discharge Orders    None       Tilden Fossa, MD 11/27/17 2219

## 2017-11-27 NOTE — ED Notes (Signed)
ED Provider at bedside. 

## 2017-11-27 NOTE — ED Triage Notes (Signed)
Pt here from home with new onset right jaw pain and facial swelling.  Pt started doxycycline on 11/23/17. No difficulty breathing but states it hurts to swallow.  A&Ox4 and can talk in complete sentences.

## 2017-11-27 NOTE — ED Notes (Signed)
Patient verbalizes understanding of discharge instructions. Opportunity for questioning and answers were provided. Armband removed by staff, pt discharged from ED ambulatory with ice pack.

## 2017-12-14 DIAGNOSIS — G894 Chronic pain syndrome: Secondary | ICD-10-CM | POA: Diagnosis not present

## 2017-12-14 DIAGNOSIS — M5136 Other intervertebral disc degeneration, lumbar region: Secondary | ICD-10-CM | POA: Diagnosis not present

## 2017-12-14 DIAGNOSIS — Z79891 Long term (current) use of opiate analgesic: Secondary | ICD-10-CM | POA: Diagnosis not present

## 2017-12-14 DIAGNOSIS — Z5181 Encounter for therapeutic drug level monitoring: Secondary | ICD-10-CM | POA: Diagnosis not present

## 2017-12-27 ENCOUNTER — Encounter: Payer: Self-pay | Admitting: Family Medicine

## 2017-12-28 ENCOUNTER — Other Ambulatory Visit: Payer: Self-pay | Admitting: *Deleted

## 2017-12-28 MED ORDER — DOXYCYCLINE HYCLATE 100 MG PO TABS: 100.0000 mg | ORAL_TABLET | Freq: Two times a day (BID) | ORAL | 0 refills | Status: DC

## 2017-12-28 NOTE — Telephone Encounter (Signed)
Called pt and discussed with her to try a round of doxy 100 mg one bid for 10 days. Take with a snack and a glass of water. If ongoing trouble follow up with office visit per dr Lorin Picket. Pt verbalized understanding and med sent to pharm.

## 2017-12-28 NOTE — Telephone Encounter (Signed)
Nurses Try a round of doxycycline 100 mg twice daily for 10 days Patient should take this with a snack in a glass water If ongoing troubles patient should follow-up office visit

## 2017-12-29 DIAGNOSIS — Z5181 Encounter for therapeutic drug level monitoring: Secondary | ICD-10-CM | POA: Diagnosis not present

## 2017-12-29 DIAGNOSIS — M5136 Other intervertebral disc degeneration, lumbar region: Secondary | ICD-10-CM | POA: Diagnosis not present

## 2017-12-29 DIAGNOSIS — Z79891 Long term (current) use of opiate analgesic: Secondary | ICD-10-CM | POA: Diagnosis not present

## 2017-12-29 DIAGNOSIS — G894 Chronic pain syndrome: Secondary | ICD-10-CM | POA: Diagnosis not present

## 2018-01-18 ENCOUNTER — Encounter: Payer: Self-pay | Admitting: Family Medicine

## 2018-01-18 ENCOUNTER — Ambulatory Visit: Payer: Medicaid Other | Admitting: Family Medicine

## 2018-01-18 VITALS — BP 108/70 | Temp 98.8°F | Ht 63.0 in | Wt 153.0 lb

## 2018-01-18 DIAGNOSIS — K047 Periapical abscess without sinus: Secondary | ICD-10-CM | POA: Diagnosis not present

## 2018-01-18 DIAGNOSIS — J0191 Acute recurrent sinusitis, unspecified: Secondary | ICD-10-CM

## 2018-01-18 MED ORDER — CLINDAMYCIN HCL 150 MG PO CAPS: ORAL_CAPSULE | ORAL | 0 refills | Status: DC

## 2018-01-18 NOTE — Progress Notes (Signed)
   Subjective:    Patient ID: Kimberly Weiss, female    DOB: 02/19/1981, 37 y.o.   MRN: [id number]  Dental Pain   This is a new problem. The current episode started yesterday.  abscess tooth. Had the same thing back in September and went to [place]. Tried excedrin, ice pack, oragel with no relief.   Has had sig ough and ocng and some frontal headache    Patient had similar bout over a month ago.  In the emergency room.  The only antibiotic which made a significant difference was high dose clindamycin.  Requests this again.    Review of Systems No vomiting no diarrhea no high fevers    Objective:   Physical Exam Alert mild malaise right jaw swollen bad tooth inflamed gums.  Normal neck supple mild nasal congestion intermittent bronchial cough lungs clear heart regular rate and rhythm.       Assessment & Plan:  Impression abscessed tooth with element of rhinosinusitis and high-dose clindamycin per family request.  Add activity or other probiotics.  Warning signs discussed.  See a dentist!

## 2018-02-07 DIAGNOSIS — Z5181 Encounter for therapeutic drug level monitoring: Secondary | ICD-10-CM | POA: Diagnosis not present

## 2018-02-07 DIAGNOSIS — Z79891 Long term (current) use of opiate analgesic: Secondary | ICD-10-CM | POA: Diagnosis not present

## 2018-02-07 DIAGNOSIS — G894 Chronic pain syndrome: Secondary | ICD-10-CM | POA: Diagnosis not present

## 2018-02-07 DIAGNOSIS — M5136 Other intervertebral disc degeneration, lumbar region: Secondary | ICD-10-CM | POA: Diagnosis not present

## 2018-03-08 DIAGNOSIS — G894 Chronic pain syndrome: Secondary | ICD-10-CM | POA: Diagnosis not present

## 2018-03-08 DIAGNOSIS — M5136 Other intervertebral disc degeneration, lumbar region: Secondary | ICD-10-CM | POA: Diagnosis not present

## 2018-03-08 DIAGNOSIS — Z79891 Long term (current) use of opiate analgesic: Secondary | ICD-10-CM | POA: Diagnosis not present

## 2018-03-08 DIAGNOSIS — Z5181 Encounter for therapeutic drug level monitoring: Secondary | ICD-10-CM | POA: Diagnosis not present

## 2018-04-05 DIAGNOSIS — Z5181 Encounter for therapeutic drug level monitoring: Secondary | ICD-10-CM | POA: Diagnosis not present

## 2018-04-05 DIAGNOSIS — M5136 Other intervertebral disc degeneration, lumbar region: Secondary | ICD-10-CM | POA: Diagnosis not present

## 2018-04-05 DIAGNOSIS — G894 Chronic pain syndrome: Secondary | ICD-10-CM | POA: Diagnosis not present

## 2018-04-05 DIAGNOSIS — Z79891 Long term (current) use of opiate analgesic: Secondary | ICD-10-CM | POA: Diagnosis not present

## 2018-04-24 ENCOUNTER — Ambulatory Visit (INDEPENDENT_AMBULATORY_CARE_PROVIDER_SITE_OTHER): Payer: Medicaid Other

## 2018-04-24 ENCOUNTER — Encounter (HOSPITAL_COMMUNITY): Payer: Self-pay

## 2018-04-24 ENCOUNTER — Ambulatory Visit (HOSPITAL_COMMUNITY)
Admission: EM | Admit: 2018-04-24 | Discharge: 2018-04-24 | Disposition: A | Discharge status: Home / Self Care | Payer: Medicaid Other | Attending: Family Medicine | Admitting: Family Medicine

## 2018-04-24 DIAGNOSIS — R05 Cough: Secondary | ICD-10-CM

## 2018-04-24 DIAGNOSIS — J22 Unspecified acute lower respiratory infection: Secondary | ICD-10-CM

## 2018-04-24 MED ORDER — BENZONATATE 100 MG PO CAPS: 100.0000 mg | ORAL_CAPSULE | Freq: Three times a day (TID) | ORAL | 0 refills | Status: DC

## 2018-04-24 MED ORDER — FLUTICASONE PROPIONATE 50 MCG/ACT NA SUSP: 1.0000 | Freq: Every day | NASAL | 2 refills | Status: DC

## 2018-04-24 MED ORDER — AMOXICILLIN 500 MG PO CAPS: 1000.0000 mg | ORAL_CAPSULE | Freq: Three times a day (TID) | ORAL | 0 refills | Status: AC

## 2018-04-24 NOTE — ED Provider Notes (Signed)
[facility]    CSN: [id number] Arrival date & time: 04/24/18  6213     History   Chief Complaint Chief Complaint  Patient presents with  . Headache  . Cough  . Congestion  . Generalized Body Aches    HPI Kimberly Weiss is a 38 y.o. female.   Patient is a 38 year old female with past medical history of anemia, anxiety, chronic back pain, depression, GERD, ulcer, hypertension, hypothyroidism, interstitial cystitis, pneumonia.  She presents with approximately 5 to 6 days of cough, congestion, body aches, fever, night sweats.  Symptoms have been constant and worsened .  She said a lot of sinus pressure and productive cough with mucus. She has had sinus tenderness.   She has been using over-the-counter medications without much relief of symptoms.  No recent sick contacts.  No recent traveling.  She is a current everyday smoker.  She has been using the albuterol inhaler as needed with some relief of her symptoms.  ROS per HPI      Past Medical History:  Diagnosis Date  . Anemia   . Anxiety   . Chronic lower back pain   . Daily headache    "related to stress and sinus" (02/20/2015)  . Depression   . GERD (gastroesophageal reflux disease)   . GI bleed   . History of blood transfusion 04/2014   "related to duodenal ulcer"  . History of duodenal ulcer 04/2014   "perforated"  . History of kidney stones   . Hypertension   . Hypothyroidism   . Interstitial cystitis   . Pneumonia ~ 2008  . Renal stones   . Thyroid disease     Patient Active Problem List   Diagnosis Date Noted  . Bilateral sciatica 06/16/2015  . S/P laparoscopic hernia repair 02/20/2015  . Hypocalcemia 06/03/2014  . Diarrhea 05/16/2014  . Chronic narcotic use 05/16/2014  . Duodenal ulcer with hemorrhage   . Hemorrhagic shock (HCC)   . Gastrointestinal hemorrhage associated with duodenal ulcer   . GI bleed   . Other specified hypotension   . Duodenal ulcer   . Hypovolemic shock (HCC)   .  Tobacco abuse   . Other specified hypothyroidism   . Anxiety state   . Head trauma   . Tachycardia   . UGIB (upper gastrointestinal bleed) 04/27/2014  . Acute blood loss anemia   . Anxiety and depression 11/03/2012  . Dysfunctional uterine bleeding 06/10/2012  . Interstitial cystitis 05/22/2012  . Hypothyroidism 05/22/2012    Past Surgical History:  Procedure Laterality Date  . BLADDER INSTILLATION  2004  . CESAREAN SECTION  05/13/2004  . ESOPHAGOGASTRODUODENOSCOPY N/A 04/27/2014   Procedure: ESOPHAGOGASTRODUODENOSCOPY (EGD);  Surgeon: Shirley Friar, MD;  Location: [facility] ENDOSCOPY;  Service: Endoscopy;  Laterality: N/A;  . ESOPHAGOGASTRODUODENOSCOPY (EGD) WITH PROPOFOL N/A 05/03/2014   Procedure: ESOPHAGOGASTRODUODENOSCOPY (EGD) WITH PROPOFOL;  Surgeon: Shirley Friar, MD;  Location: [facility] ENDOSCOPY;  Service: Endoscopy;  Laterality: N/A;  . HERNIA REPAIR    . INCISIONAL HERNIA REPAIR N/A 02/20/2015   Procedure: LAPAROSCOPIC INCISIONAL HERNIA;  Surgeon: Violeta Gelinas, MD;  Location: [facility] OR;  Service: General;  Laterality: N/A;  . INSERTION OF MESH N/A 02/20/2015   Procedure: INSERTION OF MESH;  Surgeon: Violeta Gelinas, MD;  Location: [facility] OR;  Service: General;  Laterality: N/A;  . LAPAROSCOPIC INCISIONAL / UMBILICAL / VENTRAL HERNIA REPAIR  02/20/2015   IHR w/mesh & LOA  . LAPAROTOMY N/A 05/07/2014   Procedure: EXPLORATORY LAPAROTOMY;  Surgeon:  Violeta Gelinas, MD;  Location: [facility] OR;  Service: General;  Laterality: N/A;  . LITHOTRIPSY  2004  . NASAL SINUS SURGERY  05/17/2008   "removed bone spurs in my sinus cavity"  . REPAIR OF PERFORATED ULCER N/A 05/07/2014   Procedure: OVERSOWE DUODENAL ULCER;  Surgeon: Violeta Gelinas, MD;  Location: [facility] OR;  Service: General;  Laterality: N/A;  . TONSILLECTOMY  05/17/2008    OB History   No obstetric history on file.      Home Medications    Prior to Admission medications   Medication Sig Start Date End Date Taking? Authorizing Provider    albuterol (PROVENTIL HFA;VENTOLIN HFA) 108 (90 Base) MCG/ACT inhaler Inhale 2 puffs into the lungs every 4 (four) hours as needed for wheezing or shortness of breath. 11/22/17   Babs Sciara, MD  amitriptyline (ELAVIL) 50 MG tablet Take 1 tablet (50 mg total) by mouth at bedtime. 11/22/17   Babs Sciara, MD  amoxicillin (AMOXIL) 500 MG capsule Take 2 capsules (1,000 mg total) by mouth 3 (three) times daily for 5 days. 04/24/18 04/29/18  Janace Aris, NP  aspirin-acetaminophen-caffeine (EXCEDRIN MIGRAINE) [phone] MG tablet Take 2 tablets by mouth every 6 (six) hours as needed for headache or migraine.    [provider]  benzonatate (TESSALON) 100 MG capsule Take 1 capsule (100 mg total) by mouth every 8 (eight) hours. 04/24/18   Dahlia Byes A, NP  cetirizine (ZYRTEC) 10 MG tablet Take 1 tablet (10 mg total) by mouth daily. Patient not taking: Reported on 01/18/2018 05/09/15   Charm Rings, MD  DULoxetine (CYMBALTA) 60 MG capsule Take 1 capsule (60 mg total) by mouth daily. Patient taking differently: Take 60 mg by mouth every other day.  11/22/17   Babs Sciara, MD  fluticasone (FLONASE) 50 MCG/ACT nasal spray Place 1 spray into both nostrils daily. 04/24/18   Kalis Friese, Gloris Manchester A, NP  hydrochlorothiazide (HYDRODIURIL) 25 MG tablet TAKE 1/2 TO 1 TABLETS BY MOUTH EVERY MORNING AS NEEDED FOR SWELLING Patient taking differently: Take 12.5-25 mg by mouth daily as needed (swelling).  11/22/17   Babs Sciara, MD  levothyroxine (SYNTHROID, LEVOTHROID) 88 MCG tablet Take 1 tablet (88 mcg total) by mouth daily. 11/24/17   Babs Sciara, MD  potassium chloride (KLOR-CON 10) 10 MEQ tablet TAKE 1 TABLET BY MOUTH DAILY WHEN TAKING HCTZ Patient taking differently: Take 10 mEq by mouth daily as needed. Only take when taking HCTZ 11/22/17   Luking, Jonna Coup, MD  promethazine (PHENERGAN) 25 MG tablet TAKE 1 TABLET BY MOUTH EVERY 8 HOURS AS NEEDED Patient taking differently: Take 25 mg by mouth every 8  (eight) hours as needed for nausea or vomiting.  06/29/16   Babs Sciara, MD  promethazine (PHENERGAN) 25 MG tablet Take 1 tablet (25 mg total) by mouth every 8 (eight) hours as needed for nausea or vomiting. 08/18/17   Merlyn Albert, MD    Family History History reviewed. No pertinent family history.  Social History Social History   Tobacco Use  . Smoking status: Current Every Day Smoker    Packs/day: 1.50    Years: 20.00    Pack years: 30.00  . Smokeless tobacco: Never Used  Substance Use Topics  . Alcohol use: No  . Drug use: No     Allergies   Gabapentin; Zofran [ondansetron hcl]; and Opana [oxymorphone hcl]   Review of Systems Review of Systems   Physical Exam Triage Vital Signs ED  Triage Vitals  Enc Vitals Group     BP 04/24/18 0920 117/65     Pulse Rate 04/24/18 0920 (!) 111     Resp 04/24/18 0920 16     Temp 04/24/18 0920 98.3 F (36.8 C)     Temp Source 04/24/18 0920 Oral     SpO2 04/24/18 0920 100 %     Weight --      Height --      Head Circumference --      Peak Flow --      Pain Score 04/24/18 0929 8     Pain Loc --      Pain Edu? --      Excl. in GC? --    No data found.  Updated Vital Signs BP 117/65 (BP Location: Right Arm)   Pulse (!) 111 Comment: Reported HR to Nurse Kim Lapan-Hutchens  Temp 98.3 F (36.8 C) (Oral)   Resp 16   LMP 04/07/2018 (Exact Date)   SpO2 100%   Visual Acuity Right Eye Distance:   Left Eye Distance:   Bilateral Distance:    Right Eye Near:   Left Eye Near:    Bilateral Near:     Physical Exam Vitals signs and nursing note reviewed.  Constitutional:      Appearance: She is well-developed. She is ill-appearing.  HENT:     Head: Normocephalic and atraumatic.     Nose:     Right Turbinates: Swollen.     Left Turbinates: Swollen.     Right Sinus: Maxillary sinus tenderness present.     Left Sinus: Maxillary sinus tenderness present.     Mouth/Throat:     Mouth: Mucous membranes are moist.      Pharynx: Oropharynx is clear.     Tonsils: Swelling: 0 on the right. 0 on the left.  Neck:     Musculoskeletal: Normal range of motion.  Cardiovascular:     Rate and Rhythm: Regular rhythm. Tachycardia present.     Heart sounds: Normal heart sounds.  Pulmonary:     Effort: Pulmonary effort is normal.     Breath sounds: Wheezing and rales present.     Comments: Harsh cough  Musculoskeletal: Normal range of motion.  Lymphadenopathy:     Cervical: No cervical adenopathy.  Skin:    General: Skin is warm and dry.  Neurological:     Mental Status: She is alert.  Psychiatric:        Mood and Affect: Mood normal.        Behavior: Behavior normal.      UC Treatments / Results  Labs (all labs ordered are listed, but only abnormal results are displayed) Labs Reviewed - No data to display  EKG None  Radiology Dg Chest 2 View  Result Date: 04/24/2018 CLINICAL DATA:  Cough. EXAM: CHEST - 2 VIEW COMPARISON:  Radiographs of January 09/2017. FINDINGS: The heart size and mediastinal contours are within normal limits. Both lungs are clear. The visualized skeletal structures are unremarkable. IMPRESSION: No active cardiopulmonary disease. Electronically Signed   By: Lupita Raider, M.D.   On: 04/24/2018 09:54    Procedures Procedures (including critical care time)  Medications Ordered in UC Medications - No data to display  Initial Impression / Assessment and Plan / UC Course  I have reviewed the triage vital signs and the nursing notes.  Pertinent labs & imaging results that were available during my care of the patient were reviewed by me and  considered in my medical decision making (see chart for details).     Treating for sinus infection and lower respiratory tract infection. Amoxicillin 3 times a day for 5 days Flonase nasal spray for nasal inflammation and congestion Tessalon Perles for cough Albuterol inhaler as needed for cough, wheezing, shortness of  breath Tylenol/ibuprofen for fever, body aches Follow up as needed for continued or worsening symptoms  Final Clinical Impressions(s) / UC Diagnoses   Final diagnoses:  Lower respiratory tract infection     Discharge Instructions     We will go ahead and treat you for lower respiratory tract infection and sinus infection.  Amoxicillin 3 x day for 5 days. Flonase nasal spray for inflammation and congestion.  Tessalon pearls for cough Albuterol inhaler for cough, wheezing, SOB every 6 hours as needed.  Tylenol/ibuprofen for fever, body aches.    ED Prescriptions    Medication Sig Dispense Auth. Provider   amoxicillin (AMOXIL) 500 MG capsule Take 2 capsules (1,000 mg total) by mouth 3 (three) times daily for 5 days. 30 capsule Aldric Wenzler A, NP   benzonatate (TESSALON) 100 MG capsule Take 1 capsule (100 mg total) by mouth every 8 (eight) hours. 21 capsule Elishah Ashmore A, NP   fluticasone (FLONASE) 50 MCG/ACT nasal spray Place 1 spray into both nostrils daily. 16 g Dahlia Byes A, NP     Controlled Substance Prescriptions  Controlled Substance Registry consulted? Not Applicable   Janace Aris, NP 04/24/18 1102

## 2018-04-24 NOTE — Discharge Instructions (Addendum)
We will go ahead and treat you for lower respiratory tract infection and sinus infection.  Amoxicillin 3 x day for 5 days. Flonase nasal spray for inflammation and congestion.  Tessalon pearls for cough Albuterol inhaler for cough, wheezing, SOB every 6 hours as needed.  Tylenol/ibuprofen for fever, body aches.

## 2018-04-24 NOTE — ED Triage Notes (Signed)
Pt presents with non productive cough, chest congestion, headache, and generalized body aches X 5 days.

## 2018-04-27 ENCOUNTER — Encounter: Payer: Self-pay | Admitting: Family Medicine

## 2018-05-02 DIAGNOSIS — M25559 Pain in unspecified hip: Secondary | ICD-10-CM | POA: Diagnosis not present

## 2018-05-02 DIAGNOSIS — M48062 Spinal stenosis, lumbar region with neurogenic claudication: Secondary | ICD-10-CM | POA: Diagnosis not present

## 2018-05-02 DIAGNOSIS — M47897 Other spondylosis, lumbosacral region: Secondary | ICD-10-CM | POA: Diagnosis not present

## 2018-05-02 DIAGNOSIS — G43001 Migraine without aura, not intractable, with status migrainosus: Secondary | ICD-10-CM | POA: Diagnosis not present

## 2018-05-02 DIAGNOSIS — M4807 Spinal stenosis, lumbosacral region: Secondary | ICD-10-CM | POA: Diagnosis not present

## 2018-05-02 DIAGNOSIS — G894 Chronic pain syndrome: Secondary | ICD-10-CM | POA: Diagnosis not present

## 2018-05-02 DIAGNOSIS — G8929 Other chronic pain: Secondary | ICD-10-CM | POA: Diagnosis not present

## 2018-05-02 DIAGNOSIS — M792 Neuralgia and neuritis, unspecified: Secondary | ICD-10-CM | POA: Diagnosis not present

## 2018-05-02 DIAGNOSIS — M4726 Other spondylosis with radiculopathy, lumbar region: Secondary | ICD-10-CM | POA: Diagnosis not present

## 2018-05-30 ENCOUNTER — Encounter: Payer: Self-pay | Admitting: Family Medicine

## 2018-05-30 DIAGNOSIS — Z79891 Long term (current) use of opiate analgesic: Secondary | ICD-10-CM | POA: Diagnosis not present

## 2018-05-30 DIAGNOSIS — Z5181 Encounter for therapeutic drug level monitoring: Secondary | ICD-10-CM | POA: Diagnosis not present

## 2018-05-30 DIAGNOSIS — G894 Chronic pain syndrome: Secondary | ICD-10-CM | POA: Diagnosis not present

## 2018-05-30 DIAGNOSIS — M5136 Other intervertebral disc degeneration, lumbar region: Secondary | ICD-10-CM | POA: Diagnosis not present

## 2018-05-31 ENCOUNTER — Ambulatory Visit (INDEPENDENT_AMBULATORY_CARE_PROVIDER_SITE_OTHER): Payer: Medicaid Other | Admitting: Family Medicine

## 2018-05-31 ENCOUNTER — Other Ambulatory Visit: Payer: Self-pay

## 2018-05-31 DIAGNOSIS — E038 Other specified hypothyroidism: Secondary | ICD-10-CM

## 2018-05-31 DIAGNOSIS — S40862A Insect bite (nonvenomous) of left upper arm, initial encounter: Secondary | ICD-10-CM | POA: Diagnosis not present

## 2018-05-31 DIAGNOSIS — W57XXXA Bitten or stung by nonvenomous insect and other nonvenomous arthropods, initial encounter: Secondary | ICD-10-CM

## 2018-05-31 MED ORDER — LEVOTHYROXINE SODIUM 88 MCG PO TABS: 88.0000 ug | ORAL_TABLET | Freq: Every day | ORAL | 6 refills | Status: DC

## 2018-05-31 MED ORDER — DOXYCYCLINE HYCLATE 100 MG PO CAPS: 100.0000 mg | ORAL_CAPSULE | Freq: Two times a day (BID) | ORAL | 0 refills | Status: DC

## 2018-05-31 MED ORDER — ALBUTEROL SULFATE HFA 108 (90 BASE) MCG/ACT IN AERS: 2.0000 | INHALATION_SPRAY | RESPIRATORY_TRACT | 2 refills | Status: DC | PRN

## 2018-05-31 MED ORDER — AMITRIPTYLINE HCL 50 MG PO TABS: 50.0000 mg | ORAL_TABLET | Freq: Every day | ORAL | 1 refills | Status: DC

## 2018-05-31 NOTE — Telephone Encounter (Signed)
Left message to return call- Patient needs telephone visit 

## 2018-05-31 NOTE — Patient Instructions (Signed)

## 2018-05-31 NOTE — Telephone Encounter (Signed)
Patient scheduled telephone visit with Dr Lorin Picket

## 2018-05-31 NOTE — Telephone Encounter (Signed)
Recommend telephone/video visit this could be done today

## 2018-05-31 NOTE — Progress Notes (Addendum)
   Subjective:    Patient ID: Kimberly Weiss, female    DOB: 01/14/1981, 38 y.o.   MRN: [id number]  HPI Today's visit was done via video with the patient Patient calls with a tick bite under left arm pit. Patient noticed the tick yesterday and removed the tick and noticed it had a white spot on its back Virtual Visit via Video Note The patient was having trouble with a tick bite that was embedded because a red area tender and painful she states that it is approximately a centimeter in diameter is not draining any type of pus in addition to this she also needs refills on her albuterol amitriptyline and her thyroid medicine she does states she takes her thyroid medicine on a regular basis I looked over her lab work I would not recommend additional lab work currently we will target that in the summertime Certainly if progressive troubles or worse she will notify us Follow-up somewhere in 4 to 6 months I connected with Kimberly Weiss on 05/31/18 at  4:00 PM EDT by a video enabled telemedicine application and verified that I am speaking with the correct person using two identifiers.   I discussed the limitations of evaluation and management by telemedicine and the availability of in person appointments. The patient expressed understanding and agreed to proceed.  History of Present Illness:    Observations/Objective:   Assessment and Plan:   Follow Up Instructions:    I discussed the assessment and treatment plan with the patient. The patient was provided an opportunity to ask questions and all were answered. The patient agreed with the plan and demonstrated an understanding of the instructions.   The patient was advised to call back or seek an in-person evaluation if the symptoms worsen or if the condition fails to improve as anticipated.  I provided 15 minutes of non-face-to-face time during this encounter.      Review of Systems     Objective:   Physical Exam  Unable to  do physical exam via video More than likely the tick bite is just caused a localized reaction/allergic reaction I doubt that there is any type of [facility] spotted fever but nonetheless we will go ahead and treat with doxycycline twice daily over the course the next 10 days In addition to this she will have albuterol on hand we talked about coronavirus protection Levothyroxine should take on a daily basis refills were sent in lab work come summertime      Assessment & Plan:  Hypothyroidism continue medication refills were sent and lab work in the summer Tick bite localized reaction doxycycline twice daily 10 days caution if any type fevers or other problems notify us Albuterol sent in  Patient was counseled to take part area gets worse or starts running fever to notify us immediately

## 2018-06-02 ENCOUNTER — Telehealth: Payer: Self-pay | Admitting: Family Medicine

## 2018-06-02 NOTE — Telephone Encounter (Signed)
Patient is out of Phenergan 25 mg.  She likes to keep it on hand in case she gets nauseated. She forgot to ask Dr. Lorin Picket for a refill when she had her phone visit the other day.  Patient told that Dr. Lorin Picket is not in the office today.   [facility] [employer]

## 2018-06-04 NOTE — Telephone Encounter (Signed)
Phenergan 25 mg, 1 twice daily PRN, #30, 3 refills, caution drowsiness

## 2018-06-05 MED ORDER — PROMETHAZINE HCL 25 MG PO TABS: ORAL_TABLET | ORAL | 3 refills | Status: DC

## 2018-06-05 NOTE — Telephone Encounter (Signed)
Medication sent in. Left message to return call 

## 2018-06-06 NOTE — Telephone Encounter (Signed)
Contacted patient and informed pt that med has been sent in. Pt verbalized understanding.

## 2018-07-03 DIAGNOSIS — Z79891 Long term (current) use of opiate analgesic: Secondary | ICD-10-CM | POA: Diagnosis not present

## 2018-07-03 DIAGNOSIS — Z5181 Encounter for therapeutic drug level monitoring: Secondary | ICD-10-CM | POA: Diagnosis not present

## 2018-07-03 DIAGNOSIS — G894 Chronic pain syndrome: Secondary | ICD-10-CM | POA: Diagnosis not present

## 2018-07-03 DIAGNOSIS — M5136 Other intervertebral disc degeneration, lumbar region: Secondary | ICD-10-CM | POA: Diagnosis not present

## 2018-07-23 ENCOUNTER — Other Ambulatory Visit: Payer: Self-pay | Admitting: Family Medicine

## 2018-07-27 NOTE — Telephone Encounter (Signed)
May have 1 refill needs virtual visit in June 

## 2018-08-02 DIAGNOSIS — G8929 Other chronic pain: Secondary | ICD-10-CM | POA: Diagnosis not present

## 2018-08-02 DIAGNOSIS — M5136 Other intervertebral disc degeneration, lumbar region: Secondary | ICD-10-CM | POA: Diagnosis not present

## 2018-08-02 DIAGNOSIS — M792 Neuralgia and neuritis, unspecified: Secondary | ICD-10-CM | POA: Diagnosis not present

## 2018-08-02 DIAGNOSIS — G43001 Migraine without aura, not intractable, with status migrainosus: Secondary | ICD-10-CM | POA: Diagnosis not present

## 2018-08-02 DIAGNOSIS — G894 Chronic pain syndrome: Secondary | ICD-10-CM | POA: Diagnosis not present

## 2018-08-02 DIAGNOSIS — M25559 Pain in unspecified hip: Secondary | ICD-10-CM | POA: Diagnosis not present

## 2018-08-02 DIAGNOSIS — M4726 Other spondylosis with radiculopathy, lumbar region: Secondary | ICD-10-CM | POA: Diagnosis not present

## 2018-08-02 DIAGNOSIS — Z5181 Encounter for therapeutic drug level monitoring: Secondary | ICD-10-CM | POA: Diagnosis not present

## 2018-08-02 DIAGNOSIS — Z79891 Long term (current) use of opiate analgesic: Secondary | ICD-10-CM | POA: Diagnosis not present

## 2018-08-02 DIAGNOSIS — M4807 Spinal stenosis, lumbosacral region: Secondary | ICD-10-CM | POA: Diagnosis not present

## 2018-08-02 DIAGNOSIS — M47897 Other spondylosis, lumbosacral region: Secondary | ICD-10-CM | POA: Diagnosis not present

## 2018-08-02 DIAGNOSIS — M48062 Spinal stenosis, lumbar region with neurogenic claudication: Secondary | ICD-10-CM | POA: Diagnosis not present

## 2018-08-30 DIAGNOSIS — Z5181 Encounter for therapeutic drug level monitoring: Secondary | ICD-10-CM | POA: Diagnosis not present

## 2018-08-30 DIAGNOSIS — Z79891 Long term (current) use of opiate analgesic: Secondary | ICD-10-CM | POA: Diagnosis not present

## 2018-08-30 DIAGNOSIS — M5136 Other intervertebral disc degeneration, lumbar region: Secondary | ICD-10-CM | POA: Diagnosis not present

## 2018-08-30 DIAGNOSIS — G894 Chronic pain syndrome: Secondary | ICD-10-CM | POA: Diagnosis not present

## 2018-09-27 DIAGNOSIS — M5136 Other intervertebral disc degeneration, lumbar region: Secondary | ICD-10-CM | POA: Diagnosis not present

## 2018-09-27 DIAGNOSIS — Z5181 Encounter for therapeutic drug level monitoring: Secondary | ICD-10-CM | POA: Diagnosis not present

## 2018-09-27 DIAGNOSIS — Z79891 Long term (current) use of opiate analgesic: Secondary | ICD-10-CM | POA: Diagnosis not present

## 2018-09-27 DIAGNOSIS — G894 Chronic pain syndrome: Secondary | ICD-10-CM | POA: Diagnosis not present

## 2018-09-28 ENCOUNTER — Other Ambulatory Visit: Payer: Self-pay | Admitting: Family Medicine

## 2018-09-28 NOTE — Telephone Encounter (Signed)
Thank you this +1 refill needs follow-up visit within the next 60 days

## 2018-10-10 ENCOUNTER — Telehealth: Payer: Self-pay | Admitting: Family Medicine

## 2018-10-10 ENCOUNTER — Other Ambulatory Visit: Payer: Self-pay | Admitting: *Deleted

## 2018-10-10 MED ORDER — PENICILLIN V POTASSIUM 500 MG PO TABS: 500.0000 mg | ORAL_TABLET | Freq: Four times a day (QID) | ORAL | 0 refills | Status: DC

## 2018-10-10 NOTE — Telephone Encounter (Signed)
Please advise. Thank you

## 2018-10-10 NOTE — Telephone Encounter (Signed)
[facility] [place] [facility] [address] instead of [place]. Med sent to pharm. Pt notified.

## 2018-10-10 NOTE — Telephone Encounter (Signed)
Wisdom tooth broke on Friday and pt is having pain going from right side jaw up to ear. Dentist cant pull it until Thursday at 1. She is hoping to have an antibiotic called in.   [id number] [place], [place] - [address]

## 2018-10-10 NOTE — Telephone Encounter (Signed)
Penicillin VK 500 mg, 1 tablet taken 4 times daily, #28, 7 days

## 2018-10-25 DIAGNOSIS — M5136 Other intervertebral disc degeneration, lumbar region: Secondary | ICD-10-CM | POA: Diagnosis not present

## 2018-10-25 DIAGNOSIS — Z79891 Long term (current) use of opiate analgesic: Secondary | ICD-10-CM | POA: Diagnosis not present

## 2018-10-25 DIAGNOSIS — G894 Chronic pain syndrome: Secondary | ICD-10-CM | POA: Diagnosis not present

## 2018-11-20 DIAGNOSIS — M792 Neuralgia and neuritis, unspecified: Secondary | ICD-10-CM | POA: Diagnosis not present

## 2018-11-20 DIAGNOSIS — G8929 Other chronic pain: Secondary | ICD-10-CM | POA: Diagnosis not present

## 2018-11-20 DIAGNOSIS — M47897 Other spondylosis, lumbosacral region: Secondary | ICD-10-CM | POA: Diagnosis not present

## 2018-11-22 DIAGNOSIS — M792 Neuralgia and neuritis, unspecified: Secondary | ICD-10-CM | POA: Diagnosis not present

## 2018-11-22 DIAGNOSIS — M4726 Other spondylosis with radiculopathy, lumbar region: Secondary | ICD-10-CM | POA: Diagnosis not present

## 2018-11-22 DIAGNOSIS — M25519 Pain in unspecified shoulder: Secondary | ICD-10-CM | POA: Diagnosis not present

## 2018-11-22 DIAGNOSIS — M4807 Spinal stenosis, lumbosacral region: Secondary | ICD-10-CM | POA: Diagnosis not present

## 2018-11-22 DIAGNOSIS — M48062 Spinal stenosis, lumbar region with neurogenic claudication: Secondary | ICD-10-CM | POA: Diagnosis not present

## 2018-11-22 DIAGNOSIS — G8929 Other chronic pain: Secondary | ICD-10-CM | POA: Diagnosis not present

## 2018-11-22 DIAGNOSIS — M47897 Other spondylosis, lumbosacral region: Secondary | ICD-10-CM | POA: Diagnosis not present

## 2018-11-22 DIAGNOSIS — M25559 Pain in unspecified hip: Secondary | ICD-10-CM | POA: Diagnosis not present

## 2018-12-17 ENCOUNTER — Other Ambulatory Visit: Payer: Self-pay | Admitting: Family Medicine

## 2018-12-19 NOTE — Telephone Encounter (Signed)
1 refill needs follow-up office visit or virtual visit

## 2018-12-20 DIAGNOSIS — M792 Neuralgia and neuritis, unspecified: Secondary | ICD-10-CM | POA: Diagnosis not present

## 2018-12-20 DIAGNOSIS — M47897 Other spondylosis, lumbosacral region: Secondary | ICD-10-CM | POA: Diagnosis not present

## 2018-12-20 DIAGNOSIS — G8929 Other chronic pain: Secondary | ICD-10-CM | POA: Diagnosis not present

## 2019-01-05 ENCOUNTER — Other Ambulatory Visit: Payer: Self-pay | Admitting: Family Medicine

## 2019-01-09 NOTE — Telephone Encounter (Signed)
May have 90-day needs to do a follow-up visit before the end of the year

## 2019-01-16 ENCOUNTER — Telehealth: Payer: Self-pay | Admitting: Family Medicine

## 2019-01-16 DIAGNOSIS — D72829 Elevated white blood cell count, unspecified: Secondary | ICD-10-CM

## 2019-01-16 DIAGNOSIS — N289 Disorder of kidney and ureter, unspecified: Secondary | ICD-10-CM

## 2019-01-16 DIAGNOSIS — E039 Hypothyroidism, unspecified: Secondary | ICD-10-CM

## 2019-01-16 NOTE — Telephone Encounter (Signed)
CBC, metabolic 7, TSH, free T4 Leukocytosis, renal insufficiency, hypothyroidism  As for the MRI the MRI is a very complex test to get approved by insurance It would require seeing the patient evaluating the patient Trying conservative measures including physical therapy and medications Then if no significant improvement over 8 to 12 weeks then we can order the MRI But all of that has to be under visits and supervision from the initial back pain through the 8 to 12 weeks  In other words we just cannot order an MRI because it will get approved

## 2019-01-16 NOTE — Telephone Encounter (Signed)
Patient is requesting MRI for lower back pain  And would like labs to get her thyroid checked . She had phone visit on 05/31/18 for tick bite. Please advise

## 2019-01-16 NOTE — Telephone Encounter (Signed)
Blood work ordered in [employer]. Patient notified and advised of process for ordering MRI and getting it approves. Patient will call back and schedule and appointment when she is ready to get the ball rolling on MRI.

## 2019-01-17 DIAGNOSIS — M5136 Other intervertebral disc degeneration, lumbar region: Secondary | ICD-10-CM | POA: Diagnosis not present

## 2019-01-17 DIAGNOSIS — G894 Chronic pain syndrome: Secondary | ICD-10-CM | POA: Diagnosis not present

## 2019-01-17 DIAGNOSIS — M48062 Spinal stenosis, lumbar region with neurogenic claudication: Secondary | ICD-10-CM | POA: Diagnosis not present

## 2019-02-14 DIAGNOSIS — M48062 Spinal stenosis, lumbar region with neurogenic claudication: Secondary | ICD-10-CM | POA: Diagnosis not present

## 2019-02-14 DIAGNOSIS — M5136 Other intervertebral disc degeneration, lumbar region: Secondary | ICD-10-CM | POA: Diagnosis not present

## 2019-02-14 DIAGNOSIS — G894 Chronic pain syndrome: Secondary | ICD-10-CM | POA: Diagnosis not present

## 2019-03-20 DIAGNOSIS — M5136 Other intervertebral disc degeneration, lumbar region: Secondary | ICD-10-CM | POA: Diagnosis not present

## 2019-03-20 DIAGNOSIS — M48062 Spinal stenosis, lumbar region with neurogenic claudication: Secondary | ICD-10-CM | POA: Diagnosis not present

## 2019-03-20 DIAGNOSIS — G894 Chronic pain syndrome: Secondary | ICD-10-CM | POA: Diagnosis not present

## 2019-03-20 DIAGNOSIS — Z79891 Long term (current) use of opiate analgesic: Secondary | ICD-10-CM | POA: Diagnosis not present

## 2019-03-30 ENCOUNTER — Other Ambulatory Visit: Payer: Self-pay | Admitting: Family Medicine

## 2019-03-30 NOTE — Telephone Encounter (Signed)
Scheduled 2/5

## 2019-03-30 NOTE — Telephone Encounter (Signed)
Please contact patient to set up appointment; then may route to nurses. Thank you

## 2019-03-30 NOTE — Telephone Encounter (Signed)
LAST MED CHECK UP 05/31/18

## 2019-03-31 NOTE — Telephone Encounter (Signed)
1 refill on all needs virtual visit

## 2019-04-06 ENCOUNTER — Ambulatory Visit (INDEPENDENT_AMBULATORY_CARE_PROVIDER_SITE_OTHER): Payer: Medicaid Other | Admitting: Family Medicine

## 2019-04-06 ENCOUNTER — Other Ambulatory Visit: Payer: Self-pay

## 2019-04-06 DIAGNOSIS — F329 Major depressive disorder, single episode, unspecified: Secondary | ICD-10-CM

## 2019-04-06 DIAGNOSIS — F419 Anxiety disorder, unspecified: Secondary | ICD-10-CM | POA: Diagnosis not present

## 2019-04-06 DIAGNOSIS — F32A Anxiety disorder, unspecified: Secondary | ICD-10-CM

## 2019-04-06 DIAGNOSIS — E038 Other specified hypothyroidism: Secondary | ICD-10-CM

## 2019-04-06 DIAGNOSIS — M5432 Sciatica, left side: Secondary | ICD-10-CM

## 2019-04-06 DIAGNOSIS — M5431 Sciatica, right side: Secondary | ICD-10-CM | POA: Diagnosis not present

## 2019-04-06 DIAGNOSIS — N3010 Interstitial cystitis (chronic) without hematuria: Secondary | ICD-10-CM | POA: Diagnosis not present

## 2019-04-06 MED ORDER — LEVOTHYROXINE SODIUM 88 MCG PO TABS: 88.0000 ug | ORAL_TABLET | Freq: Every day | ORAL | 6 refills | Status: DC

## 2019-04-06 MED ORDER — CEFDINIR 300 MG PO CAPS: 300.0000 mg | ORAL_CAPSULE | Freq: Two times a day (BID) | ORAL | 0 refills | Status: DC

## 2019-04-06 MED ORDER — DULOXETINE HCL 60 MG PO CPEP: 60.0000 mg | ORAL_CAPSULE | Freq: Every day | ORAL | 5 refills | Status: DC

## 2019-04-06 NOTE — Progress Notes (Signed)
   Subjective:    Patient ID: Kimberly Weiss, female    DOB: 1980-04-10, 39 y.o.   MRN: [id number]  HPI Pt needing med check up today. Pt states she is doing well. Pt states she has been having a sinus infection for about a week. Sinus pressure, swelling around eyes, ear pain and headache. She does relate a moderate sinus symptoms some head congestion drainage coughing denies wheezing difficulty breathing  She does have hypothyroidism takes her medicine regular basis states energy level doing okay denies any setbacks  She does have interstitial cystitis she takes her Elavil every evening she states that helps her greatly  She relates her depression under good control medication and would like to continue the current medicine Virtual Visit via Telephone Note  I connected with Kimberly Weiss on 04/06/19 at 10:00 AM EST by telephone and verified that I am speaking with the correct person using two identifiers.  Location: Patient: home Provider: office   I discussed the limitations, risks, security and privacy concerns of performing an evaluation and management service by telephone and the availability of in person appointments. I also discussed with the patient that there may be a patient responsible charge related to this service. The patient expressed understanding and agreed to proceed.   History of Present Illness:    Observations/Objective:   Assessment and Plan:   Follow Up Instructions:    I discussed the assessment and treatment plan with the patient. The patient was provided an opportunity to ask questions and all were answered. The patient agreed with the plan and demonstrated an understanding of the instructions.   The patient was advised to call back or seek an in-person evaluation if the symptoms worsen or if the condition fails to improve as anticipated.  I provided 25 minutes of non-face-to-face time during this encounter.       Review of Systems    Constitutional: Negative for activity change, appetite change and fatigue.  HENT: Negative for congestion and rhinorrhea.   Respiratory: Negative for cough and shortness of breath.   Cardiovascular: Negative for chest pain and leg swelling.  Gastrointestinal: Negative for abdominal pain and diarrhea.  Endocrine: Negative for polydipsia and polyphagia.  Skin: Negative for color change.  Neurological: Negative for dizziness and weakness.  Psychiatric/Behavioral: Negative for behavioral problems and confusion.       Objective:   Physical Exam  Today's visit was via telephone Physical exam was not possible for this visit       Assessment & Plan:  Depression overall doing well with medicine.  Patient under a lot of stress with Covid  Interstitial cystitis continue the Elavil as directed  Chronic low back pain is worse recently with a lot of back pain radiating into the legs.  Patient was interested in doing a up-to-date MRI she was informed to set up the appointment to be seen in the office for this-she will set this up in the near future  Mild sinus infection antibiotic sent

## 2019-04-16 DIAGNOSIS — G894 Chronic pain syndrome: Secondary | ICD-10-CM | POA: Diagnosis not present

## 2019-04-16 DIAGNOSIS — M5136 Other intervertebral disc degeneration, lumbar region: Secondary | ICD-10-CM | POA: Diagnosis not present

## 2019-05-01 ENCOUNTER — Other Ambulatory Visit: Payer: Self-pay | Admitting: Family Medicine

## 2019-05-15 DIAGNOSIS — M5136 Other intervertebral disc degeneration, lumbar region: Secondary | ICD-10-CM | POA: Diagnosis not present

## 2019-05-15 DIAGNOSIS — G894 Chronic pain syndrome: Secondary | ICD-10-CM | POA: Diagnosis not present

## 2019-06-11 DIAGNOSIS — M48062 Spinal stenosis, lumbar region with neurogenic claudication: Secondary | ICD-10-CM | POA: Diagnosis not present

## 2019-06-11 DIAGNOSIS — M5136 Other intervertebral disc degeneration, lumbar region: Secondary | ICD-10-CM | POA: Diagnosis not present

## 2019-06-11 DIAGNOSIS — G894 Chronic pain syndrome: Secondary | ICD-10-CM | POA: Diagnosis not present

## 2019-06-26 ENCOUNTER — Telehealth: Payer: Self-pay | Admitting: Family Medicine

## 2019-06-26 NOTE — Telephone Encounter (Signed)
She may keep appointment

## 2019-06-26 NOTE — Telephone Encounter (Signed)
Prescreened pt for tomorrow. She answered no to all prescreen questions but states she is having allergies.

## 2019-06-27 ENCOUNTER — Ambulatory Visit: Payer: Medicaid Other | Admitting: Family Medicine

## 2019-06-28 ENCOUNTER — Other Ambulatory Visit: Payer: Self-pay

## 2019-06-28 ENCOUNTER — Ambulatory Visit: Payer: Medicaid Other | Admitting: Family Medicine

## 2019-06-28 ENCOUNTER — Encounter: Payer: Self-pay | Admitting: Family Medicine

## 2019-06-28 VITALS — BP 118/76 | HR 84 | Temp 97.9°F | Ht 63.0 in | Wt 176.6 lb

## 2019-06-28 DIAGNOSIS — E039 Hypothyroidism, unspecified: Secondary | ICD-10-CM | POA: Diagnosis not present

## 2019-06-28 DIAGNOSIS — G8929 Other chronic pain: Secondary | ICD-10-CM | POA: Diagnosis not present

## 2019-06-28 DIAGNOSIS — M5432 Sciatica, left side: Secondary | ICD-10-CM | POA: Diagnosis not present

## 2019-06-28 DIAGNOSIS — M5431 Sciatica, right side: Secondary | ICD-10-CM

## 2019-06-28 DIAGNOSIS — M5442 Lumbago with sciatica, left side: Secondary | ICD-10-CM

## 2019-06-28 DIAGNOSIS — M5441 Lumbago with sciatica, right side: Secondary | ICD-10-CM

## 2019-06-28 DIAGNOSIS — N289 Disorder of kidney and ureter, unspecified: Secondary | ICD-10-CM | POA: Diagnosis not present

## 2019-06-28 DIAGNOSIS — D72829 Elevated white blood cell count, unspecified: Secondary | ICD-10-CM | POA: Diagnosis not present

## 2019-06-28 NOTE — Progress Notes (Signed)
Patient ID: Kimberly Weiss, female    DOB: 1980-10-31, 39 y.o.   MRN: [id number]   Chief Complaint  Patient presents with  . Back Pain   Subjective:   HPI  HPI   Pt having back pain that is chronic sciatica bilaterally.  Feels it has worsened.  Has been seeing pain management for past 3 yrs.  Has been on 10mg  oxycodone q6hrs prn pain.  She is also taking cymbalta and elavil for the pain. Her pain management doctor requested she see her pcp for follow up and imaging for her back, since her pain has worsened.  Pt stating no new injury or direct trauma to the back. She did have small fall where she caught herself 2 wks ago in her closet, but didn't hit her back or spine. Denies saddle anesthesia or bowel or bladder incontinence. No fever or rash.  Pt stating has had epidural injections in the past. Pt very tearful and upset that pain doctor won't increase her pain medications.   MRI- lumbar 5/18.  IMPRESSION: 1. Mildly progressive endplate degeneration at L4-5 and L5-S1 compared with prior study from last year. 2. Interval partial involution of right-sided disc protrusion at L4-5. No residual displacement of the right L5 nerve root in the canal. 3. Moderate to severe foraminal narrowing bilaterally at L5-S1 secondary to chronic L5 pars defects, grade 1 anterolisthesis and resulting anterolisthesis. Probable chronic bilateral L5 nerve root encroachment. This may worsen with standing or flexion.   Medical History Kimberly Weiss has a past medical history of Anemia, Anxiety, Chronic lower back pain, Daily headache, Depression, GERD (gastroesophageal reflux disease), GI bleed, History of blood transfusion (04/2014), History of duodenal ulcer (04/2014), History of kidney stones, Hypertension, Hypothyroidism, Interstitial cystitis, Pneumonia (~ 2008), Renal stones, and Thyroid disease.   Outpatient Encounter Medications as of 06/28/2019  Medication Sig  . Oxycodone HCl 10 MG TABS Take by  mouth. Every 6 hours via pain management DrCrisp  . albuterol (VENTOLIN HFA) 108 (90 Base) MCG/ACT inhaler INHALE 2 PUFFS INTO THE LUNGS EVERY 4 (FOUR) HOURS AS NEEDED FOR WHEEZING OR SHORTNESS OF BREATH.  Marland Kitchen amitriptyline (ELAVIL) 50 MG tablet TAKE 1 TABLET BY MOUTH EVERYDAY AT BEDTIME  . DULoxetine (CYMBALTA) 60 MG capsule Take 1 capsule (60 mg total) by mouth daily.  . fluticasone (FLONASE) 50 MCG/ACT nasal spray Place 1 spray into both nostrils daily.  . hydrochlorothiazide (HYDRODIURIL) 25 MG tablet TAKE 1/2 TO 1 TABLETS BY MOUTH EVERY MORNING AS NEEDED FOR SWELLING (Patient taking differently: Take 12.5-25 mg by mouth daily as needed (swelling). )  . potassium chloride (K-DUR) 10 MEQ tablet Take 1 tablet (10 mEq total) by mouth daily as needed. Only take when taking HCTZ  . promethazine (PHENERGAN) 25 MG tablet TAKE 1 TABLET BY MOUTH EVERY 8 HOURS AS NEEDED (Patient taking differently: Take 25 mg by mouth every 8 (eight) hours as needed for nausea or vomiting. )  . [DISCONTINUED] cefdinir (OMNICEF) 300 MG capsule Take 1 capsule (300 mg total) by mouth 2 (two) times daily.  . [DISCONTINUED] cetirizine (ZYRTEC) 10 MG tablet Take 1 tablet (10 mg total) by mouth daily.  . [DISCONTINUED] levothyroxine (SYNTHROID) 88 MCG tablet Take 1 tablet (88 mcg total) by mouth daily.   No facility-administered encounter medications on file as of 06/28/2019.     Review of Systems  Constitutional: Negative for chills and fever.  HENT: Negative for congestion, rhinorrhea and sore throat.   Respiratory: Negative for cough, shortness of breath  and wheezing.   Cardiovascular: Negative for chest pain and leg swelling.  Gastrointestinal: Negative for abdominal pain, diarrhea, nausea and vomiting.  Genitourinary: Negative for difficulty urinating, dysuria, enuresis, frequency and hematuria.  Musculoskeletal: Positive for back pain (chronic ) and gait problem. Negative for arthralgias, neck pain and neck stiffness.    Skin: Negative for rash and wound.  Neurological: Negative for dizziness, weakness and headaches.     Vitals BP 118/76   Pulse 84   Temp 97.9 F (36.6 C) (Oral)   Ht 5\' 3"  (1.6 m)   Wt 176 lb 9.6 oz (80.1 kg)   SpO2 98%   BMI 31.28 kg/m   Objective:   Physical Exam Vitals and nursing note reviewed.  Constitutional:      Appearance: Normal appearance.  HENT:     Head: Normocephalic and atraumatic.     Nose: Nose normal.     Mouth/Throat:     Mouth: Mucous membranes are moist.     Pharynx: Oropharynx is clear.  Eyes:     Extraocular Movements: Extraocular movements intact.     Conjunctiva/sclera: Conjunctivae normal.     Pupils: Pupils are equal, round, and reactive to light.  Cardiovascular:     Rate and Rhythm: Normal rate and regular rhythm.     Pulses: Normal pulses.     Heart sounds: Normal heart sounds.  Pulmonary:     Effort: Pulmonary effort is normal.     Breath sounds: Normal breath sounds. No wheezing, rhonchi or rales.  Musculoskeletal:        General: Normal range of motion.       Back:     Right lower leg: No edema.     Left lower leg: No edema.  Skin:    General: Skin is warm and dry.     Findings: No lesion or rash.  Neurological:     General: No focal deficit present.     Mental Status: She is alert and oriented to person, place, and time.  Psychiatric:        Mood and Affect: Mood is anxious. Affect is tearful.        Behavior: Behavior normal.     Comments: Tearfulness in room and out of proportion to the exam.      Assessment and Plan   1. Bilateral sciatica - MR Lumbar Spine Wo Contrast; Future  2. Chronic bilateral low back pain with bilateral sciatica - MR Lumbar Spine Wo Contrast; Future   Concern that there may be malingering per her exam.  The tearfulness and emotion was out of proportion to the exam. Will order the images since pt stating more pain and was per the request of the pain management doctor.   Gave otc med  treatment plan.  Tylenol/ibuprofen with food prn. Heat/ice to back.  otc lidocaine patches prn.  Pt to continue with pain medications per pain management.  Pt in agreement.  F/u prn.

## 2019-06-29 LAB — CBC WITH DIFFERENTIAL/PLATELET
Basophils Absolute: 0.1 10*3/uL (ref 0.0–0.2)
Basos: 1 %
EOS (ABSOLUTE): 0.7 10*3/uL — ABNORMAL HIGH (ref 0.0–0.4)
Eos: 5 %
Hematocrit: 45.6 % (ref 34.0–46.6)
Hemoglobin: 14.7 g/dL (ref 11.1–15.9)
Immature Grans (Abs): 0 10*3/uL (ref 0.0–0.1)
Immature Granulocytes: 0 %
Lymphocytes Absolute: 3.3 10*3/uL — ABNORMAL HIGH (ref 0.7–3.1)
Lymphs: 24 %
MCH: 28.7 pg (ref 26.6–33.0)
MCHC: 32.2 g/dL (ref 31.5–35.7)
MCV: 89 fL (ref 79–97)
Monocytes Absolute: 1 10*3/uL — ABNORMAL HIGH (ref 0.1–0.9)
Monocytes: 7 %
Neutrophils Absolute: 8.9 10*3/uL — ABNORMAL HIGH (ref 1.4–7.0)
Neutrophils: 63 %
Platelets: 370 10*3/uL (ref 150–450)
RBC: 5.13 x10E6/uL (ref 3.77–5.28)
RDW: 14.2 % (ref 11.7–15.4)
WBC: 14 10*3/uL — ABNORMAL HIGH (ref 3.4–10.8)

## 2019-06-29 LAB — BASIC METABOLIC PANEL
BUN/Creatinine Ratio: 13 (ref 9–23)
BUN: 11 mg/dL (ref 6–20)
CO2: 22 mmol/L (ref 20–29)
Calcium: 9.5 mg/dL (ref 8.7–10.2)
Chloride: 100 mmol/L (ref 96–106)
Creatinine, Ser: 0.84 mg/dL (ref 0.57–1.00)
GFR calc Af Amer: 101 mL/min/{1.73_m2} (ref >59)
GFR calc non Af Amer: 88 mL/min/{1.73_m2} (ref >59)
Glucose: 89 mg/dL (ref 65–99)
Potassium: 4.4 mmol/L (ref 3.5–5.2)
Sodium: 136 mmol/L (ref 134–144)

## 2019-06-29 LAB — T4, FREE: Free T4: 0.99 ng/dL (ref 0.82–1.77)

## 2019-06-29 LAB — TSH: TSH: 8.92 u[IU]/mL — ABNORMAL HIGH (ref 0.450–4.500)

## 2019-07-02 ENCOUNTER — Telehealth: Payer: Self-pay | Admitting: Family Medicine

## 2019-07-02 ENCOUNTER — Telehealth (INDEPENDENT_AMBULATORY_CARE_PROVIDER_SITE_OTHER): Payer: Medicaid Other | Admitting: Family Medicine

## 2019-07-02 ENCOUNTER — Other Ambulatory Visit: Payer: Self-pay | Admitting: *Deleted

## 2019-07-02 ENCOUNTER — Other Ambulatory Visit: Payer: Self-pay

## 2019-07-02 ENCOUNTER — Encounter: Payer: Self-pay | Admitting: Family Medicine

## 2019-07-02 DIAGNOSIS — E039 Hypothyroidism, unspecified: Secondary | ICD-10-CM

## 2019-07-02 DIAGNOSIS — J0110 Acute frontal sinusitis, unspecified: Secondary | ICD-10-CM

## 2019-07-02 MED ORDER — LEVOTHYROXINE SODIUM 100 MCG PO TABS
100.0000 ug | ORAL_TABLET | Freq: Every day | ORAL | 5 refills | Status: DC
Start: 2019-07-02 — End: 2020-02-11

## 2019-07-02 MED ORDER — CEFDINIR 300 MG PO CAPS: 300.0000 mg | ORAL_CAPSULE | Freq: Two times a day (BID) | ORAL | 0 refills | Status: DC

## 2019-07-02 NOTE — Progress Notes (Signed)
Patient ID: Kimberly Weiss, female    DOB: [birth year], 39 y.o.   MRN: [id number]   Chief Complaint  Patient presents with  . Sinusitis    Pt is having cough, nausea, low grade fever possibly, neck and head heaviness, sinus pain. Pt has tried all OTC allergy mediations. Pt is currently taking Allegra. Pt wanting to know if there is anything that can be prescribed via prescription.     Virtual Visit via Telephone Note  I connected with Kimberly Weiss on 07/02/19 at 10:30 AM EDT by telephone and verified that I am speaking with the correct person using two identifiers.  Location: Patient: home Provider: office   I discussed the limitations, risks, security and privacy concerns of performing an evaluation and management service by telephone and the availability of in person appointments. I also discussed with the patient that there may be a patient responsible charge related to this service. The patient expressed understanding and agreed to proceed.  Subjective:   HPI  HPI   Pt had phone visit for sinus concerns.  Started with seasonal allergies, that have worsened into sinus pain/pressure.  Having sinus pain and coughing. Has been going on for about 3 wks.  Has been using allergy meds.  Fever low grade the last couple days.  Sinus pain and pressure now.  Having post nasal drip.  Having sinus painful and swollen. No runny nose. Having some nausea.  Mild coughing from drainage. Decrease in appetite.  No sore throat.   Medical History Kimberly Weiss has a past medical history of Anemia, Anxiety, Chronic lower back pain, Daily headache, Depression, GERD (gastroesophageal reflux disease), GI bleed, History of blood transfusion (04/2014), History of duodenal ulcer (04/2014), History of kidney stones, Hypertension, Hypothyroidism, Interstitial cystitis, Pneumonia (~ 2008), Renal stones, and Thyroid disease.   Outpatient Encounter Medications as of 07/02/2019  Medication Sig  .  albuterol (VENTOLIN HFA) 108 (90 Base) MCG/ACT inhaler INHALE 2 PUFFS INTO THE LUNGS EVERY 4 (FOUR) HOURS AS NEEDED FOR WHEEZING OR SHORTNESS OF BREATH.  Marland Kitchen amitriptyline (ELAVIL) 50 MG tablet TAKE 1 TABLET BY MOUTH EVERYDAY AT BEDTIME  . DULoxetine (CYMBALTA) 60 MG capsule Take 1 capsule (60 mg total) by mouth daily.  . fluticasone (FLONASE) 50 MCG/ACT nasal spray Place 1 spray into both nostrils daily.  . hydrochlorothiazide (HYDRODIURIL) 25 MG tablet TAKE 1/2 TO 1 TABLETS BY MOUTH EVERY MORNING AS NEEDED FOR SWELLING (Patient taking differently: Take 12.5-25 mg by mouth daily as needed (swelling). )  . levothyroxine (SYNTHROID) 88 MCG tablet Take 1 tablet (88 mcg total) by mouth daily.  . Oxycodone HCl 10 MG TABS Take by mouth. Every 6 hours via pain management DrCrisp  . potassium chloride (K-DUR) 10 MEQ tablet Take 1 tablet (10 mEq total) by mouth daily as needed. Only take when taking HCTZ  . promethazine (PHENERGAN) 25 MG tablet TAKE 1 TABLET BY MOUTH EVERY 8 HOURS AS NEEDED (Patient taking differently: Take 25 mg by mouth every 8 (eight) hours as needed for nausea or vomiting. )  . cefdinir (OMNICEF) 300 MG capsule Take 1 capsule (300 mg total) by mouth 2 (two) times daily.  . [DISCONTINUED] cefdinir (OMNICEF) 300 MG capsule Take 1 capsule (300 mg total) by mouth 2 (two) times daily.  . [DISCONTINUED] cetirizine (ZYRTEC) 10 MG tablet Take 1 tablet (10 mg total) by mouth daily.   No facility-administered encounter medications on file as of 07/02/2019.     Review of Systems  Constitutional: Positive  for fatigue and fever. Negative for chills.  HENT: Positive for congestion, postnasal drip, sinus pressure and sinus pain. Negative for ear discharge, ear pain, facial swelling, nosebleeds, rhinorrhea and sore throat.   Eyes: Negative for pain, discharge, redness and itching.  Respiratory: Negative for cough.   Gastrointestinal: Positive for nausea. Negative for constipation, diarrhea and  vomiting.  Neurological: Positive for headaches.     Vitals There were no vitals taken for this visit.  Objective:   Physical Exam  No PE was phone visit.  Assessment and Plan   1. Acute non-recurrent frontal sinusitis - cefdinir (OMNICEF) 300 MG capsule; Take 1 capsule (300 mg total) by mouth 2 (two) times daily.  Dispense: 20 capsule; Refill: 0    Sinusitis- Continue with flonase.  Add saline nasal rinses 1-2x per day.  Take cefdinir till finished. Decrease the allegra and use more saline rinses for next few days to help with sinus pressure.  Tylenol/ibuprofen for pain and fever.   Pt in agreement.  F/u prn.    Follow Up Instructions:    I discussed the assessment and treatment plan with the patient. The patient was provided an opportunity to ask questions and all were answered. The patient agreed with the plan and demonstrated an understanding of the instructions.   The patient was advised to call back or seek an in-person evaluation if the symptoms worsen or if the condition fails to improve as anticipated.  I provided 15 minutes of non-face-to-face time during this encounter.   Michell Giuliano Orie Fisherman, DO

## 2019-07-02 NOTE — Telephone Encounter (Signed)
Pt had an OV with Dr. Ladona Ridgel on 06/28/2019, need OV note complete so that MRI prior auth request can be processed  Please advise

## 2019-07-02 NOTE — Telephone Encounter (Signed)
Ms. tyarra, nolton are scheduled for a virtual visit with your provider today.    Just as we do with appointments in the office, we must obtain your consent to participate.  Your consent will be active for this visit and any virtual visit you may have with one of our providers in the next 365 days.    If you have a MyChart account, I can also send a copy of this consent to you electronically.  All virtual visits are billed to your insurance company just like a traditional visit in the office.  As this is a virtual visit, video technology does not allow for your provider to perform a traditional examination.  This may limit your provider's ability to fully assess your condition.  If your provider identifies any concerns that need to be evaluated in person or the need to arrange testing such as labs, EKG, etc, we will make arrangements to do so.    Although advances in technology are sophisticated, we cannot ensure that it will always work on either your end or our end.  If the connection with a video visit is poor, we may have to switch to a telephone visit.  With either a video or telephone visit, we are not always able to ensure that we have a secure connection.   I need to obtain your verbal consent now.   Are you willing to proceed with your visit today?   TERRILL WAUTERS has provided verbal consent on 07/02/2019 for a virtual visit (video or telephone).   Marlowe Shores, LPN 07/04/2561  8:93 AM

## 2019-07-03 ENCOUNTER — Other Ambulatory Visit: Payer: Self-pay | Admitting: *Deleted

## 2019-07-03 DIAGNOSIS — D7219 Other eosinophilia: Secondary | ICD-10-CM

## 2019-07-03 NOTE — Telephone Encounter (Signed)
Yes, note is complete. Thx.   Dr. Karie Schwalbe

## 2019-07-06 ENCOUNTER — Other Ambulatory Visit: Payer: Self-pay | Admitting: Family Medicine

## 2019-07-06 DIAGNOSIS — D72829 Elevated white blood cell count, unspecified: Secondary | ICD-10-CM

## 2019-07-09 DIAGNOSIS — M5136 Other intervertebral disc degeneration, lumbar region: Secondary | ICD-10-CM | POA: Diagnosis not present

## 2019-07-09 DIAGNOSIS — G894 Chronic pain syndrome: Secondary | ICD-10-CM | POA: Diagnosis not present

## 2019-07-09 DIAGNOSIS — Z79891 Long term (current) use of opiate analgesic: Secondary | ICD-10-CM | POA: Diagnosis not present

## 2019-07-10 ENCOUNTER — Telehealth: Payer: Self-pay | Admitting: Family Medicine

## 2019-07-10 NOTE — Telephone Encounter (Signed)
MRI got denied by her insurance.  We are calling and letting her know.  And she needs to follow up with her pain specialist and have them order the MRI if still needed.   Thx,   Dr. Ladona Ridgel

## 2019-07-10 NOTE — Telephone Encounter (Signed)
This is essentially the pain management doctor not following through on what they should be doing I recommend that they do the referral for that area since they are managing this patient for these issues thank you

## 2019-07-10 NOTE — Telephone Encounter (Signed)
Pt returned call. Pt would like me to let Dr.Scott know that she is sorry, she was just passing the message along and was doing as she was told by pain doctor. Pt states that she has no idea on what doctor can order what.  Pt states she will call pain management and let them know.

## 2019-07-10 NOTE — Telephone Encounter (Signed)
Please advise. Thank you

## 2019-07-10 NOTE — Telephone Encounter (Signed)
Pls let pt know that it was denied.  If her pain specialist needs this then they need to order it and send documentation of the treatments for past years, since we don't have their notes.   Thx,   Dr. Ladona Ridgel

## 2019-07-10 NOTE — Telephone Encounter (Signed)
The main issue Is the specialist who is managing the pain should be able to order all of these tests (and not send this to [place])

## 2019-07-10 NOTE — Telephone Encounter (Signed)
Left message to return call 

## 2019-07-10 NOTE — Telephone Encounter (Signed)
Patient notes came in from pain clinic but stopped at page 9 out of 74 pages. I called to get the rest but was told to go to their web site  On the bottom of page one to see the rest of the notes. Its highlighted.

## 2019-07-10 NOTE — Telephone Encounter (Signed)
Prior auth request for MRI was DENIED  Please see denial letter (in red folder on desk corner) & please advise

## 2019-07-10 NOTE — Telephone Encounter (Signed)
See other phone message from 07/10/2019

## 2019-07-10 NOTE — Telephone Encounter (Signed)
Pt verbalized understanding.

## 2019-07-10 NOTE — Telephone Encounter (Signed)
Spoke with patient, explained that the MRI was scheduled before the prior auth process & unfortunately insurance denied the request.  Explained to the patient that due to denial from insurance that she would have to pay out of pocket for MRI if she chooses to go have the scan.  Explained to the patient that her pain management specialist could order the MRI due to they've been following & treating her for the past 3 years  As the conversation was ending patient stated that she saw her pain management specialist yesterday & they recommended that we as her PCP order physical therapy.  Explained to the patient that I would send a message to ask & I could not say whether or not we would order, it should be ordered by pain management.  Pt verbalized understanding & wants me to ask   (pt does have [place] [employer] & PCP manages/coordinates care so a referral from our office would be needed)  Please advise & call pt [phone]

## 2019-07-10 NOTE — Telephone Encounter (Signed)
I would forward this to Dr. Lorin Picket, since this is his patient and not sure if he wants to get involved in the pain aspect of this pt's care, and if pain specialist is requesting things, he should be the one ordering them.   Thanks,   Dr. Karie Schwalbe

## 2019-07-11 ENCOUNTER — Other Ambulatory Visit: Payer: Self-pay | Admitting: Family Medicine

## 2019-07-15 ENCOUNTER — Other Ambulatory Visit: Payer: Medicaid Other

## 2019-08-02 ENCOUNTER — Other Ambulatory Visit: Payer: Medicaid Other

## 2019-08-06 ENCOUNTER — Other Ambulatory Visit: Payer: Self-pay | Admitting: Family Medicine

## 2019-08-06 DIAGNOSIS — G894 Chronic pain syndrome: Secondary | ICD-10-CM | POA: Diagnosis not present

## 2019-08-06 DIAGNOSIS — Z79891 Long term (current) use of opiate analgesic: Secondary | ICD-10-CM | POA: Diagnosis not present

## 2019-08-06 DIAGNOSIS — M5136 Other intervertebral disc degeneration, lumbar region: Secondary | ICD-10-CM | POA: Diagnosis not present

## 2019-08-15 ENCOUNTER — Other Ambulatory Visit: Payer: Self-pay

## 2019-08-15 ENCOUNTER — Ambulatory Visit
Admission: RE | Admit: 2019-08-15 | Discharge: 2019-08-15 | Disposition: A | Discharge status: Home / Self Care | Payer: Medicaid Other | Source: Ambulatory Visit | Attending: Family Medicine | Admitting: Family Medicine

## 2019-08-15 DIAGNOSIS — G8929 Other chronic pain: Secondary | ICD-10-CM

## 2019-08-15 DIAGNOSIS — M5432 Sciatica, left side: Secondary | ICD-10-CM

## 2019-08-15 DIAGNOSIS — M48061 Spinal stenosis, lumbar region without neurogenic claudication: Secondary | ICD-10-CM | POA: Diagnosis not present

## 2019-08-15 DIAGNOSIS — M5431 Sciatica, right side: Secondary | ICD-10-CM

## 2019-08-16 ENCOUNTER — Other Ambulatory Visit: Payer: Self-pay | Admitting: Family Medicine

## 2019-09-03 DIAGNOSIS — Z79891 Long term (current) use of opiate analgesic: Secondary | ICD-10-CM | POA: Diagnosis not present

## 2019-09-03 DIAGNOSIS — G894 Chronic pain syndrome: Secondary | ICD-10-CM | POA: Diagnosis not present

## 2019-09-03 DIAGNOSIS — M5136 Other intervertebral disc degeneration, lumbar region: Secondary | ICD-10-CM | POA: Diagnosis not present

## 2019-09-24 ENCOUNTER — Ambulatory Visit (HOSPITAL_COMMUNITY)
Admission: EM | Admit: 2019-09-24 | Discharge: 2019-09-24 | Disposition: A | Discharge status: Home / Self Care | Payer: Medicaid Other | Attending: Family Medicine | Admitting: Family Medicine

## 2019-09-24 ENCOUNTER — Other Ambulatory Visit: Payer: Self-pay

## 2019-09-24 ENCOUNTER — Encounter (HOSPITAL_COMMUNITY): Payer: Self-pay

## 2019-09-24 DIAGNOSIS — S025XXA Fracture of tooth (traumatic), initial encounter for closed fracture: Secondary | ICD-10-CM

## 2019-09-24 DIAGNOSIS — K047 Periapical abscess without sinus: Secondary | ICD-10-CM | POA: Diagnosis not present

## 2019-09-24 MED ORDER — CLINDAMYCIN HCL 300 MG PO CAPS
300.0000 mg | ORAL_CAPSULE | Freq: Three times a day (TID) | ORAL | 0 refills | Status: DC
Start: 2019-09-24 — End: 2020-01-01

## 2019-09-24 NOTE — ED Provider Notes (Signed)
[facility]    CSN: [id number] Arrival date & time: 09/24/19  1535      History   Chief Complaint Chief Complaint  Patient presents with  . Dental Pain    HPI Kimberly Weiss is a 38 y.o. female.   HPI  Patient is here for dental pain She has no dental fractures She thinks she has a dental abscess She has an appointment with a dentist next week She states that her pain is not well relieved by Advil I did mention to her that she is already narcotic contract and takes oxycodone several times a day.  Patient states that this is not helping her pain.  I told her I was not giving her any more narcotics.  Past Medical History:  Diagnosis Date  . Anemia   . Anxiety   . Chronic lower back pain   . Daily headache    "related to stress and sinus" (02/20/2015)  . Depression   . GERD (gastroesophageal reflux disease)   . GI bleed   . History of blood transfusion 04/2014   "related to duodenal ulcer"  . History of duodenal ulcer 04/2014   "perforated"  . History of kidney stones   . Hypertension   . Hypothyroidism   . Interstitial cystitis   . Pneumonia ~ 2008  . Renal stones   . Thyroid disease     Patient Active Problem List   Diagnosis Date Noted  . Bilateral sciatica 06/16/2015  . S/P laparoscopic hernia repair 02/20/2015  . Hypocalcemia 06/03/2014  . Diarrhea 05/16/2014  . Chronic narcotic use 05/16/2014  . Duodenal ulcer with hemorrhage   . Hemorrhagic shock (HCC)   . Gastrointestinal hemorrhage associated with duodenal ulcer   . GI bleed   . Other specified hypotension   . Duodenal ulcer   . Hypovolemic shock (HCC)   . Tobacco abuse   . Other specified hypothyroidism   . Anxiety state   . Head trauma   . Tachycardia   . UGIB (upper gastrointestinal bleed) 04/27/2014  . Acute blood loss anemia   . Anxiety and depression 11/03/2012  . Dysfunctional uterine bleeding 06/10/2012  . Interstitial cystitis 05/22/2012  . Hypothyroidism 05/22/2012      Past Surgical History:  Procedure Laterality Date  . BLADDER INSTILLATION  2004  . CESAREAN SECTION  05/13/2004  . ESOPHAGOGASTRODUODENOSCOPY N/A 04/27/2014   Procedure: ESOPHAGOGASTRODUODENOSCOPY (EGD);  Surgeon: Shirley Friar, MD;  Location: [facility] ENDOSCOPY;  Service: Endoscopy;  Laterality: N/A;  . ESOPHAGOGASTRODUODENOSCOPY (EGD) WITH PROPOFOL N/A 05/03/2014   Procedure: ESOPHAGOGASTRODUODENOSCOPY (EGD) WITH PROPOFOL;  Surgeon: Shirley Friar, MD;  Location: [facility];  Service: Endoscopy;  Laterality: N/A;  . HERNIA REPAIR    . INCISIONAL HERNIA REPAIR N/A 02/20/2015   Procedure: LAPAROSCOPIC INCISIONAL HERNIA;  Surgeon: Violeta Gelinas, MD;  Location: [facility] OR;  Service: General;  Laterality: N/A;  . INSERTION OF MESH N/A 02/20/2015   Procedure: INSERTION OF MESH;  Surgeon: Violeta Gelinas, MD;  Location: [facility] OR;  Service: General;  Laterality: N/A;  . LAPAROSCOPIC INCISIONAL / UMBILICAL / VENTRAL HERNIA REPAIR  02/20/2015   IHR w/mesh & LOA  . LAPAROTOMY N/A 05/07/2014   Procedure: EXPLORATORY LAPAROTOMY;  Surgeon: Violeta Gelinas, MD;  Location: [facility] OR;  Service: General;  Laterality: N/A;  . LITHOTRIPSY  2004  . NASAL SINUS SURGERY  05/17/2008   "removed bone spurs in my sinus cavity"  . REPAIR OF PERFORATED ULCER N/A 05/07/2014   Procedure: OVERSOWE DUODENAL  ULCER;  Surgeon: Violeta Gelinas, MD;  Location: [facility] OR;  Service: General;  Laterality: N/A;  . TONSILLECTOMY  05/17/2008    OB History   No obstetric history on file.      Home Medications    Prior to Admission medications   Medication Sig Start Date End Date Taking? Authorizing Provider  albuterol (VENTOLIN HFA) 108 (90 Base) MCG/ACT inhaler INHALE 2 PUFFS INTO THE LUNGS EVERY 4 (FOUR) HOURS AS NEEDED FOR WHEEZING OR SHORTNESS OF BREATH. 04/02/19   Babs Sciara, MD  amitriptyline (ELAVIL) 50 MG tablet TAKE 1 TABLET BY MOUTH EVERYDAY AT BEDTIME 05/01/19   Luking, Jonna Coup, MD  cefdinir (OMNICEF) 300 MG capsule Take 1  capsule (300 mg total) by mouth 2 (two) times daily. 07/02/19   Laroy Apple M, DO  clindamycin (CLEOCIN) 300 MG capsule Take 1 capsule (300 mg total) by mouth 3 (three) times daily. 09/24/19   Eustace Moore, MD  DULoxetine (CYMBALTA) 60 MG capsule Take 1 capsule (60 mg total) by mouth daily. 04/06/19   Babs Sciara, MD  fluticasone (FLONASE) 50 MCG/ACT nasal spray Place 1 spray into both nostrils daily. 04/24/18   Bast, Gloris Manchester A, NP  hydrochlorothiazide (HYDRODIURIL) 25 MG tablet TAKE 1/2 TO 1 TABLETS BY MOUTH EVERY MORNING AS NEEDED FOR SWELLING Patient taking differently: Take 12.5-25 mg by mouth daily as needed (swelling).  11/22/17   Babs Sciara, MD  levothyroxine (SYNTHROID) 100 MCG tablet Take 1 tablet (100 mcg total) by mouth daily. 07/02/19   Babs Sciara, MD  Oxycodone HCl 10 MG TABS Take by mouth. Every 6 hours via pain management DrCrisp    [provider]  potassium chloride (K-DUR) 10 MEQ tablet Take 1 tablet (10 mEq total) by mouth daily as needed. Only take when taking HCTZ 07/27/18   Luking, Jonna Coup, MD  promethazine (PHENERGAN) 25 MG tablet TAKE 1 TABLET BY MOUTH EVERY 8 HOURS AS NEEDED Patient taking differently: Take 25 mg by mouth every 8 (eight) hours as needed for nausea or vomiting.  06/29/16   Babs Sciara, MD    Family History Family History  Problem Relation Age of Onset  . Heart Problems Mother   . Heart Problems Father     Social History Social History   Tobacco Use  . Smoking status: Current Every Day Smoker    Packs/day: 1.50    Years: 20.00    Pack years: 30.00    Types: Cigarettes  . Smokeless tobacco: Never Used  Substance Use Topics  . Alcohol use: No  . Drug use: No     Allergies   Gabapentin, Zofran [ondansetron hcl], and Opana [oxymorphone hcl]   Review of Systems Review of Systems  See HPI Physical Exam Triage Vital Signs ED Triage Vitals  Enc Vitals Group     BP 09/24/19 1647 110/69     Pulse Rate 09/24/19 1647  93     Resp 09/24/19 1647 18     Temp 09/24/19 1647 98.9 F (37.2 C)     Temp Source 09/24/19 1647 Oral     SpO2 09/24/19 1647 100 %     Weight 09/24/19 1645 166 lb (75.3 kg)     Height 09/24/19 1657 5\' 3"  (1.6 m)     Head Circumference --      Peak Flow --      Pain Score 09/24/19 1645 9     Pain Loc --      Pain  Edu? --      Excl. in GC? --    No data found.  Updated Vital Signs BP 110/69 (BP Location: Right Arm)   Pulse 93   Temp 98.9 F (37.2 C) (Oral)   Resp 18   Ht 5\' 3"  (1.6 m)   Wt 75.3 kg   LMP 09/23/2019   SpO2 100%   BMI 29.41 kg/m      Physical Exam Nursing note reviewed.  Constitutional:      General: She is not in acute distress.    Appearance: She is well-developed.  HENT:     Head: Normocephalic and atraumatic.     Mouth/Throat:     Comments: Poor dentition with multiple fractures Eyes:     Conjunctiva/sclera: Conjunctivae normal.     Pupils: Pupils are equal, round, and reactive to light.  Cardiovascular:     Rate and Rhythm: Normal rate.  Pulmonary:     Effort: Pulmonary effort is normal. No respiratory distress.  Abdominal:     General: There is no distension.     Palpations: Abdomen is soft.  Musculoskeletal:        General: Normal range of motion.     Cervical back: Normal range of motion.  Skin:    General: Skin is warm and dry.  Neurological:     Mental Status: She is alert.  Psychiatric:        Mood and Affect: Mood normal.        Behavior: Behavior normal.      UC Treatments / Results  Labs (all labs ordered are listed, but only abnormal results are displayed) Labs Reviewed - No data to display  EKG   Radiology No results found.  Procedures Procedures (including critical care time)  Medications Ordered in UC Medications - No data to display  Initial Impression / Assessment and Plan / UC Course  I have reviewed the triage vital signs and the nursing notes.  Pertinent labs & imaging results that were available  during my care of the patient were reviewed by me and considered in my medical decision making (see chart for details).     Patient is given clindamycin for infection.  She states this worked well for her in the past.  We discussed pain management with ibuprofen and Tylenol.  Mentions to me that if I give her a narcotic medication she could call her pain clinic tomorrow for an excuse/permission.  I told her that she also could call her pain medicine doctors today and let them know that she needs an increase in her medicine Final Clinical Impressions(s) / UC Diagnoses   Final diagnoses:  Dental infection  Closed fracture of tooth, initial encounter     Discharge Instructions     May take Aleve 2 pills in the morning and 2 in the evening Take clindamycin 3 times a day Call your pain provider if you feel like you need more narcotic See your dentist as scheduled   ED Prescriptions    Medication Sig Dispense Auth. Provider   clindamycin (CLEOCIN) 300 MG capsule Take 1 capsule (300 mg total) by mouth 3 (three) times daily. 30 capsule 09/25/2019, MD     PDMP not reviewed this encounter.   Eustace Moore, MD 09/24/19 2211

## 2019-09-24 NOTE — Discharge Instructions (Signed)
May take Aleve 2 pills in the morning and 2 in the evening Take clindamycin 3 times a day Call your pain provider if you feel like you need more narcotic See your dentist as scheduled

## 2019-09-24 NOTE — ED Triage Notes (Signed)
Pt is here with a tooth abscess that started a week ago, pt has taken Advil to relieve discomfort.

## 2019-10-01 DIAGNOSIS — Z79891 Long term (current) use of opiate analgesic: Secondary | ICD-10-CM | POA: Diagnosis not present

## 2019-10-01 DIAGNOSIS — M5136 Other intervertebral disc degeneration, lumbar region: Secondary | ICD-10-CM | POA: Diagnosis not present

## 2019-10-01 DIAGNOSIS — G894 Chronic pain syndrome: Secondary | ICD-10-CM | POA: Diagnosis not present

## 2019-10-23 ENCOUNTER — Telehealth: Payer: Self-pay | Admitting: Family Medicine

## 2019-10-23 DIAGNOSIS — M5441 Lumbago with sciatica, right side: Secondary | ICD-10-CM

## 2019-10-23 DIAGNOSIS — M5431 Sciatica, right side: Secondary | ICD-10-CM

## 2019-10-23 DIAGNOSIS — G8929 Other chronic pain: Secondary | ICD-10-CM

## 2019-10-23 DIAGNOSIS — M5432 Sciatica, left side: Secondary | ICD-10-CM

## 2019-10-23 MED ORDER — HYDROCHLOROTHIAZIDE 25 MG PO TABS: ORAL_TABLET | ORAL | 0 refills | Status: DC

## 2019-10-23 NOTE — Telephone Encounter (Signed)
Pt is checking on status of neurosurgery referral.   Also needing refill on hydrochlorothiazide (HYDRODIURIL) 25 MG tablet  [id number] - [place], [place] - [address]

## 2019-10-23 NOTE — Telephone Encounter (Signed)
Refilled hctz. It looks like a referral for neuro was requested in June but for some reason was not ordered. Referral is now in epic.

## 2019-10-24 NOTE — Telephone Encounter (Signed)
Left another message to return call

## 2019-10-29 DIAGNOSIS — Z79891 Long term (current) use of opiate analgesic: Secondary | ICD-10-CM | POA: Diagnosis not present

## 2019-10-29 DIAGNOSIS — G894 Chronic pain syndrome: Secondary | ICD-10-CM | POA: Diagnosis not present

## 2019-10-29 DIAGNOSIS — M5136 Other intervertebral disc degeneration, lumbar region: Secondary | ICD-10-CM | POA: Diagnosis not present

## 2019-10-29 NOTE — Telephone Encounter (Signed)
Patient notified and verbalized understanding. 

## 2019-10-30 ENCOUNTER — Other Ambulatory Visit: Payer: Self-pay | Admitting: Family Medicine

## 2019-11-02 NOTE — Telephone Encounter (Signed)
04/06/19 was last med check up

## 2019-11-02 NOTE — Telephone Encounter (Signed)
90-day with one refill needs follow-up visit this fall

## 2019-11-08 ENCOUNTER — Encounter: Payer: Self-pay | Admitting: Family Medicine

## 2019-11-19 DIAGNOSIS — M4300 Spondylolysis, site unspecified: Secondary | ICD-10-CM | POA: Diagnosis not present

## 2019-11-19 DIAGNOSIS — M4310 Spondylolisthesis, site unspecified: Secondary | ICD-10-CM | POA: Diagnosis not present

## 2019-11-19 DIAGNOSIS — M5416 Radiculopathy, lumbar region: Secondary | ICD-10-CM | POA: Diagnosis not present

## 2019-11-19 DIAGNOSIS — M47816 Spondylosis without myelopathy or radiculopathy, lumbar region: Secondary | ICD-10-CM | POA: Diagnosis not present

## 2019-12-03 DIAGNOSIS — M5136 Other intervertebral disc degeneration, lumbar region: Secondary | ICD-10-CM | POA: Diagnosis not present

## 2019-12-03 DIAGNOSIS — G894 Chronic pain syndrome: Secondary | ICD-10-CM | POA: Diagnosis not present

## 2019-12-03 DIAGNOSIS — Z79891 Long term (current) use of opiate analgesic: Secondary | ICD-10-CM | POA: Diagnosis not present

## 2019-12-05 DIAGNOSIS — R262 Difficulty in walking, not elsewhere classified: Secondary | ICD-10-CM | POA: Diagnosis not present

## 2019-12-05 DIAGNOSIS — M6281 Muscle weakness (generalized): Secondary | ICD-10-CM | POA: Diagnosis not present

## 2019-12-05 DIAGNOSIS — M47816 Spondylosis without myelopathy or radiculopathy, lumbar region: Secondary | ICD-10-CM | POA: Diagnosis not present

## 2019-12-05 DIAGNOSIS — M5441 Lumbago with sciatica, right side: Secondary | ICD-10-CM | POA: Diagnosis not present

## 2019-12-05 DIAGNOSIS — G8929 Other chronic pain: Secondary | ICD-10-CM | POA: Diagnosis not present

## 2019-12-05 DIAGNOSIS — M5442 Lumbago with sciatica, left side: Secondary | ICD-10-CM | POA: Diagnosis not present

## 2019-12-07 DIAGNOSIS — M47816 Spondylosis without myelopathy or radiculopathy, lumbar region: Secondary | ICD-10-CM | POA: Diagnosis not present

## 2019-12-18 DIAGNOSIS — M47816 Spondylosis without myelopathy or radiculopathy, lumbar region: Secondary | ICD-10-CM | POA: Diagnosis not present

## 2019-12-21 DIAGNOSIS — M47816 Spondylosis without myelopathy or radiculopathy, lumbar region: Secondary | ICD-10-CM | POA: Diagnosis not present

## 2019-12-27 ENCOUNTER — Other Ambulatory Visit: Payer: Self-pay | Admitting: Family Medicine

## 2019-12-27 DIAGNOSIS — M5440 Lumbago with sciatica, unspecified side: Secondary | ICD-10-CM | POA: Diagnosis not present

## 2019-12-27 DIAGNOSIS — M545 Low back pain, unspecified: Secondary | ICD-10-CM | POA: Diagnosis not present

## 2019-12-27 DIAGNOSIS — M5431 Sciatica, right side: Secondary | ICD-10-CM | POA: Diagnosis not present

## 2019-12-27 DIAGNOSIS — M5432 Sciatica, left side: Secondary | ICD-10-CM | POA: Diagnosis not present

## 2019-12-31 DIAGNOSIS — G894 Chronic pain syndrome: Secondary | ICD-10-CM | POA: Diagnosis not present

## 2019-12-31 DIAGNOSIS — Z79891 Long term (current) use of opiate analgesic: Secondary | ICD-10-CM | POA: Diagnosis not present

## 2019-12-31 DIAGNOSIS — M5136 Other intervertebral disc degeneration, lumbar region: Secondary | ICD-10-CM | POA: Diagnosis not present

## 2020-01-01 ENCOUNTER — Ambulatory Visit (INDEPENDENT_AMBULATORY_CARE_PROVIDER_SITE_OTHER): Payer: Medicaid Other | Admitting: Family Medicine

## 2020-01-01 ENCOUNTER — Encounter: Payer: Self-pay | Admitting: Family Medicine

## 2020-01-01 VITALS — BP 108/82 | HR 134 | Temp 98.1°F | Wt 163.0 lb

## 2020-01-01 DIAGNOSIS — K029 Dental caries, unspecified: Secondary | ICD-10-CM

## 2020-01-01 DIAGNOSIS — K047 Periapical abscess without sinus: Secondary | ICD-10-CM

## 2020-01-01 MED ORDER — CLINDAMYCIN HCL 300 MG PO CAPS: 300.0000 mg | ORAL_CAPSULE | Freq: Three times a day (TID) | ORAL | 0 refills | Status: DC

## 2020-01-01 NOTE — Patient Instructions (Signed)
Dental Pain Dental pain may be caused by many things, including:  Tooth decay (cavities or caries).  Infection.  The inner part of the tooth being filled with pus (abscess).  Injury. Sometimes the cause of pain is unknown. Your pain can vary. It may be mild or severe. You may have it all the time, or it may occur only when you are:  Chewing.  Exposed to hot or cold temperature.  Eating or drinking sugary foods or beverages, such as soda or candy. Follow these instructions at home: Medicines  Take over-the-counter and prescription medicines only as told by your doctor.  If you were prescribed an antibiotic medicine, take it as told by your doctor. Do not stop taking the medicine even if you start to feel better. Eating and drinking  Do not eat foods or drinks that cause you pain. These include: ? Very hot or very cold foods or drinks. ? Sweet or sugary foods or drinks. Managing pain and swelling   Gargle with a salt-water mixture 3-4 times a day. To make this, dissolve -1 tsp of salt in 1 cup of warm water.  If told, put ice on the painful area of your face: ? Put ice in a plastic bag. ? Place a towel between your skin and the bag. ? Leave the ice on for 20 minutes, 2-3 times a day. Brushing your teeth  Brush your teeth twice a day using a fluoride toothpaste.  Floss your teeth once a day.  Use a toothpaste made for sensitive teeth as told by your doctor.  Use a soft toothbrush. General instructions  Do not apply heat to the outside of your face.  Watch your dental pain. Let your doctor know if there are any changes.  Keep all follow-up visits as told by your doctor. This is important. Contact a doctor if:  Your pain is not relieved by medicines.  You have new symptoms.  Your symptoms get worse. Get help right away if:  You cannot open your mouth.  You are having trouble breathing or swallowing.  You have a fever.  Your face, neck, or jaw is  swollen. Summary  Dental pain may be caused by many things, including tooth decay, injury, or infection. In some cases, the cause is not known.  Your pain may be mild or severe. You may have pain all the time, or you may have it only when you eat or drink.  Take over-the-counter and prescription medicines only as told by your doctor.  Watch your dental pain for any changes. Let your doctor know if symptoms get worse. This information is not intended to replace advice given to you by your health care provider. Make sure you discuss any questions you have with your health care provider. Document Revised: 06/13/2018 Document Reviewed: 02/28/2017 Elsevier Patient Education  2020 [employer] Inc[employer]  

## 2020-01-01 NOTE — Progress Notes (Signed)
Patient ID: Kimberly Weiss, female    DOB: 04-19-1980, 39 y.o.   MRN: [id number]   Chief Complaint  Patient presents with  . abcess tooth    Patient reports abcess tooth since last week, really started bothering her yesterday. Is trying to save up for a root canal. Has been treated with Clindamycin for this in the past.    Subjective:    HPI cc- dental abscess. Concern of tooth abscess for past week and needing a root canal.  Pt stating went to urgent care a few months ago for this and given Clindamycin.  Pt stating she is "saving money for root canal."  So she hasn't seen dentist yet.   Using ibuprofen and aleve. Pain and radiation to the left eye, and left cheek swelling. Used clindamycin in past, 300mg . No fever.  Noticing some tenderness in left cheek and some swelling.   Medical History Kimberly Weiss has a past medical history of Anemia, Anxiety, Chronic lower back pain, Daily headache, Depression, GERD (gastroesophageal reflux disease), GI bleed, History of blood transfusion (04/2014), History of duodenal ulcer (04/2014), History of kidney stones, Hypertension, Hypothyroidism, Interstitial cystitis, Pneumonia (~ 2008), Renal stones, and Thyroid disease.   Outpatient Encounter Medications as of 01/01/2020  Medication Sig  . albuterol (VENTOLIN HFA) 108 (90 Base) MCG/ACT inhaler INHALE 2 PUFFS INTO THE LUNGS EVERY 4 (FOUR) HOURS AS NEEDED FOR WHEEZING OR SHORTNESS OF BREATH.  13/04/2019 amitriptyline (ELAVIL) 50 MG tablet TAKE 1 TABLET BY MOUTH EVERYDAY AT BEDTIME  . clindamycin (CLEOCIN) 300 MG capsule Take 1 capsule (300 mg total) by mouth 3 (three) times daily.  . DULoxetine (CYMBALTA) 60 MG capsule TAKE 1 CAPSULE BY MOUTH EVERY DAY  . fluticasone (FLONASE) 50 MCG/ACT nasal spray Place 1 spray into both nostrils daily.  . hydrochlorothiazide (HYDRODIURIL) 25 MG tablet TAKE 1/2 TO 1 TABLETS BY MOUTH EVERY MORNING AS NEEDED FOR SWELLING  . levothyroxine (SYNTHROID) 100 MCG tablet Take 1  tablet (100 mcg total) by mouth daily.  . Oxycodone HCl 10 MG TABS Take by mouth. Every 6 hours via pain management DrCrisp  . potassium chloride (K-DUR) 10 MEQ tablet Take 1 tablet (10 mEq total) by mouth daily as needed. Only take when taking HCTZ  . promethazine (PHENERGAN) 25 MG tablet TAKE 1 TABLET BY MOUTH EVERY 8 HOURS AS NEEDED (Patient taking differently: Take 25 mg by mouth every 8 (eight) hours as needed for nausea or vomiting. )  . [DISCONTINUED] cefdinir (OMNICEF) 300 MG capsule Take 1 capsule (300 mg total) by mouth 2 (two) times daily.  . [DISCONTINUED] clindamycin (CLEOCIN) 300 MG capsule Take 1 capsule (300 mg total) by mouth 3 (three) times daily.   No facility-administered encounter medications on file as of 01/01/2020.     Review of Systems  Constitutional: Negative for chills and fever.  HENT: Positive for dental problem, facial swelling and mouth sores. Negative for congestion, postnasal drip, rhinorrhea and sore throat.   Eyes: Negative for pain and redness.  Respiratory: Negative for cough, shortness of breath and wheezing.   Cardiovascular: Negative for chest pain and leg swelling.  Gastrointestinal: Negative for abdominal pain, diarrhea, nausea and vomiting.  Genitourinary: Negative for dysuria and frequency.  Musculoskeletal: Negative for arthralgias and back pain.  Skin: Negative for rash.  Neurological: Negative for dizziness, weakness and headaches.     Vitals BP 108/82   Pulse (!) 134   Temp 98.1 F (36.7 C)   Wt 163 lb (73.9 kg)  SpO2 98%   BMI 28.87 kg/m   Objective:   Physical Exam Vitals and nursing note reviewed.  Constitutional:      General: She is in acute distress (mild due to pain).     Appearance: Normal appearance. She is not ill-appearing.  HENT:     Head: Normocephalic and atraumatic.     Nose: Nose normal.     Mouth/Throat:     Mouth: Mucous membranes are moist.     Dentition: Gingival swelling, dental caries, dental  abscesses and gum lesions present.     Pharynx: Oropharynx is clear.      Comments: +abscess on gingiva above tooth #10.  +left cheek swelling, no erythema or warmth.  No fluctuance Eyes:     Extraocular Movements: Extraocular movements intact.     Conjunctiva/sclera: Conjunctivae normal.     Pupils: Pupils are equal, round, and reactive to light.  Cardiovascular:     Rate and Rhythm: Regular rhythm. Tachycardia present.     Pulses: Normal pulses.     Heart sounds: Normal heart sounds.  Pulmonary:     Effort: Pulmonary effort is normal.     Breath sounds: Normal breath sounds. No wheezing, rhonchi or rales.  Musculoskeletal:        General: Normal range of motion.     Right lower leg: No edema.     Left lower leg: No edema.  Skin:    General: Skin is warm and dry.     Findings: No lesion or rash.  Neurological:     General: No focal deficit present.     Mental Status: She is alert and oriented to person, place, and time.  Psychiatric:        Mood and Affect: Mood normal.        Behavior: Behavior normal.      Assessment and Plan   1. Dental abscess - clindamycin (CLEOCIN) 300 MG capsule; Take 1 capsule (300 mg total) by mouth 3 (three) times daily.  Dispense: 30 capsule; Refill: 0  2. Dental caries - clindamycin (CLEOCIN) 300 MG capsule; Take 1 capsule (300 mg total) by mouth 3 (three) times daily.  Dispense: 30 capsule; Refill: 0    Gave clindamycin since has had this in past and worked well for abscess. Recommended pt to call dentist soon to get appt before finsihing her medications, since this is a recurrent problem and needs the tooth extracted.   Cont ibuprofen/tylenol prn.  Get orajel to also help with pain.  Pt in agreement. Call or rto if worsening pain in face or swelling in cheek.   F/u prn.

## 2020-01-14 ENCOUNTER — Other Ambulatory Visit: Payer: Self-pay | Admitting: Family Medicine

## 2020-01-28 DIAGNOSIS — Z79891 Long term (current) use of opiate analgesic: Secondary | ICD-10-CM | POA: Diagnosis not present

## 2020-01-28 DIAGNOSIS — M5136 Other intervertebral disc degeneration, lumbar region: Secondary | ICD-10-CM | POA: Diagnosis not present

## 2020-01-28 DIAGNOSIS — G894 Chronic pain syndrome: Secondary | ICD-10-CM | POA: Diagnosis not present

## 2020-02-04 ENCOUNTER — Ambulatory Visit (INDEPENDENT_AMBULATORY_CARE_PROVIDER_SITE_OTHER): Payer: Medicaid Other | Admitting: Family Medicine

## 2020-02-04 ENCOUNTER — Other Ambulatory Visit: Payer: Self-pay

## 2020-02-04 ENCOUNTER — Ambulatory Visit (HOSPITAL_COMMUNITY)
Admission: RE | Admit: 2020-02-04 | Discharge: 2020-02-04 | Disposition: A | Discharge status: Home / Self Care | Payer: Medicaid Other | Source: Ambulatory Visit | Attending: Family Medicine | Admitting: Family Medicine

## 2020-02-04 VITALS — HR 102 | Temp 98.0°F | Ht 63.0 in | Wt 154.8 lb

## 2020-02-04 DIAGNOSIS — R1310 Dysphagia, unspecified: Secondary | ICD-10-CM | POA: Diagnosis not present

## 2020-02-04 DIAGNOSIS — T7491XA Unspecified adult maltreatment, confirmed, initial encounter: Secondary | ICD-10-CM

## 2020-02-04 DIAGNOSIS — M542 Cervicalgia: Secondary | ICD-10-CM | POA: Insufficient documentation

## 2020-02-04 DIAGNOSIS — R221 Localized swelling, mass and lump, neck: Secondary | ICD-10-CM | POA: Diagnosis not present

## 2020-02-04 DIAGNOSIS — W4909XA Other specified item causing external constriction, initial encounter: Secondary | ICD-10-CM | POA: Diagnosis not present

## 2020-02-04 MED ORDER — DICLOFENAC SODIUM 75 MG PO TBEC: 75.0000 mg | DELAYED_RELEASE_TABLET | Freq: Two times a day (BID) | ORAL | 0 refills | Status: DC

## 2020-02-04 MED ORDER — IBUPROFEN 800 MG PO TABS: 800.0000 mg | ORAL_TABLET | Freq: Three times a day (TID) | ORAL | 0 refills | Status: DC | PRN

## 2020-02-04 NOTE — Patient Instructions (Addendum)
Call the behavioral heath walkin clinic and the domestic violence crisis line for help with counseling.   Take the diclofenac for pain and ice the neck.  Call or return if worsening pain. Soft diet and continue to drink fluids.   Neck Contusion A neck contusion is a deep bruise in the neck. It is caused by a direct force to your neck. It is dangerous because there are many important structures in your neck. A neck bruise can cause swelling and bleeding in your neck, and that can make it hard for you to breathe. If this happens, it is a medical emergency. What are the causes?  Car accidents that cause: ? Direct force to your neck. ? Sudden twisting of your head (whiplash).  Sports injuries.  Bike injuries.  Being choked. What are the signs or symptoms? Symptoms of this condition include:  Pain.  Swelling.  Bruising or stiffness.  Blood that collects inside the neck (hematoma). Other symptoms depend on what parts of the neck are affected. If this condition affects a large blood vessel in the neck (carotid artery), it may cause:  A lump in the neck that keeps getting bigger.  Dizziness.  Decreasing consciousness.  Weakness on the side of the body that is not injured. If it affects the airway, it may cause:  Trouble breathing.  Noisy breathing (stridor).  A hoarse or weak voice.  Coughing up blood. If it affects the part of the body that moves food from your mouth to your stomach (esophagus), it may cause:  Trouble with swallowing.  Spitting up blood. How is this treated?  In most cases, it can be treated with home care. This includes: ? Rest. ? Ice. ? Over-the-counter pain medicine.  Not being able to breathe is the most dangerous problem. It is a medical emergency that requires treatment right away. Treatment may include having: ? A breathing tube put into your voice box (larynx). ? An emergency procedure to make a hole in your voice box or windpipe  (trachea) for a breathing tube. ? Surgery to fix any tears. ? Draining the bruise in your neck. Follow these instructions at home: Managing pain, stiffness, and swelling   If told, put ice on the injured area: ? Put ice in a plastic bag. ? Place a towel between your skin and the bag. ? Leave the ice on for 20 minutes, 2-3 times a day. General instructions   Take over-the-counter and prescription medicines only as told by your doctor.  Rest at home until you have less pain and swelling. Ask your doctor when you can return to your normal activities.  Keep your head and neck at least partly raised (elevated) above the level of your heart until you heal. Do this even when you sleep.  If you were given a soft cervical collar, wear it as told by your doctor. Do not wear the collar for longer than told by your doctor.  Follow instructions from your doctor about what you can or cannot eat.  Often, only fluids and soft foods are suggested until you heal.  Keep all follow-up visits as told by your doctor. This is important. Contact a doctor if:  Your pain does not get better in 2-3 days.  You have more pain or more trouble when you swallow. Get help right away if:  You have sudden trouble with breathing.  You have noisy breathing.  You cough up blood.  You cannot swallow.  You have: ? A drooping  face. ? Weakness on one side of your body. ? Trouble speaking. ? Trouble understanding what people say. These symptoms may be an emergency. Do not wait to see if the symptoms will go away. Get medical help right away. Call your local emergency services (911 in the [place] Do not drive yourself to the hospital. Summary  A neck contusion is a deep bruise in the neck. It is caused by a direct force to the neck.  This type of neck injury is dangerous because there are many important structures in your neck.  Take over-the-counter and prescription medicines only as told by your  doctor.  Rest and keep your head and neck at least partly raised above the level of your heart until you heal. Do this even when you sleep.  Get help right away if you have trouble breathing, cough up blood, cannot swallow, or have other new or worse symptoms. This information is not intended to replace advice given to you by your health care provider. Make sure you discuss any questions you have with your health care provider. Document Revised: 12/07/2018 Document Reviewed: 10/27/2017 Elsevier Patient Education  2020 [employer].
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
## 2020-02-05 ENCOUNTER — Encounter: Payer: Self-pay | Admitting: *Deleted

## 2020-02-09 NOTE — Telephone Encounter (Signed)
Nurses Please communicate with Kimberly Weiss The discomfort she is feeling should gradually improve over the next 7 to 10 days. Should she have ongoing troubles beyond that we may need to set her up with the ENT.  Certainly sorry that she is going through such an emotional issue. I would highly recommend counseling. Please give her information regarding the 24-hour behavioral health urgent care center in [place]. They can help see her and counsel her and get her hooked into counseling that would be affordable or potentially free  If any further troubles please let us know

## 2020-02-11 ENCOUNTER — Telehealth (INDEPENDENT_AMBULATORY_CARE_PROVIDER_SITE_OTHER): Payer: Medicaid Other | Admitting: Family Medicine

## 2020-02-11 ENCOUNTER — Other Ambulatory Visit: Payer: Self-pay

## 2020-02-11 DIAGNOSIS — M542 Cervicalgia: Secondary | ICD-10-CM | POA: Diagnosis not present

## 2020-02-11 DIAGNOSIS — W4909XD Other specified item causing external constriction, subsequent encounter: Secondary | ICD-10-CM

## 2020-02-11 DIAGNOSIS — T7491XD Unspecified adult maltreatment, confirmed, subsequent encounter: Secondary | ICD-10-CM

## 2020-02-11 MED ORDER — AMITRIPTYLINE HCL 50 MG PO TABS: ORAL_TABLET | ORAL | 1 refills | Status: DC

## 2020-02-11 MED ORDER — HYDROCHLOROTHIAZIDE 25 MG PO TABS: ORAL_TABLET | ORAL | 1 refills | Status: DC

## 2020-02-11 MED ORDER — POTASSIUM CHLORIDE ER 10 MEQ PO TBCR: 10.0000 meq | EXTENDED_RELEASE_TABLET | Freq: Every day | ORAL | 6 refills | Status: DC | PRN

## 2020-02-11 MED ORDER — LEVOTHYROXINE SODIUM 100 MCG PO TABS: 100.0000 ug | ORAL_TABLET | Freq: Every day | ORAL | 5 refills | Status: DC
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
## 2020-02-12 ENCOUNTER — Encounter: Payer: Self-pay | Admitting: Family Medicine

## 2020-02-14 ENCOUNTER — Encounter: Payer: Self-pay | Admitting: Family Medicine

## 2020-02-27 DIAGNOSIS — G894 Chronic pain syndrome: Secondary | ICD-10-CM | POA: Diagnosis not present

## 2020-02-27 DIAGNOSIS — M5136 Other intervertebral disc degeneration, lumbar region: Secondary | ICD-10-CM | POA: Diagnosis not present

## 2020-02-27 DIAGNOSIS — Z79891 Long term (current) use of opiate analgesic: Secondary | ICD-10-CM | POA: Diagnosis not present

## 2020-03-12 DIAGNOSIS — M47816 Spondylosis without myelopathy or radiculopathy, lumbar region: Secondary | ICD-10-CM | POA: Diagnosis not present

## 2020-04-01 DIAGNOSIS — Z79891 Long term (current) use of opiate analgesic: Secondary | ICD-10-CM | POA: Diagnosis not present

## 2020-04-01 DIAGNOSIS — M5136 Other intervertebral disc degeneration, lumbar region: Secondary | ICD-10-CM | POA: Diagnosis not present

## 2020-04-01 DIAGNOSIS — G894 Chronic pain syndrome: Secondary | ICD-10-CM | POA: Diagnosis not present

## 2020-04-03 ENCOUNTER — Encounter: Payer: Self-pay | Admitting: Family Medicine

## 2020-04-03 ENCOUNTER — Telehealth (INDEPENDENT_AMBULATORY_CARE_PROVIDER_SITE_OTHER): Payer: Medicaid Other | Admitting: Family Medicine

## 2020-04-03 ENCOUNTER — Other Ambulatory Visit: Payer: Self-pay

## 2020-04-03 DIAGNOSIS — B9689 Other specified bacterial agents as the cause of diseases classified elsewhere: Secondary | ICD-10-CM | POA: Insufficient documentation

## 2020-04-03 DIAGNOSIS — R110 Nausea: Secondary | ICD-10-CM | POA: Diagnosis not present

## 2020-04-03 DIAGNOSIS — J019 Acute sinusitis, unspecified: Secondary | ICD-10-CM

## 2020-04-03 MED ORDER — DOXYCYCLINE HYCLATE 100 MG PO TABS: 100.0000 mg | ORAL_TABLET | Freq: Two times a day (BID) | ORAL | 0 refills | Status: DC

## 2020-04-03 MED ORDER — PROMETHAZINE HCL 25 MG PO TABS: 25.0000 mg | ORAL_TABLET | Freq: Three times a day (TID) | ORAL | 1 refills | Status: DC | PRN

## 2020-04-03 NOTE — Progress Notes (Signed)
Patient ID: Kimberly Weiss, female    DOB: [birth year], 40 y.o.   MRN: [id number]   Chief Complaint  Patient presents with  . Headache   Subjective:  CC: sinus pain and pressure  This is a new problem.  Presents today via telephone visit with a complaint of sinus pressure, headache, postnasal drip, nausea from the postnasal drip, and swollen lymph nodes.  Symptoms have been present for 3 days.  Has not had a Covid test.  Does have  current tooth abscess, that needs root canal.  She is unable to find a dentist that will take Medicaid.  This tooth is causing her serious pain.  Feels this is what is causing her sinus infection today.  She has had a low-grade fever, denies chest pain, shortness of breath.   Patient presents today with respiratory illness Number of days present-3 days   Symptoms include- sinus pressure, headache, post nasal drip causing her to have nausea, swollen lymph nodes. Started taking left over clindamycin for the past one and a half days.   Presence of worrisome signs (severe shortness of breath, lethargy, etc.) - none  Recent/current visit to urgent care or ER- none  Recent direct exposure to Covid- yes - father has covid and they live together   Any current Covid testing- none  Requesting refill on flonase and phenergan  Virtual Visit via Telephone Note  I connected with Kimberly Weiss on 04/03/20 at  9:00 AM EST by telephone and verified that I am speaking with the correct person using two identifiers.  Location: Patient: home Provider: office   I discussed the limitations, risks, security and privacy concerns of performing an evaluation and management service by telephone and the availability of in person appointments. I also discussed with the patient that there may be a patient responsible charge related to this service. The patient expressed understanding and agreed to proceed.   History of Present Illness:     Observations/Objective:   Assessment and Plan:   Follow Up Instructions:    I discussed the assessment and treatment plan with the patient. The patient was provided an opportunity to ask questions and all were answered. The patient agreed with the plan and demonstrated an understanding of the instructions.   The patient was advised to call back or seek an in-person evaluation if the symptoms worsen or if the condition fails to improve as anticipated.  I provided 12 minutes of non-face-to-face time during this encounter.     Medical History Kimberly Weiss has a past medical history of Anemia, Anxiety, Chronic lower back pain, Daily headache, Depression, GERD (gastroesophageal reflux disease), GI bleed, History of blood transfusion (04/2014), History of duodenal ulcer (04/2014), History of kidney stones, Hypertension, Hypothyroidism, Interstitial cystitis, Pneumonia (~ 2008), Renal stones, and Thyroid disease.   Outpatient Encounter Medications as of 04/03/2020  Medication Sig  . albuterol (VENTOLIN HFA) 108 (90 Base) MCG/ACT inhaler INHALE 2 PUFFS INTO THE LUNGS EVERY 4 (FOUR) HOURS AS NEEDED FOR WHEEZING OR SHORTNESS OF BREATH.  Marland Kitchen amitriptyline (ELAVIL) 50 MG tablet TAKE 1 TABLET BY MOUTH EVERYDAY AT BEDTIME  . doxycycline (VIBRA-TABS) 100 MG tablet Take 1 tablet (100 mg total) by mouth 2 (two) times daily.  . DULoxetine (CYMBALTA) 60 MG capsule TAKE 1 CAPSULE BY MOUTH EVERY DAY  . fluticasone (FLONASE) 50 MCG/ACT nasal spray Place 1 spray into both nostrils daily.  . hydrochlorothiazide (HYDRODIURIL) 25 MG tablet 1 qam as needed swelling in the legs  . levothyroxine (  SYNTHROID) 100 MCG tablet Take 1 tablet (100 mcg total) by mouth daily.  . Oxycodone HCl 10 MG TABS Take by mouth. Every 6 hours via pain management DrCrisp  . potassium chloride (KLOR-CON) 10 MEQ tablet Take 1 tablet (10 mEq total) by mouth daily as needed. Only take when taking HCTZ  . [DISCONTINUED] promethazine (PHENERGAN)  25 MG tablet TAKE 1 TABLET BY MOUTH EVERY 8 HOURS AS NEEDED (Patient taking differently: Take 25 mg by mouth every 8 (eight) hours as needed for nausea or vomiting.)  . ibuprofen (ADVIL) 800 MG tablet Take 1 tablet (800 mg total) by mouth every 8 (eight) hours as needed. (Patient not taking: Reported on 04/03/2020)  . promethazine (PHENERGAN) 25 MG tablet Take 1 tablet (25 mg total) by mouth every 8 (eight) hours as needed.   No facility-administered encounter medications on file as of 04/03/2020.     Review of Systems  Constitutional: Positive for fever. Negative for chills.       99.4-99.6  HENT: Positive for postnasal drip, sinus pressure and sinus pain. Negative for congestion and sore throat.   Respiratory: Positive for cough. Negative for shortness of breath.   Cardiovascular: Negative for chest pain.  Gastrointestinal: Positive for nausea. Negative for abdominal pain, diarrhea and vomiting.  Genitourinary: Negative for dysuria.  Musculoskeletal: Negative for myalgias.  Neurological: Positive for headaches. Negative for dizziness and light-headedness.     Vitals There were no vitals taken for this visit. unable Objective:   Physical Exam  Unable: no shortness of breath. Reports pain and pressure between eyes.  Assessment and Plan   1. Acute bacterial rhinosinusitis - doxycycline (VIBRA-TABS) 100 MG tablet; Take 1 tablet (100 mg total) by mouth 2 (two) times daily.  Dispense: 20 tablet; Refill: 0  2. Nausea - promethazine (PHENERGAN) 25 MG tablet; Take 1 tablet (25 mg total) by mouth every 8 (eight) hours as needed.  Dispense: 30 tablet; Refill: 1   Will treat sinus infection, based on reported symptoms.  Encouraged her to not continue to take clindamycin, that was given to her for her dental abscess.  Will prescribe Doxy for 10-day course.  She is encouraged to complete this course.  I sent message to Dr. Lilyan Punt, asking if he is aware of any dentist who accept Medicaid  for her root canal, he suggest that she contact the health department for the this information.  Message sent to patient via MyChart.  Nausea from post nasal drip, had phenergan on med list, refill sent.   Agrees with plan of care discussed today. Understands warning signs to seek further care: chest pain, shortness of breath, any significant change in health.  Understands to follow-up if symptoms do not improve, or worsen.  Understands to complete full course of antibiotics for the sinus infection, encouraged root canal, even if she needs to go on a payment plan.   Dorena Bodo, NP 04/03/2020

## 2020-04-11 ENCOUNTER — Ambulatory Visit (INDEPENDENT_AMBULATORY_CARE_PROVIDER_SITE_OTHER): Payer: Medicaid Other | Admitting: Family Medicine

## 2020-04-11 ENCOUNTER — Other Ambulatory Visit: Payer: Self-pay

## 2020-04-11 DIAGNOSIS — J019 Acute sinusitis, unspecified: Secondary | ICD-10-CM

## 2020-04-11 MED ORDER — AMOXICILLIN-POT CLAVULANATE 875-125 MG PO TABS: 1.0000 | ORAL_TABLET | Freq: Two times a day (BID) | ORAL | 0 refills | Status: DC

## 2020-04-11 MED ORDER — FLUCONAZOLE 150 MG PO TABS: ORAL_TABLET | ORAL | 1 refills | Status: DC

## 2020-04-11 NOTE — Progress Notes (Signed)
   Subjective:    Patient ID: Kimberly Weiss, female    DOB: 11-Mar-1980, 40 y.o.   MRN: [id number]  Sinus Problem This is a new problem. The current episode started 1 to 4 weeks ago. Associated symptoms include headaches and sinus pressure.   Finished doxycycline and not better patient with a lot of sinus pressure pain discomfort denies high fever chills sweats denies wheezing difficulty breathing states her energy level overall fair She does have full port intention and she is supposed to have teeth pulled somewhere in the near future when she finds a doctor who takes Medicaid for dentistry  Review of Systems  HENT: Positive for sinus pressure.   Neurological: Positive for headaches.       Objective:   Physical Exam  Port intention neck no masses lungs clear heart regular sinus mild tenderness      Assessment & Plan:  Sinusitis Augmentin for the next 7 to 14 days Diflucan for yeast infection follow-up if progressive troubles find a dentist who accepts Medicaid and get teeth pulled follow-up sooner if any problems

## 2020-04-28 DIAGNOSIS — M5136 Other intervertebral disc degeneration, lumbar region: Secondary | ICD-10-CM | POA: Diagnosis not present

## 2020-04-28 DIAGNOSIS — G894 Chronic pain syndrome: Secondary | ICD-10-CM | POA: Diagnosis not present

## 2020-04-28 DIAGNOSIS — Z79891 Long term (current) use of opiate analgesic: Secondary | ICD-10-CM | POA: Diagnosis not present

## 2020-04-29 ENCOUNTER — Other Ambulatory Visit: Payer: Self-pay | Admitting: Family Medicine

## 2020-04-29 NOTE — Telephone Encounter (Signed)
This seems like the only strength?  Please verify with patient what strength is she currently taking thank you

## 2020-05-26 DIAGNOSIS — Z79891 Long term (current) use of opiate analgesic: Secondary | ICD-10-CM | POA: Diagnosis not present

## 2020-05-26 DIAGNOSIS — M5136 Other intervertebral disc degeneration, lumbar region: Secondary | ICD-10-CM | POA: Diagnosis not present

## 2020-05-26 DIAGNOSIS — G894 Chronic pain syndrome: Secondary | ICD-10-CM | POA: Diagnosis not present

## 2020-06-23 DIAGNOSIS — Z79891 Long term (current) use of opiate analgesic: Secondary | ICD-10-CM | POA: Diagnosis not present

## 2020-06-23 DIAGNOSIS — M5136 Other intervertebral disc degeneration, lumbar region: Secondary | ICD-10-CM | POA: Diagnosis not present

## 2020-06-23 DIAGNOSIS — G894 Chronic pain syndrome: Secondary | ICD-10-CM | POA: Diagnosis not present

## 2020-07-10 ENCOUNTER — Other Ambulatory Visit: Payer: Self-pay | Admitting: Family Medicine

## 2020-07-11 NOTE — Telephone Encounter (Signed)
Last med check up feb 2021

## 2020-07-29 ENCOUNTER — Other Ambulatory Visit: Payer: Self-pay | Admitting: Family Medicine

## 2020-07-29 DIAGNOSIS — Z79891 Long term (current) use of opiate analgesic: Secondary | ICD-10-CM | POA: Diagnosis not present

## 2020-07-29 DIAGNOSIS — M5136 Other intervertebral disc degeneration, lumbar region: Secondary | ICD-10-CM | POA: Diagnosis not present

## 2020-07-29 DIAGNOSIS — G894 Chronic pain syndrome: Secondary | ICD-10-CM | POA: Diagnosis not present

## 2020-07-29 NOTE — Telephone Encounter (Signed)
May have 90-day needs follow-up by later in the summer

## 2020-08-25 DIAGNOSIS — M48062 Spinal stenosis, lumbar region with neurogenic claudication: Secondary | ICD-10-CM | POA: Diagnosis not present

## 2020-08-25 DIAGNOSIS — G894 Chronic pain syndrome: Secondary | ICD-10-CM | POA: Diagnosis not present

## 2020-08-25 DIAGNOSIS — M5136 Other intervertebral disc degeneration, lumbar region: Secondary | ICD-10-CM | POA: Diagnosis not present

## 2020-09-05 DIAGNOSIS — M47816 Spondylosis without myelopathy or radiculopathy, lumbar region: Secondary | ICD-10-CM | POA: Diagnosis not present

## 2020-09-22 DIAGNOSIS — M5136 Other intervertebral disc degeneration, lumbar region: Secondary | ICD-10-CM | POA: Diagnosis not present

## 2020-09-22 DIAGNOSIS — Z79891 Long term (current) use of opiate analgesic: Secondary | ICD-10-CM | POA: Diagnosis not present

## 2020-09-22 DIAGNOSIS — G894 Chronic pain syndrome: Secondary | ICD-10-CM | POA: Diagnosis not present

## 2020-10-20 DIAGNOSIS — M5136 Other intervertebral disc degeneration, lumbar region: Secondary | ICD-10-CM | POA: Diagnosis not present

## 2020-10-20 DIAGNOSIS — Z79891 Long term (current) use of opiate analgesic: Secondary | ICD-10-CM | POA: Diagnosis not present

## 2020-10-20 DIAGNOSIS — G894 Chronic pain syndrome: Secondary | ICD-10-CM | POA: Diagnosis not present

## 2020-11-11 ENCOUNTER — Telehealth: Payer: Self-pay | Admitting: Family Medicine

## 2020-11-11 NOTE — Telephone Encounter (Signed)
Sent my chart message to schedule appointment.

## 2020-11-11 NOTE — Telephone Encounter (Signed)
Please contact patient to have her schedule follow  up. Thank you! 

## 2020-11-12 NOTE — Telephone Encounter (Signed)
May have 90-day on each 

## 2020-11-12 NOTE — Telephone Encounter (Signed)
Patient called and made an appt she is completely out of her amitriptyline 50mg .  She would like to know if she can get that one refilled.

## 2020-11-14 ENCOUNTER — Telehealth: Payer: Self-pay | Admitting: Family Medicine

## 2020-11-14 NOTE — Telephone Encounter (Signed)
Medication was sent in by Dr Lorin Picket this morning. Patient notified and will check with pharmacy.

## 2020-11-14 NOTE — Telephone Encounter (Signed)
Patient has appt with Dr. Lorin Picket Monday 11/17/2020 3:30 but is out of amitriptyline and needs a few to get her through the weekend.  CB# [phone]

## 2020-11-17 ENCOUNTER — Encounter: Payer: Self-pay | Admitting: Family Medicine

## 2020-11-17 ENCOUNTER — Other Ambulatory Visit: Payer: Self-pay

## 2020-11-17 ENCOUNTER — Ambulatory Visit: Payer: Medicaid Other | Admitting: Family Medicine

## 2020-11-17 VITALS — BP 126/89 | HR 120 | Temp 98.1°F | Wt 168.4 lb

## 2020-11-17 DIAGNOSIS — Z79899 Other long term (current) drug therapy: Secondary | ICD-10-CM | POA: Diagnosis not present

## 2020-11-17 DIAGNOSIS — Z79891 Long term (current) use of opiate analgesic: Secondary | ICD-10-CM | POA: Diagnosis not present

## 2020-11-17 DIAGNOSIS — M48062 Spinal stenosis, lumbar region with neurogenic claudication: Secondary | ICD-10-CM | POA: Diagnosis not present

## 2020-11-17 DIAGNOSIS — E038 Other specified hypothyroidism: Secondary | ICD-10-CM | POA: Diagnosis not present

## 2020-11-17 DIAGNOSIS — J019 Acute sinusitis, unspecified: Secondary | ICD-10-CM | POA: Diagnosis not present

## 2020-11-17 DIAGNOSIS — R110 Nausea: Secondary | ICD-10-CM

## 2020-11-17 DIAGNOSIS — R0981 Nasal congestion: Secondary | ICD-10-CM | POA: Diagnosis not present

## 2020-11-17 DIAGNOSIS — M5136 Other intervertebral disc degeneration, lumbar region: Secondary | ICD-10-CM | POA: Diagnosis not present

## 2020-11-17 DIAGNOSIS — G894 Chronic pain syndrome: Secondary | ICD-10-CM | POA: Diagnosis not present

## 2020-11-17 MED ORDER — AMOXICILLIN-POT CLAVULANATE 875-125 MG PO TABS: 1.0000 | ORAL_TABLET | Freq: Two times a day (BID) | ORAL | 0 refills | Status: DC

## 2020-11-17 MED ORDER — DULOXETINE HCL 60 MG PO CPEP: ORAL_CAPSULE | ORAL | 1 refills | Status: DC

## 2020-11-17 MED ORDER — AZELASTINE HCL 0.1 % NA SOLN: 2.0000 | Freq: Two times a day (BID) | NASAL | 12 refills | Status: AC

## 2020-11-17 MED ORDER — PROMETHAZINE HCL 25 MG PO TABS: 25.0000 mg | ORAL_TABLET | Freq: Three times a day (TID) | ORAL | 1 refills | Status: DC | PRN

## 2020-11-17 MED ORDER — FLUCONAZOLE 150 MG PO TABS: ORAL_TABLET | ORAL | 1 refills | Status: DC
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
## 2020-11-18 ENCOUNTER — Encounter: Payer: Self-pay | Admitting: Family Medicine

## 2020-11-18 DIAGNOSIS — J329 Chronic sinusitis, unspecified: Secondary | ICD-10-CM

## 2020-11-18 DIAGNOSIS — F4310 Post-traumatic stress disorder, unspecified: Secondary | ICD-10-CM

## 2020-11-18 DIAGNOSIS — F439 Reaction to severe stress, unspecified: Secondary | ICD-10-CM

## 2020-11-18 LAB — SARS-COV-2, NAA 2 DAY TAT

## 2020-11-18 LAB — NOVEL CORONAVIRUS, NAA: SARS-CoV-2, NAA: NOT DETECTED

## 2020-11-19 ENCOUNTER — Other Ambulatory Visit: Payer: Self-pay

## 2020-11-19 DIAGNOSIS — E038 Other specified hypothyroidism: Secondary | ICD-10-CM

## 2020-11-19 DIAGNOSIS — F419 Anxiety disorder, unspecified: Secondary | ICD-10-CM

## 2020-11-19 DIAGNOSIS — F32A Anxiety disorder, unspecified: Secondary | ICD-10-CM

## 2020-11-19 MED ORDER — DULOXETINE HCL 60 MG PO CPEP: ORAL_CAPSULE | ORAL | 0 refills | Status: DC

## 2020-11-19 MED ORDER — LEVOTHYROXINE SODIUM 100 MCG PO TABS: 100.0000 ug | ORAL_TABLET | Freq: Every day | ORAL | 0 refills | Status: DC

## 2020-11-19 MED ORDER — AMITRIPTYLINE HCL 50 MG PO TABS: ORAL_TABLET | ORAL | 0 refills | Status: DC

## 2020-11-19 NOTE — Telephone Encounter (Signed)
Had appointment 9/20

## 2020-12-15 DIAGNOSIS — M48062 Spinal stenosis, lumbar region with neurogenic claudication: Secondary | ICD-10-CM | POA: Diagnosis not present

## 2020-12-15 DIAGNOSIS — Z79891 Long term (current) use of opiate analgesic: Secondary | ICD-10-CM | POA: Diagnosis not present

## 2020-12-15 DIAGNOSIS — G894 Chronic pain syndrome: Secondary | ICD-10-CM | POA: Diagnosis not present

## 2020-12-15 DIAGNOSIS — M5136 Other intervertebral disc degeneration, lumbar region: Secondary | ICD-10-CM | POA: Diagnosis not present

## 2020-12-15 NOTE — Telephone Encounter (Signed)
Nurses Please go ahead with referral for counseling for stress and PTSD symptoms Please have Toni Amend notify patient of the various options Thanks-Dr. Lorin Picket

## 2020-12-15 NOTE — Addendum Note (Signed)
Addended by: Marlowe Shores on: 12/15/2020 02:05 PM   Modules accepted: Orders

## 2020-12-16 NOTE — Addendum Note (Signed)
Addended by: Marlowe Shores on: 12/16/2020 09:39 AM   Modules accepted: Orders

## 2020-12-16 NOTE — Telephone Encounter (Signed)
Nurses-I would recommend a consultation with ENT Dr.Teoh or [facility]-forward to [place] for referral due to frequent sinus infections.  The ENT can evaluate for bone spurs etc.  Thank you

## 2020-12-23 ENCOUNTER — Encounter: Payer: Self-pay | Admitting: Family Medicine

## 2020-12-23 MED ORDER — CLINDAMYCIN HCL 300 MG PO CAPS: ORAL_CAPSULE | ORAL | 0 refills | Status: DC

## 2020-12-23 NOTE — Telephone Encounter (Signed)
Nurses  I would recommend clindamycin 300 mg 1 taken 3 times daily for the next 10 days Warm compresses 20 minutes at a time every 2 hours while awake  If dramatically better after 7 days may stop the medicine Also should do follow-up visit with dental services  If ongoing troubles or worsening issues to follow-up as well here or urgent care or ER  Also lab orders are already in the system for blood work patient can do this at very convenience through [employer]

## 2021-01-12 DIAGNOSIS — G894 Chronic pain syndrome: Secondary | ICD-10-CM | POA: Diagnosis not present

## 2021-01-12 DIAGNOSIS — M5136 Other intervertebral disc degeneration, lumbar region: Secondary | ICD-10-CM | POA: Diagnosis not present

## 2021-01-12 DIAGNOSIS — Z79891 Long term (current) use of opiate analgesic: Secondary | ICD-10-CM | POA: Diagnosis not present

## 2021-02-09 DIAGNOSIS — M5136 Other intervertebral disc degeneration, lumbar region: Secondary | ICD-10-CM | POA: Diagnosis not present

## 2021-02-09 DIAGNOSIS — G894 Chronic pain syndrome: Secondary | ICD-10-CM | POA: Diagnosis not present

## 2021-02-09 DIAGNOSIS — Z79891 Long term (current) use of opiate analgesic: Secondary | ICD-10-CM | POA: Diagnosis not present

## 2021-03-11 ENCOUNTER — Other Ambulatory Visit: Payer: Self-pay | Admitting: Family Medicine

## 2021-03-11 DIAGNOSIS — R110 Nausea: Secondary | ICD-10-CM

## 2021-03-12 DIAGNOSIS — F331 Major depressive disorder, recurrent, moderate: Secondary | ICD-10-CM | POA: Diagnosis not present

## 2021-03-12 DIAGNOSIS — F4312 Post-traumatic stress disorder, chronic: Secondary | ICD-10-CM | POA: Diagnosis not present

## 2021-03-12 DIAGNOSIS — F411 Generalized anxiety disorder: Secondary | ICD-10-CM | POA: Diagnosis not present

## 2021-03-17 ENCOUNTER — Encounter (HOSPITAL_COMMUNITY): Payer: Self-pay

## 2021-03-17 ENCOUNTER — Ambulatory Visit (HOSPITAL_COMMUNITY)
Admission: EM | Admit: 2021-03-17 | Discharge: 2021-03-17 | Disposition: A | Discharge status: Home / Self Care | Payer: Medicaid Other | Attending: Internal Medicine | Admitting: Internal Medicine

## 2021-03-17 ENCOUNTER — Other Ambulatory Visit: Payer: Self-pay

## 2021-03-17 DIAGNOSIS — B9689 Other specified bacterial agents as the cause of diseases classified elsewhere: Secondary | ICD-10-CM | POA: Insufficient documentation

## 2021-03-17 DIAGNOSIS — J019 Acute sinusitis, unspecified: Secondary | ICD-10-CM | POA: Diagnosis not present

## 2021-03-17 LAB — POCT RAPID STREP A, ED / UC: Streptococcus, Group A Screen (Direct): NEGATIVE

## 2021-03-17 MED ORDER — AMOXICILLIN-POT CLAVULANATE 875-125 MG PO TABS: 1.0000 | ORAL_TABLET | Freq: Two times a day (BID) | ORAL | 0 refills | Status: AC

## 2021-03-17 NOTE — Discharge Instructions (Addendum)
The Augmentin  twice daily with food for 7 days for treatment of your sinusitis.  Strep test is negative, and has been sent to lab to see if it grows bacteria, if this occurs you will be notified  Perform sinus irrigation 2-3 times a day with a [employer] sinus rinse kit and distilled water.  Do not use tap water.  You can use plain over-the-counter Mucinex every 6 hours to break up the stickiness of the mucus so your body can clear it.  Increase your oral fluid intake to thin out your mucus so that is also able for your body to clear more easily.  Use the nasal spray,as needed for nasal and sinus congestion.  You may reach out to the ear nose and throat specialist for reevaluation of your sinus cavities  If you develop any new or worsening symptoms return for reevaluation or see your primary care provider.

## 2021-03-17 NOTE — ED Triage Notes (Signed)
Pt presents with rt otalgia and sore throat x2 weeks

## 2021-03-17 NOTE — ED Provider Notes (Signed)
[place]    CSN: [id number] Arrival date & time: 03/17/21  1236      History   Chief Complaint Chief Complaint  Patient presents with   Otalgia    rt    HPI AUZHANE SLUTSKY is a 41 y.o. female.   Patient presents with bilateral ear pain, nasal congestion and sore throat for 2 weeks.  Right-sided ear pain is constant while left side is occurring intermittently.  Sore throat occurring intermittently as well.  Painful to swallow, unable to tolerate foods but tolerating liquids.  Endorses associated subjective fevers.  No known sick contacts.  Has attempted use of over-the-counter nasal spray, Aleve, Excedrin which has not been helpful.  Patient endorses history of frequent sinus infections and has required surgical intervention to the sinus cavity greater than 10 years ago.  PCP has placed ENT referral patient is awaiting.  History of a tonsillectomy.  Past Medical History:  Diagnosis Date   Anemia    Anxiety    Chronic lower back pain    Daily headache    "related to stress and sinus" (02/20/2015)   Depression    GERD (gastroesophageal reflux disease)    GI bleed    History of blood transfusion 04/2014   "related to duodenal ulcer"   History of duodenal ulcer 04/2014   "perforated"   History of kidney stones    Hypertension    Hypothyroidism    Interstitial cystitis    Pneumonia ~ 2008   Renal stones    Thyroid disease     Patient Active Problem List   Diagnosis Date Noted   Acute bacterial rhinosinusitis 04/03/2020   Nausea 04/03/2020   Bilateral sciatica 06/16/2015   S/P laparoscopic hernia repair 02/20/2015   Hypocalcemia 06/03/2014   Diarrhea 05/16/2014   Chronic narcotic use 05/16/2014   Duodenal ulcer with hemorrhage    Hemorrhagic shock ([place])    Gastrointestinal hemorrhage associated with duodenal ulcer    GI bleed    Other specified hypotension    Duodenal ulcer    Hypovolemic shock ([place])    Tobacco abuse    Other specified  hypothyroidism    Anxiety state    Head trauma    Tachycardia    UGIB (upper gastrointestinal bleed) 04/27/2014   Acute blood loss anemia    Anxiety and depression 11/03/2012   Dysfunctional uterine bleeding 06/10/2012   Interstitial cystitis 05/22/2012   Hypothyroidism 05/22/2012    Past Surgical History:  Procedure Laterality Date   BLADDER INSTILLATION  2004   CESAREAN SECTION  05/13/2004   ESOPHAGOGASTRODUODENOSCOPY N/A 04/27/2014   Procedure: ESOPHAGOGASTRODUODENOSCOPY (EGD);  Surgeon: Lear Ng, MD;  Location: [facility] ENDOSCOPY;  Service: Endoscopy;  Laterality: N/A;   ESOPHAGOGASTRODUODENOSCOPY (EGD) WITH PROPOFOL N/A 05/03/2014   Procedure: ESOPHAGOGASTRODUODENOSCOPY (EGD) WITH PROPOFOL;  Surgeon: Lear Ng, MD;  Location: [place];  Service: Endoscopy;  Laterality: N/A;   HERNIA REPAIR     INCISIONAL HERNIA REPAIR N/A 02/20/2015   Procedure: LAPAROSCOPIC INCISIONAL HERNIA;  Surgeon: Georganna Skeans, MD;  Location: [place];  Service: General;  Laterality: N/A;   INSERTION OF MESH N/A 02/20/2015   Procedure: INSERTION OF MESH;  Surgeon: Georganna Skeans, MD;  Location: [place];  Service: General;  Laterality: N/A;   LAPAROSCOPIC INCISIONAL / UMBILICAL / [place]  02/20/2015   IHR w/mesh & LOA   LAPAROTOMY N/A 05/07/2014   Procedure: EXPLORATORY LAPAROTOMY;  Surgeon: Georganna Skeans, MD;  Location: [place];  Service: General;  Laterality: N/A;   LITHOTRIPSY  2004   NASAL SINUS SURGERY  05/17/2008   "removed bone spurs in my sinus cavity"   REPAIR OF PERFORATED ULCER N/A 05/07/2014   Procedure: OVERSOWE DUODENAL ULCER;  Surgeon: Georganna Skeans, MD;  Location: [place];  Service: General;  Laterality: N/A;   TONSILLECTOMY  05/17/2008    OB History   No obstetric history on file.      Home Medications    Prior to Admission medications   Medication Sig Start Date End Date Taking? Authorizing Provider  albuterol (VENTOLIN HFA) 108 (90 Base) MCG/ACT inhaler  INHALE 2 PUFFS INTO THE LUNGS EVERY 4 HOURS AS NEEDED FOR WHEEZING/SHORTNESS OF BREATH 07/11/20   Kathyrn Drown, MD  amitriptyline (ELAVIL) 50 MG tablet TAKE 1 TABLET BY MOUTH EVERYDAY AT BEDTIME 11/19/20   Kathyrn Drown, MD  amoxicillin-clavulanate (AUGMENTIN) 875-125 MG tablet Take 1 tablet by mouth 2 (two) times daily. Patient not taking: Reported on 03/17/2021 11/17/20   Kathyrn Drown, MD  azelastine (ASTELIN) 0.1 % nasal spray Place 2 sprays into both nostrils 2 (two) times daily. 11/17/20   Kathyrn Drown, MD  clindamycin (CLEOCIN) 300 MG capsule Take one capsule po three times a day for next 10 days Patient not taking: Reported on 03/17/2021 12/23/20   Kathyrn Drown, MD  DULoxetine (CYMBALTA) 60 MG capsule TAKE 1 CAPSULE BY MOUTH EVERY DAY 11/19/20   Kathyrn Drown, MD  fluconazole (DIFLUCAN) 150 MG tablet 1 now and repeat in 7 days Patient not taking: Reported on 03/17/2021 11/17/20   Kathyrn Drown, MD  hydrochlorothiazide (HYDRODIURIL) 25 MG tablet 1 qam as needed swelling in the legs 02/11/20   Kathyrn Drown, MD  levothyroxine (SYNTHROID) 100 MCG tablet Take 1 tablet (100 mcg total) by mouth daily. 11/19/20   Kathyrn Drown, MD  Oxycodone HCl 10 MG TABS Take by mouth. Every 6 hours via pain management DrCrisp    [provider]  promethazine (PHENERGAN) 25 MG tablet TAKE 1 TABLET BY MOUTH EVERY 8 HOURS AS NEEDED. 03/12/21   Kathyrn Drown, MD    Family History Family History  Problem Relation Age of Onset   Heart Problems Mother    Heart Problems Father     Social History Social History   Tobacco Use   Smoking status: Every Day    Packs/day: 1.50    Years: 20.00    Pack years: 30.00    Types: Cigarettes   Smokeless tobacco: Never  Substance Use Topics   Alcohol use: No   Drug use: No     Allergies   Gabapentin, Zofran [ondansetron hcl], and Opana [oxymorphone hcl]   Review of Systems Review of Systems  Constitutional: Negative.   HENT:  Positive  for ear pain, sinus pressure, sinus pain and sore throat. Negative for congestion, dental problem, drooling, ear discharge, facial swelling, hearing loss, mouth sores, nosebleeds, postnasal drip, rhinorrhea, sneezing, tinnitus, trouble swallowing and voice change.   Respiratory: Negative.    Cardiovascular: Negative.   Musculoskeletal:  Positive for neck pain. Negative for arthralgias, back pain, gait problem, joint swelling, myalgias and neck stiffness.  Skin: Negative.   Neurological:  Positive for headaches. Negative for dizziness, tremors, seizures, syncope, facial asymmetry, speech difficulty, weakness, light-headedness and numbness.    Physical Exam Triage Vital Signs ED Triage Vitals  Enc Vitals Group     BP 03/17/21 1254 (!) 139/91     Pulse Rate 03/17/21 1254 Marland Kitchen)  115     Resp 03/17/21 1254 14     Temp 03/17/21 1254 98.9 F (37.2 C)     Temp Source 03/17/21 1254 Oral     SpO2 03/17/21 1254 98 %     Weight --      Height --      Head Circumference --      Peak Flow --      Pain Score 03/17/21 1255 8     Pain Loc --      Pain Edu? --      Excl. in [place]? --    No data found.  Updated Vital Signs BP (!) 139/91 (BP Location: Left Arm)    Pulse (!) 115    Temp 98.9 F (37.2 C) (Oral)    Resp 14    SpO2 98%   Visual Acuity Right Eye Distance:   Left Eye Distance:   Bilateral Distance:    Right Eye Near:   Left Eye Near:    Bilateral Near:     Physical Exam Constitutional:      Appearance: Normal appearance.  HENT:     Head: Normocephalic.     Right Ear: Tympanic membrane, ear canal and external ear normal.     Left Ear: Tympanic membrane, ear canal and external ear normal.     Nose: Congestion present. No rhinorrhea.     Right Turbinates: Swollen.     Left Turbinates: Swollen.     Right Sinus: Maxillary sinus tenderness and frontal sinus tenderness present.     Left Sinus: Maxillary sinus tenderness and frontal sinus tenderness present.     Mouth/Throat:      Mouth: Mucous membranes are moist.     Pharynx: Oropharynx is clear.  Eyes:     Extraocular Movements: Extraocular movements intact.  Cardiovascular:     Rate and Rhythm: Normal rate and regular rhythm.     Pulses: Normal pulses.     Heart sounds: Normal heart sounds.  Pulmonary:     Effort: Pulmonary effort is normal.     Breath sounds: Normal breath sounds.  Musculoskeletal:     Cervical back: Normal range of motion.  Lymphadenopathy:     Cervical: Cervical adenopathy present.  Skin:    General: Skin is warm and dry.  Neurological:     Mental Status: She is alert and oriented to person, place, and time. Mental status is at baseline.  Psychiatric:        Mood and Affect: Mood normal.        Behavior: Behavior normal.     UC Treatments / Results  Labs (all labs ordered are listed, but only abnormal results are displayed) Labs Reviewed - No data to display  EKG   Radiology No results found.  Procedures Procedures (including critical care time)  Medications Ordered in UC Medications - No data to display  Initial Impression / Assessment and Plan / UC Course  I have reviewed the triage vital signs and the nursing notes.  Pertinent labs & imaging results that were available during my care of the patient were reviewed by me and considered in my medical decision making (see chart for details).  Acute bacterial rhinosinusitis  Have been present for 2 weeks with no improvement, history of sinusitis, will trial use of antibiotic, prescribed doxycycline 7-day course, recommended continued use of over-the-counter medications may attempt use of Mucinex and saline irrigation in addition, discussed with patient, given walking referral to ear nose and throat  for additional follow-up as needed  Final Clinical Impressions(s) / UC Diagnoses   Final diagnoses:  None   Discharge Instructions   None    ED Prescriptions   None    PDMP not reviewed this encounter.   Hans Eden, NP 03/17/21 1330

## 2021-03-19 ENCOUNTER — Ambulatory Visit: Payer: Medicaid Other | Admitting: Family Medicine

## 2021-03-20 LAB — CULTURE, GROUP A STREP (THRC)

## 2021-04-06 DIAGNOSIS — Z79891 Long term (current) use of opiate analgesic: Secondary | ICD-10-CM | POA: Diagnosis not present

## 2021-04-06 DIAGNOSIS — M5136 Other intervertebral disc degeneration, lumbar region: Secondary | ICD-10-CM | POA: Diagnosis not present

## 2021-04-06 DIAGNOSIS — G894 Chronic pain syndrome: Secondary | ICD-10-CM | POA: Diagnosis not present

## 2021-05-04 DIAGNOSIS — Z79891 Long term (current) use of opiate analgesic: Secondary | ICD-10-CM | POA: Diagnosis not present

## 2021-05-04 DIAGNOSIS — G894 Chronic pain syndrome: Secondary | ICD-10-CM | POA: Diagnosis not present

## 2021-05-04 DIAGNOSIS — G8929 Other chronic pain: Secondary | ICD-10-CM | POA: Diagnosis not present

## 2021-05-04 DIAGNOSIS — M5136 Other intervertebral disc degeneration, lumbar region: Secondary | ICD-10-CM | POA: Diagnosis not present

## 2021-05-06 DIAGNOSIS — J329 Chronic sinusitis, unspecified: Secondary | ICD-10-CM | POA: Diagnosis not present

## 2021-05-06 DIAGNOSIS — R519 Headache, unspecified: Secondary | ICD-10-CM | POA: Diagnosis not present

## 2021-05-12 DIAGNOSIS — F410 Panic disorder [episodic paroxysmal anxiety] without agoraphobia: Secondary | ICD-10-CM | POA: Diagnosis not present

## 2021-05-12 DIAGNOSIS — F321 Major depressive disorder, single episode, moderate: Secondary | ICD-10-CM | POA: Diagnosis not present

## 2021-05-12 DIAGNOSIS — Z7189 Other specified counseling: Secondary | ICD-10-CM | POA: Diagnosis not present

## 2021-05-12 DIAGNOSIS — F4312 Post-traumatic stress disorder, chronic: Secondary | ICD-10-CM | POA: Diagnosis not present

## 2021-06-02 DIAGNOSIS — G8929 Other chronic pain: Secondary | ICD-10-CM | POA: Diagnosis not present

## 2021-06-02 DIAGNOSIS — G894 Chronic pain syndrome: Secondary | ICD-10-CM | POA: Diagnosis not present

## 2021-06-02 DIAGNOSIS — Z79891 Long term (current) use of opiate analgesic: Secondary | ICD-10-CM | POA: Diagnosis not present

## 2021-06-02 DIAGNOSIS — M5136 Other intervertebral disc degeneration, lumbar region: Secondary | ICD-10-CM | POA: Diagnosis not present

## 2021-06-12 ENCOUNTER — Other Ambulatory Visit: Payer: Self-pay | Admitting: Family Medicine

## 2021-06-16 ENCOUNTER — Telehealth: Payer: Self-pay | Admitting: *Deleted

## 2021-06-16 DIAGNOSIS — E038 Other specified hypothyroidism: Secondary | ICD-10-CM

## 2021-06-16 DIAGNOSIS — Z79899 Other long term (current) drug therapy: Secondary | ICD-10-CM

## 2021-06-16 NOTE — Telephone Encounter (Signed)
Patient informed of md message and recommendations. She states she is tolerating the medications well. Refill sent in. ?

## 2021-06-16 NOTE — Telephone Encounter (Signed)
Patient says she needs refills on her Cymbalta and  Synthroid.  Please advise. Thank you ?

## 2021-06-16 NOTE — Telephone Encounter (Signed)
1.  Please verify that patient is tolerating this medicine with Cymbalta ?2.  Patient should be aware that sometimes this medication with Cymbalta can increase her risk of serotonin syndrome which can cause elevated blood pressure nausea vomiting body aches ?3.  If patient says she is tolerating and wants to have refill of this medicine may have 90-day refill but needs to do a follow-up office visit by early summer ?

## 2021-06-16 NOTE — Telephone Encounter (Signed)
May have 90-day ?Needs to do TSH, free T4, metabolic 7, lipid ?Needs follow-up office visit within 3 months please go ahead and schedule now because schedule gets tight ?

## 2021-06-17 MED ORDER — LEVOTHYROXINE SODIUM 100 MCG PO TABS: 100.0000 ug | ORAL_TABLET | Freq: Every day | ORAL | 0 refills | Status: DC

## 2021-06-17 MED ORDER — DULOXETINE HCL 60 MG PO CPEP: ORAL_CAPSULE | ORAL | 0 refills | Status: DC

## 2021-06-17 NOTE — Telephone Encounter (Signed)
[facility].. labs ordered and medications sent to pharmacy. ?

## 2021-06-17 NOTE — Telephone Encounter (Signed)
Patient informed of md message.  She states she is willing to come off the Cymbalta but she needs something for depression that would be safe for her to take. Please advise. Thank you ?

## 2021-06-17 NOTE — Telephone Encounter (Signed)
It is not necessarily a new interaction it is just being more that current literature says this is a combination we should try to avoid because it can increase the risk of what is known as serotonin syndrome which can cause nausea headaches high blood pressure sweating and in some cases make a person sick enough to be in the hospital.  I would recommend that she look at both medicines and decide which one is more important and we come away from the other. ?It would be wise for the patient to consider doing an office visit somewhere in the near future to discuss this in detail ?If she cannot come to the office this could be done as a video visit as well ? ?

## 2021-06-17 NOTE — Telephone Encounter (Signed)
Patient returned call and has been informed per Drs notes and recommendations.  ?She has questions about any new possible interactions between the elavil and duloxetine. Please advise ?

## 2021-07-13 NOTE — Telephone Encounter (Signed)
Patient advised per Dr Lorin Picket: communicated with psychiatry-they state that these medications can be used together as long as she is not having any significant nausea and vomiting issues therefore for now Dr Lorin Picket would recommend keeping the current Cymbalta and amitriptyline plus also recommend standard follow-up sometime this summer. Patient verbalized understanding. ?

## 2021-07-13 NOTE — Telephone Encounter (Signed)
Please let patient know I communicated with psychiatry-they state that these medications can be used together as long as she is not having any significant nausea and vomiting issues therefore for now I would recommend keeping the current Cymbalta and amitriptyline plus also recommend standard follow-up sometime this summer thank you ?

## 2021-07-14 DIAGNOSIS — G894 Chronic pain syndrome: Secondary | ICD-10-CM | POA: Diagnosis not present

## 2021-07-14 DIAGNOSIS — Z79891 Long term (current) use of opiate analgesic: Secondary | ICD-10-CM | POA: Diagnosis not present

## 2021-07-14 DIAGNOSIS — M5136 Other intervertebral disc degeneration, lumbar region: Secondary | ICD-10-CM | POA: Diagnosis not present

## 2021-08-08 ENCOUNTER — Ambulatory Visit (HOSPITAL_COMMUNITY)
Admission: EM | Admit: 2021-08-08 | Discharge: 2021-08-08 | Disposition: A | Discharge status: Home / Self Care | Payer: Medicaid Other | Attending: Emergency Medicine | Admitting: Emergency Medicine

## 2021-08-08 ENCOUNTER — Encounter (HOSPITAL_COMMUNITY): Payer: Self-pay | Admitting: Emergency Medicine

## 2021-08-08 DIAGNOSIS — J0141 Acute recurrent pansinusitis: Secondary | ICD-10-CM

## 2021-08-08 MED ORDER — FLUTICASONE PROPIONATE 50 MCG/ACT NA SUSP: 1.0000 | Freq: Every day | NASAL | 1 refills | Status: AC

## 2021-08-08 MED ORDER — AMOXICILLIN-POT CLAVULANATE 875-125 MG PO TABS: 1.0000 | ORAL_TABLET | Freq: Two times a day (BID) | ORAL | 0 refills | Status: AC

## 2021-08-08 NOTE — Discharge Instructions (Addendum)
The Augmentin twice daily with food for 10 days for treatment of your sinusitis.  Perform sinus irrigation 2-3 times a day with a [employer] sinus rinse kit and distilled water.  Do not use tap water.  You can use plain over-the-counter Mucinex every 6 hours to break up the stickiness of the mucus so your body can clear it.  Your symptoms today are consistent with a sinus infection, sinus infections are typically caused by viruses meaning we must give the body time to fight off the infection before attempting use of antibiotics  Take Flonase every morning, this medication is a steroid nasal spray which helps to loosen secretions out of the sinus passageway as well as to reduce the amount of secretions present  Take an antihistamine such as Claritin or Zyrtec, this medication reduces the amount of secretions that the body will produce  If you develop any new or worsening symptoms return for reevaluation or see your primary care provider.

## 2021-08-08 NOTE — ED Triage Notes (Signed)
Symptoms on-going x 4 days. Hx of recurrent sinus problems. States she hasn't been back to see ENT due to financial issues. Reports she has a major sinus infection and is requesting augmentin.

## 2021-08-08 NOTE — ED Provider Notes (Signed)
[place]    CSN: [id number] Arrival date & time: 08/08/21  1012      History   Chief Complaint Chief Complaint  Patient presents with   URI    HPI Kimberly Weiss is a 41 y.o. female.   Patient presents with low-grade fevers, nasal congestion, bilateral ear fullness, rhinorrhea, postnasal drip, sore throat, severe sinus pain and pressure, nonproductive cough and nausea without vomiting for 4 days.  Has attempted use of Aleve, Excedrin, as Astelin nasal spray which has been ineffective.  Decreased appetite but tolerating fluids.  No known sick contacts.  History of reoccurring sinus infections, followed by ear nose and throat.  Endorses that she is unable to complete a follow-up appointment at this time due to finances.  Endorses history of tonsillectomy, nasal septum reconstruction and removal of bone spurs within the nasal cavity to help reduce sinus infections  Past Medical History:  Diagnosis Date   Anemia    Anxiety    Chronic lower back pain    Daily headache    "related to stress and sinus" (02/20/2015)   Depression    GERD (gastroesophageal reflux disease)    GI bleed    History of blood transfusion 04/2014   "related to duodenal ulcer"   History of duodenal ulcer 04/2014   "perforated"   History of kidney stones    Hypertension    Hypothyroidism    Interstitial cystitis    Pneumonia ~ 2008   Renal stones    Thyroid disease     Patient Active Problem List   Diagnosis Date Noted   Acute bacterial rhinosinusitis 04/03/2020   Nausea 04/03/2020   Bilateral sciatica 06/16/2015   S/P laparoscopic hernia repair 02/20/2015   Hypocalcemia 06/03/2014   Diarrhea 05/16/2014   Chronic narcotic use 05/16/2014   Duodenal ulcer with hemorrhage    Hemorrhagic shock ([place])    Gastrointestinal hemorrhage associated with duodenal ulcer    GI bleed    Other specified hypotension    Duodenal ulcer    Hypovolemic shock ([place])    Tobacco abuse    Other  specified hypothyroidism    Anxiety state    Head trauma    Tachycardia    UGIB (upper gastrointestinal bleed) 04/27/2014   Acute blood loss anemia    Anxiety and depression 11/03/2012   Dysfunctional uterine bleeding 06/10/2012   Interstitial cystitis 05/22/2012   Hypothyroidism 05/22/2012    Past Surgical History:  Procedure Laterality Date   BLADDER INSTILLATION  2004   CESAREAN SECTION  05/13/2004   ESOPHAGOGASTRODUODENOSCOPY N/A 04/27/2014   Procedure: ESOPHAGOGASTRODUODENOSCOPY (EGD);  Surgeon: Lear Ng, MD;  Location: [facility] ENDOSCOPY;  Service: Endoscopy;  Laterality: N/A;   ESOPHAGOGASTRODUODENOSCOPY (EGD) WITH PROPOFOL N/A 05/03/2014   Procedure: ESOPHAGOGASTRODUODENOSCOPY (EGD) WITH PROPOFOL;  Surgeon: Lear Ng, MD;  Location: [place];  Service: Endoscopy;  Laterality: N/A;   HERNIA REPAIR     INCISIONAL HERNIA REPAIR N/A 02/20/2015   Procedure: LAPAROSCOPIC INCISIONAL HERNIA;  Surgeon: Georganna Skeans, MD;  Location: [place];  Service: General;  Laterality: N/A;   INSERTION OF MESH N/A 02/20/2015   Procedure: INSERTION OF MESH;  Surgeon: Georganna Skeans, MD;  Location: [place];  Service: General;  Laterality: N/A;   LAPAROSCOPIC INCISIONAL / UMBILICAL / [place]  02/20/2015   IHR w/mesh & LOA   LAPAROTOMY N/A 05/07/2014   Procedure: EXPLORATORY LAPAROTOMY;  Surgeon: Georganna Skeans, MD;  Location: [place];  Service: General;  Laterality: N/A;  LITHOTRIPSY  2004   NASAL SINUS SURGERY  05/17/2008   "removed bone spurs in my sinus cavity"   REPAIR OF PERFORATED ULCER N/A 05/07/2014   Procedure: OVERSOWE DUODENAL ULCER;  Surgeon: Georganna Skeans, MD;  Location: [place];  Service: General;  Laterality: N/A;   TONSILLECTOMY  05/17/2008    OB History   No obstetric history on file.      Home Medications    Prior to Admission medications   Medication Sig Start Date End Date Taking? Authorizing Provider  albuterol (VENTOLIN HFA) 108 (90 Base) MCG/ACT  inhaler INHALE 2 PUFFS INTO THE LUNGS EVERY 4 HOURS AS NEEDED FOR WHEEZING/SHORTNESS OF BREATH 07/11/20   Kathyrn Drown, MD  amitriptyline (ELAVIL) 50 MG tablet TAKE 1 TABLET BY MOUTH EVERYDAY AT BEDTIME 06/16/21   Luking, Scott A, MD  azelastine (ASTELIN) 0.1 % nasal spray Place 2 sprays into both nostrils 2 (two) times daily. 11/17/20   Kathyrn Drown, MD  clindamycin (CLEOCIN) 300 MG capsule Take one capsule po three times a day for next 10 days Patient not taking: Reported on 03/17/2021 12/23/20   Kathyrn Drown, MD  DULoxetine (CYMBALTA) 60 MG capsule TAKE 1 CAPSULE BY MOUTH EVERY DAY 06/17/21   Kathyrn Drown, MD  fluconazole (DIFLUCAN) 150 MG tablet 1 now and repeat in 7 days Patient not taking: Reported on 03/17/2021 11/17/20   Kathyrn Drown, MD  hydrochlorothiazide (HYDRODIURIL) 25 MG tablet 1 qam as needed swelling in the legs 02/11/20   Kathyrn Drown, MD  levothyroxine (SYNTHROID) 100 MCG tablet Take 1 tablet (100 mcg total) by mouth daily. 06/17/21   Kathyrn Drown, MD  Oxycodone HCl 10 MG TABS Take by mouth. Every 6 hours via pain management DrCrisp    [provider]  promethazine (PHENERGAN) 25 MG tablet TAKE 1 TABLET BY MOUTH EVERY 8 HOURS AS NEEDED. 03/12/21   Kathyrn Drown, MD    Family History Family History  Problem Relation Age of Onset   Heart Problems Mother    Heart Problems Father     Social History Social History   Tobacco Use   Smoking status: Every Day    Packs/day: 1.50    Years: 20.00    Total pack years: 30.00    Types: Cigarettes   Smokeless tobacco: Never  Substance Use Topics   Alcohol use: No   Drug use: No     Allergies   Gabapentin, Zofran [ondansetron hcl], and Opana [oxymorphone hcl]   Review of Systems Review of Systems  Constitutional:  Positive for fever. Negative for activity change, appetite change, chills, diaphoresis, fatigue and unexpected weight change.  HENT:  Positive for congestion, ear pain, postnasal drip,  rhinorrhea, sinus pressure, sinus pain and sore throat. Negative for dental problem, drooling, ear discharge, facial swelling, hearing loss, mouth sores, nosebleeds, sneezing, tinnitus, trouble swallowing and voice change.   Respiratory:  Positive for cough. Negative for apnea, choking, chest tightness, shortness of breath, wheezing and stridor.   Cardiovascular: Negative.   Gastrointestinal:  Positive for nausea. Negative for abdominal distention, abdominal pain, anal bleeding, blood in stool, constipation, diarrhea, rectal pain and vomiting.  Skin: Negative.   Neurological: Negative.      Physical Exam Triage Vital Signs ED Triage Vitals  Enc Vitals Group     BP 08/08/21 1059 130/85     Pulse Rate 08/08/21 1059 100     Resp 08/08/21 1059 16     Temp 08/08/21 1059  98.3 F (36.8 C)     Temp Source 08/08/21 1059 Oral     SpO2 08/08/21 1059 97 %     Weight --      Height --      Head Circumference --      Peak Flow --      Pain Score 08/08/21 1100 8     Pain Loc --      Pain Edu? --      Excl. in [place]? --    No data found.  Updated Vital Signs BP 130/85 (BP Location: Left Arm)   Pulse 100   Temp 98.3 F (36.8 C) (Oral)   Resp 16   SpO2 97%   Visual Acuity Right Eye Distance:   Left Eye Distance:   Bilateral Distance:    Right Eye Near:   Left Eye Near:    Bilateral Near:     Physical Exam Constitutional:      Appearance: She is ill-appearing.  HENT:     Head: Normocephalic.     Right Ear: Hearing, ear canal and external ear normal. A middle ear effusion is present.     Left Ear: Hearing, ear canal and external ear normal. A middle ear effusion is present.     Nose: Congestion and rhinorrhea present.     Right Turbinates: Swollen.     Left Turbinates: Swollen.     Right Sinus: Maxillary sinus tenderness and frontal sinus tenderness present.     Left Sinus: Maxillary sinus tenderness and frontal sinus tenderness present.     Mouth/Throat:     Pharynx: Posterior  oropharyngeal erythema present. No oropharyngeal exudate.  Eyes:     Extraocular Movements: Extraocular movements intact.  Cardiovascular:     Rate and Rhythm: Normal rate and regular rhythm.     Pulses: Normal pulses.     Heart sounds: Normal heart sounds.  Pulmonary:     Effort: Pulmonary effort is normal.     Breath sounds: Normal breath sounds.  Musculoskeletal:     Cervical back: Normal range of motion.  Lymphadenopathy:     Cervical: Cervical adenopathy present.  Skin:    General: Skin is warm and dry.  Neurological:     Mental Status: She is alert and oriented to person, place, and time. Mental status is at baseline.  Psychiatric:        Mood and Affect: Mood normal.        Behavior: Behavior normal.      UC Treatments / Results  Labs (all labs ordered are listed, but only abnormal results are displayed) Labs Reviewed - No data to display  EKG   Radiology No results found.  Procedures Procedures (including critical care time)  Medications Ordered in UC Medications - No data to display  Initial Impression / Assessment and Plan / UC Course  I have reviewed the triage vital signs and the nursing notes.  Pertinent labs & imaging results that were available during my care of the patient were reviewed by me and considered in my medical decision making (see chart for details).  Recurrent pansinusitis  Vital signs are stable and well ill-appearing patient is in no signs of distress, symptomology is consistent with sinus infection and due to history we will begin coverage for bacteria, Augmentin 10-day course prescribed as well as Flonase as there is swelling within the nasal turbinates, recommended continued use of over-the-counter medications for additional supportive care, may follow-up with this urgent care, PCP or  GI nose and throat specialist as needed for reevaluation or reoccurrence of symptoms Final Clinical Impressions(s) / UC Diagnoses   Final diagnoses:   None   Discharge Instructions   None    ED Prescriptions   None    PDMP not reviewed this encounter.   Hans Eden, NP 08/08/21 1146

## 2021-08-11 DIAGNOSIS — G894 Chronic pain syndrome: Secondary | ICD-10-CM | POA: Diagnosis not present

## 2021-08-11 DIAGNOSIS — M5136 Other intervertebral disc degeneration, lumbar region: Secondary | ICD-10-CM | POA: Diagnosis not present

## 2021-08-11 DIAGNOSIS — Z79891 Long term (current) use of opiate analgesic: Secondary | ICD-10-CM | POA: Diagnosis not present

## 2021-08-11 DIAGNOSIS — M5430 Sciatica, unspecified side: Secondary | ICD-10-CM | POA: Diagnosis not present

## 2021-09-02 ENCOUNTER — Other Ambulatory Visit: Payer: Self-pay | Admitting: Family Medicine

## 2021-09-08 ENCOUNTER — Other Ambulatory Visit: Payer: Self-pay | Admitting: Family Medicine

## 2021-09-08 DIAGNOSIS — M5136 Other intervertebral disc degeneration, lumbar region: Secondary | ICD-10-CM | POA: Diagnosis not present

## 2021-09-08 DIAGNOSIS — G894 Chronic pain syndrome: Secondary | ICD-10-CM | POA: Diagnosis not present

## 2021-09-08 DIAGNOSIS — M48062 Spinal stenosis, lumbar region with neurogenic claudication: Secondary | ICD-10-CM | POA: Diagnosis not present

## 2021-09-24 ENCOUNTER — Other Ambulatory Visit: Payer: Self-pay | Admitting: Family Medicine

## 2021-09-25 NOTE — Telephone Encounter (Signed)
Sent my chart message to schedule appointment 09/25/21

## 2021-10-06 DIAGNOSIS — G894 Chronic pain syndrome: Secondary | ICD-10-CM | POA: Diagnosis not present

## 2021-10-06 DIAGNOSIS — M5136 Other intervertebral disc degeneration, lumbar region: Secondary | ICD-10-CM | POA: Diagnosis not present

## 2021-10-06 DIAGNOSIS — M48062 Spinal stenosis, lumbar region with neurogenic claudication: Secondary | ICD-10-CM | POA: Diagnosis not present

## 2021-11-03 DIAGNOSIS — M48062 Spinal stenosis, lumbar region with neurogenic claudication: Secondary | ICD-10-CM | POA: Diagnosis not present

## 2021-11-03 DIAGNOSIS — G894 Chronic pain syndrome: Secondary | ICD-10-CM | POA: Diagnosis not present

## 2021-11-03 DIAGNOSIS — M5136 Other intervertebral disc degeneration, lumbar region: Secondary | ICD-10-CM | POA: Diagnosis not present

## 2021-11-30 DIAGNOSIS — G894 Chronic pain syndrome: Secondary | ICD-10-CM | POA: Diagnosis not present

## 2021-11-30 DIAGNOSIS — M5136 Other intervertebral disc degeneration, lumbar region: Secondary | ICD-10-CM | POA: Diagnosis not present

## 2021-11-30 DIAGNOSIS — M48062 Spinal stenosis, lumbar region with neurogenic claudication: Secondary | ICD-10-CM | POA: Diagnosis not present

## 2021-12-18 ENCOUNTER — Other Ambulatory Visit: Payer: Self-pay | Admitting: Family Medicine

## 2022-01-05 DIAGNOSIS — M48062 Spinal stenosis, lumbar region with neurogenic claudication: Secondary | ICD-10-CM | POA: Diagnosis not present

## 2022-01-05 DIAGNOSIS — M5136 Other intervertebral disc degeneration, lumbar region: Secondary | ICD-10-CM | POA: Diagnosis not present

## 2022-01-05 DIAGNOSIS — G894 Chronic pain syndrome: Secondary | ICD-10-CM | POA: Diagnosis not present

## 2022-01-05 DIAGNOSIS — Z79891 Long term (current) use of opiate analgesic: Secondary | ICD-10-CM | POA: Diagnosis not present

## 2022-01-28 DIAGNOSIS — R519 Headache, unspecified: Secondary | ICD-10-CM | POA: Diagnosis not present

## 2022-02-03 DIAGNOSIS — R519 Headache, unspecified: Secondary | ICD-10-CM | POA: Diagnosis not present

## 2022-02-03 DIAGNOSIS — J3489 Other specified disorders of nose and nasal sinuses: Secondary | ICD-10-CM | POA: Diagnosis not present

## 2022-02-09 DIAGNOSIS — E782 Mixed hyperlipidemia: Secondary | ICD-10-CM | POA: Diagnosis not present

## 2022-02-09 DIAGNOSIS — E669 Obesity, unspecified: Secondary | ICD-10-CM | POA: Diagnosis not present

## 2022-02-09 DIAGNOSIS — E039 Hypothyroidism, unspecified: Secondary | ICD-10-CM | POA: Diagnosis not present

## 2022-02-09 DIAGNOSIS — I10 Essential (primary) hypertension: Secondary | ICD-10-CM | POA: Diagnosis not present

## 2022-02-09 DIAGNOSIS — F17210 Nicotine dependence, cigarettes, uncomplicated: Secondary | ICD-10-CM | POA: Diagnosis not present

## 2022-02-09 DIAGNOSIS — R7309 Other abnormal glucose: Secondary | ICD-10-CM | POA: Diagnosis not present

## 2022-02-09 DIAGNOSIS — R7303 Prediabetes: Secondary | ICD-10-CM | POA: Diagnosis not present

## 2022-02-12 ENCOUNTER — Encounter: Payer: Self-pay | Admitting: Neurology

## 2022-02-23 NOTE — Progress Notes (Unsigned)
NEUROLOGY CONSULTATION NOTE  Kimberly Weiss MRN: [id number] DOB: May 30, 1980  Referring provider: Christia Reading, MD Primary care provider: Lilyan Punt, MD  Reason for consult:  headache  Assessment/Plan:   Chronic migraine without aura, without status migrainosus, not intractable  Migraine prevention:  Start Ajovy every 28 days.  Has tried and failed amitriptyline and topiramate Migraine rescue:  Rizatriptan 10mg  for severe migraines.  Excedrin for mild headaches.  Limit use of pain relievers total to 2 days out of week.  OK to take oxycodone for back pain if needed.  Promethazine for nausea Limit use of pain relievers to no more than 2 days out of week to prevent risk of rebound or medication-overuse headache. Keep headache diary Follow up 4-5 months.    Subjective:  Kimberly Weiss is a 41 year old female who presents for headaches.  History supplemented by referring provider's note.  Onset:  Longstanding history of sinus problems.  Underwent sinus surgery in 2010.  She did well until 2021, when headaches returned. Location:  starts across forehead and around eyes and then wraps around head Quality:  squeezing Aura:  absent Prodrome:  absent Associated symptoms:  Nausea, photophobia.  She denies associated vomiting, visual disturbance, unilateral numbness or weakness. Duration:  lasts until she falls asleep and wakes up next morning Frequency:  Mild headaches occur 2-3 times a week.  Severe headaches occur 1 to 2 days a week. Frequency of abortive medication: Excedrin daily for headaches.  Oxycodone daily for chronic back pain Triggers:  sun glare, seasonal allergies Relieving factors:  sleep Activity:  movement/routine daily activity aggravates  Excedrin helps sinus headache, not helps migraine  Saw ENT.  CT of sinues on 02/03/2022 revealed no significant paranasal sinus disease.  Past NSAIDS/analgesics:  tramadol, ibuprofen Past abortive triptans:  none Past  abortive ergotamine:  none Past muscle relaxants:  Robaxin Past anti-emetic:  none Past antihypertensive medications:  none Past antidepressant medications:  venlafaxine Past anticonvulsant medications:  Topiramate, Lyrica Past anti-CGRP:  none Past vitamins/Herbal/Supplements:  none Past antihistamines/decongestants:  none Other past therapies:  none  Current NSAIDS/analgesics:  oxycodone Current triptans:  none Current ergotamine:  none Current anti-emetic:  promethazine 25mg  Current muscle relaxants:  none Current Antihypertensive medications:  HCTZ Current Antidepressant medications:  amitriptyline 50mg  QHS (for interstitial cystitis), duloxetine 60mg  daily Current Anticonvulsant medications:  none Current anti-CGRP:  none Current Vitamins/Herbal/Supplements:  Flonase Current Antihistamines/Decongestants:  Flonase Other therapy:  none Birth control:  none Other medications:  levothyroxine   Caffeine:  No coffee.  Soda twice a week at most.  No tea. Diet:  Drinks a lot of water.  Skips breakfast and lunch.  Eats late afternoon snack and dinner Exercise:  limited due to back pain Depression:  yes; Anxiety:  yes Other pain:  chronic low back pain Sleep hygiene:  poor Family history of headache:  mother, sister (migraines), brother (migraines)      PAST MEDICAL HISTORY: Past Medical History:  Diagnosis Date   Anemia    Anxiety    Chronic lower back pain    Daily headache    "related to stress and sinus" (02/20/2015)   Depression    GERD (gastroesophageal reflux disease)    GI bleed    History of blood transfusion 04/2014   "related to duodenal ulcer"   History of duodenal ulcer 04/2014   "perforated"   History of kidney stones    Hypertension    Hypothyroidism    Interstitial cystitis  Pneumonia ~ 2008   Renal stones    Thyroid disease     PAST SURGICAL HISTORY: Past Surgical History:  Procedure Laterality Date   BLADDER INSTILLATION  2004   CESAREAN  SECTION  05/13/2004   ESOPHAGOGASTRODUODENOSCOPY N/A 04/27/2014   Procedure: ESOPHAGOGASTRODUODENOSCOPY (EGD);  Surgeon: Lear Ng, MD;  Location: [facility] ENDOSCOPY;  Service: Endoscopy;  Laterality: N/A;   ESOPHAGOGASTRODUODENOSCOPY (EGD) WITH PROPOFOL N/A 05/03/2014   Procedure: ESOPHAGOGASTRODUODENOSCOPY (EGD) WITH PROPOFOL;  Surgeon: Lear Ng, MD;  Location: [place];  Service: Endoscopy;  Laterality: N/A;   HERNIA REPAIR     INCISIONAL HERNIA REPAIR N/A 02/20/2015   Procedure: LAPAROSCOPIC INCISIONAL HERNIA;  Surgeon: Georganna Skeans, MD;  Location: [place];  Service: General;  Laterality: N/A;   INSERTION OF MESH N/A 02/20/2015   Procedure: INSERTION OF MESH;  Surgeon: Georganna Skeans, MD;  Location: [place];  Service: General;  Laterality: N/A;   LAPAROSCOPIC INCISIONAL / UMBILICAL / [place]  02/20/2015   IHR w/mesh & LOA   LAPAROTOMY N/A 05/07/2014   Procedure: EXPLORATORY LAPAROTOMY;  Surgeon: Georganna Skeans, MD;  Location: [place];  Service: General;  Laterality: N/A;   LITHOTRIPSY  2004   NASAL SINUS SURGERY  05/17/2008   "removed bone spurs in my sinus cavity"   REPAIR OF PERFORATED ULCER N/A 05/07/2014   Procedure: OVERSOWE DUODENAL ULCER;  Surgeon: Georganna Skeans, MD;  Location: [place];  Service: General;  Laterality: N/A;   TONSILLECTOMY  05/17/2008    MEDICATIONS: Current Outpatient Medications on File Prior to Visit  Medication Sig Dispense Refill   amitriptyline (ELAVIL) 50 MG tablet TAKE 1 TABLET BY MOUTH EVERYDAY AT BEDTIME 90 tablet 0   azelastine (ASTELIN) 0.1 % nasal spray Place 2 sprays into both nostrils 2 (two) times daily. 30 mL 12   clindamycin (CLEOCIN) 300 MG capsule Take one capsule po three times a day for next 10 days (Patient not taking: Reported on 03/17/2021) 30 capsule 0   DULoxetine (CYMBALTA) 60 MG capsule TAKE 1 CAPSULE BY MOUTH EVERY DAY 90 capsule 0   fluconazole (DIFLUCAN) 150 MG tablet 1 now and repeat in 7 days (Patient not  taking: Reported on 03/17/2021) 2 tablet 1   fluticasone (FLONASE) 50 MCG/ACT nasal spray Place 1 spray into both nostrils daily. 11.1 mL 1   hydrochlorothiazide (HYDRODIURIL) 25 MG tablet TAKE 1/2 TO 1 TABLETS BY MOUTH EVERY MORNING AS NEEDED FOR SWELLING 90 tablet 0   levothyroxine (SYNTHROID) 100 MCG tablet Take 1 tablet (100 mcg total) by mouth daily. 90 tablet 0   Oxycodone HCl 10 MG TABS Take by mouth. Every 6 hours via pain management DrCrisp     PROAIR HFA 108 (90 Base) MCG/ACT inhaler INHALE 2 PUFFS EVERY 4 HOURS AS NEEDED FOR WHEEZING/SHORTNESS OF BREATH 8.5 each 3   promethazine (PHENERGAN) 25 MG tablet TAKE 1 TABLET BY MOUTH EVERY 8 HOURS AS NEEDED. 20 tablet 1   No current facility-administered medications on file prior to visit.    ALLERGIES: Allergies  Allergen Reactions   Gabapentin Other (See Comments)    "Walking in a fog"   Zofran [Ondansetron Hcl] Other (See Comments)    Loopy / Spaced out feeling when given PO can take IV   Opana [Oxymorphone Hcl] Other (See Comments)    Severe abdominal pain    FAMILY HISTORY: Family History  Problem Relation Age of Onset   Heart Problems Mother    Heart Problems Father  Objective:  Blood pressure 128/87, pulse (!) 102, height 5\' 3"  (1.6 m), weight 179 lb (81.2 kg), SpO2 95 %. General: No acute distress.  Patient appears well-groomed.   Head:  Normocephalic/atraumatic Eyes:  fundi examined but not visualized Neck: supple, bilateral paraspinal tenderness, full range of motion Heart: regular rate and rhythm Lungs: Clear to auscultation bilaterally. Vascular: No carotid bruits. Neurological Exam: Mental status: alert and oriented to person, place, and time, speech fluent and not dysarthric, language intact. Cranial nerves: CN I: not tested CN II: pupils equal, round and reactive to light, visual fields intact CN III, IV, VI:  full range of motion, no nystagmus, no ptosis CN V: facial sensation intact. CN VII: upper  and lower face symmetric CN VIII: hearing intact CN IX, X: gag intact, uvula midline CN XI: sternocleidomastoid and trapezius muscles intact CN XII: tongue midline Bulk & Tone: normal, no fasciculations. Motor:  muscle strength with limited effort due to back pain. Sensation:  Temperature and vibratory sensation intact. Deep Tendon Reflexes:  1+ throughout,  toes downgoing.   Finger to nose testing:  Without dysmetria.   Gait:  Antalgic gait.      Thank you for allowing me to take part in the care of this patient.  Metta Clines, DO  CC:  Sallee Lange, MD  Melida Quitter, MD

## 2022-02-24 ENCOUNTER — Telehealth: Payer: Self-pay

## 2022-02-24 ENCOUNTER — Encounter: Payer: Self-pay | Admitting: Neurology

## 2022-02-24 ENCOUNTER — Ambulatory Visit: Payer: Medicaid Other | Admitting: Neurology

## 2022-02-24 VITALS — BP 128/87 | HR 102 | Ht 63.0 in | Wt 179.0 lb

## 2022-02-24 DIAGNOSIS — G43709 Chronic migraine without aura, not intractable, without status migrainosus: Secondary | ICD-10-CM

## 2022-02-24 MED ORDER — RIZATRIPTAN BENZOATE 10 MG PO TABS: 10.0000 mg | ORAL_TABLET | ORAL | 5 refills | Status: DC | PRN

## 2022-02-24 MED ORDER — AJOVY 225 MG/1.5ML ~~LOC~~ SOAJ: 225.0000 mg | SUBCUTANEOUS | 11 refills | Status: DC

## 2022-02-24 NOTE — Telephone Encounter (Signed)
Patient seen in the office today, per Dr.Jaffe please start a PA for Ajovy 225 mg

## 2022-02-24 NOTE — Patient Instructions (Addendum)
  Start Ajovy every 28 days.  For mild headaches, take Excedrin. For more severe migraines, take rizatriptan as directed. Limit use of pain relievers to no more than 2 days out of the week.  These medications include acetaminophen, NSAIDs (ibuprofen/Advil/Motrin, naproxen/Aleve, triptans (Imitrex/sumatriptan), Excedrin, and narcotics.  This will help reduce risk of rebound headaches. Be aware of common food triggers:  - Caffeine:  coffee, black tea, cola, Mt. Dew  - Chocolate  - Dairy:  aged cheeses (brie, blue, cheddar, gouda, [place], provolone, [place], Swiss, etc), chocolate milk, buttermilk, sour cream, limit eggs and yogurt  - Nuts, peanut butter  - Alcohol  - Cereals/grains:  FRESH breads (fresh bagels, sourdough, doughnuts), yeast productions  - Processed/canned/aged/cured meats (pre-packaged deli meats, hotdogs)  - MSG/glutamate:  soy sauce, flavor enhancer, pickled/preserved/marinated foods  - Sweeteners:  aspartame (Equal, Nutrasweet).  Sugar and Splenda are okay  - Vegetables:  legumes (lima beans, lentils, snow peas, fava beans, pinto peans, peas, garbanzo beans), sauerkraut, onions, olives, pickles  - Fruit:  avocados, bananas, citrus fruit (orange, lemon, grapefruit), mango  - Other:  Frozen meals, macaroni and cheese Routine exercise Stay adequately hydrated (aim for 64 oz water daily) Keep headache diary Maintain proper stress management Maintain proper sleep hygiene Do not skip meals Consider supplements:  magnesium citrate 400mg  daily, riboflavin 400mg  daily, coenzyme Q10 100mg  three times daily. Follow up 4 to 5 months.

## 2022-02-26 ENCOUNTER — Other Ambulatory Visit (HOSPITAL_COMMUNITY): Payer: Self-pay

## 2022-02-26 ENCOUNTER — Telehealth: Payer: Self-pay | Admitting: Pharmacy Technician

## 2022-02-26 DIAGNOSIS — Z79899 Other long term (current) drug therapy: Secondary | ICD-10-CM

## 2022-02-26 NOTE — Telephone Encounter (Signed)
PA has been initiated. Please respond to other telephone encounter. Pt will need a pregnancy test to submit the PA.

## 2022-02-26 NOTE — Telephone Encounter (Signed)
Tried calling patient , No answer, Per Dr.Jaffe,Please check pregnancy test

## 2022-02-26 NOTE — Telephone Encounter (Signed)
Received notification from [employer] that prior authorization for AJOVY 225MG  is required.   PA NOT submitted on 12.29.23 Key [id number] Status is pending

## 2022-02-26 NOTE — Telephone Encounter (Signed)
Patient advised. Lab ordered patient will stop by next week to give a urine.

## 2022-03-09 NOTE — Telephone Encounter (Signed)
Update patient will have her Pregnancy test done this week sometime.

## 2022-03-11 DIAGNOSIS — J0191 Acute recurrent sinusitis, unspecified: Secondary | ICD-10-CM | POA: Diagnosis not present

## 2022-03-11 DIAGNOSIS — N3010 Interstitial cystitis (chronic) without hematuria: Secondary | ICD-10-CM | POA: Diagnosis not present

## 2022-03-11 DIAGNOSIS — F17210 Nicotine dependence, cigarettes, uncomplicated: Secondary | ICD-10-CM | POA: Diagnosis not present

## 2022-04-27 NOTE — Telephone Encounter (Signed)
Received notification from  [facility]  regarding a prior authorization for  Ajovy . Authorization has been APPROVED from 04/27/22 to 07/26/22.   Authorization # Key: [id number] - PA Case ID: [id number]

## 2022-04-27 NOTE — Telephone Encounter (Signed)
Patient advised.

## 2022-07-08 ENCOUNTER — Ambulatory Visit: Payer: Medicaid Other | Admitting: Neurology

## 2022-07-14 NOTE — Progress Notes (Deleted)
NEUROLOGY FOLLOW UP OFFICE NOTE  Kimberly Weiss [id number]  Assessment/Plan:   Chronic migraine without aura, without status migrainosus, not intractable   Migraine prevention:  Ajovy every 4 weeks *** Migraine rescue:  Rizatriptan 10mg  for severe migraines.  Excedrin for mild headaches.  Limit u se of pain relievers total to 2 days out of week. Promethazine for nausea *** Limit use of pain relievers to no more than 2 days out of week to prevent risk of rebound or medication-overuse headache. Keep headache diary Follow up ***.       Subjective:  Kimberly Weiss is a 42 year old female who follows up for migraines.  UPDATE: Started Arnetha Massy. rizatriptan  Intensity:  *** Duration:  *** Frequency:  *** Frequency of abortive medication: *** Current NSAIDS/analgesics:  oxycodone Current triptans:  rizatriptan 10mg   Current ergotamine:  none Current anti-emetic:  promethazine 25mg  Current muscle relaxants:  none Current Antihypertensive medications:  HCTZ Current Antidepressant medications:  amitriptyline 50mg  QHS (for interstitial cystitis), duloxetine 60mg  daily Current Anticonvulsant medications:  none Current anti-CGRP:  Ajovy Current Vitamins/Herbal/Supplements:  Flonase Current Antihistamines/Decongestants:  Flonase Other therapy:  none Birth control:  none Other medications:  levothyroxine     Caffeine:  No coffee.  Soda twice a week at most.  No tea. Diet:  Drinks a lot of water.  Skips breakfast and lunch.  Eats late afternoon snack and dinner Exercise:  limited due to back pain Depression:  yes; Anxiety:  yes Other pain:  chronic low back pain Sleep hygiene:  poor  HISTORY:  Onset:  Longstanding history of sinus problems.  Underwent sinus surgery in 2010.  She did well until 2021, when headaches returned. Location:  starts across forehead and around eyes and then wraps around head Quality:  squeezing Aura:  absent Prodrome:  absent Associated symptoms:   Nausea, photophobia.  She denies associated vomiting, visual disturbance, unilateral numbness or weakness. Duration:  lasts until she falls asleep and wakes up next morning Frequency:  Mild headaches occur 2-3 times a week.  Severe headaches occur 1 to 2 days a week. Frequency of abortive medication: Excedrin daily for headaches.  Oxycodone daily for chronic back pain Triggers:  sun glare, seasonal allergies Relieving factors:  sleep Activity:  movement/routine daily activity aggravates   Excedrin helps sinus headache, not helps migraine   Saw ENT.  CT of sinues on 02/03/2022 revealed no significant paranasal sinus disease.   Past NSAIDS/analgesics:  tramadol, ibuprofen Past abortive triptans:  none Past abortive ergotamine:  none Past muscle relaxants:  Robaxin Past anti-emetic:  none Past antihypertensive medications:  none Past antidepressant medications:  venlafaxine Past anticonvulsant medications:  Topiramate, Lyrica Past anti-CGRP:  none Past vitamins/Herbal/Supplements:  none Past antihistamines/decongestants:  none Other past therapies:  none    Family history of headache:  mother, sister (migraines), brother (migraines)  PAST MEDICAL HISTORY: Past Medical History:  Diagnosis Date   Anemia    Anxiety    Chronic lower back pain    Daily headache    "related to stress and sinus" (02/20/2015)   Depression    GERD (gastroesophageal reflux disease)    GI bleed    History of blood transfusion 04/2014   "related to duodenal ulcer"   History of duodenal ulcer 04/2014   "perforated"   History of kidney stones    Hypertension    Hypothyroidism    Interstitial cystitis    Pneumonia ~ 2008   Renal stones    Thyroid  disease     MEDICATIONS: Current Outpatient Medications on File Prior to Visit  Medication Sig Dispense Refill   amitriptyline (ELAVIL) 50 MG tablet TAKE 1 TABLET BY MOUTH EVERYDAY AT BEDTIME 90 tablet 0   azelastine (ASTELIN) 0.1 % nasal spray Place 2  sprays into both nostrils 2 (two) times daily. 30 mL 12   DULoxetine (CYMBALTA) 60 MG capsule TAKE 1 CAPSULE BY MOUTH EVERY DAY 90 capsule 0   fluconazole (DIFLUCAN) 150 MG tablet 1 now and repeat in 7 days 2 tablet 1   fluticasone (FLONASE) 50 MCG/ACT nasal spray Place 1 spray into both nostrils daily. 11.1 mL 1   Fremanezumab-vfrm (AJOVY) 225 MG/1.5ML SOAJ Inject 225 mg into the skin every 28 (twenty-eight) days. 1.68 mL 11   hydrochlorothiazide (HYDRODIURIL) 25 MG tablet TAKE 1/2 TO 1 TABLETS BY MOUTH EVERY MORNING AS NEEDED FOR SWELLING 90 tablet 0   levothyroxine (SYNTHROID) 100 MCG tablet Take 1 tablet (100 mcg total) by mouth daily. 90 tablet 0   Oxycodone HCl 10 MG TABS Take by mouth. Every 6 hours via pain management DrCrisp     PROAIR HFA 108 (90 Base) MCG/ACT inhaler INHALE 2 PUFFS EVERY 4 HOURS AS NEEDED FOR WHEEZING/SHORTNESS OF BREATH 8.5 each 3   promethazine (PHENERGAN) 25 MG tablet TAKE 1 TABLET BY MOUTH EVERY 8 HOURS AS NEEDED. 20 tablet 1   rizatriptan (MAXALT) 10 MG tablet Take 1 tablet (10 mg total) by mouth as needed for migraine. May repeat in 2 hours if needed.  Maximum 2 tablets in 24 hours. 10 tablet 5   No current facility-administered medications on file prior to visit.    ALLERGIES: Allergies  Allergen Reactions   Gabapentin Other (See Comments)    "Walking in a fog"   Zofran [Ondansetron Hcl] Other (See Comments)    Loopy / Spaced out feeling when given PO can take IV   Opana [Oxymorphone Hcl] Other (See Comments)    Severe abdominal pain    FAMILY HISTORY: Family History  Problem Relation Age of Onset   Heart Problems Mother    Heart Problems Father    Migraines Sister    Seizures Brother    Migraines Brother       Objective:  *** General: No acute distress.  Patient appears well-groomed.   Head:  Normocephalic/atraumatic Eyes:  Fundi examined but not visualized Neck: supple, no paraspinal tenderness, full range of motion Heart:  Regular rate  and rhythm Neurological Exam: ***   Shon Millet, DO  CC: Lilyan Punt, MD

## 2022-07-19 ENCOUNTER — Ambulatory Visit: Payer: Medicaid Other | Admitting: Neurology

## 2022-07-19 ENCOUNTER — Encounter: Payer: Self-pay | Admitting: Neurology

## 2022-07-30 ENCOUNTER — Other Ambulatory Visit (HOSPITAL_COMMUNITY): Payer: Self-pay

## 2022-07-30 ENCOUNTER — Telehealth: Payer: Self-pay | Admitting: Pharmacy Technician

## 2022-07-30 NOTE — Telephone Encounter (Signed)
Patient Advocate Encounter  Received notification from [employer] that prior authorization for AJOVY 225MG  is required.   PA submitted on 5.31.24 Key [id number] Status is pending

## 2022-08-04 NOTE — Telephone Encounter (Signed)
Patient Advocate Encounter  Prior Authorization for AJOVY (fremanezumab-vfrm) injection 225MG /1.5ML auto-injectors has been approved through [employer] [place].    Key: [id number]  Effective: 07-30-2022 to 07-30-2023

## 2022-08-19 ENCOUNTER — Ambulatory Visit (HOSPITAL_COMMUNITY)
Admission: EM | Admit: 2022-08-19 | Discharge: 2022-08-19 | Disposition: A | Discharge status: Home / Self Care | Payer: Medicaid Other | Attending: Family Medicine | Admitting: Family Medicine

## 2022-08-19 ENCOUNTER — Encounter (HOSPITAL_COMMUNITY): Payer: Self-pay | Admitting: Emergency Medicine

## 2022-08-19 ENCOUNTER — Other Ambulatory Visit: Payer: Self-pay

## 2022-08-19 DIAGNOSIS — H9201 Otalgia, right ear: Secondary | ICD-10-CM | POA: Diagnosis not present

## 2022-08-19 MED ORDER — KETOROLAC TROMETHAMINE 60 MG/2ML IM SOLN
INTRAMUSCULAR | Status: AC
  Filled 2022-08-19: qty 2

## 2022-08-19 MED ORDER — KETOROLAC TROMETHAMINE 60 MG/2ML IM SOLN
60.0000 mg | Freq: Once | INTRAMUSCULAR | Status: AC
  Administered 2022-08-19: 60 mg via INTRAMUSCULAR

## 2022-08-19 MED ORDER — AMOXICILLIN 875 MG PO TABS
875.0000 mg | ORAL_TABLET | Freq: Two times a day (BID) | ORAL | 0 refills | Status: AC
Start: 2022-08-19 — End: 2022-08-29

## 2022-08-19 NOTE — Discharge Instructions (Signed)
Meds ordered this encounter  Medications   ketorolac (TORADOL) injection 60 mg   amoxicillin (AMOXIL) 875 MG tablet    Sig: Take 1 tablet (875 mg total) by mouth 2 (two) times daily for 10 days.    Dispense:  20 tablet    Refill:  0    

## 2022-08-19 NOTE — ED Triage Notes (Signed)
Pt c/o right ear pain and sinus infection since Sunday shoot to head, neck and jaw.

## 2022-08-19 NOTE — ED Provider Notes (Signed)
[facility]   [id number] 08/19/22 Arrival Time: 0915  ASSESSMENT & PLAN:  1. Otalgia of right ear    Will treat for early OM. Cannot r/o TMJ etiology. Discussed.  Meds ordered this encounter  Medications   ketorolac (TORADOL) injection 60 mg   amoxicillin (AMOXIL) 875 MG tablet    Sig: Take 1 tablet (875 mg total) by mouth 2 (two) times daily for 10 days.    Dispense:  20 tablet    Refill:  0   Reviewed expectations re: course of current medical issues. Questions answered. Outlined signs and symptoms indicating need for more acute intervention. Patient verbalized understanding. After Visit Summary given.   SUBJECTIVE: History from: patient. Kimberly Weiss is a 42 y.o. female who presents with complaint of right ear pain and sinus pressure; x few days. Denies ear drainage/bleeding. Normal hearing. Denies fever.  Social History   Tobacco Use  Smoking Status Every Day   Packs/day: 1.50   Years: 20.00   Additional pack years: 0.00   Total pack years: 30.00   Types: Cigarettes  Smokeless Tobacco Never   OBJECTIVE:  Vitals:   08/19/22 0959 08/19/22 1000  BP: 117/77   Pulse: (!) 104   Resp: 18   Temp: 98.7 F (37.1 C)   TempSrc: Oral   SpO2: 98%   Weight:  82 kg  Height:  5\' 3"  (1.6 m)    General appearance: alert; appears fatigued Ear Canal: normal TM: right: mild erythema and bulging Neck: supple without LAD Lungs: unlabored respirations, symmetrical air entry; cough: absent; no respiratory distress Skin: warm and dry Psychological: alert and cooperative; normal mood and affect  Allergies  Allergen Reactions   Gabapentin Other (See Comments)    "Walking in a fog"   Zofran [Ondansetron Hcl] Other (See Comments)    Loopy / Spaced out feeling when given PO can take IV   Opana [Oxymorphone Hcl] Other (See Comments)    Severe abdominal pain    Past Medical History:  Diagnosis Date   Anemia    Anxiety    Chronic lower back pain    Daily  headache    "related to stress and sinus" (02/20/2015)   Depression    GERD (gastroesophageal reflux disease)    GI bleed    History of blood transfusion 04/2014   "related to duodenal ulcer"   History of duodenal ulcer 04/2014   "perforated"   History of kidney stones    Hypertension    Hypothyroidism    Interstitial cystitis    Pneumonia ~ 2008   Renal stones    Thyroid disease    Family History  Problem Relation Age of Onset   Heart Problems Mother    Heart Problems Father    Migraines Sister    Seizures Brother    Migraines Brother    Social History   Socioeconomic History   Marital status: Widowed    Spouse name: Not on file   Number of children: Not on file   Years of education: Not on file   Highest education level: Not on file  Occupational History   Not on file  Tobacco Use   Smoking status: Every Day    Packs/day: 1.50    Years: 20.00    Additional pack years: 0.00    Total pack years: 30.00    Types: Cigarettes   Smokeless tobacco: Never  Substance and Sexual Activity   Alcohol use: No   Drug use: No  Sexual activity: Not Currently    Birth control/protection: None  Other Topics Concern   Not on file  Social History Narrative   Are you right handed or left handed? left   Are you currently employed ? NO    What is your current occupation?   Do you live at home alone?   Who lives with you?    What type of home do you live in: 1 story or 2 story? 1       Social Determinants of Corporate investment banker Strain: Not on file  Food Insecurity: Not on file  Transportation Needs: Not on file  Physical Activity: Not on file  Stress: Not on file  Social Connections: Not on file  Intimate Partner Violence: Not on file             Mardella Layman, MD 08/19/22 1117

## 2023-07-08 ENCOUNTER — Ambulatory Visit (HOSPITAL_COMMUNITY)
Admission: EM | Admit: 2023-07-08 | Discharge: 2023-07-08 | Disposition: A | Discharge status: Home / Self Care | Attending: Family Medicine | Admitting: Family Medicine

## 2023-07-08 ENCOUNTER — Encounter (HOSPITAL_COMMUNITY): Payer: Self-pay

## 2023-07-08 DIAGNOSIS — K047 Periapical abscess without sinus: Secondary | ICD-10-CM | POA: Diagnosis not present

## 2023-07-08 MED ORDER — CLINDAMYCIN HCL 300 MG PO CAPS: 300.0000 mg | ORAL_CAPSULE | Freq: Three times a day (TID) | ORAL | 0 refills | Status: AC

## 2023-07-08 NOTE — Discharge Instructions (Addendum)
 You were seen today for a dental infection.  I have sent out an antibiotic to help with this, and any potential sinus infection. You may use over the counter medication for pain as needed.  Follow up with your oral surgeon as planned.

## 2023-07-08 NOTE — ED Provider Notes (Signed)
 [facility]    CSN: [id number] Arrival date & time: 07/08/23  0800      History   Chief Complaint Chief Complaint  Patient presents with   Dental Pain   Otalgia   sinus pressure    HPI Kimberly Weiss is a 43 y.o. female.    Dental Pain Associated symptoms: congestion   Otalgia Associated symptoms: congestion    Patient is here for several issues.  She has pain to the right lower tooth, as well as pain and an abscess to the right upper tooth, canine.  She did have a telehealth, and given doxy.  This made her sick and did not clear this all the way  Having pain into the right sinus area, right ear.  She does have an oral surgeon, and planning to have surgery for this.       Past Medical History:  Diagnosis Date   Anemia    Anxiety    Chronic lower back pain    Daily headache    "related to stress and sinus" (02/20/2015)   Depression    GERD (gastroesophageal reflux disease)    GI bleed    History of blood transfusion 04/2014   "related to duodenal ulcer"   History of duodenal ulcer 04/2014   "perforated"   History of kidney stones    Hypertension    Hypothyroidism    Interstitial cystitis    Pneumonia ~ 2008   Renal stones    Thyroid  disease     Patient Active Problem List   Diagnosis Date Noted   Acute bacterial rhinosinusitis 04/03/2020   Nausea 04/03/2020   Bilateral sciatica 06/16/2015   S/P laparoscopic hernia repair 02/20/2015   Hypocalcemia 06/03/2014   Diarrhea 05/16/2014   Chronic narcotic use 05/16/2014   Duodenal ulcer with hemorrhage    Hemorrhagic shock (HCC)    Gastrointestinal hemorrhage associated with duodenal ulcer    GI bleed    Other specified hypotension    Duodenal ulcer    Hypovolemic shock (HCC)    Tobacco abuse    Other specified hypothyroidism    Anxiety state    Head trauma    Tachycardia    UGIB (upper gastrointestinal bleed) 04/27/2014   Acute blood loss anemia    Anxiety and depression 11/03/2012    Dysfunctional uterine bleeding 06/10/2012   Interstitial cystitis 05/22/2012   Hypothyroidism 05/22/2012    Past Surgical History:  Procedure Laterality Date   BLADDER INSTILLATION  2004   CESAREAN SECTION  05/13/2004   ESOPHAGOGASTRODUODENOSCOPY N/A 04/27/2014   Procedure: ESOPHAGOGASTRODUODENOSCOPY (EGD);  Surgeon: Yvetta Herbert, MD;  Location: [facility] ENDOSCOPY;  Service: Endoscopy;  Laterality: N/A;   ESOPHAGOGASTRODUODENOSCOPY (EGD) WITH PROPOFOL  N/A 05/03/2014   Procedure: ESOPHAGOGASTRODUODENOSCOPY (EGD) WITH PROPOFOL ;  Surgeon: Yvetta Herbert, MD;  Location: [facility] ENDOSCOPY;  Service: Endoscopy;  Laterality: N/A;   HERNIA REPAIR     INCISIONAL HERNIA REPAIR N/A 02/20/2015   Procedure: LAPAROSCOPIC INCISIONAL HERNIA;  Surgeon: Dorena Gander, MD;  Location: [facility] OR;  Service: General;  Laterality: N/A;   INSERTION OF MESH N/A 02/20/2015   Procedure: INSERTION OF MESH;  Surgeon: Dorena Gander, MD;  Location: [facility] OR;  Service: General;  Laterality: N/A;   LAPAROSCOPIC INCISIONAL / UMBILICAL / VENTRAL HERNIA REPAIR  02/20/2015   IHR w/mesh & LOA   LAPAROTOMY N/A 05/07/2014   Procedure: EXPLORATORY LAPAROTOMY;  Surgeon: Dorena Gander, MD;  Location: [facility] OR;  Service: General;  Laterality: N/A;   LITHOTRIPSY  2004   NASAL SINUS SURGERY  05/17/2008   "removed bone spurs in my sinus cavity"   REPAIR OF PERFORATED ULCER N/A 05/07/2014   Procedure: OVERSOWE DUODENAL ULCER;  Surgeon: Dorena Gander, MD;  Location: [facility] OR;  Service: General;  Laterality: N/A;   TONSILLECTOMY  05/17/2008    OB History   No obstetric history on file.      Home Medications    Prior to Admission medications   Medication Sig Start Date End Date Taking? Authorizing Provider  amitriptyline  (ELAVIL ) 50 MG tablet TAKE 1 TABLET BY MOUTH EVERYDAY AT BEDTIME 09/09/21   Luking, Jackelyn Marvel, MD  azelastine  (ASTELIN ) 0.1 % nasal spray Place 2 sprays into both nostrils 2 (two) times daily. 11/17/20   Bennet Brasil, MD   DULoxetine  (CYMBALTA ) 60 MG capsule TAKE 1 CAPSULE BY MOUTH EVERY DAY 06/17/21   Bennet Brasil, MD  fluconazole  (DIFLUCAN ) 150 MG tablet 1 now and repeat in 7 days 11/17/20   Bennet Brasil, MD  fluticasone  (FLONASE ) 50 MCG/ACT nasal spray Place 1 spray into both nostrils daily. 08/08/21   White, Maybelle Spatz, NP  Fremanezumab -vfrm (AJOVY ) 225 MG/1.5ML SOAJ Inject 225 mg into the skin every 28 (twenty-eight) days. 02/24/22   Festus Hubert, Adam R, DO  hydrochlorothiazide  (HYDRODIURIL ) 25 MG tablet TAKE 1/2 TO 1 TABLETS BY MOUTH EVERY MORNING AS NEEDED FOR SWELLING 09/25/21   Bennet Brasil, MD  levothyroxine  (SYNTHROID ) 100 MCG tablet Take 1 tablet (100 mcg total) by mouth daily. 06/17/21   Bennet Brasil, MD  Oxycodone  HCl 10 MG TABS Take by mouth. Every 6 hours via pain management DrCrisp    [provider]  PROAIR  HFA 108 (90 Base) MCG/ACT inhaler INHALE 2 PUFFS EVERY 4 HOURS AS NEEDED FOR WHEEZING/SHORTNESS OF BREATH 09/02/21   Luking, Jackelyn Marvel, MD  promethazine  (PHENERGAN ) 25 MG tablet TAKE 1 TABLET BY MOUTH EVERY 8 HOURS AS NEEDED. 03/12/21   Bennet Brasil, MD  rizatriptan  (MAXALT ) 10 MG tablet Take 1 tablet (10 mg total) by mouth as needed for migraine. May repeat in 2 hours if needed.  Maximum 2 tablets in 24 hours. 02/24/22   Merriam Abbey, DO    Family History Family History  Problem Relation Age of Onset   Heart Problems Mother    Heart Problems Father    Migraines Sister    Seizures Brother    Migraines Brother     Social History Social History   Tobacco Use   Smoking status: Every Day    Current packs/day: 1.50    Average packs/day: 1.5 packs/day for 20.0 years (30.0 ttl pk-yrs)    Types: Cigarettes   Smokeless tobacco: Never  Vaping Use   Vaping status: Never Used  Substance Use Topics   Alcohol use: No   Drug use: No     Allergies   Gabapentin, Zofran  [ondansetron  hcl], and Opana [oxymorphone hcl]   Review of Systems Review of Systems  Constitutional:  Negative.   HENT:  Positive for congestion, ear pain, sinus pressure and sinus pain.   Respiratory: Negative.    Cardiovascular: Negative.   Gastrointestinal: Negative.   Musculoskeletal: Negative.   Psychiatric/Behavioral: Negative.       Physical Exam Triage Vital Signs ED Triage Vitals  Encounter Vitals Group     BP 07/08/23 0817 104/71     Systolic BP Percentile --      Diastolic BP Percentile --      Pulse Rate 07/08/23 0817 Aaron Aas)  122     Resp 07/08/23 0817 14     Temp 07/08/23 0817 98.1 F (36.7 C)     Temp Source 07/08/23 0817 Oral     SpO2 07/08/23 0817 96 %     Weight --      Height --      Head Circumference --      Peak Flow --      Pain Score 07/08/23 0816 7     Pain Loc --      Pain Education --      Exclude from Growth Chart --    No data found.  Updated Vital Signs BP 104/71 (BP Location: Left Arm)   Pulse (!) 122   Temp 98.1 F (36.7 C) (Oral)   Resp 14   LMP 06/30/2023   SpO2 96%   Visual Acuity Right Eye Distance:   Left Eye Distance:   Bilateral Distance:    Right Eye Near:   Left Eye Near:    Bilateral Near:     Physical Exam Constitutional:      Appearance: Normal appearance.  HENT:     Right Ear: Tympanic membrane normal.     Left Ear: Tympanic membrane normal.     Nose: Congestion present.     Right Sinus: Maxillary sinus tenderness present.     Left Sinus: Maxillary sinus tenderness present.     Mouth/Throat:     Mouth: Mucous membranes are moist.     Comments: Poor dentition;  cracked right lower tooth;  upper right teeth are rotted Cardiovascular:     Rate and Rhythm: Normal rate and regular rhythm.  Pulmonary:     Effort: Pulmonary effort is normal.     Breath sounds: Normal breath sounds.  Neurological:     General: No focal deficit present.     Mental Status: She is alert.  Psychiatric:        Mood and Affect: Mood normal.      UC Treatments / Results  Labs (all labs ordered are listed, but only abnormal  results are displayed) Labs Reviewed - No data to display  EKG   Radiology No results found.  Procedures Procedures (including critical care time)  Medications Ordered in UC Medications - No data to display  Initial Impression / Assessment and Plan / UC Course  I have reviewed the triage vital signs and the nursing notes.  Pertinent labs & imaging results that were available during my care of the patient were reviewed by me and considered in my medical decision making (see chart for details).   Final Clinical Impressions(s) / UC Diagnoses   Final diagnoses:  Dental infection     Discharge Instructions      You were seen today for a dental infection.  I have sent out an antibiotic to help with this, and any potential sinus infection. You may use over the counter medication for pain as needed.  Follow up with your oral surgeon as planned.   ED Prescriptions     Medication Sig Dispense Auth. Provider   clindamycin  (CLEOCIN ) 300 MG capsule Take 1 capsule (300 mg total) by mouth 3 (three) times daily for 7 days. 21 capsule Lesle Ras, MD      PDMP not reviewed this encounter.   Lesle Ras, MD 07/08/23 [phone]

## 2023-07-08 NOTE — ED Triage Notes (Addendum)
 Patient reports that she has dental abscesses of the right lower and mid upper , sinus pressure and right ear pain x 1 month.  Patient states she has been taking Oxycodone  10 mg for her back pain, but the pain medicine does not help with her dental pain.  Patient states she has been taking 2 different nasal sprays, Aleve. And saline nasal spray.

## 2023-10-10 ENCOUNTER — Ambulatory Visit: Payer: Self-pay | Admitting: *Deleted

## 2023-10-10 NOTE — Telephone Encounter (Signed)
 Pt called in wanting an appt with Dr. Glendia Fielding.   Her LOV 11/17/2020 so the system is requiring she be established as a new pt.  Pt is upset and crying because she was seeing him but due to financial issues and she moved she has been going to urgent care.   She is wanting to start seeing him again.   I really trusted him and he was good.   I really want to start seeing him again.   He is not accepting new pts and he is booked out for several months.   I talked with someone at [facility] about the situation.  The person that can handle this situation will not be available until this Wed.   Office staff asked that I send a message over and they will handle it from there but they won't be able to let pt know anything until Wed.    I let pt know this and confirmed the number in the chart was correct.   She kept asking,   Are they really going to call me?   I let her know they would give her a call.   Dr. Fielding is booked out several months so I wasn't able to get her in without her being established as a new pt due to the way the system is set up so that's why someone would need to call her back on Wed. Who could work with her on this matter.   Pt. Was finally agreeable to this plan.   I sent my information to [facility].    The Patient Access Specialist also spoke to someone in the office about her situation too before she was transferred to me because he could not schedule her either.   She needed to be established as a new pt.     FYI Only or Action Required?: Action required by provider: request for appointment.  Patient was last seen in primary care on 11/17/2020 by Fielding Glendia LABOR, MD.  Called Nurse Triage reporting Anxiety. Pt upset and crying because she really wants to start seeing Dr. Fielding again.  Symptoms began today. When she found out she would have to establish as a new pt and Dr. Fielding is booked out and also not taking pts.  Interventions attempted:  Other:  Family Medicine is going to contact her on Wed.  Symptoms are: stable.  Triage Disposition: Information or Advice Only Call  Patient/caregiver understands and will follow disposition?: Yes          FYI Only or Action Required?: Action required by provider: request for appointment.  Patient was last seen in primary care on 11/17/2020 by Fielding Glendia LABOR, MD.  Called Nurse Triage reporting Anxiety.  Symptoms began today.  Interventions attempted: Other: practice will be calling pt on Wed.  Symptoms are: stable.  Triage Disposition: See PCP Within 2 Weeks  Patient/caregiver understands and will follow disposition?: YesCopied from CRM [id number]. Topic: Clinical - Red Word Triage >> Oct 10, 2023  3:44 PM Rosaria BRAVO wrote: Red Word that prompted transfer to Nurse Triage: Emotional distress, received concerning news about her thyroid  medication. Very worried. Distraught.    ----------------------------------------------------------------------- From previous Reason for Contact - Scheduling: Patient/patient representative is calling to schedule an appointment. Refer to attachments for appointment information. Reason for Disposition  [1] Symptoms of anxiety or panic attack AND [2] is a chronic symptom (recurrent or ongoing AND present > 4 weeks)    Just wanting  an appt with Dr Alphonsa.  Answer Assessment - Initial Assessment Questions 1. CONCERN: Did anything happen that prompted you to call today?      Pt upset and crying.    I have thyroid  issues.   I had blood work done recently.   I trust Dr. Alphonsa.    2. ANXIETY SYMPTOMS: Can you describe how you (your loved one; patient) have been feeling? (e.g., tense, restless, panicky, anxious, keyed up, overwhelmed, sense of impending doom).      I trust him very much.   I just need an appt with Dr Alphonsa.    Pt has been going to urgent care for her thyroid  care and medication adjustments for a while.    Anxious  and really wants to see Dr. Alphonsa. 3. ONSET: How long have you been feeling this way? (e.g., hours, days, weeks)     Pt needing to be scheduled.    4. SEVERITY: How would you rate the level of anxiety? (e.g., 0 - 10; or mild, moderate, severe).     Very anxious because wanting to see Dr. Alphonsa 5. FUNCTIONAL IMPAIRMENT: How have these feelings affected your ability to do daily activities? Have you had more difficulty than usual doing your normal daily activities? (e.g., getting better, same, worse; self-care, school, work, interactions)     I'm just upset because I really want to see Dr. Alphonsa. 6. HISTORY: Have you felt this way before? Have you ever been diagnosed with an anxiety problem in the past? (e.g., generalized anxiety disorder, panic attacks, PTSD). If Yes, ask: How was this problem treated? (e.g., medicines, counseling, etc.)     Did not go any further with triage as she really wanted to be scheduled with Dr Alphonsa but it had been since 2022 since she was seen and the computer was blocking the patient access specialist from being able to schedule her.    Practice said we could schedule. 7. RISK OF HARM - SUICIDAL IDEATION: Do you ever have thoughts of hurting or killing yourself? If Yes, ask:  Do you have these feelings now? Do you have a plan on how you would do this?      8. TREATMENT:  What has been done so far to treat this anxiety? (e.g., medicines, relaxation strategies). What has helped?      9. THERAPIST: Do you have a counselor or therapist? If Yes, ask: What is their name?      10. POTENTIAL TRIGGERS: Do you drink caffeinated beverages (e.g., coffee, colas, teas), and how much daily? Do you drink alcohol or use any drugs? Have you started any new medicines recently?        11. PATIENT SUPPORT: Who is with you now? Who do you live with? Do you have family or friends who you can talk to?         12. OTHER SYMPTOMS: Do you have any other  symptoms? (e.g., feeling depressed, trouble concentrating, trouble sleeping, trouble breathing, palpitations or fast heartbeat, chest pain, sweating, nausea, or diarrhea)        13. PREGNANCY: Is there any chance you are pregnant? When was your last menstrual period?  Protocols used: Anxiety and Panic Attack-A-AH

## 2023-10-11 NOTE — Telephone Encounter (Signed)
 Per Dr Glendia- He is not taking any new patients- patient can establish care with courtney PA at her next available new patient appointment or we have a new NP Damien coming in November if she would like to wait and establish with them

## 2023-10-11 NOTE — Telephone Encounter (Signed)
 Patient scheduled 10/27/2023 at 11:00 AM  with Charmaine Grooms PA

## 2023-10-27 ENCOUNTER — Ambulatory Visit (INDEPENDENT_AMBULATORY_CARE_PROVIDER_SITE_OTHER): Admitting: Physician Assistant

## 2023-10-27 ENCOUNTER — Encounter: Payer: Self-pay | Admitting: Physician Assistant

## 2023-10-27 VITALS — BP 115/84 | HR 87 | Temp 98.2°F | Ht 63.0 in | Wt 165.0 lb

## 2023-10-27 DIAGNOSIS — E038 Other specified hypothyroidism: Secondary | ICD-10-CM

## 2023-10-27 DIAGNOSIS — G4700 Insomnia, unspecified: Secondary | ICD-10-CM | POA: Insufficient documentation

## 2023-10-27 DIAGNOSIS — Z13220 Encounter for screening for lipoid disorders: Secondary | ICD-10-CM

## 2023-10-27 DIAGNOSIS — M47817 Spondylosis without myelopathy or radiculopathy, lumbosacral region: Secondary | ICD-10-CM | POA: Insufficient documentation

## 2023-10-27 DIAGNOSIS — M51369 Other intervertebral disc degeneration, lumbar region without mention of lumbar back pain or lower extremity pain: Secondary | ICD-10-CM | POA: Insufficient documentation

## 2023-10-27 DIAGNOSIS — Z720 Tobacco use: Secondary | ICD-10-CM | POA: Diagnosis not present

## 2023-10-27 DIAGNOSIS — F411 Generalized anxiety disorder: Secondary | ICD-10-CM | POA: Diagnosis not present

## 2023-10-27 DIAGNOSIS — M47812 Spondylosis without myelopathy or radiculopathy, cervical region: Secondary | ICD-10-CM | POA: Insufficient documentation

## 2023-10-27 DIAGNOSIS — G894 Chronic pain syndrome: Secondary | ICD-10-CM | POA: Insufficient documentation

## 2023-10-27 DIAGNOSIS — D72829 Elevated white blood cell count, unspecified: Secondary | ICD-10-CM

## 2023-10-27 MED ORDER — BUSPIRONE HCL 7.5 MG PO TABS
7.5000 mg | ORAL_TABLET | Freq: Two times a day (BID) | ORAL | 1 refills | Status: DC
Start: 2023-10-27 — End: 2023-12-14

## 2023-10-27 NOTE — Assessment & Plan Note (Signed)
 Current treatment with Cymbalta  is ineffective for anxiety. Discussed Buspar  as a non-addictive alternative with no increased risk with pain medications. - Initiate Buspar  at 7.5 mg BID for six weeks. - Schedule follow-up in six weeks to assess response to Buspar . - Evaluated potential interactions of Buspar  with current medications. - Patient counseled on side effects such as nausea and headache, she was advised to stop taking medication if she experiences suicidal ideation.

## 2023-10-27 NOTE — Progress Notes (Signed)
 New Patient Office Visit  Subjective    Patient ID: Kimberly Weiss, female    DOB: December 24, 1980  Age: 43 y.o. MRN: [id number]  CC:  Chief Complaint  Patient presents with   New Patient (Initial Visit)    Here to reestablish care.     HPI Kimberly Weiss presents to establish care  Discussed the use of AI scribe software for clinical note transcription with the patient, who gave verbal consent to proceed.  History of Present Illness Kimberly Weiss is a 43 year old female with chronic pain syndrome, hypothyroidism, and major depression who presents for re-establishment of care and medication management.  She is re-establishing care after a period of absence due to disability and financial constraints. Her previous provider, who managed her medications including thyroid  medication, hydrochlorothiazide , duloxetine , and amitriptyline  is no longer accepting new patients.  She follows with a pain management provider who is relocating and requires a new referral for pain management. She does not wish to follow her current provider to the new location.  She has been on Synthroid  for hypothyroidism since 2012-2014, starting at 50 micrograms and currently on 100 micrograms. Thyroid  function has not been monitored in many years. She is currently on duloxetine  for depression, which she feels does not adequately address her anxiety.  She has interstitial cystitis diagnosed in 2000, managed with amitriptyline  since 2004, which she finds very effective. She experiences significant pain if she misses doses.  She is on hydrochlorothiazide  for water retention, with a family history of hypertension. She drinks a lot of water and finds the medication helpful.  She smokes a pack of cigarettes a day and is interested in smoking cessation, planning to quit in September. She has concerns about the cost of cessation aids and is exploring over-the-counter options.  She has difficulty falling and staying  asleep, and endorses significant anxiety.    Outpatient Encounter Medications as of 10/27/2023  Medication Sig   amitriptyline  (ELAVIL ) 50 MG tablet TAKE 1 TABLET BY MOUTH EVERYDAY AT BEDTIME   azelastine  (ASTELIN ) 0.1 % nasal spray Place 2 sprays into both nostrils 2 (two) times daily.   baclofen  (LIORESAL ) 10 MG tablet Take 10 mg by mouth 2 (two) times daily.   busPIRone  (BUSPAR ) 7.5 MG tablet Take 1 tablet (7.5 mg total) by mouth 2 (two) times daily.   Calcium Carbonate Antacid (TUMS PO) Take by mouth.   DULoxetine  (CYMBALTA ) 60 MG capsule TAKE 1 CAPSULE BY MOUTH EVERY DAY   fluticasone  (FLONASE ) 50 MCG/ACT nasal spray Place 1 spray into both nostrils daily.   hydrochlorothiazide  (HYDRODIURIL ) 25 MG tablet TAKE 1/2 TO 1 TABLETS BY MOUTH EVERY MORNING AS NEEDED FOR SWELLING   levothyroxine  (SYNTHROID ) 100 MCG tablet Take 1 tablet (100 mcg total) by mouth daily.   Multiple Vitamins-Minerals (WOMENS MULTIVITAMIN PO) Take by mouth 2 (two) times daily.   Oxycodone  HCl 10 MG TABS Take by mouth. Every 6 hours via pain management DrCrisp   PROAIR  HFA 108 (90 Base) MCG/ACT inhaler INHALE 2 PUFFS EVERY 4 HOURS AS NEEDED FOR WHEEZING/SHORTNESS OF BREATH   promethazine  (PHENERGAN ) 25 MG tablet TAKE 1 TABLET BY MOUTH EVERY 8 HOURS AS NEEDED.   [DISCONTINUED] albuterol  (VENTOLIN  HFA) 108 (90 Base) MCG/ACT inhaler Inhale into the lungs.   No facility-administered encounter medications on file as of 10/27/2023.    Past Medical History:  Diagnosis Date   Anemia    Anxiety    Chronic lower back pain    Daily  headache    related to stress and sinus (02/20/2015)   Depression    Duodenal ulcer with hemorrhage    GERD (gastroesophageal reflux disease)    GI bleed    History of blood transfusion 04/30/2014   related to duodenal ulcer   History of duodenal ulcer 04/30/2014   perforated   History of kidney stones    Hypertension    Hypothyroidism    Interstitial cystitis    Pneumonia ~ 2008    Renal stones    Thyroid  disease     Past Surgical History:  Procedure Laterality Date   BLADDER INSTILLATION  2004   CESAREAN SECTION  05/13/2004   ESOPHAGOGASTRODUODENOSCOPY N/A 04/27/2014   Procedure: ESOPHAGOGASTRODUODENOSCOPY (EGD);  Surgeon: Weiss KYM Sol, MD;  Location[facility] END[facility]COPY;  Service: Endoscopy;  Laterality: N/A;   ESOPHAGOGASTRODUODENOSCOPY (EGD) WITH PROPOFOL  N/A 05/03/2014   Procedure: ESOPHAGOGASTRODUODENOSCOPY (EGD) WITH PROPOFOL ;  Surgeon: Weiss KYM Sol, MD;  Location: [facility] ENDOSCOPY;  Service: Endoscopy;  Laterality: N/A;   HERNIA REPAIR     INCISIONAL HERNIA REPAIR N/A 02/20/2015   Procedure: LAPAROSCOPIC INCISIONAL HERNIA;  Surgeon: Dann Hummer, MD;  Location: [facility] OR;  Service: General;  Laterality: N/A;   INSERTION OF MESH N/A 02/20/2015   Procedure: INSERTION OF MESH;  Surgeon: Dann Hummer, MD;  Location: [facility] OR;  Service: General;  Laterality: N/A;   LAPAROSCOPIC INCISIONAL / UMBILICAL / VENTRAL HERNIA REPAIR  02/20/2015   IHR w/mesh & LOA   LAPAROTOMY N/A 05/07/2014   Procedure: EXPLORATORY LAPAROTOMY;  Surgeon: Dann Hummer, MD;  Location: [facility] OR;  Service: General;  Laterality: N/A;   LITHOTRIPSY  2004   NASAL SINUS SURGERY  05/17/2008   removed bone spurs in my sinus cavity   REPAIR OF PERFORATED ULCER N/A 05/07/2014   Procedure: OVERSOWE DUODENAL ULCER;  Surgeon: Dann Hummer, MD;  Location: [facility] OR;  Service: General;  Laterality: N/A;   TONSILLECTOMY  05/17/2008    Family History  Problem Relation Age of Onset   Heart Problems Mother    Heart Problems Father    Migraines Sister    Seizures Brother    Migraines Brother     Social History   Socioeconomic History   Marital status: Widowed    Spouse name: Not on file   Number of children: Not on file   Years of education: Not on file   Highest education level: Not on file  Occupational History   Not on file  Tobacco Use   Smoking status: Every Day    Current packs/day: 1.50     Average packs/day: 1.5 packs/day for 20.0 years (30.0 ttl pk-yrs)    Types: Cigarettes   Smokeless tobacco: Never  Vaping Use   Vaping status: Never Used  Substance and Sexual Activity   Alcohol use: No   Drug use: No   Sexual activity: Not Currently    Birth control/protection: None  Other Topics Concern   Not on file  Social History Narrative   Are you right handed or left handed? left   Are you currently employed ? NO    What is your current occupation?   Do you live at home alone?   Who lives with you?    What type of home do you live in: 1 story or 2 story? 1       Social Drivers of Corporate investment banker Strain: Not on file  Food Insecurity: Not on file  Transportation Needs: Not on file  Physical  Activity: Not on file  Stress: Not on file  Social Connections: Not on file  Intimate Partner Violence: Not on file    Review of Systems  Constitutional:  Negative for chills, fever and malaise/fatigue.  Eyes:  Negative for blurred vision and double vision.  Respiratory:  Negative for cough and shortness of breath.   Cardiovascular:  Negative for chest pain and palpitations.  Musculoskeletal:  Positive for back pain and joint pain. Negative for myalgias.  Neurological:  Negative for dizziness and headaches.  Psychiatric/Behavioral:  Negative for depression. The patient is nervous/anxious and has insomnia.         Objective    BP 115/84   Pulse 87   Temp 98.2 F (36.8 C)   Ht 5' 3 (1.6 m)   Wt 165 lb (74.8 kg)   SpO2 98%   BMI 29.23 kg/m   Physical Exam Constitutional:      Appearance: Normal appearance.  HENT:     Head: Normocephalic.     Mouth/Throat:     Mouth: Mucous membranes are moist.     Pharynx: Oropharynx is clear.  Eyes:     Extraocular Movements: Extraocular movements intact.     Conjunctiva/sclera: Conjunctivae normal.  Cardiovascular:     Rate and Rhythm: Normal rate and regular rhythm.     Heart sounds: Normal heart sounds. No  murmur heard. Pulmonary:     Effort: Pulmonary effort is normal.     Breath sounds: Normal breath sounds. No wheezing or rales.  Skin:    General: Skin is warm and dry.  Neurological:     General: No focal deficit present.     Mental Status: She is alert and oriented to person, place, and time.  Psychiatric:        Mood and Affect: Mood normal.        Behavior: Behavior normal.        Assessment & Plan:  Other specified hypothyroidism Assessment & Plan: Long-standing hypothyroidism managed with Synthroid . Current dose has been effective, denies current symptoms. - Order thyroid  function tests. - Review thyroid  function tests at follow-up. - Continue Synthroid , will adjust dose as indicated.   Orders: -     TSH + free T4  Chronic pain syndrome -     Ambulatory referral to Pain Clinic  Generalized anxiety disorder Assessment & Plan: Current treatment with Cymbalta  is ineffective for anxiety. Discussed Buspar  as a non-addictive alternative with no increased risk with pain medications. - Initiate Buspar  at 7.5 mg BID for six weeks. - Schedule follow-up in six weeks to assess response to Buspar . - Evaluated potential interactions of Buspar  with current medications. - Patient counseled on side effects such as nausea and headache, she was advised to stop taking medication if she experiences suicidal ideation.    Orders: -     busPIRone  HCl; Take 1 tablet (7.5 mg total) by mouth 2 (two) times daily.  Dispense: 60 tablet; Refill: 1  Tobacco abuse Assessment & Plan: Goal to quit smoking in September. Smokes one pack per day. Not interested in Chantix due to adverse experiences observed in others. - Advise use of over-the-counter nicotine  patches or gum. - Check insurance coverage for smoking cessation aids. - Encourage setting a quit date in September.   Screening for lipid disorders -     Lipid panel  Leukocytosis, unspecified type -     CBC with  Differential/Platelet  Hypocalcemia -     CMP14+EGFR    Return in  about 6 weeks (around 12/08/2023) for anxiety.   Charmaine Zoey Bidwell, PA-C

## 2023-10-27 NOTE — Assessment & Plan Note (Signed)
 Goal to quit smoking in September. Smokes one pack per day. Not interested in Chantix due to adverse experiences observed in others. - Advise use of over-the-counter nicotine  patches or gum. - Check insurance coverage for smoking cessation aids. - Encourage setting a quit date in September.

## 2023-10-27 NOTE — Assessment & Plan Note (Signed)
 Long-standing hypothyroidism managed with Synthroid . Current dose has been effective, denies current symptoms. - Order thyroid  function tests. - Review thyroid  function tests at follow-up. - Continue Synthroid , will adjust dose as indicated.

## 2023-11-04 ENCOUNTER — Telehealth: Payer: Self-pay

## 2023-11-04 NOTE — Telephone Encounter (Signed)
 Copied from CRM #[id number]. Topic: General - Other >> Nov 04, 2023 12:20 PM Avram MATSU wrote: Reason for CRM: patient want to let the provider know she will be doing labs on 9/8 with [facility] on [address]. She stated she has not forgotten but was not able to wait during her original walk in.

## 2023-11-04 NOTE — Telephone Encounter (Signed)
 Kimberly Weiss with [facility]

## 2023-11-08 LAB — CBC WITH DIFFERENTIAL/PLATELET
Basophils Absolute: 0.1 x10E3/uL (ref 0.0–0.2)
Basos: 0 %
EOS (ABSOLUTE): 0.4 x10E3/uL (ref 0.0–0.4)
Eos: 3 %
Hematocrit: 45 % (ref 34.0–46.6)
Hemoglobin: 14.6 g/dL (ref 11.1–15.9)
Immature Grans (Abs): 0 x10E3/uL (ref 0.0–0.1)
Immature Granulocytes: 0 %
Lymphocytes Absolute: 3.7 x10E3/uL — ABNORMAL HIGH (ref 0.7–3.1)
Lymphs: 28 %
MCH: 29.2 pg (ref 26.6–33.0)
MCHC: 32.4 g/dL (ref 31.5–35.7)
MCV: 90 fL (ref 79–97)
Monocytes Absolute: 0.7 x10E3/uL (ref 0.1–0.9)
Monocytes: 5 %
Neutrophils Absolute: 8.4 x10E3/uL — ABNORMAL HIGH (ref 1.4–7.0)
Neutrophils: 64 %
Platelets: 425 x10E3/uL (ref 150–450)
RBC: 5 x10E6/uL (ref 3.77–5.28)
RDW: 14.8 % (ref 11.7–15.4)
WBC: 13.3 x10E3/uL — ABNORMAL HIGH (ref 3.4–10.8)

## 2023-11-08 LAB — CMP14+EGFR
ALT: 14 IU/L (ref 0–32)
AST: 17 IU/L (ref 0–40)
Albumin: 4.2 g/dL (ref 3.9–4.9)
Alkaline Phosphatase: 92 IU/L (ref 44–121)
BUN/Creatinine Ratio: 8 — ABNORMAL LOW (ref 9–23)
BUN: 7 mg/dL (ref 6–24)
Bilirubin Total: 0.2 mg/dL (ref 0.0–1.2)
CO2: 21 mmol/L (ref 20–29)
Calcium: 9.8 mg/dL (ref 8.7–10.2)
Chloride: 101 mmol/L (ref 96–106)
Creatinine, Ser: 0.83 mg/dL (ref 0.57–1.00)
Globulin, Total: 2.5 g/dL (ref 1.5–4.5)
Glucose: 95 mg/dL (ref 70–99)
Potassium: 4.4 mmol/L (ref 3.5–5.2)
Sodium: 138 mmol/L (ref 134–144)
Total Protein: 6.7 g/dL (ref 6.0–8.5)
eGFR: 89 mL/min/1.73 (ref >59)

## 2023-11-08 LAB — TSH+FREE T4
Free T4: 1.44 ng/dL (ref 0.82–1.77)
TSH: 1.28 u[IU]/mL (ref 0.450–4.500)

## 2023-11-08 LAB — LIPID PANEL
Chol/HDL Ratio: 8.9 ratio — ABNORMAL HIGH (ref 0.0–4.4)
Cholesterol, Total: 259 mg/dL — ABNORMAL HIGH (ref 100–199)
HDL: 29 mg/dL — ABNORMAL LOW (ref >39)
LDL Chol Calc (NIH): 164 mg/dL — ABNORMAL HIGH (ref 0–99)
Triglycerides: 347 mg/dL — ABNORMAL HIGH (ref 0–149)
VLDL Cholesterol Cal: 66 mg/dL — ABNORMAL HIGH (ref 5–40)

## 2023-11-10 ENCOUNTER — Other Ambulatory Visit: Payer: Self-pay | Admitting: Physician Assistant

## 2023-11-10 ENCOUNTER — Ambulatory Visit: Payer: Self-pay | Admitting: Physician Assistant

## 2023-11-10 DIAGNOSIS — E782 Mixed hyperlipidemia: Secondary | ICD-10-CM

## 2023-11-10 MED ORDER — ROSUVASTATIN CALCIUM 20 MG PO TABS: 20.0000 mg | ORAL_TABLET | Freq: Every day | ORAL | 3 refills | Status: AC

## 2023-12-08 ENCOUNTER — Ambulatory Visit: Admitting: Physician Assistant

## 2023-12-13 ENCOUNTER — Ambulatory Visit: Admitting: Physician Assistant

## 2023-12-14 ENCOUNTER — Encounter: Payer: Self-pay | Admitting: Physician Assistant

## 2023-12-14 ENCOUNTER — Ambulatory Visit: Admitting: Physician Assistant

## 2023-12-14 VITALS — BP 111/74 | HR 100 | Resp 16 | Ht 63.0 in | Wt 158.1 lb

## 2023-12-14 DIAGNOSIS — F411 Generalized anxiety disorder: Secondary | ICD-10-CM | POA: Diagnosis not present

## 2023-12-14 DIAGNOSIS — J0140 Acute pansinusitis, unspecified: Secondary | ICD-10-CM | POA: Diagnosis not present

## 2023-12-14 MED ORDER — HYDROXYZINE PAMOATE 25 MG PO CAPS: 25.0000 mg | ORAL_CAPSULE | Freq: Three times a day (TID) | ORAL | 0 refills | Status: AC | PRN

## 2023-12-14 MED ORDER — HYDROCHLOROTHIAZIDE 25 MG PO TABS: ORAL_TABLET | ORAL | 0 refills | Status: DC

## 2023-12-14 MED ORDER — BUSPIRONE HCL 15 MG PO TABS: 15.0000 mg | ORAL_TABLET | Freq: Two times a day (BID) | ORAL | 1 refills | Status: AC

## 2023-12-14 MED ORDER — AMOXICILLIN-POT CLAVULANATE 875-125 MG PO TABS: 1.0000 | ORAL_TABLET | Freq: Two times a day (BID) | ORAL | 0 refills | Status: AC

## 2023-12-14 NOTE — Assessment & Plan Note (Signed)
 Patient presents today for follow up regarding anxiety. She reports 20-30% improvement with current medication, difficulty staying asleep, reassured about medication safety, no self-harm thoughts. - Increase Buspirone  to 15 mg twice daily. - Prescribe hydroxyzine at bedtime as needed for anxiety and sleep. - follow up in 3 months or sooner if anxiety or insomnia worsen or do not improve.

## 2023-12-14 NOTE — Addendum Note (Signed)
 Addended by: MANCIL PFEIFFER on: 12/14/2023 03:15 PM   Modules accepted: Orders

## 2023-12-14 NOTE — Progress Notes (Addendum)
 Established Patient Office Visit  Subjective   Patient ID: Kimberly Weiss, female    DOB: 1980/03/28  Age: 43 y.o. MRN: [id number]  Chief Complaint  Patient presents with   Cough    Tightness in chest and some cough x 1.5 weeks. Just recently lost her dad    Discussed the use of AI scribe software for clinical note transcription with the patient, who gave verbal consent to proceed.  History of Present Illness Kimberly Weiss is a 43 year old female who presents with cough, chest tightness, and sinus pain.  She has experienced cough and chest tightness for about a week and a half. The chest tightness is similar to bronchitis, and her inhaler provides some relief, allowing expectoration of yellowish-brown sputum, but does not alleviate the tightness and pain. She has significant sinus pain, described as feeling like wearing a mask, and suspects a sinus infection, which she frequently experiences due to allergies to environmental factors and pets. She uses Excedrin for symptom relief but is cautious about additional medications. She is on Buspar  for anxiety, with a 20-30% improvement in symptoms, but struggles with staying asleep. She reports her dad passed away recently on 10-31-2025causing significant emotional distress. She has allergies to environmental factors and pets and owns cats and dogs. No thoughts of self-harm or harm to others.   Review of Systems  Constitutional:  Positive for malaise/fatigue. Negative for fever and weight loss.  HENT:  Positive for congestion and sinus pain. Negative for ear discharge, ear pain and sore throat.   Eyes:  Negative for pain and discharge.  Respiratory:  Positive for cough and sputum production. Negative for shortness of breath and wheezing.   Cardiovascular:  Positive for chest pain (tightness).  Musculoskeletal:  Positive for back pain and neck pain.  Neurological:  Positive for headaches. Negative for dizziness.  Psychiatric/Behavioral:   The patient is nervous/anxious (improving) and has insomnia.       Objective:     BP 111/74   Pulse 100   Resp 16   Ht 5' 3 (1.6 m)   Wt 158 lb 1.9 oz (71.7 kg)   SpO2 98%   BMI 28.01 kg/m    Physical Exam Vitals reviewed.  Constitutional:      General: She is not in acute distress.    Appearance: Normal appearance. She is ill-appearing.  HENT:     Right Ear: Tympanic membrane normal.     Left Ear: Tympanic membrane normal.     Nose: Congestion and rhinorrhea present.     Right Sinus: Maxillary sinus tenderness and frontal sinus tenderness present.     Left Sinus: Maxillary sinus tenderness and frontal sinus tenderness present.     Mouth/Throat:     Mouth: Mucous membranes are moist.     Pharynx: Oropharynx is clear. Posterior oropharyngeal erythema present.  Eyes:     Extraocular Movements: Extraocular movements intact.     Conjunctiva/sclera: Conjunctivae normal.  Cardiovascular:     Rate and Rhythm: Regular rhythm. Tachycardia present.     Heart sounds: Normal heart sounds. No murmur heard. Pulmonary:     Effort: Pulmonary effort is normal.     Breath sounds: Normal breath sounds. No wheezing, rhonchi or rales.  Skin:    General: Skin is warm and dry.  Neurological:     General: No focal deficit present.     Mental Status: She is alert and oriented to person, place, and time.  Psychiatric:  Mood and Affect: Mood normal. Affect is tearful.        Behavior: Behavior normal.      No results found for any visits on 12/14/23.  The 10-year ASCVD risk score (Arnett DK, et al., 2019) is: 11.7%    Assessment & Plan:   Return in about 3 months (around 03/15/2024) for anxiety .   Acute non-recurrent pansinusitis Assessment & Plan: Presentation was consistent with sinusitis.  No evidence of other bacterial infections including pneumonia, pharyngitis, otitis media, or orbital cellulitis. Discussed that this fits the picture of viral vs bacterial sinusitis  and that due to type and duration of symptoms and exam findings, we will treat as bacterial sinusitis.  Antibiotics prescribed. Advised to continue ibuprofen  and Tylenol  at home. The patient was instructed to return if the worsens in any way, especially if not tolerating fluids, increased sinus pain or swelling, worsening headache, persistent fever, difficulty swallowing or breathing, or as needed. The patient agreed with the plan.    Orders: -     Amoxicillin -Pot Clavulanate; Take 1 tablet by mouth 2 (two) times daily for 7 days.  Dispense: 14 tablet; Refill: 0  Generalized anxiety disorder Assessment & Plan: Patient presents today for follow up regarding anxiety. She reports 20-30% improvement with current medication, difficulty staying asleep, reassured about medication safety, no self-harm thoughts. - Increase Buspirone  to 15 mg twice daily. - Prescribe hydroxyzine at bedtime as needed for anxiety and sleep. - follow up in 3 months or sooner if anxiety or insomnia worsen or do not improve.   Orders: -     hydrOXYzine Pamoate; Take 1 capsule (25 mg total) by mouth every 8 (eight) hours as needed.  Dispense: 30 capsule; Refill: 0 -     busPIRone  HCl; Take 1 tablet (15 mg total) by mouth 2 (two) times daily.  Dispense: 180 tablet; Refill: 1  Other orders -     hydroCHLOROthiazide ; TAKE 1/2 TO 1 TABLETS BY MOUTH EVERY MORNING AS NEEDED FOR SWELLING  Dispense: 90 tablet; Refill: 0  Leitha Hyppolite, PA-C

## 2023-12-14 NOTE — Assessment & Plan Note (Signed)
 Presentation was consistent with sinusitis.  No evidence of other bacterial infections including pneumonia, pharyngitis, otitis media, or orbital cellulitis. Discussed that this fits the picture of viral vs bacterial sinusitis and that due to type and duration of symptoms and exam findings, we will treat as bacterial sinusitis.  Antibiotics prescribed. Advised to continue ibuprofen  and Tylenol  at home. The patient was instructed to return if the worsens in any way, especially if not tolerating fluids, increased sinus pain or swelling, worsening headache, persistent fever, difficulty swallowing or breathing, or as needed. The patient agreed with the plan.

## 2024-02-13 ENCOUNTER — Encounter: Payer: Self-pay | Admitting: Family Medicine

## 2024-03-05 ENCOUNTER — Encounter: Payer: Self-pay | Admitting: Physician Assistant

## 2024-03-05 ENCOUNTER — Other Ambulatory Visit: Payer: Self-pay | Admitting: Physician Assistant

## 2024-03-05 MED ORDER — LEVOTHYROXINE SODIUM 100 MCG PO TABS: 100.0000 ug | ORAL_TABLET | Freq: Every day | ORAL | 1 refills | Status: AC

## 2024-03-08 ENCOUNTER — Other Ambulatory Visit: Payer: Self-pay

## 2024-03-08 ENCOUNTER — Encounter: Payer: Self-pay | Admitting: Physician Assistant

## 2024-03-08 MED ORDER — AMITRIPTYLINE HCL 50 MG PO TABS: ORAL_TABLET | ORAL | 0 refills | Status: AC

## 2024-03-15 ENCOUNTER — Ambulatory Visit: Admitting: Physician Assistant

## 2024-03-15 ENCOUNTER — Encounter: Payer: Self-pay | Admitting: Physician Assistant

## 2024-03-15 VITALS — BP 107/79 | HR 115 | Temp 98.2°F | Ht 63.0 in | Wt 161.6 lb

## 2024-03-15 DIAGNOSIS — S4991XA Unspecified injury of right shoulder and upper arm, initial encounter: Secondary | ICD-10-CM

## 2024-03-15 DIAGNOSIS — G894 Chronic pain syndrome: Secondary | ICD-10-CM | POA: Diagnosis not present

## 2024-03-15 MED ORDER — PROMETHAZINE HCL 25 MG PO TABS: 25.0000 mg | ORAL_TABLET | Freq: Three times a day (TID) | ORAL | 1 refills | Status: AC | PRN

## 2024-03-15 MED ORDER — DULOXETINE HCL 60 MG PO CPEP: ORAL_CAPSULE | ORAL | 1 refills | Status: AC

## 2024-03-15 MED ORDER — DICLOFENAC SODIUM 75 MG PO TBEC: 75.0000 mg | DELAYED_RELEASE_TABLET | Freq: Two times a day (BID) | ORAL | 0 refills | Status: DC

## 2024-03-15 MED ORDER — DICLOFENAC SODIUM 75 MG PO TBEC: 75.0000 mg | DELAYED_RELEASE_TABLET | Freq: Two times a day (BID) | ORAL | 0 refills | Status: AC

## 2024-03-15 NOTE — Progress Notes (Signed)
 "  Established Patient Office Visit  Subjective   Patient ID: Kimberly Weiss, female    DOB: 07-25-1980  Age: 44 y.o. MRN: [id number]  Chief Complaint  Patient presents with   Anxiety    Pt says it has worsened due to shoulder injury PT also stated her father passed away    Shoulder Injury    PT claims she hurt her shoulder dec. 23rd and is in extreme pain since - waiting on ortho to call   Medication Problem    PT says the oxy 10-325 messes with her liver - Can I switch back to just the 10?    Discussed the use of AI scribe software for clinical note transcription with the patient, who gave verbal consent to proceed.  History of Present Illness Kimberly Weiss is a 44 year old female who presents with shoulder pain following an injury.  She injured herself on December 23rd while lifting a heavy 33-gallon trash bag, resulting in significant pain from her neck to the bottom of her spine, with particular concern about her shoulder. She reports that her pain doctor did x-rays on her shoulder and informed her that there were cysts and narrowing, and that there may be a torn rotator cuff. She is awaiting an MRI for further evaluation. Relates she has been referred to orthopedics but does not have an appointment scheduled.  She received a Toradol  and steroid shot on Friday, which provided temporary relief until Monday. She is unable to sleep on her back due to acid reflux and cannot sleep on her stomach due to the shoulder pain. The pain is impacting her daily activities, and she is seeking additional pain management options.  She is concerned about the acetaminophen  content in her oxycodone  prescription due to potential liver damage and interested in resuming just oxycodone  10 mg.   Her father passed away in 2025/01/01, which has been a major source of emotional distress. She is also concerned about developing a sinus infection, noting recent onset of cold symptoms.    Review of  Systems  Constitutional:  Negative for activity change, appetite change, fatigue and fever.  Respiratory:  Negative for cough and shortness of breath.   Cardiovascular:  Negative for chest pain.  Musculoskeletal:  Positive for arthralgias, back pain and neck pain. Negative for gait problem, joint swelling and neck stiffness.  Neurological:  Negative for light-headedness and headaches.  Psychiatric/Behavioral:  Negative for agitation and decreased concentration. The patient is nervous/anxious.        Objective:     BP 107/79   Pulse (!) 115   Temp 98.2 F (36.8 C)   Ht 5' 3 (1.6 m)   Wt 161 lb 9.6 oz (73.3 kg)   SpO2 99%   BMI 28.63 kg/m    Physical Exam Constitutional:      General: She is not in acute distress.    Appearance: Normal appearance. She is not ill-appearing.  HENT:     Head: Normocephalic and atraumatic.     Mouth/Throat:     Mouth: Mucous membranes are moist.     Pharynx: Oropharynx is clear.  Eyes:     Extraocular Movements: Extraocular movements intact.     Conjunctiva/sclera: Conjunctivae normal.  Cardiovascular:     Rate and Rhythm: Normal rate and regular rhythm.     Heart sounds: Normal heart sounds. No murmur heard. Pulmonary:     Effort: Pulmonary effort is normal.     Breath sounds: Normal breath  sounds.  Musculoskeletal:        General: Signs of injury present.     Right shoulder: Swelling, deformity, tenderness and bony tenderness present. Decreased range of motion. Abnormal pulse.     Right lower leg: No edema.     Left lower leg: No edema.  Skin:    General: Skin is warm and dry.  Neurological:     General: No focal deficit present.     Mental Status: She is alert and oriented to person, place, and time.  Psychiatric:        Mood and Affect: Mood normal.        Behavior: Behavior normal.      No results found for any visits on 03/15/24.  The 10-year ASCVD risk score (Arnett DK, et al., 2019) is: 11%    Assessment & Plan:    Return in about 6 months (around 09/12/2024) for phys with Pap .   Injury of right shoulder, initial encounter Assessment & Plan: Recent right shoulder injury with possible rotator cuff tear. Pain exacerbated by lifting. X-rays done with pain management show joint space narrowing. MRI pending. Recent Toradol  injection provided temporary relief. Unable to sleep on stomach or back due to pain and acid reflux. Visibly uncomfortable on exam. - Provided information for [facility] and [facility] urgent care hours if patient wishes to be assessed sooner than referral appointment.  - Prescribed oral anti-inflammatory medication for pain management. - Advised use of a sling for comfort.  Orders: -     Diclofenac  Sodium; Take 1 tablet (75 mg total) by mouth 2 (two) times daily.  Dispense: 30 tablet; Refill: 0  Chronic pain syndrome Assessment & Plan: Management complicated by recent shoulder injury. Current pain management includes Percocet. Concerns about liver health due to medication use. Dissatisfaction with current pain management provider. - Denies referral for a new pain management provider states she has someone lined up - Prescribed oral anti-inflammatory medication to complement current pain management regimen.   Other orders -     DULoxetine  HCl; TAKE 1 CAPSULE BY MOUTH EVERY DAY  Dispense: 90 capsule; Refill: 1 -     Promethazine  HCl; Take 1 tablet (25 mg total) by mouth every 8 (eight) hours as needed.  Dispense: 20 tablet; Refill: 1    Firas Guardado, PA-C "

## 2024-03-15 NOTE — Assessment & Plan Note (Signed)
 Management complicated by recent shoulder injury. Current pain management includes Percocet. Concerns about liver health due to medication use. Dissatisfaction with current pain management provider. - Denies referral for a new pain management provider states she has someone lined up - Prescribed oral anti-inflammatory medication to complement current pain management regimen.

## 2024-03-15 NOTE — Patient Instructions (Addendum)
 [facility]- [address], [place], [place] [address]- 9am-5pm  [facility]- [address], [place], [place]- 5:30pm-9pm

## 2024-03-15 NOTE — Assessment & Plan Note (Signed)
 Recent right shoulder injury with possible rotator cuff tear. Pain exacerbated by lifting. X-rays done with pain management show joint space narrowing. MRI pending. Recent Toradol  injection provided temporary relief. Unable to sleep on stomach or back due to pain and acid reflux. Visibly uncomfortable on exam. - Provided information for [facility] and [facility] urgent care hours if patient wishes to be assessed sooner than referral appointment.  - Prescribed oral anti-inflammatory medication for pain management. - Advised use of a sling for comfort.

## 2024-04-05 ENCOUNTER — Encounter: Payer: Self-pay | Admitting: Physician Assistant

## 2024-04-06 ENCOUNTER — Other Ambulatory Visit: Payer: Self-pay

## 2024-04-06 MED ORDER — HYDROCHLOROTHIAZIDE 25 MG PO TABS: ORAL_TABLET | ORAL | 0 refills | Status: AC

## 2024-11-13 ENCOUNTER — Encounter: Admitting: Physician Assistant
